# Patient Record
Sex: Male | Born: 1990 | Race: White | Hispanic: No | Marital: Married | Smoking: Former smoker
Health system: Southern US, Community
[De-identification: ages and names within clinical notes are randomized; demographics above are authoritative.]

## PROBLEM LIST (undated history)

## (undated) DIAGNOSIS — S82201B Unspecified fracture of shaft of right tibia, initial encounter for open fracture type I or II: Secondary | ICD-10-CM

## (undated) DIAGNOSIS — Z9289 Personal history of other medical treatment: Secondary | ICD-10-CM

## (undated) DIAGNOSIS — R569 Unspecified convulsions: Secondary | ICD-10-CM

## (undated) DIAGNOSIS — S7222XA Displaced subtrochanteric fracture of left femur, initial encounter for closed fracture: Secondary | ICD-10-CM

## (undated) DIAGNOSIS — E039 Hypothyroidism, unspecified: Secondary | ICD-10-CM

## (undated) DIAGNOSIS — S82202C Unspecified fracture of shaft of left tibia, initial encounter for open fracture type IIIA, IIIB, or IIIC: Secondary | ICD-10-CM

## (undated) HISTORY — DX: Hypothyroidism, unspecified: E03.9

---
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
## 2010-10-01 NOTE — Procedures (Signed)
PROCEDURE NUMBER:  [id number]   REFERRING PHYSICIAN:  Francoise Schaumann. Halm, DO, FAAP   CLINICAL HISTORY:  This is a routine EEG with photic stimulation and  hyperventilation.  The patient describes awake and drowsy period.  This  is 20 year old man with a single generalized seizure.  EEG is performed  for evaluation.   DESCRIPTION:  The dominant rhythm of this tracing is a low to moderate  amplitude alpha rhythm of 11 Hz which predominates posteriorly, appears  without abnormal asymmetry, and attenuates with eye opening and closing.  Low amplitude fast activity is seen frontally and centrally and appears  without abnormal asymmetry.  No focal slowing is noted and no  epileptiform discharges seen.  Drowsiness occurs naturally as evidenced  by fragmentation in the background and general attenuation of rhythms.  Stage II sleep is not achieved.  No abnormalities seen in drowsiness.  Photic stimulation produced symmetric driver responses best seen at 11  Hz.  Hyperventilation produced slowing and bill over the background  rhythms without appearance of epileptiform abnormalities.  Single-  channel devoted EKG revealed sinus rhythm throughout with a rate of  approximately 72 beats per minute.   CONCLUSIONS:  Normal study in the awake and drowsy states.      Michael L. Thad Ranger, M.D.  Electronically Signed     ZOX:WRUE  D:  07/01/2007 19:56:18  T:  07/03/2007 12:24:16  Job #:  [id number]

## 2012-06-06 ENCOUNTER — Encounter (HOSPITAL_COMMUNITY): Payer: Self-pay | Admitting: *Deleted

## 2012-06-06 ENCOUNTER — Emergency Department (HOSPITAL_COMMUNITY)
Admission: EM | Admit: 2012-06-06 | Discharge: 2012-06-06 | Disposition: A | Discharge status: Home / Self Care | Payer: Self-pay | Attending: Emergency Medicine | Admitting: Emergency Medicine

## 2012-06-06 ENCOUNTER — Other Ambulatory Visit (HOSPITAL_COMMUNITY): Payer: Self-pay

## 2012-06-06 ENCOUNTER — Emergency Department (HOSPITAL_COMMUNITY): Payer: Self-pay

## 2012-06-06 DIAGNOSIS — F17200 Nicotine dependence, unspecified, uncomplicated: Secondary | ICD-10-CM | POA: Insufficient documentation

## 2012-06-06 DIAGNOSIS — R55 Syncope and collapse: Secondary | ICD-10-CM | POA: Insufficient documentation

## 2012-06-06 DIAGNOSIS — G40909 Epilepsy, unspecified, not intractable, without status epilepticus: Secondary | ICD-10-CM | POA: Insufficient documentation

## 2012-06-06 HISTORY — DX: Unspecified convulsions: R56.9

## 2012-06-06 LAB — URINALYSIS, ROUTINE W REFLEX MICROSCOPIC
Bilirubin Urine: NEGATIVE
Glucose, UA: NEGATIVE mg/dL
Hgb urine dipstick: NEGATIVE
Ketones, ur: NEGATIVE mg/dL
Leukocytes, UA: NEGATIVE
Nitrite: NEGATIVE
Protein, ur: NEGATIVE mg/dL
Specific Gravity, Urine: 1.01 (ref 1.005–1.030)
Urobilinogen, UA: 0.2 mg/dL (ref 0.0–1.0)
pH: 5.5 (ref 5.0–8.0)

## 2012-06-06 LAB — BASIC METABOLIC PANEL
BUN: 11 mg/dL (ref 6–23)
CO2: 29 mEq/L (ref 19–32)
Calcium: 10 mg/dL (ref 8.4–10.5)
Chloride: 104 mEq/L (ref 96–112)
Creatinine, Ser: 0.95 mg/dL (ref 0.50–1.35)
GFR calc Af Amer: >90 mL/min (ref >90)
GFR calc non Af Amer: >90 mL/min (ref >90)
Glucose, Bld: 106 mg/dL — ABNORMAL HIGH (ref 70–99)
Potassium: 4 mEq/L (ref 3.5–5.1)
Sodium: 139 mEq/L (ref 135–145)

## 2012-06-06 LAB — CBC WITH DIFFERENTIAL/PLATELET
Basophils Absolute: 0 10*3/uL (ref 0.0–0.1)
Basophils Relative: 1 % (ref 0–1)
Eosinophils Absolute: 0.1 10*3/uL (ref 0.0–0.7)
Eosinophils Relative: 1 % (ref 0–5)
HCT: 40.5 % (ref 39.0–52.0)
Hemoglobin: 14.1 g/dL (ref 13.0–17.0)
Lymphocytes Relative: 27 % (ref 12–46)
Lymphs Abs: 1.2 10*3/uL (ref 0.7–4.0)
MCH: 30.1 pg (ref 26.0–34.0)
MCHC: 34.8 g/dL (ref 30.0–36.0)
MCV: 86.5 fL (ref 78.0–100.0)
Monocytes Absolute: 0.4 10*3/uL (ref 0.1–1.0)
Monocytes Relative: 9 % (ref 3–12)
Neutro Abs: 2.7 10*3/uL (ref 1.7–7.7)
Neutrophils Relative %: 62 % (ref 43–77)
Platelets: 166 10*3/uL (ref 150–400)
RBC: 4.68 MIL/uL (ref 4.22–5.81)
RDW: 12.5 % (ref 11.5–15.5)
WBC: 4.3 10*3/uL (ref 4.0–10.5)

## 2012-06-06 LAB — RAPID URINE DRUG SCREEN, HOSP PERFORMED
Amphetamines: NOT DETECTED
Barbiturates: NOT DETECTED
Benzodiazepines: NOT DETECTED
Cocaine: NOT DETECTED
Opiates: NOT DETECTED
Tetrahydrocannabinol: POSITIVE — AB

## 2012-06-06 LAB — D-DIMER, QUANTITATIVE (NOT AT ARMC): D-Dimer, Quant: <0.27 ug/mL-FEU (ref 0.00–0.48)

## 2012-06-06 LAB — TROPONIN I: Troponin I: <0.3 ng/mL (ref <0.30)

## 2012-06-06 NOTE — ED Provider Notes (Signed)
History     CSN: [id number]  Arrival date & time 06/06/12  1520   First MD Initiated Contact with Patient 06/06/12 1609      Chief Complaint  Patient presents with  . Near Syncope     HPI Pt was seen at 1635.   Per pt, c/o sudden onset and resolution of one brief episode of near syncope, followed by one brief episode of syncope early this morning approx 0200.  Pt states his symptoms occurred after he was "playing video games for a while."  States he initially felt "tired" and decided he needed to go to bed.  When arrived to the bedroom, states he "just fell down" on the bed and "heard my girlfriend talking to me" but states he "couldn't respond." Recalls all events of this episode.  Girlfriend states she helped him sit up on the floor and was talking to him when he "passed out again."  Pt states he does not remember the events of the second episode. Pt awoke on his own, A&O but has "felt really weak all day."  Pt's girlfriend denies seizure activity, no confusion/AMS, no incont of bowel/bladder, no apnea/pulselessness, no headache, no fevers, no CP/palpitations, no abd pain, no N/V/D, no focal motor weakness, no tingling/numbness in extremities, no slurred speech, no facial droop, no visual changes, no recent head injury.     Past Medical History  Diagnosis Date  . Seizures     x 1 at age 34    History reviewed. No pertinent past surgical history.   History  Substance Use Topics  . Smoking status: Current Every Day Smoker    Types: Cigarettes  . Smokeless tobacco: Not on file  . Alcohol Use: No      Review of Systems ROS: Statement: All systems negative except as marked or noted in the HPI; Constitutional: Negative for fever and chills. +generalized weakness/fatigue. ; ; Eyes: Negative for eye pain, redness and discharge. ; ; ENMT: Negative for ear pain, hoarseness, nasal congestion, sinus pressure and sore throat. ; ; Cardiovascular: Negative for chest pain, palpitations,  diaphoresis, dyspnea and peripheral edema. ; ; Respiratory: Negative for cough, wheezing and stridor. ; ; Gastrointestinal: Negative for nausea, vomiting, diarrhea, abdominal pain, blood in stool, hematemesis, jaundice and rectal bleeding. . ; ; Genitourinary: Negative for dysuria, flank pain and hematuria. ; ; Musculoskeletal: Negative for back pain and neck pain. Negative for swelling and trauma.; ; Skin: Negative for pruritus, rash, abrasions, blisters, bruising and skin lesion.; ; Neuro: +lightheadedness, near syncope, syncope. Negative for headache and neck stiffness. Negative for altered level of consciousness , altered mental status, extremity weakness, paresthesias, involuntary movement, seizure.       Allergies  Review of patient's allergies indicates no known allergies.  Home Medications  No current outpatient prescriptions on file.  BP 131/81  Pulse 77  Temp 97.5 F (36.4 C) (Oral)  Resp 20  Ht 5\' 5"  (1.651 m)  Wt 148 lb (67.132 kg)  BMI 24.63 kg/m2  SpO2 100%  Physical Exam 1640: Physical examination:  Nursing notes reviewed; Vital signs and O2 SAT reviewed;  Constitutional: Well developed, Well nourished, Well hydrated, In no acute distress; Head:  Normocephalic, atraumatic; Eyes: EOMI, PERRL, No scleral icterus; ENMT: Mouth and pharynx normal, Mucous membranes moist; Neck: Supple, Full range of motion, No lymphadenopathy; Cardiovascular: Regular rate and rhythm, No murmur, rub, or gallop; Respiratory: Breath sounds clear & equal bilaterally, No rales, rhonchi, wheezes.  Speaking full sentences with ease, Normal  respiratory effort/excursion; Chest: Nontender, Movement normal; Abdomen: Soft, Nontender, Nondistended, Normal bowel sounds; Genitourinary: No CVA tenderness; Extremities: Pulses normal, No tenderness, No edema, No calf edema or asymmetry.; Neuro: AA&Ox3, Major CN grossly intact.  Strength 5/5 equal bilat UE's and LE's.  DTR 2/4 equal bilat UE's and LE's.  No gross  sensory deficits. Speech clear.  No facial droop.  No nystagmus. Climbs on and off stretcher easily by himself. Gait steady.;;; Skin: Color normal, Warm, Dry.   ED Course  Procedures   MDM  MDM Reviewed: nursing note and vitals Interpretation: labs, ECG, x-ray and CT scan    Date: 06/06/2012  Rate: 69  Rhythm: normal sinus rhythm and sinus arrhythmia  QRS Axis: normal  Intervals: normal  ST/T Wave abnormalities: normal  Conduction Disutrbances:nonspecific intraventricular conduction delay  Narrative Interpretation:   Old EKG Reviewed: none available.  Results for orders placed during the hospital encounter of 06/06/12  BASIC METABOLIC PANEL      Component Value Range   Sodium 139  135 - 145 mEq/L   Potassium 4.0  3.5 - 5.1 mEq/L   Chloride 104  96 - 112 mEq/L   CO2 29  19 - 32 mEq/L   Glucose, Bld 106 (*) 70 - 99 mg/dL   BUN 11  6 - 23 mg/dL   Creatinine, Ser 1.47  0.50 - 1.35 mg/dL   Calcium 82.9  8.4 - 56.2 mg/dL   GFR calc non Af Amer >90  >90 mL/min   GFR calc Af Amer >90  >90 mL/min  CBC WITH DIFFERENTIAL      Component Value Range   WBC 4.3  4.0 - 10.5 K/uL   RBC 4.68  4.22 - 5.81 MIL/uL   Hemoglobin 14.1  13.0 - 17.0 g/dL   HCT 13.0  86.5 - 78.4 %   MCV 86.5  78.0 - 100.0 fL   MCH 30.1  26.0 - 34.0 pg   MCHC 34.8  30.0 - 36.0 g/dL   RDW 69.6  29.5 - 28.4 %   Platelets 166  150 - 400 K/uL   Neutrophils Relative 62  43 - 77 %   Neutro Abs 2.7  1.7 - 7.7 K/uL   Lymphocytes Relative 27  12 - 46 %   Lymphs Abs 1.2  0.7 - 4.0 K/uL   Monocytes Relative 9  3 - 12 %   Monocytes Absolute 0.4  0.1 - 1.0 K/uL   Eosinophils Relative 1  0 - 5 %   Eosinophils Absolute 0.1  0.0 - 0.7 K/uL   Basophils Relative 1  0 - 1 %   Basophils Absolute 0.0  0.0 - 0.1 K/uL  URINALYSIS, ROUTINE W REFLEX MICROSCOPIC      Component Value Range   Color, Urine YELLOW  YELLOW   APPearance CLEAR  CLEAR   Specific Gravity, Urine 1.010  1.005 - 1.030   pH 5.5  5.0 - 8.0   Glucose, UA  NEGATIVE  NEGATIVE mg/dL   Hgb urine dipstick NEGATIVE  NEGATIVE   Bilirubin Urine NEGATIVE  NEGATIVE   Ketones, ur NEGATIVE  NEGATIVE mg/dL   Protein, ur NEGATIVE  NEGATIVE mg/dL   Urobilinogen, UA 0.2  0.0 - 1.0 mg/dL   Nitrite NEGATIVE  NEGATIVE   Leukocytes, UA NEGATIVE  NEGATIVE  URINE RAPID DRUG SCREEN (HOSP PERFORMED)      Component Value Range   Opiates NONE DETECTED  NONE DETECTED   Cocaine NONE DETECTED  NONE DETECTED  Benzodiazepines NONE DETECTED  NONE DETECTED   Amphetamines NONE DETECTED  NONE DETECTED   Tetrahydrocannabinol POSITIVE (*) NONE DETECTED   Barbiturates NONE DETECTED  NONE DETECTED  TROPONIN I      Component Value Range   Troponin I <0.30  <0.30 ng/mL  D-DIMER, QUANTITATIVE      Component Value Range   D-Dimer, Quant <0.27  0.00 - 0.48 ug/mL-FEU   Dg Chest 2 View 06/06/2012  *RADIOLOGY REPORT*  Clinical Data: Chest pain and lightheaded.  CHEST - 2 VIEW  Comparison: None  Findings: The cardiac silhouette, mediastinal and hilar contours are normal.  The lungs are clear.  No pleural effusion.  The bony thorax is intact.  IMPRESSION: No acute cardiopulmonary findings.   Original Report Authenticated By: Rudie Meyer, M.D.    Ct Head Wo Contrast 06/06/2012  *RADIOLOGY REPORT*  Clinical Data: Near syncope  CT HEAD WITHOUT CONTRAST  Technique:  Contiguous axial images were obtained from the base of the skull through the vertex without contrast.  Comparison: MR brain 06/24/2007  Findings: Normal gray-white differentiation.  Normal ventricular size.  Negative for hemorrhage, hydrocephalus, mass effect, mass lesion, or evidence of acute cortically based infarction.  Soft tissues of the scalp and orbits are unremarkable.  The visualized paranasal sinuses, mastoid air cells, and middle ears are clear.  The skull is intact.  IMPRESSION: Normal head CT.   Original Report Authenticated By: Britta Mccreedy, M.D.     1910:  Not orthostatic.  No acute findings on workup today.  Initial episode appears near-syncope, followed by syncope.  Pt does have hx of seizure when he was 22 yrs old, but never followed up.  Events occurred approx 0200 this morning after "playing on playstation" for an extended period of time. Cautioned regarding playing video games for an extended period of time without a break. Verb understanding.  Dx and testing d/w pt and family.  Questions answered.  Verb understanding, agreeable to d/c home with outpt f/u with Neuro MD.             Laray Anger, DO 06/08/12 1353

## 2012-06-06 NOTE — ED Notes (Signed)
Pt left without signing out. Pt had spoken with EDP. No scripts for pt. Pt did not wait for discharge instructions.

## 2012-06-06 NOTE — ED Notes (Signed)
Girlfriend reports pt was coming to bed last night when pt had a near syncopal episode, falling on bed then rolling to floor.  Girlfriend states pt's eyes were open, skin was pale, and hands were clammy.  States pt would not respond for approx 30 sec. Episode happened twice last night.  Pt c/o being weak today.  Alert and oriented x 4 at this time.

## 2012-06-08 LAB — URINE CULTURE
Colony Count: NO GROWTH
Culture: NO GROWTH

## 2012-07-29 ENCOUNTER — Emergency Department (HOSPITAL_COMMUNITY)
Admission: EM | Admit: 2012-07-29 | Discharge: 2012-07-29 | Disposition: A | Discharge status: Home / Self Care | Payer: Self-pay | Attending: Emergency Medicine | Admitting: Emergency Medicine

## 2012-07-29 ENCOUNTER — Encounter (HOSPITAL_COMMUNITY): Payer: Self-pay | Admitting: *Deleted

## 2012-07-29 ENCOUNTER — Emergency Department (HOSPITAL_COMMUNITY): Payer: Self-pay

## 2012-07-29 DIAGNOSIS — R42 Dizziness and giddiness: Secondary | ICD-10-CM | POA: Insufficient documentation

## 2012-07-29 DIAGNOSIS — R197 Diarrhea, unspecified: Secondary | ICD-10-CM | POA: Insufficient documentation

## 2012-07-29 DIAGNOSIS — R5381 Other malaise: Secondary | ICD-10-CM | POA: Insufficient documentation

## 2012-07-29 DIAGNOSIS — E860 Dehydration: Secondary | ICD-10-CM | POA: Insufficient documentation

## 2012-07-29 DIAGNOSIS — Z8669 Personal history of other diseases of the nervous system and sense organs: Secondary | ICD-10-CM | POA: Insufficient documentation

## 2012-07-29 DIAGNOSIS — F17200 Nicotine dependence, unspecified, uncomplicated: Secondary | ICD-10-CM | POA: Insufficient documentation

## 2012-07-29 DIAGNOSIS — R112 Nausea with vomiting, unspecified: Secondary | ICD-10-CM | POA: Insufficient documentation

## 2012-07-29 DIAGNOSIS — R51 Headache: Secondary | ICD-10-CM | POA: Insufficient documentation

## 2012-07-29 DIAGNOSIS — R5383 Other fatigue: Secondary | ICD-10-CM | POA: Insufficient documentation

## 2012-07-29 LAB — CBC WITH DIFFERENTIAL/PLATELET
Basophils Absolute: 0 10*3/uL (ref 0.0–0.1)
Basophils Relative: 1 % (ref 0–1)
Eosinophils Absolute: 0 10*3/uL (ref 0.0–0.7)
Eosinophils Relative: 1 % (ref 0–5)
HCT: 45.8 % (ref 39.0–52.0)
Hemoglobin: 16 g/dL (ref 13.0–17.0)
Lymphocytes Relative: 28 % (ref 12–46)
Lymphs Abs: 1.4 10*3/uL (ref 0.7–4.0)
MCH: 29.8 pg (ref 26.0–34.0)
MCHC: 34.9 g/dL (ref 30.0–36.0)
MCV: 85.3 fL (ref 78.0–100.0)
Monocytes Absolute: 0.3 10*3/uL (ref 0.1–1.0)
Monocytes Relative: 7 % (ref 3–12)
Neutro Abs: 3.2 10*3/uL (ref 1.7–7.7)
Neutrophils Relative %: 64 % (ref 43–77)
Platelets: 186 10*3/uL (ref 150–400)
RBC: 5.37 MIL/uL (ref 4.22–5.81)
RDW: 12.8 % (ref 11.5–15.5)
WBC: 5 10*3/uL (ref 4.0–10.5)

## 2012-07-29 LAB — COMPREHENSIVE METABOLIC PANEL
ALT: 9 U/L (ref 0–53)
AST: 13 U/L (ref 0–37)
Albumin: 4.7 g/dL (ref 3.5–5.2)
Alkaline Phosphatase: 72 U/L (ref 39–117)
BUN: 10 mg/dL (ref 6–23)
CO2: 28 mEq/L (ref 19–32)
Calcium: 10.3 mg/dL (ref 8.4–10.5)
Chloride: 102 mEq/L (ref 96–112)
Creatinine, Ser: 1.03 mg/dL (ref 0.50–1.35)
GFR calc Af Amer: >90 mL/min (ref >90)
GFR calc non Af Amer: >90 mL/min (ref >90)
Glucose, Bld: 95 mg/dL (ref 70–99)
Potassium: 4.2 mEq/L (ref 3.5–5.1)
Sodium: 142 mEq/L (ref 135–145)
Total Bilirubin: 0.8 mg/dL (ref 0.3–1.2)
Total Protein: 7.8 g/dL (ref 6.0–8.3)

## 2012-07-29 LAB — OCCULT BLOOD, POC DEVICE: Fecal Occult Bld: NEGATIVE

## 2012-07-29 LAB — LIPASE, BLOOD: Lipase: 17 U/L (ref 11–59)

## 2012-07-29 MED ORDER — ONDANSETRON HCL 4 MG/2ML IJ SOLN
4.0000 mg | INTRAMUSCULAR | Status: DC | PRN
  Administered 2012-07-29: 4 mg via INTRAVENOUS
  Filled 2012-07-29: qty 2

## 2012-07-29 MED ORDER — ONDANSETRON HCL 4 MG PO TABS: 4.0000 mg | ORAL_TABLET | Freq: Three times a day (TID) | ORAL | Status: DC | PRN

## 2012-07-29 MED ORDER — SODIUM CHLORIDE 0.9 % IV BOLUS (SEPSIS)
1000.0000 mL | Freq: Once | INTRAVENOUS | Status: AC
  Administered 2012-07-29: 1000 mL via INTRAVENOUS

## 2012-07-29 MED ORDER — FAMOTIDINE IN NACL 20-0.9 MG/50ML-% IV SOLN
20.0000 mg | Freq: Once | INTRAVENOUS | Status: AC
  Administered 2012-07-29: 20 mg via INTRAVENOUS
  Filled 2012-07-29: qty 50

## 2012-07-29 MED ORDER — SODIUM CHLORIDE 0.9 % IV SOLN: INTRAVENOUS | Status: DC

## 2012-07-29 MED ORDER — SODIUM CHLORIDE 0.9 % IV BOLUS (SEPSIS): 1000.0000 mL | Freq: Once | INTRAVENOUS | Status: DC

## 2012-07-29 MED ORDER — ACETAMINOPHEN 325 MG PO TABS
ORAL_TABLET | ORAL | Status: AC
  Administered 2012-07-29: 325 mg
  Filled 2012-07-29: qty 2

## 2012-07-29 NOTE — ED Notes (Signed)
Po fluids provided and no complaints of nausea or vomiting.  Pt states that his nausea has passed, no further episodes of diarrhea.

## 2012-07-29 NOTE — ED Notes (Signed)
Pt states he is feeling more dizzy than before, MD made aware.  Denies n/v/d.

## 2012-07-29 NOTE — ED Notes (Signed)
Nausea, vomiting, diarrhea began suddenly aroung 8p.m. Sunday night.  Now is too weak to walk, SOB w/standing.  Unable to keep any food down.  Has been drinking a lot of Gatorade.

## 2012-07-29 NOTE — ED Provider Notes (Signed)
5:16 PM Recheck: Pt reports nausea is somewhat improved.  He states he is feeling better.  Pt is still somewhat lightheaded with standing.    Orthostatic VS are normal, will discharge.   Ward Givens, MD 07/29/12 [phone]

## 2012-07-29 NOTE — ED Provider Notes (Addendum)
History     CSN: [id number]  Arrival date & time 07/29/12  1310   First MD Initiated Contact with Patient 07/29/12 1333      Chief Complaint  Patient presents with  . Nausea  . Emesis  . Diarrhea  . Headache  . Weakness     HPI Pt was seen at 1340.  Per pt, c/o gradual onset and improvement of multiple intermittent episodes of N/V/D that began 4 days ago.   Describes the stools as "watery" but have "turned black" after he started taking Pepto Bismol.  Pt states he has not vomited today and the amount of stools/day is decreasing. Has been associated with feeling "lightheaded" esp when me moves from laying to standing.  Denies abd pain, no CP/SOB, no back pain, no fevers, no blood in stools, no black or blood in emesis.     Past Medical History  Diagnosis Date  . Seizures     x 1 at age 42    History reviewed. No pertinent past surgical history.    History  Substance Use Topics  . Smoking status: Current Every Day Smoker    Types: Cigarettes  . Smokeless tobacco: Not on file  . Alcohol Use: No      Review of Systems ROS: Statement: All systems negative except as marked or noted in the HPI; Constitutional: Negative for fever and chills. ; ; Eyes: Negative for eye pain, redness and discharge. ; ; ENMT: Negative for ear pain, hoarseness, nasal congestion, sinus pressure and sore throat. ; ; Cardiovascular: Negative for chest pain, palpitations, diaphoresis, dyspnea and peripheral edema. ; ; Respiratory: Negative for cough, wheezing and stridor. ; ; Gastrointestinal: +N/V/D. Negative for abdominal pain, blood in stool, hematemesis, jaundice and rectal bleeding. . ; ; Genitourinary: Negative for dysuria, flank pain and hematuria. ; ; Musculoskeletal: Negative for back pain and neck pain. Negative for swelling and trauma.; ; Skin: Negative for pruritus, rash, abrasions, blisters, bruising and skin lesion.; ; Neuro: +lightheadedness. Negative for headache and neck stiffness.  Negative for weakness, altered level of consciousness , altered mental status, extremity weakness, paresthesias, involuntary movement, seizure and syncope.       Allergies  Review of patient's allergies indicates no known allergies.  Home Medications   Current Outpatient Rx  Name  Route  Sig  Dispense  Refill  . bismuth subsalicylate (PEPTO BISMOL) 262 MG chewable tablet   Oral   Chew 262 mg by mouth daily as needed for indigestion.         . bismuth subsalicylate (PEPTO BISMOL) 262 MG/15ML suspension   Oral   Take 15 mLs by mouth every 6 (six) hours as needed for indigestion.         Marland Kitchen PRESCRIPTION MEDICATION   Oral   Take 1 tablet by mouth every 8 (eight) hours as needed (nausea).           BP 105/62  Pulse 61  Temp(Src) 97.7 F (36.5 C) (Oral)  Resp 18  Ht 5\' 4"  (1.626 m)  Wt 140 lb (63.504 kg)  BMI 24.02 kg/m2  SpO2 100%  Physical Exam 1345: Physical examination:  Nursing notes reviewed; Vital signs and O2 SAT reviewed;  Constitutional: Well developed, Well nourished, Well hydrated, In no acute distress; Head:  Normocephalic, atraumatic; Eyes: EOMI, PERRL, No scleral icterus; ENMT: Mouth and pharynx normal, Mucous membranes moist; Neck: Supple, Full range of motion, No lymphadenopathy; Cardiovascular: Regular rate and rhythm, No murmur, rub, or gallop; Respiratory: Breath  sounds clear & equal bilaterally, No rales, rhonchi, wheezes.  Speaking full sentences with ease, Normal respiratory effort/excursion; Chest: Nontender, Movement normal; Abdomen: Soft, Nontender, Nondistended, Normal bowel sounds. Rectal exam performed w/permission of pt and ED RN chaparone present.  Anal tone normal.  Non-tender, soft black stool in rectal vault, heme neg.  No fissures, no external hemorrhoids, no palp masses.; Genitourinary: No CVA tenderness; Extremities: Pulses normal, No tenderness, No edema, No calf edema or asymmetry.; Neuro: AA&Ox3, Major CN grossly intact.  Speech clear.  Climbs on and off stretcher easily by himself. Gait steady. No gross focal motor or sensory deficits in extremities.; Skin: Color normal, Warm, Dry.   ED Course  Procedures    MDM  MDM Reviewed: previous chart, nursing note and vitals Interpretation: labs and x-ray   Results for orders placed during the hospital encounter of 07/29/12  CBC WITH DIFFERENTIAL      Result Value Range   WBC 5.0  4.0 - 10.5 K/uL   RBC 5.37  4.22 - 5.81 MIL/uL   Hemoglobin 16.0  13.0 - 17.0 g/dL   HCT 16.1  09.6 - 04.5 %   MCV 85.3  78.0 - 100.0 fL   MCH 29.8  26.0 - 34.0 pg   MCHC 34.9  30.0 - 36.0 g/dL   RDW 40.9  81.1 - 91.4 %   Platelets 186  150 - 400 K/uL   Neutrophils Relative 64  43 - 77 %   Neutro Abs 3.2  1.7 - 7.7 K/uL   Lymphocytes Relative 28  12 - 46 %   Lymphs Abs 1.4  0.7 - 4.0 K/uL   Monocytes Relative 7  3 - 12 %   Monocytes Absolute 0.3  0.1 - 1.0 K/uL   Eosinophils Relative 1  0 - 5 %   Eosinophils Absolute 0.0  0.0 - 0.7 K/uL   Basophils Relative 1  0 - 1 %   Basophils Absolute 0.0  0.0 - 0.1 K/uL  COMPREHENSIVE METABOLIC PANEL      Result Value Range   Sodium 142  135 - 145 mEq/L   Potassium 4.2  3.5 - 5.1 mEq/L   Chloride 102  96 - 112 mEq/L   CO2 28  19 - 32 mEq/L   Glucose, Bld 95  70 - 99 mg/dL   BUN 10  6 - 23 mg/dL   Creatinine, Ser 7.82  0.50 - 1.35 mg/dL   Calcium 95.6  8.4 - 21.3 mg/dL   Total Protein 7.8  6.0 - 8.3 g/dL   Albumin 4.7  3.5 - 5.2 g/dL   AST 13  0 - 37 U/L   ALT 9  0 - 53 U/L   Alkaline Phosphatase 72  39 - 117 U/L   Total Bilirubin 0.8  0.3 - 1.2 mg/dL   GFR calc non Af Amer >90  >90 mL/min   GFR calc Af Amer >90  >90 mL/min  LIPASE, BLOOD      Result Value Range   Lipase 17  11 - 59 U/L  OCCULT BLOOD, POC DEVICE      Result Value Range   Fecal Occult Bld NEGATIVE  NEGATIVE   Dg Abd Acute W/chest 07/29/2012  *RADIOLOGY REPORT*  Clinical Data: Abdominal pain, nausea.  ACUTE ABDOMEN SERIES (ABDOMEN 2 VIEW & CHEST 1 VIEW)  Comparison:  06/06/2012  Findings: The bowel gas pattern is normal.  There is no evidence of free intraperitoneal air.  No suspicious radio-opaque calculi or  other significant radiographic abnormality is seen. Heart size and mediastinal contours are within normal limits.  Both lungs are clear.  IMPRESSION: No acute findings.   Original Report Authenticated By: Charlett Nose, M.D.       1600:   Workup without acute abnl.  Pt has tol PO well while in the ED without N/V.  No stooling while in the ED.  Abd remains benign. Pt is orthostatic on VS.  Will give another 1L NS IV and reassess.  Pt now endorses that he "took a friend's phenergan" for nausea yesterday.  Cautioned regarding taking other people's medications.  Verb understanding.  Sign out to Dr. Lynelle Doctor.         Laray Anger, DO 07/29/12 [phone]

## 2012-07-29 NOTE — ED Notes (Signed)
Patient states that he started having nausea, vomiting and diarrhea last Sunday.  States that today he started feeling very weak and dizzy with the N/V/D continuing.  States he has been taking Pepto Bismol with some improvement in the N/V/D, however, his symptoms continue.  Mucous membranes remain moist, pt states adequate urine output.  Pt appears in no acute distress at present, no active vomiting.

## 2012-07-30 ENCOUNTER — Emergency Department (HOSPITAL_COMMUNITY)
Admission: EM | Admit: 2012-07-30 | Discharge: 2012-07-30 | Disposition: A | Discharge status: Home / Self Care | Payer: Self-pay | Attending: Emergency Medicine | Admitting: Emergency Medicine

## 2012-07-30 ENCOUNTER — Encounter (HOSPITAL_COMMUNITY): Payer: Self-pay | Admitting: *Deleted

## 2012-07-30 DIAGNOSIS — R6883 Chills (without fever): Secondary | ICD-10-CM | POA: Insufficient documentation

## 2012-07-30 DIAGNOSIS — K529 Noninfective gastroenteritis and colitis, unspecified: Secondary | ICD-10-CM

## 2012-07-30 DIAGNOSIS — R112 Nausea with vomiting, unspecified: Secondary | ICD-10-CM | POA: Insufficient documentation

## 2012-07-30 DIAGNOSIS — B349 Viral infection, unspecified: Secondary | ICD-10-CM

## 2012-07-30 DIAGNOSIS — R42 Dizziness and giddiness: Secondary | ICD-10-CM

## 2012-07-30 DIAGNOSIS — R51 Headache: Secondary | ICD-10-CM | POA: Insufficient documentation

## 2012-07-30 DIAGNOSIS — R5383 Other fatigue: Secondary | ICD-10-CM | POA: Insufficient documentation

## 2012-07-30 DIAGNOSIS — R5381 Other malaise: Secondary | ICD-10-CM | POA: Insufficient documentation

## 2012-07-30 DIAGNOSIS — K5289 Other specified noninfective gastroenteritis and colitis: Secondary | ICD-10-CM | POA: Insufficient documentation

## 2012-07-30 DIAGNOSIS — B9789 Other viral agents as the cause of diseases classified elsewhere: Secondary | ICD-10-CM | POA: Insufficient documentation

## 2012-07-30 DIAGNOSIS — Z79899 Other long term (current) drug therapy: Secondary | ICD-10-CM | POA: Insufficient documentation

## 2012-07-30 DIAGNOSIS — R109 Unspecified abdominal pain: Secondary | ICD-10-CM | POA: Insufficient documentation

## 2012-07-30 DIAGNOSIS — R197 Diarrhea, unspecified: Secondary | ICD-10-CM | POA: Insufficient documentation

## 2012-07-30 DIAGNOSIS — Z8669 Personal history of other diseases of the nervous system and sense organs: Secondary | ICD-10-CM | POA: Insufficient documentation

## 2012-07-30 DIAGNOSIS — F17200 Nicotine dependence, unspecified, uncomplicated: Secondary | ICD-10-CM | POA: Insufficient documentation

## 2012-07-30 MED ORDER — LOPERAMIDE HCL 2 MG PO TABS: 2.0000 mg | ORAL_TABLET | Freq: Four times a day (QID) | ORAL | Status: DC | PRN

## 2012-07-30 MED ORDER — MECLIZINE HCL 25 MG PO TABS: 25.0000 mg | ORAL_TABLET | Freq: Four times a day (QID) | ORAL | Status: DC

## 2012-07-30 MED ORDER — PROMETHAZINE HCL 12.5 MG PO TABS: 12.5000 mg | ORAL_TABLET | Freq: Four times a day (QID) | ORAL | Status: DC | PRN

## 2012-07-30 NOTE — ED Notes (Signed)
Dr. Zackowski at bedside  

## 2012-07-30 NOTE — ED Provider Notes (Signed)
History     CSN: [id number]  Arrival date & time 07/30/12  1210   First MD Initiated Contact with Patient 07/30/12 1404      Chief Complaint  Patient presents with  . Dizziness  . Headache    (Consider location/radiation/quality/duration/timing/severity/associated sxs/prior treatment) Patient is a 22 y.o. male presenting with headaches. The history is provided by the patient.  Headache Associated symptoms: abdominal pain, diarrhea, dizziness, fatigue, nausea and vomiting   Associated symptoms: no back pain, no congestion and no fever    patient seen yesterday in the emergency department. The patient at the beginning of the week had the dizziness lightheadedness mild headache and fatigue. Starting 2 days ago he started with vomiting and diarrhea. Vomiting has now resolved diarrhea has persisted. The dizziness which is true room spinning consistent with vertigo has improved but has not resolved. Complains of headache today but his had a headache all week the headache is not severe.. no blood in the bowel movements no blood in the vomit. Chills but no fever.  Past Medical History  Diagnosis Date  . Seizures     x 1 at age 71    History reviewed. No pertinent past surgical history.  No family history on file.  History  Substance Use Topics  . Smoking status: Current Every Day Smoker    Types: Cigarettes  . Smokeless tobacco: Not on file  . Alcohol Use: No      Review of Systems  Constitutional: Positive for chills and fatigue. Negative for fever.  HENT: Negative for congestion.   Respiratory: Negative for shortness of breath.   Cardiovascular: Negative for chest pain.  Gastrointestinal: Positive for nausea, vomiting, abdominal pain and diarrhea. Negative for blood in stool and anal bleeding.  Musculoskeletal: Negative for back pain.  Neurological: Positive for dizziness, light-headedness and headaches.  Hematological: Does not bruise/bleed easily.   Psychiatric/Behavioral: Negative for confusion.    Allergies  Review of patient's allergies indicates no known allergies.  Home Medications   Current Outpatient Rx  Name  Route  Sig  Dispense  Refill  . bismuth subsalicylate (PEPTO BISMOL) 262 MG/15ML suspension   Oral   Take 15 mLs by mouth every 6 (six) hours as needed for indigestion.         Marland Kitchen loperamide (IMODIUM A-D) 2 MG tablet   Oral   Take 1 tablet (2 mg total) by mouth 4 (four) times daily as needed for diarrhea or loose stools.   30 tablet   0   . meclizine (ANTIVERT) 25 MG tablet   Oral   Take 1 tablet (25 mg total) by mouth 4 (four) times daily.   28 tablet   0   . ondansetron (ZOFRAN) 4 MG tablet   Oral   Take 1 tablet (4 mg total) by mouth every 8 (eight) hours as needed for nausea.   6 tablet   0   . promethazine (PHENERGAN) 12.5 MG tablet   Oral   Take 1 tablet (12.5 mg total) by mouth every 6 (six) hours as needed for nausea.   30 tablet   0     BP 114/73  Pulse 72  Temp(Src) 97.9 F (36.6 C) (Oral)  Resp 16  Ht 5\' 4"  (1.626 m)  Wt 140 lb (63.504 kg)  BMI 24.02 kg/m2  SpO2 100%  Physical Exam  Nursing note and vitals reviewed. Constitutional: He is oriented to person, place, and time. He appears well-developed and well-nourished.  HENT:  Head:  Normocephalic and atraumatic.  Mouth/Throat: Oropharynx is clear and moist.  Eyes: Conjunctivae and EOM are normal. Pupils are equal, round, and reactive to light.  Neck: Normal range of motion. Neck supple.  Cardiovascular: Normal rate and regular rhythm.   No murmur heard. Pulmonary/Chest: Effort normal and breath sounds normal.  Abdominal: Soft. Bowel sounds are normal. There is no tenderness.  Musculoskeletal: Normal range of motion. He exhibits no edema.  Neurological: He is alert and oriented to person, place, and time. No cranial nerve deficit. He exhibits normal muscle tone. Coordination normal.  Skin: Skin is warm. No rash noted.     ED Course  Procedures (including critical care time)  Labs Reviewed - No data to display Dg Abd Acute W/chest  07/29/2012  *RADIOLOGY REPORT*  Clinical Data: Abdominal pain, nausea.  ACUTE ABDOMEN SERIES (ABDOMEN 2 VIEW & CHEST 1 VIEW)  Comparison: 06/06/2012  Findings: The bowel gas pattern is normal.  There is no evidence of free intraperitoneal air.  No suspicious radio-opaque calculi or other significant radiographic abnormality is seen. Heart size and mediastinal contours are within normal limits.  Both lungs are clear.  IMPRESSION: No acute findings.   Original Report Authenticated By: Charlett Nose, M.D.      1. Viral syndrome   2. Gastroenteritis   3. Vertigo       MDM  Reviewed patient's lab work from yesterday. Patient's symptoms seem to be consistent with a viral type illness. Component of facet gastroenteritis other components is a vertigo-type dizziness. Patient's dizziness is improving. Has not had any further vomiting. Diarrhea has persisted. No blood in the diarrhea. No fever here no leukocytosis yesterday. Patient nontoxic no acute distress. Will treat symptomatically with Antivert for the dizziness which is a vertigo-type dizziness Phenergan for the nausea and vomiting and Imodium right ear for the diarrhea work note provided and he will return if worse or if not improved by Monday.        Shelda Jakes, MD 07/30/12 [phone]

## 2012-07-30 NOTE — ED Notes (Signed)
Patient lying in bed. No distress. Respirations even and unlabored.  

## 2012-07-30 NOTE — ED Notes (Signed)
Pt seen here yesterday. Dx with dehydration. Denies vomiting today. States diarrhea continues. Dizziness and migraine today.

## 2012-07-31 ENCOUNTER — Encounter (HOSPITAL_COMMUNITY): Payer: Self-pay

## 2012-07-31 ENCOUNTER — Emergency Department (HOSPITAL_COMMUNITY)
Admission: EM | Admit: 2012-07-31 | Discharge: 2012-07-31 | Disposition: A | Discharge status: Home / Self Care | Payer: Self-pay | Attending: Emergency Medicine | Admitting: Emergency Medicine

## 2012-07-31 ENCOUNTER — Emergency Department (HOSPITAL_COMMUNITY): Payer: Self-pay

## 2012-07-31 DIAGNOSIS — R209 Unspecified disturbances of skin sensation: Secondary | ICD-10-CM | POA: Insufficient documentation

## 2012-07-31 DIAGNOSIS — R197 Diarrhea, unspecified: Secondary | ICD-10-CM | POA: Insufficient documentation

## 2012-07-31 DIAGNOSIS — Z8669 Personal history of other diseases of the nervous system and sense organs: Secondary | ICD-10-CM | POA: Insufficient documentation

## 2012-07-31 DIAGNOSIS — F17200 Nicotine dependence, unspecified, uncomplicated: Secondary | ICD-10-CM | POA: Insufficient documentation

## 2012-07-31 DIAGNOSIS — R42 Dizziness and giddiness: Secondary | ICD-10-CM | POA: Insufficient documentation

## 2012-07-31 DIAGNOSIS — R1110 Vomiting, unspecified: Secondary | ICD-10-CM | POA: Insufficient documentation

## 2012-07-31 DIAGNOSIS — R064 Hyperventilation: Secondary | ICD-10-CM | POA: Insufficient documentation

## 2012-07-31 MED ORDER — HYDROXYZINE HCL 25 MG PO TABS: 25.0000 mg | ORAL_TABLET | Freq: Four times a day (QID) | ORAL | Status: DC
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
## 2012-07-31 NOTE — ED Notes (Signed)
Pt reports taking OTC goody powder prior to cp beginning.

## 2012-07-31 NOTE — ED Notes (Signed)
Pt complains of chest pain. Pt states he has trouble breathing only when standing. Has been at [facility] for the past two days and was given phenergan but no relief. Pt has vomited x1 this morning and diarrhea.

## 2013-10-03 ENCOUNTER — Other Ambulatory Visit: Payer: Self-pay | Admitting: Ophthalmology

## 2013-10-03 DIAGNOSIS — H5500 Unspecified nystagmus: Secondary | ICD-10-CM

## 2013-10-06 ENCOUNTER — Ambulatory Visit
Admission: RE | Admit: 2013-10-06 | Discharge: 2013-10-06 | Disposition: A | Discharge status: Home / Self Care | Payer: 59 | Source: Ambulatory Visit | Attending: Ophthalmology | Admitting: Ophthalmology

## 2013-10-06 DIAGNOSIS — H5500 Unspecified nystagmus: Secondary | ICD-10-CM

## 2013-10-06 MED ORDER — GADOBENATE DIMEGLUMINE 529 MG/ML IV SOLN
13.0000 mL | Freq: Once | INTRAVENOUS | Status: AC | PRN
  Administered 2013-10-06: 13 mL via INTRAVENOUS

## 2014-05-19 DIAGNOSIS — S069XAA Unspecified intracranial injury with loss of consciousness status unknown, initial encounter: Secondary | ICD-10-CM

## 2014-05-19 HISTORY — DX: Unspecified intracranial injury with loss of consciousness status unknown, initial encounter: S06.9XAA

## 2015-01-06 ENCOUNTER — Emergency Department (HOSPITAL_COMMUNITY): Payer: 59 | Admitting: Certified Registered Nurse Anesthetist

## 2015-01-06 ENCOUNTER — Encounter (HOSPITAL_COMMUNITY): Admission: EM | Disposition: A | Discharge status: Rehabilitation Facility | Payer: Self-pay | Source: Home / Self Care

## 2015-01-06 ENCOUNTER — Encounter (HOSPITAL_COMMUNITY): Payer: Self-pay | Admitting: Radiology

## 2015-01-06 ENCOUNTER — Inpatient Hospital Stay (HOSPITAL_COMMUNITY)
Admission: EM | Admit: 2015-01-06 | Discharge: 2015-02-13 | DRG: 003 | Disposition: A | Discharge status: Rehabilitation Facility | Payer: 59 | Attending: General Surgery | Admitting: General Surgery

## 2015-01-06 ENCOUNTER — Inpatient Hospital Stay (HOSPITAL_COMMUNITY): Payer: 59

## 2015-01-06 ENCOUNTER — Emergency Department (HOSPITAL_COMMUNITY): Payer: 59

## 2015-01-06 DIAGNOSIS — N508 Other specified disorders of male genital organs: Secondary | ICD-10-CM | POA: Diagnosis not present

## 2015-01-06 DIAGNOSIS — T791XXD Fat embolism (traumatic), subsequent encounter: Secondary | ICD-10-CM | POA: Diagnosis not present

## 2015-01-06 DIAGNOSIS — S32401A Unspecified fracture of right acetabulum, initial encounter for closed fracture: Secondary | ICD-10-CM | POA: Diagnosis present

## 2015-01-06 DIAGNOSIS — S81812A Laceration without foreign body, left lower leg, initial encounter: Secondary | ICD-10-CM | POA: Diagnosis present

## 2015-01-06 DIAGNOSIS — S82252C Displaced comminuted fracture of shaft of left tibia, initial encounter for open fracture type IIIA, IIIB, or IIIC: Secondary | ICD-10-CM | POA: Diagnosis not present

## 2015-01-06 DIAGNOSIS — J9690 Respiratory failure, unspecified, unspecified whether with hypoxia or hypercapnia: Secondary | ICD-10-CM

## 2015-01-06 DIAGNOSIS — Z419 Encounter for procedure for purposes other than remedying health state, unspecified: Secondary | ICD-10-CM

## 2015-01-06 DIAGNOSIS — T791XXA Fat embolism (traumatic), initial encounter: Secondary | ICD-10-CM

## 2015-01-06 DIAGNOSIS — R739 Hyperglycemia, unspecified: Secondary | ICD-10-CM | POA: Diagnosis present

## 2015-01-06 DIAGNOSIS — R4020 Unspecified coma: Secondary | ICD-10-CM | POA: Diagnosis not present

## 2015-01-06 DIAGNOSIS — S36113A Laceration of liver, unspecified degree, initial encounter: Secondary | ICD-10-CM | POA: Diagnosis present

## 2015-01-06 DIAGNOSIS — J930 Spontaneous tension pneumothorax: Secondary | ICD-10-CM | POA: Diagnosis present

## 2015-01-06 DIAGNOSIS — T791XXS Fat embolism (traumatic), sequela: Secondary | ICD-10-CM | POA: Diagnosis not present

## 2015-01-06 DIAGNOSIS — S2232XA Fracture of one rib, left side, initial encounter for closed fracture: Secondary | ICD-10-CM | POA: Diagnosis present

## 2015-01-06 DIAGNOSIS — S81011A Laceration without foreign body, right knee, initial encounter: Secondary | ICD-10-CM | POA: Diagnosis present

## 2015-01-06 DIAGNOSIS — J189 Pneumonia, unspecified organism: Secondary | ICD-10-CM | POA: Diagnosis not present

## 2015-01-06 DIAGNOSIS — S32119A Unspecified Zone I fracture of sacrum, initial encounter for closed fracture: Secondary | ICD-10-CM | POA: Diagnosis present

## 2015-01-06 DIAGNOSIS — G9349 Other encephalopathy: Secondary | ICD-10-CM | POA: Diagnosis present

## 2015-01-06 DIAGNOSIS — G936 Cerebral edema: Secondary | ICD-10-CM | POA: Diagnosis present

## 2015-01-06 DIAGNOSIS — R1314 Dysphagia, pharyngoesophageal phase: Secondary | ICD-10-CM | POA: Diagnosis not present

## 2015-01-06 DIAGNOSIS — D689 Coagulation defect, unspecified: Secondary | ICD-10-CM | POA: Diagnosis not present

## 2015-01-06 DIAGNOSIS — R259 Unspecified abnormal involuntary movements: Secondary | ICD-10-CM | POA: Diagnosis not present

## 2015-01-06 DIAGNOSIS — J9600 Acute respiratory failure, unspecified whether with hypoxia or hypercapnia: Secondary | ICD-10-CM | POA: Diagnosis present

## 2015-01-06 DIAGNOSIS — D62 Acute posthemorrhagic anemia: Secondary | ICD-10-CM | POA: Diagnosis not present

## 2015-01-06 DIAGNOSIS — Z9889 Other specified postprocedural states: Secondary | ICD-10-CM

## 2015-01-06 DIAGNOSIS — S82452C Displaced comminuted fracture of shaft of left fibula, initial encounter for open fracture type IIIA, IIIB, or IIIC: Secondary | ICD-10-CM | POA: Diagnosis present

## 2015-01-06 DIAGNOSIS — S32018A Other fracture of first lumbar vertebra, initial encounter for closed fracture: Secondary | ICD-10-CM | POA: Diagnosis present

## 2015-01-06 DIAGNOSIS — S32591A Other specified fracture of right pubis, initial encounter for closed fracture: Secondary | ICD-10-CM | POA: Diagnosis present

## 2015-01-06 DIAGNOSIS — R509 Fever, unspecified: Secondary | ICD-10-CM

## 2015-01-06 DIAGNOSIS — R1310 Dysphagia, unspecified: Secondary | ICD-10-CM | POA: Diagnosis not present

## 2015-01-06 DIAGNOSIS — S32019A Unspecified fracture of first lumbar vertebra, initial encounter for closed fracture: Secondary | ICD-10-CM | POA: Diagnosis present

## 2015-01-06 DIAGNOSIS — S7222XA Displaced subtrochanteric fracture of left femur, initial encounter for closed fracture: Secondary | ICD-10-CM

## 2015-01-06 DIAGNOSIS — G8250 Quadriplegia, unspecified: Secondary | ICD-10-CM | POA: Diagnosis not present

## 2015-01-06 DIAGNOSIS — S82251B Displaced comminuted fracture of shaft of right tibia, initial encounter for open fracture type I or II: Secondary | ICD-10-CM | POA: Diagnosis present

## 2015-01-06 DIAGNOSIS — S060X9A Concussion with loss of consciousness of unspecified duration, initial encounter: Secondary | ICD-10-CM | POA: Diagnosis present

## 2015-01-06 DIAGNOSIS — J9811 Atelectasis: Secondary | ICD-10-CM | POA: Diagnosis not present

## 2015-01-06 DIAGNOSIS — R7881 Bacteremia: Secondary | ICD-10-CM | POA: Diagnosis not present

## 2015-01-06 DIAGNOSIS — S82451B Displaced comminuted fracture of shaft of right fibula, initial encounter for open fracture type I or II: Secondary | ICD-10-CM | POA: Diagnosis present

## 2015-01-06 DIAGNOSIS — S3282XA Multiple fractures of pelvis without disruption of pelvic ring, initial encounter for closed fracture: Secondary | ICD-10-CM | POA: Diagnosis present

## 2015-01-06 DIAGNOSIS — J939 Pneumothorax, unspecified: Secondary | ICD-10-CM

## 2015-01-06 DIAGNOSIS — G259 Extrapyramidal and movement disorder, unspecified: Secondary | ICD-10-CM | POA: Diagnosis not present

## 2015-01-06 DIAGNOSIS — R569 Unspecified convulsions: Secondary | ICD-10-CM

## 2015-01-06 DIAGNOSIS — S40812A Abrasion of left upper arm, initial encounter: Secondary | ICD-10-CM | POA: Diagnosis present

## 2015-01-06 DIAGNOSIS — Z978 Presence of other specified devices: Secondary | ICD-10-CM

## 2015-01-06 DIAGNOSIS — S72352B Displaced comminuted fracture of shaft of left femur, initial encounter for open fracture type I or II: Secondary | ICD-10-CM | POA: Diagnosis present

## 2015-01-06 DIAGNOSIS — S069X4S Unspecified intracranial injury with loss of consciousness of 6 hours to 24 hours, sequela: Secondary | ICD-10-CM | POA: Diagnosis not present

## 2015-01-06 DIAGNOSIS — F1210 Cannabis abuse, uncomplicated: Secondary | ICD-10-CM | POA: Diagnosis present

## 2015-01-06 DIAGNOSIS — J40 Bronchitis, not specified as acute or chronic: Secondary | ICD-10-CM | POA: Diagnosis not present

## 2015-01-06 DIAGNOSIS — S32028A Other fracture of second lumbar vertebra, initial encounter for closed fracture: Secondary | ICD-10-CM | POA: Diagnosis present

## 2015-01-06 DIAGNOSIS — B957 Other staphylococcus as the cause of diseases classified elsewhere: Secondary | ICD-10-CM | POA: Diagnosis not present

## 2015-01-06 DIAGNOSIS — J90 Pleural effusion, not elsewhere classified: Secondary | ICD-10-CM | POA: Diagnosis not present

## 2015-01-06 DIAGNOSIS — T1490XA Injury, unspecified, initial encounter: Secondary | ICD-10-CM

## 2015-01-06 DIAGNOSIS — S32009A Unspecified fracture of unspecified lumbar vertebra, initial encounter for closed fracture: Secondary | ICD-10-CM | POA: Diagnosis present

## 2015-01-06 DIAGNOSIS — S27322A Contusion of lung, bilateral, initial encounter: Secondary | ICD-10-CM | POA: Diagnosis present

## 2015-01-06 DIAGNOSIS — S36898A Other injury of other intra-abdominal organs, initial encounter: Secondary | ICD-10-CM | POA: Diagnosis present

## 2015-01-06 DIAGNOSIS — Z01818 Encounter for other preprocedural examination: Secondary | ICD-10-CM

## 2015-01-06 DIAGNOSIS — S7222XS Displaced subtrochanteric fracture of left femur, sequela: Secondary | ICD-10-CM | POA: Diagnosis not present

## 2015-01-06 DIAGNOSIS — S72302A Unspecified fracture of shaft of left femur, initial encounter for closed fracture: Secondary | ICD-10-CM

## 2015-01-06 DIAGNOSIS — S3210XA Unspecified fracture of sacrum, initial encounter for closed fracture: Secondary | ICD-10-CM | POA: Diagnosis present

## 2015-01-06 DIAGNOSIS — S272XXA Traumatic hemopneumothorax, initial encounter: Secondary | ICD-10-CM | POA: Diagnosis present

## 2015-01-06 DIAGNOSIS — Y95 Nosocomial condition: Secondary | ICD-10-CM | POA: Diagnosis not present

## 2015-01-06 DIAGNOSIS — S3282XS Multiple fractures of pelvis without disruption of pelvic ring, sequela: Secondary | ICD-10-CM | POA: Diagnosis not present

## 2015-01-06 DIAGNOSIS — I959 Hypotension, unspecified: Secondary | ICD-10-CM | POA: Diagnosis present

## 2015-01-06 DIAGNOSIS — D696 Thrombocytopenia, unspecified: Secondary | ICD-10-CM | POA: Diagnosis not present

## 2015-01-06 DIAGNOSIS — K661 Hemoperitoneum: Secondary | ICD-10-CM | POA: Diagnosis present

## 2015-01-06 DIAGNOSIS — S82201B Unspecified fracture of shaft of right tibia, initial encounter for open fracture type I or II: Secondary | ICD-10-CM

## 2015-01-06 DIAGNOSIS — S32402A Unspecified fracture of left acetabulum, initial encounter for closed fracture: Secondary | ICD-10-CM | POA: Diagnosis present

## 2015-01-06 DIAGNOSIS — S3282XD Multiple fractures of pelvis without disruption of pelvic ring, subsequent encounter for fracture with routine healing: Secondary | ICD-10-CM | POA: Diagnosis not present

## 2015-01-06 DIAGNOSIS — M79605 Pain in left leg: Secondary | ICD-10-CM | POA: Diagnosis present

## 2015-01-06 DIAGNOSIS — S82202C Unspecified fracture of shaft of left tibia, initial encounter for open fracture type IIIA, IIIB, or IIIC: Secondary | ICD-10-CM

## 2015-01-06 DIAGNOSIS — E861 Hypovolemia: Secondary | ICD-10-CM | POA: Diagnosis not present

## 2015-01-06 DIAGNOSIS — S40811A Abrasion of right upper arm, initial encounter: Secondary | ICD-10-CM | POA: Diagnosis present

## 2015-01-06 DIAGNOSIS — S36899A Unspecified injury of other intra-abdominal organs, initial encounter: Secondary | ICD-10-CM | POA: Diagnosis present

## 2015-01-06 DIAGNOSIS — S299XXA Unspecified injury of thorax, initial encounter: Secondary | ICD-10-CM

## 2015-01-06 DIAGNOSIS — Z452 Encounter for adjustment and management of vascular access device: Secondary | ICD-10-CM

## 2015-01-06 DIAGNOSIS — Z87828 Personal history of other (healed) physical injury and trauma: Secondary | ICD-10-CM

## 2015-01-06 DIAGNOSIS — S060XAA Concussion with loss of consciousness status unknown, initial encounter: Secondary | ICD-10-CM | POA: Diagnosis present

## 2015-01-06 DIAGNOSIS — R58 Hemorrhage, not elsewhere classified: Secondary | ICD-10-CM

## 2015-01-06 DIAGNOSIS — S32810S Multiple fractures of pelvis with stable disruption of pelvic ring, sequela: Secondary | ICD-10-CM | POA: Diagnosis not present

## 2015-01-06 DIAGNOSIS — S270XXA Traumatic pneumothorax, initial encounter: Secondary | ICD-10-CM | POA: Diagnosis present

## 2015-01-06 DIAGNOSIS — S2241XA Multiple fractures of ribs, right side, initial encounter for closed fracture: Secondary | ICD-10-CM | POA: Diagnosis present

## 2015-01-06 DIAGNOSIS — Z418 Encounter for other procedures for purposes other than remedying health state: Secondary | ICD-10-CM

## 2015-01-06 DIAGNOSIS — S32810A Multiple fractures of pelvis with stable disruption of pelvic ring, initial encounter for closed fracture: Secondary | ICD-10-CM

## 2015-01-06 HISTORY — DX: Unspecified fracture of shaft of right tibia, initial encounter for open fracture type I or II: S82.201B

## 2015-01-06 HISTORY — DX: Displaced subtrochanteric fracture of left femur, initial encounter for closed fracture: S72.22XA

## 2015-01-06 HISTORY — DX: Unspecified fracture of shaft of left tibia, initial encounter for open fracture type IIIA, IIIB, or IIIC: S82.202C

## 2015-01-06 HISTORY — PX: FEMUR IM NAIL: SHX1597

## 2015-01-06 LAB — POCT I-STAT 3, ART BLOOD GAS (G3+)
Acid-base deficit: 10 mmol/L — ABNORMAL HIGH (ref 0.0–2.0)
Bicarbonate: 16.8 mEq/L — ABNORMAL LOW (ref 20.0–24.0)
O2 Saturation: 91 %
Patient temperature: 97.9
TCO2: 18 mmol/L (ref 0–100)
pCO2 arterial: 37.7 mmHg (ref 35.0–45.0)
pH, Arterial: 7.256 — ABNORMAL LOW (ref 7.350–7.450)
pO2, Arterial: 67 mmHg — ABNORMAL LOW (ref 80.0–100.0)

## 2015-01-06 LAB — POCT I-STAT 7, (LYTES, BLD GAS, ICA,H+H)
Acid-base deficit: 10 mmol/L — ABNORMAL HIGH (ref 0.0–2.0)
Acid-base deficit: 9 mmol/L — ABNORMAL HIGH (ref 0.0–2.0)
Acid-base deficit: 10 mmol/L — ABNORMAL HIGH (ref 0.0–2.0)
Acid-base deficit: 8 mmol/L — ABNORMAL HIGH (ref 0.0–2.0)
Bicarbonate: 18.5 mEq/L — ABNORMAL LOW (ref 20.0–24.0)
Bicarbonate: 18.7 mEq/L — ABNORMAL LOW (ref 20.0–24.0)
Bicarbonate: 18.1 mEq/L — ABNORMAL LOW (ref 20.0–24.0)
Bicarbonate: 18 mEq/L — ABNORMAL LOW (ref 20.0–24.0)
Calcium, Ion: 1.12 mmol/L (ref 1.12–1.23)
Calcium, Ion: 1.04 mmol/L — ABNORMAL LOW (ref 1.12–1.23)
Calcium, Ion: 1.05 mmol/L — ABNORMAL LOW (ref 1.12–1.23)
Calcium, Ion: 1.21 mmol/L (ref 1.12–1.23)
HCT: 20 % — ABNORMAL LOW (ref 39.0–52.0)
HCT: 22 % — ABNORMAL LOW (ref 39.0–52.0)
HCT: 27 % — ABNORMAL LOW (ref 39.0–52.0)
HCT: 25 % — ABNORMAL LOW (ref 39.0–52.0)
Hemoglobin: 6.8 g/dL — CL (ref 13.0–17.0)
Hemoglobin: 7.5 g/dL — ABNORMAL LOW (ref 13.0–17.0)
Hemoglobin: 9.2 g/dL — ABNORMAL LOW (ref 13.0–17.0)
Hemoglobin: 8.5 g/dL — ABNORMAL LOW (ref 13.0–17.0)
O2 Saturation: 100 %
O2 Saturation: 100 %
O2 Saturation: 100 %
O2 Saturation: 100 %
Patient temperature: 35.8
Patient temperature: 35.4
Patient temperature: 35.3
Patient temperature: 35.5
Potassium: 3.8 mmol/L (ref 3.5–5.1)
Potassium: 4.4 mmol/L (ref 3.5–5.1)
Potassium: 5.3 mmol/L — ABNORMAL HIGH (ref 3.5–5.1)
Potassium: 4.5 mmol/L (ref 3.5–5.1)
Sodium: 142 mmol/L (ref 135–145)
Sodium: 142 mmol/L (ref 135–145)
Sodium: 140 mmol/L (ref 135–145)
Sodium: 142 mmol/L (ref 135–145)
TCO2: 20 mmol/L (ref 0–100)
TCO2: 20 mmol/L (ref 0–100)
TCO2: 20 mmol/L (ref 0–100)
TCO2: 19 mmol/L (ref 0–100)
pCO2 arterial: 52.1 mmHg — ABNORMAL HIGH (ref 35.0–45.0)
pCO2 arterial: 47.9 mmHg — ABNORMAL HIGH (ref 35.0–45.0)
pCO2 arterial: 46.7 mmHg — ABNORMAL HIGH (ref 35.0–45.0)
pCO2 arterial: 36.9 mmHg (ref 35.0–45.0)
pH, Arterial: 7.15 — CL (ref 7.350–7.450)
pH, Arterial: 7.192 — CL (ref 7.350–7.450)
pH, Arterial: 7.188 — CL (ref 7.350–7.450)
pH, Arterial: 7.287 — ABNORMAL LOW (ref 7.350–7.450)
pO2, Arterial: 326 mmHg — ABNORMAL HIGH (ref 80.0–100.0)
pO2, Arterial: 346 mmHg — ABNORMAL HIGH (ref 80.0–100.0)
pO2, Arterial: 318 mmHg — ABNORMAL HIGH (ref 80.0–100.0)
pO2, Arterial: 299 mmHg — ABNORMAL HIGH (ref 80.0–100.0)

## 2015-01-06 LAB — ABO/RH: ABO/RH(D): A POS

## 2015-01-06 LAB — PREPARE RBC (CROSSMATCH)

## 2015-01-06 LAB — POCT I-STAT 4, (NA,K, GLUC, HGB,HCT)
Glucose, Bld: 130 mg/dL — ABNORMAL HIGH (ref 65–99)
HCT: 26 % — ABNORMAL LOW (ref 39.0–52.0)
Hemoglobin: 8.8 g/dL — ABNORMAL LOW (ref 13.0–17.0)
Potassium: 4.4 mmol/L (ref 3.5–5.1)
Sodium: 143 mmol/L (ref 135–145)

## 2015-01-06 LAB — CBC
HCT: 37.9 % — ABNORMAL LOW (ref 39.0–52.0)
HCT: 27 % — ABNORMAL LOW (ref 39.0–52.0)
HCT: 29.6 % — ABNORMAL LOW (ref 39.0–52.0)
HCT: 28.2 % — ABNORMAL LOW (ref 39.0–52.0)
Hemoglobin: 13.3 g/dL (ref 13.0–17.0)
Hemoglobin: 9.4 g/dL — ABNORMAL LOW (ref 13.0–17.0)
Hemoglobin: 10.6 g/dL — ABNORMAL LOW (ref 13.0–17.0)
Hemoglobin: 10.1 g/dL — ABNORMAL LOW (ref 13.0–17.0)
MCH: 31.1 pg (ref 26.0–34.0)
MCH: 30.9 pg (ref 26.0–34.0)
MCH: 30.7 pg (ref 26.0–34.0)
MCH: 30.3 pg (ref 26.0–34.0)
MCHC: 35.1 g/dL (ref 30.0–36.0)
MCHC: 34.8 g/dL (ref 30.0–36.0)
MCHC: 35.8 g/dL (ref 30.0–36.0)
MCHC: 35.8 g/dL (ref 30.0–36.0)
MCV: 88.8 fL (ref 78.0–100.0)
MCV: 88.8 fL (ref 78.0–100.0)
MCV: 85.8 fL (ref 78.0–100.0)
MCV: 84.7 fL (ref 78.0–100.0)
Platelets: 224 10*3/uL (ref 150–400)
Platelets: 87 10*3/uL — ABNORMAL LOW (ref 150–400)
Platelets: 112 10*3/uL — ABNORMAL LOW (ref 150–400)
Platelets: 105 10*3/uL — ABNORMAL LOW (ref 150–400)
RBC: 4.27 MIL/uL (ref 4.22–5.81)
RBC: 3.04 MIL/uL — ABNORMAL LOW (ref 4.22–5.81)
RBC: 3.45 MIL/uL — ABNORMAL LOW (ref 4.22–5.81)
RBC: 3.33 MIL/uL — ABNORMAL LOW (ref 4.22–5.81)
RDW: 13.2 % (ref 11.5–15.5)
RDW: 13.1 % (ref 11.5–15.5)
RDW: 13.1 % (ref 11.5–15.5)
RDW: 13.4 % (ref 11.5–15.5)
WBC: 15.8 10*3/uL — ABNORMAL HIGH (ref 4.0–10.5)
WBC: 15.4 10*3/uL — ABNORMAL HIGH (ref 4.0–10.5)
WBC: 8 10*3/uL (ref 4.0–10.5)
WBC: 9 10*3/uL (ref 4.0–10.5)

## 2015-01-06 LAB — COMPREHENSIVE METABOLIC PANEL
ALT: 326 U/L — ABNORMAL HIGH (ref 17–63)
AST: 389 U/L — ABNORMAL HIGH (ref 15–41)
Albumin: 3.4 g/dL — ABNORMAL LOW (ref 3.5–5.0)
Alkaline Phosphatase: 62 U/L (ref 38–126)
Anion gap: 9 (ref 5–15)
BUN: 14 mg/dL (ref 6–20)
CO2: 22 mmol/L (ref 22–32)
Calcium: 8.4 mg/dL — ABNORMAL LOW (ref 8.9–10.3)
Chloride: 108 mmol/L (ref 101–111)
Creatinine, Ser: 1.44 mg/dL — ABNORMAL HIGH (ref 0.61–1.24)
GFR calc Af Amer: >60 mL/min (ref >60)
GFR calc non Af Amer: >60 mL/min (ref >60)
Glucose, Bld: 233 mg/dL — ABNORMAL HIGH (ref 65–99)
Potassium: 2.9 mmol/L — ABNORMAL LOW (ref 3.5–5.1)
Sodium: 139 mmol/L (ref 135–145)
Total Bilirubin: 0.7 mg/dL (ref 0.3–1.2)
Total Protein: 5.8 g/dL — ABNORMAL LOW (ref 6.5–8.1)

## 2015-01-06 LAB — BASIC METABOLIC PANEL
Anion gap: 5 (ref 5–15)
BUN: 12 mg/dL (ref 6–20)
CO2: 19 mmol/L — ABNORMAL LOW (ref 22–32)
Calcium: 7.5 mg/dL — ABNORMAL LOW (ref 8.9–10.3)
Chloride: 116 mmol/L — ABNORMAL HIGH (ref 101–111)
Creatinine, Ser: 0.95 mg/dL (ref 0.61–1.24)
GFR calc Af Amer: >60 mL/min (ref >60)
GFR calc non Af Amer: >60 mL/min (ref >60)
Glucose, Bld: 125 mg/dL — ABNORMAL HIGH (ref 65–99)
Potassium: 4.3 mmol/L (ref 3.5–5.1)
Sodium: 140 mmol/L (ref 135–145)

## 2015-01-06 LAB — I-STAT CHEM 8, ED
BUN: 20 mg/dL (ref 6–20)
Calcium, Ion: 1.15 mmol/L (ref 1.12–1.23)
Chloride: 106 mmol/L (ref 101–111)
Creatinine, Ser: 1.3 mg/dL — ABNORMAL HIGH (ref 0.61–1.24)
Glucose, Bld: 225 mg/dL — ABNORMAL HIGH (ref 65–99)
HCT: 39 % (ref 39.0–52.0)
Hemoglobin: 13.3 g/dL (ref 13.0–17.0)
Potassium: 3 mmol/L — ABNORMAL LOW (ref 3.5–5.1)
Sodium: 143 mmol/L (ref 135–145)
TCO2: 20 mmol/L (ref 0–100)

## 2015-01-06 LAB — ETHANOL: Alcohol, Ethyl (B): <5 mg/dL (ref <5)

## 2015-01-06 LAB — APTT: aPTT: 38 seconds — ABNORMAL HIGH (ref 24–37)

## 2015-01-06 LAB — PROTIME-INR
INR: 1.4 (ref 0.00–1.49)
INR: 2.05 — ABNORMAL HIGH (ref 0.00–1.49)
Prothrombin Time: 17.2 seconds — ABNORMAL HIGH (ref 11.6–15.2)
Prothrombin Time: 22.9 seconds — ABNORMAL HIGH (ref 11.6–15.2)

## 2015-01-06 LAB — TRIGLYCERIDES: Triglycerides: 71 mg/dL (ref <150)

## 2015-01-06 LAB — LACTIC ACID, PLASMA: Lactic Acid, Venous: 3.4 mmol/L (ref 0.5–2.0)

## 2015-01-06 LAB — CDS SEROLOGY

## 2015-01-06 SURGERY — INSERTION, INTRAMEDULLARY ROD, FEMUR
Anesthesia: General | Laterality: Bilateral

## 2015-01-06 MED ORDER — MIDAZOLAM HCL 2 MG/2ML IJ SOLN
INTRAMUSCULAR | Status: AC
  Filled 2015-01-06: qty 4

## 2015-01-06 MED ORDER — EPHEDRINE SULFATE 50 MG/ML IJ SOLN
INTRAMUSCULAR | Status: AC
  Filled 2015-01-06: qty 1

## 2015-01-06 MED ORDER — LIDOCAINE HCL (CARDIAC) 20 MG/ML IV SOLN
INTRAVENOUS | Status: AC
  Filled 2015-01-06: qty 10

## 2015-01-06 MED ORDER — ANTISEPTIC ORAL RINSE SOLUTION (CORINZ): 7.0000 mL | Freq: Four times a day (QID) | OROMUCOSAL | Status: DC

## 2015-01-06 MED ORDER — ONDANSETRON HCL 4 MG/2ML IJ SOLN
INTRAMUSCULAR | Status: AC
  Filled 2015-01-06: qty 2

## 2015-01-06 MED ORDER — CHLORHEXIDINE GLUCONATE 0.12% ORAL RINSE (MEDLINE KIT)
15.0000 mL | Freq: Two times a day (BID) | OROMUCOSAL | Status: DC
  Administered 2015-01-06: 15 mL via OROMUCOSAL

## 2015-01-06 MED ORDER — TETANUS-DIPHTH-ACELL PERTUSSIS 5-2.5-18.5 LF-MCG/0.5 IM SUSP
0.5000 mL | Freq: Once | INTRAMUSCULAR | Status: AC
  Administered 2015-01-06: 0.5 mL via INTRAMUSCULAR
  Filled 2015-01-06: qty 0.5

## 2015-01-06 MED ORDER — CEFAZOLIN SODIUM-DEXTROSE 2-3 GM-% IV SOLR
2.0000 g | Freq: Once | INTRAVENOUS | Status: AC
  Administered 2015-01-06: 2 g via INTRAVENOUS
  Filled 2015-01-06: qty 50

## 2015-01-06 MED ORDER — MORPHINE SULFATE (PF) 2 MG/ML IV SOLN
INTRAVENOUS | Status: AC
  Filled 2015-01-06: qty 2

## 2015-01-06 MED ORDER — PANTOPRAZOLE SODIUM 40 MG IV SOLR: 40.0000 mg | Freq: Every day | INTRAVENOUS | Status: DC

## 2015-01-06 MED ORDER — FENTANYL CITRATE (PF) 100 MCG/2ML IJ SOLN
INTRAMUSCULAR | Status: DC | PRN
  Administered 2015-01-06: 150 ug via INTRAVENOUS

## 2015-01-06 MED ORDER — GENTAMICIN SULFATE 40 MG/ML IJ SOLN: 300.0000 mg | Freq: Once | INTRAVENOUS | Status: DC

## 2015-01-06 MED ORDER — PROPOFOL 10 MG/ML IV BOLUS
INTRAVENOUS | Status: DC | PRN
Start: 2015-01-06 — End: 2015-01-06
  Administered 2015-01-06: 150 mg via INTRAVENOUS

## 2015-01-06 MED ORDER — ALBUMIN HUMAN 5 % IV SOLN
INTRAVENOUS | Status: DC | PRN
  Administered 2015-01-06: 15:00:00 via INTRAVENOUS

## 2015-01-06 MED ORDER — CEFAZOLIN SODIUM-DEXTROSE 2-3 GM-% IV SOLR
2.0000 g | Freq: Three times a day (TID) | INTRAVENOUS | Status: DC
  Administered 2015-01-06 – 2015-01-11 (×13): 2 g via INTRAVENOUS
  Filled 2015-01-06 (×15): qty 50

## 2015-01-06 MED ORDER — PHENYLEPHRINE HCL 10 MG/ML IJ SOLN
10.0000 mg | INTRAVENOUS | Status: DC | PRN
  Administered 2015-01-06: 50 ug/min via INTRAVENOUS
  Administered 2015-01-06: 13:00:00 via INTRAVENOUS

## 2015-01-06 MED ORDER — SODIUM CHLORIDE 0.9 % IR SOLN
Status: DC | PRN
  Administered 2015-01-06: 12000 mL

## 2015-01-06 MED ORDER — ONDANSETRON HCL 4 MG/2ML IJ SOLN
4.0000 mg | Freq: Four times a day (QID) | INTRAMUSCULAR | Status: DC | PRN
  Administered 2015-01-19: 4 mg via INTRAVENOUS
  Filled 2015-01-06: qty 2

## 2015-01-06 MED ORDER — SUCCINYLCHOLINE CHLORIDE 20 MG/ML IJ SOLN
INTRAMUSCULAR | Status: DC | PRN
  Administered 2015-01-06: 180 mg via INTRAVENOUS

## 2015-01-06 MED ORDER — POLYETHYLENE GLYCOL 3350 17 G PO PACK
17.0000 g | PACK | Freq: Every day | ORAL | Status: DC
  Filled 2015-01-06 (×2): qty 1

## 2015-01-06 MED ORDER — DEXMEDETOMIDINE HCL IN NACL 200 MCG/50ML IV SOLN
INTRAVENOUS | Status: DC | PRN
  Administered 2015-01-06: 0.7 ug/kg/h via INTRAVENOUS

## 2015-01-06 MED ORDER — SODIUM CHLORIDE 0.9 % IV SOLN
INTRAVENOUS | Status: DC | PRN
Start: 2015-01-06 — End: 2015-01-06
  Administered 2015-01-06 (×4): via INTRAVENOUS

## 2015-01-06 MED ORDER — LACTATED RINGERS IV SOLN
INTRAVENOUS | Status: DC | PRN
  Administered 2015-01-06 (×2): via INTRAVENOUS

## 2015-01-06 MED ORDER — SODIUM CHLORIDE 0.9 % IV SOLN
INTRAVENOUS | Status: AC | PRN
  Administered 2015-01-06: 1000 mL via INTRAVENOUS

## 2015-01-06 MED ORDER — LIDOCAINE-EPINEPHRINE (PF) 2 %-1:200000 IJ SOLN
INTRAMUSCULAR | Status: AC
  Filled 2015-01-06: qty 20

## 2015-01-06 MED ORDER — CHLORHEXIDINE GLUCONATE 0.12% ORAL RINSE (MEDLINE KIT)
15.0000 mL | Freq: Two times a day (BID) | OROMUCOSAL | Status: DC
  Administered 2015-01-06 – 2015-02-13 (×72): 15 mL via OROMUCOSAL
  Filled 2015-01-06 (×3): qty 15

## 2015-01-06 MED ORDER — ROCURONIUM BROMIDE 50 MG/5ML IV SOLN
INTRAVENOUS | Status: AC
  Filled 2015-01-06: qty 1

## 2015-01-06 MED ORDER — ENOXAPARIN SODIUM 30 MG/0.3ML ~~LOC~~ SOLN
30.0000 mg | Freq: Two times a day (BID) | SUBCUTANEOUS | Status: DC
  Administered 2015-01-06 – 2015-01-07 (×3): 30 mg via SUBCUTANEOUS
  Filled 2015-01-06 (×4): qty 0.3

## 2015-01-06 MED ORDER — MORPHINE SULFATE 2 MG/ML IJ SOLN
INTRAMUSCULAR | Status: AC | PRN
  Administered 2015-01-06: 4 mg via INTRAVENOUS

## 2015-01-06 MED ORDER — PROPOFOL 1000 MG/100ML IV EMUL
5.0000 ug/kg/min | INTRAVENOUS | Status: DC
  Administered 2015-01-06: 20 ug/kg/min via INTRAVENOUS
  Filled 2015-01-06: qty 100

## 2015-01-06 MED ORDER — PANTOPRAZOLE SODIUM 40 MG IV SOLR
40.0000 mg | Freq: Every day | INTRAVENOUS | Status: DC
  Administered 2015-01-07 – 2015-01-26 (×20): 40 mg via INTRAVENOUS
  Filled 2015-01-06 (×25): qty 40

## 2015-01-06 MED ORDER — SODIUM CHLORIDE 0.9 % IV SOLN: 10.0000 mL/h | Freq: Once | INTRAVENOUS | Status: AC

## 2015-01-06 MED ORDER — SUCCINYLCHOLINE CHLORIDE 20 MG/ML IJ SOLN
INTRAMUSCULAR | Status: AC
  Filled 2015-01-06: qty 1

## 2015-01-06 MED ORDER — ARTIFICIAL TEARS OP OINT
TOPICAL_OINTMENT | OPHTHALMIC | Status: AC
  Filled 2015-01-06: qty 3.5

## 2015-01-06 MED ORDER — POTASSIUM CHLORIDE IN NACL 20-0.45 MEQ/L-% IV SOLN
INTRAVENOUS | Status: AC
  Administered 2015-01-08 (×2): via INTRAVENOUS
  Administered 2015-01-09: 100 mL via INTRAVENOUS
  Administered 2015-01-09: 04:00:00 via INTRAVENOUS
  Administered 2015-01-10: 100 mL/h via INTRAVENOUS
  Administered 2015-01-10: 50 mL/h via INTRAVENOUS
  Administered 2015-01-11 – 2015-01-15 (×5): via INTRAVENOUS
  Administered 2015-01-15: 1000 mL via INTRAVENOUS
  Administered 2015-01-16 – 2015-01-17 (×2): via INTRAVENOUS
  Filled 2015-01-06 (×23): qty 1000

## 2015-01-06 MED ORDER — PROPOFOL 10 MG/ML IV BOLUS
INTRAVENOUS | Status: AC
  Filled 2015-01-06: qty 20

## 2015-01-06 MED ORDER — SODIUM BICARBONATE 8.4 % IV SOLN
INTRAVENOUS | Status: DC | PRN
  Administered 2015-01-06: 50 meq via INTRAVENOUS

## 2015-01-06 MED ORDER — ONDANSETRON HCL 4 MG PO TABS: 4.0000 mg | ORAL_TABLET | Freq: Four times a day (QID) | ORAL | Status: DC | PRN

## 2015-01-06 MED ORDER — GENTAMICIN SULFATE 40 MG/ML IJ SOLN
500.0000 mg | INTRAMUSCULAR | Status: DC
  Filled 2015-01-06 (×2): qty 12.5

## 2015-01-06 MED ORDER — FENTANYL CITRATE (PF) 250 MCG/5ML IJ SOLN
INTRAMUSCULAR | Status: AC
  Filled 2015-01-06: qty 5

## 2015-01-06 MED ORDER — MIDAZOLAM HCL 5 MG/5ML IJ SOLN
INTRAMUSCULAR | Status: DC | PRN
  Administered 2015-01-06: 2 mg via INTRAVENOUS
  Administered 2015-01-06: 4 mg via INTRAVENOUS

## 2015-01-06 MED ORDER — LACTATED RINGERS IV SOLN
INTRAVENOUS | Status: DC | PRN
  Administered 2015-01-06: 11:00:00 via INTRAVENOUS

## 2015-01-06 MED ORDER — ONDANSETRON HCL 4 MG/2ML IJ SOLN
4.0000 mg | Freq: Four times a day (QID) | INTRAMUSCULAR | Status: DC | PRN
  Administered 2015-02-04: 4 mg via INTRAVENOUS
  Filled 2015-01-06 (×3): qty 2

## 2015-01-06 MED ORDER — DOCUSATE SODIUM 100 MG PO CAPS: 100.0000 mg | ORAL_CAPSULE | Freq: Two times a day (BID) | ORAL | Status: DC

## 2015-01-06 MED ORDER — ROCURONIUM BROMIDE 100 MG/10ML IV SOLN
INTRAVENOUS | Status: DC | PRN
  Administered 2015-01-06 (×3): 50 mg via INTRAVENOUS

## 2015-01-06 MED ORDER — DEXTROSE 5 % IV SOLN
5.0000 mg/kg | INTRAVENOUS | Status: AC
  Administered 2015-01-06: 360 mg via INTRAVENOUS
  Filled 2015-01-06: qty 9

## 2015-01-06 MED ORDER — POTASSIUM CHLORIDE IN NACL 20-0.9 MEQ/L-% IV SOLN
INTRAVENOUS | Status: DC
  Filled 2015-01-06 (×3): qty 1000

## 2015-01-06 MED ORDER — DEXMEDETOMIDINE HCL IN NACL 200 MCG/50ML IV SOLN
0.4000 ug/kg/h | INTRAVENOUS | Status: DC
  Administered 2015-01-06: 0.5 ug/kg/h via INTRAVENOUS
  Administered 2015-01-06: 0.7 ug/kg/h via INTRAVENOUS
  Administered 2015-01-07: 0.5 ug/kg/h via INTRAVENOUS
  Administered 2015-01-07: 0.7 ug/kg/h via INTRAVENOUS
  Administered 2015-01-07: 0.5 ug/kg/h via INTRAVENOUS
  Filled 2015-01-06 (×5): qty 50

## 2015-01-06 MED ORDER — IOHEXOL 300 MG/ML  SOLN
100.0000 mL | Freq: Once | INTRAMUSCULAR | Status: AC | PRN
  Administered 2015-01-06: 100 mL via INTRAVENOUS

## 2015-01-06 MED ORDER — CALCIUM CHLORIDE 10 % IV SOLN
INTRAVENOUS | Status: DC | PRN
  Administered 2015-01-06: 1 g via INTRAVENOUS

## 2015-01-06 MED ORDER — SODIUM CHLORIDE 0.9 % IV BOLUS (SEPSIS): 1000.0000 mL | Freq: Once | INTRAVENOUS | Status: AC

## 2015-01-06 MED ORDER — STERILE WATER FOR INJECTION IJ SOLN
INTRAMUSCULAR | Status: AC
  Filled 2015-01-06: qty 10

## 2015-01-06 MED ORDER — MORPHINE SULFATE (PF) 4 MG/ML IV SOLN: 4.0000 mg | Freq: Once | INTRAVENOUS | Status: AC

## 2015-01-06 MED ORDER — SODIUM CHLORIDE 0.9 % IV SOLN
25.0000 ug/h | INTRAVENOUS | Status: DC
  Administered 2015-01-06 (×2): 50 ug/h via INTRAVENOUS
  Administered 2015-01-07: 150 ug/h via INTRAVENOUS
  Administered 2015-01-08 (×2): 200 ug/h via INTRAVENOUS
  Administered 2015-01-09: 100 ug/h via INTRAVENOUS
  Administered 2015-01-10: 200 ug/h via INTRAVENOUS
  Administered 2015-01-10 – 2015-01-11 (×2): 100 ug/h via INTRAVENOUS
  Administered 2015-01-12 (×2): 200 ug/h via INTRAVENOUS
  Administered 2015-01-12 – 2015-01-15 (×9): 300 ug/h via INTRAVENOUS
  Administered 2015-01-15: 350 ug/h via INTRAVENOUS
  Administered 2015-01-15 – 2015-01-16 (×2): 400 ug/h via INTRAVENOUS
  Administered 2015-01-16 (×2): 350 ug/h via INTRAVENOUS
  Administered 2015-01-17 (×3): 400 ug/h via INTRAVENOUS
  Administered 2015-01-17 – 2015-01-18 (×2): 200 ug/h via INTRAVENOUS
  Administered 2015-01-18 (×2): 400 ug/h via INTRAVENOUS
  Administered 2015-01-19: 300 ug/h via INTRAVENOUS
  Administered 2015-01-20: 200 ug/h via INTRAVENOUS
  Administered 2015-01-20 – 2015-01-21 (×3): 250 ug/h via INTRAVENOUS
  Administered 2015-01-21 – 2015-01-22 (×3): 275 ug/h via INTRAVENOUS
  Administered 2015-01-23: 300 ug/h via INTRAVENOUS
  Administered 2015-01-23: 200 ug/h via INTRAVENOUS
  Administered 2015-01-23: 275 ug/h via INTRAVENOUS
  Administered 2015-01-24: 250 ug/h via INTRAVENOUS
  Administered 2015-01-24: 200 ug/h via INTRAVENOUS
  Administered 2015-01-25: 375 ug/h via INTRAVENOUS
  Administered 2015-01-25: 250 ug/h via INTRAVENOUS
  Administered 2015-01-25: 300 ug/h via INTRAVENOUS
  Administered 2015-01-26: 175 ug/h via INTRAVENOUS
  Administered 2015-01-26: 225 ug/h via INTRAVENOUS
  Administered 2015-01-27 – 2015-01-28 (×3): 200 ug/h via INTRAVENOUS
  Filled 2015-01-06 (×55): qty 50

## 2015-01-06 MED ORDER — CHLORHEXIDINE GLUCONATE 0.12 % MT SOLN
OROMUCOSAL | Status: AC
  Administered 2015-01-06: 15 mL
  Filled 2015-01-06: qty 15

## 2015-01-06 MED ORDER — PANTOPRAZOLE SODIUM 40 MG PO TBEC: 40.0000 mg | DELAYED_RELEASE_TABLET | Freq: Every day | ORAL | Status: DC

## 2015-01-06 MED ORDER — ANTISEPTIC ORAL RINSE SOLUTION (CORINZ)
7.0000 mL | Freq: Four times a day (QID) | OROMUCOSAL | Status: DC
  Administered 2015-01-07 – 2015-02-12 (×139): 7 mL via OROMUCOSAL

## 2015-01-06 MED ORDER — FENTANYL CITRATE (PF) 100 MCG/2ML IJ SOLN: 50.0000 ug | Freq: Once | INTRAMUSCULAR | Status: AC

## 2015-01-06 MED ORDER — FENTANYL BOLUS VIA INFUSION
25.0000 ug | INTRAVENOUS | Status: DC | PRN
  Administered 2015-01-16 – 2015-01-18 (×3): 25 ug via INTRAVENOUS
  Filled 2015-01-06: qty 25

## 2015-01-06 SURGICAL SUPPLY — 69 items
APL SKNCLS STERI-STRIP NONHPOA (GAUZE/BANDAGES/DRESSINGS)
BANDAGE ELASTIC 4 VELCRO ST LF (GAUZE/BANDAGES/DRESSINGS) ×1 IMPLANT
BANDAGE ELASTIC 6 VELCRO ST LF (GAUZE/BANDAGES/DRESSINGS) ×1 IMPLANT
BAR EXFX 300X11 NS LF (MISCELLANEOUS) ×2
BAR EXFX 350X11 NS LF (EXFIX) ×2
BAR EXFX 400X11 NS LF (EXFIX) ×4
BAR GLASS FIBER EXFX 11X300 (MISCELLANEOUS) ×2 IMPLANT
BAR GLASS FIBER EXFX 11X350 (EXFIX) ×2 IMPLANT
BAR GLASS FIBER EXFX 11X400 (EXFIX) ×4 IMPLANT
BENZOIN TINCTURE PRP APPL 2/3 (GAUZE/BANDAGES/DRESSINGS) ×1 IMPLANT
BNDG COHESIVE 4X5 TAN STRL (GAUZE/BANDAGES/DRESSINGS) ×2 IMPLANT
BNDG GAUZE ELAST 4 BULKY (GAUZE/BANDAGES/DRESSINGS) ×2 IMPLANT
BOOTCOVER CLEANROOM LRG (PROTECTIVE WEAR) ×4 IMPLANT
CLAMP BLUE BAR TO BAR (MISCELLANEOUS) ×3 IMPLANT
CLAMP BLUE BAR TO PIN (MISCELLANEOUS) ×6 IMPLANT
CLSR STERI-STRIP ANTIMIC 1/2X4 (GAUZE/BANDAGES/DRESSINGS) ×1 IMPLANT
COVER PERINEAL POST (MISCELLANEOUS) ×1 IMPLANT
COVER SURGICAL LIGHT HANDLE (MISCELLANEOUS) ×2 IMPLANT
DRAPE C-ARM 42X72 X-RAY (DRAPES) ×1 IMPLANT
DRAPE C-ARMOR (DRAPES) ×1 IMPLANT
DRAPE EXTREMITY BILATERAL (DRAPE) ×1 IMPLANT
DRAPE IMP U-DRAPE 54X76 (DRAPES) ×2 IMPLANT
DRAPE ORTHO SPLIT 77X108 STRL (DRAPES) ×8
DRAPE PROXIMA HALF (DRAPES) ×1 IMPLANT
DRAPE STERI IOBAN 125X83 (DRAPES) ×1 IMPLANT
DRAPE SURG ORHT 6 SPLT 77X108 (DRAPES) IMPLANT
DRAPE U-SHAPE 47X51 STRL (DRAPES) ×2 IMPLANT
DRSG MEPILEX BORDER 4X4 (GAUZE/BANDAGES/DRESSINGS) ×2 IMPLANT
DRSG MEPILEX BORDER 4X8 (GAUZE/BANDAGES/DRESSINGS) ×4 IMPLANT
DURAPREP 26ML APPLICATOR (WOUND CARE) ×1 IMPLANT
ELECT CAUTERY BLADE 6.4 (BLADE) ×1 IMPLANT
ELECT REM PT RETURN 9FT ADLT (ELECTROSURGICAL) ×2
ELECTRODE REM PT RTRN 9FT ADLT (ELECTROSURGICAL) ×1 IMPLANT
EVACUATOR 1/8 PVC DRAIN (DRAIN) IMPLANT
FACESHIELD WRAPAROUND (MASK) ×4 IMPLANT
FACESHIELD WRAPAROUND OR TEAM (MASK) ×2 IMPLANT
GAUZE XEROFORM 1X8 LF (GAUZE/BANDAGES/DRESSINGS) ×1 IMPLANT
GAUZE XEROFORM 5X9 LF (GAUZE/BANDAGES/DRESSINGS) ×2 IMPLANT
GLOVE BIOGEL PI ORTHO PRO SZ8 (GLOVE) ×2
GLOVE ORTHO TXT STRL SZ7.5 (GLOVE) ×4 IMPLANT
GLOVE PI ORTHO PRO STRL SZ8 (GLOVE) ×2 IMPLANT
GLOVE SURG ORTHO 8.0 STRL STRW (GLOVE) ×4 IMPLANT
GOWN STRL REUS W/ TWL XL LVL3 (GOWN DISPOSABLE) ×1 IMPLANT
GOWN STRL REUS W/TWL 2XL LVL3 (GOWN DISPOSABLE) IMPLANT
GOWN STRL REUS W/TWL XL LVL3 (GOWN DISPOSABLE) ×4
HALF PIN 5.0X160 (PIN) ×2 IMPLANT
KIT ROOM TURNOVER OR (KITS) ×2 IMPLANT
LINER BOOT UNIVERSAL DISP (MISCELLANEOUS) ×1 IMPLANT
MANIFOLD NEPTUNE II (INSTRUMENTS) ×2 IMPLANT
NS IRRIG 1000ML POUR BTL (IV SOLUTION) ×2 IMPLANT
PACK GENERAL/GYN (CUSTOM PROCEDURE TRAY) ×2 IMPLANT
PAD ARMBOARD 7.5X6 YLW CONV (MISCELLANEOUS) ×3 IMPLANT
PIN CLAMP 2BAR 75MM BLUE (PIN) ×3 IMPLANT
PIN HALF YELLOW 5X160X35 (PIN) ×4 IMPLANT
PIN TRANSFIXING 5.0 (PIN) ×3 IMPLANT
SET MONITOR QUICK PRESSURE (MISCELLANEOUS) ×1 IMPLANT
SPONGE GAUZE 4X4 12PLY STER LF (GAUZE/BANDAGES/DRESSINGS) ×2 IMPLANT
SPONGE LAP 18X18 X RAY DECT (DISPOSABLE) ×3 IMPLANT
STAPLER VISISTAT 35W (STAPLE) ×1 IMPLANT
STOCKINETTE IMPERVIOUS LG (DRAPES) ×2 IMPLANT
SUT ETHILON 3 0 PS 1 (SUTURE) ×4 IMPLANT
SUT VIC AB 0 CTB1 27 (SUTURE) ×1 IMPLANT
SUT VIC AB 2-0 FS1 27 (SUTURE) ×1 IMPLANT
SUT VIC AB 2-0 SH 27 (SUTURE)
SUT VIC AB 2-0 SH 27XBRD (SUTURE) IMPLANT
SUT VIC AB 3-0 SH 8-18 (SUTURE) ×2 IMPLANT
TOWEL OR 17X24 6PK STRL BLUE (TOWEL DISPOSABLE) ×2 IMPLANT
TOWEL OR 17X26 10 PK STRL BLUE (TOWEL DISPOSABLE) ×2 IMPLANT
WATER STERILE IRR 1000ML POUR (IV SOLUTION) ×1 IMPLANT
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
## 2015-01-06 NOTE — ED Notes (Addendum)
Pt returned from CT °

## 2015-01-06 NOTE — ED Notes (Addendum)
   1st unit of FFP started.  Blood bank called to have more sent down.  Secretary to run to blood bank.

## 2015-01-06 NOTE — ED Notes (Signed)
Patient transported to OR by this nurse and Dr. Magnus Ivan.  Patient on portable BP, pulse, and pulse oximetry monitoring.
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
## 2015-01-06 NOTE — ED Notes (Addendum)
  Xray finished with patient. Family brought to bedside

## 2015-01-06 NOTE — Transfer of Care (Signed)
Immediate Anesthesia Transfer of Care Note  Patient: Juan [name]  Procedure(s) Performed: Procedure(s): IRRIGATION AND DEBRIDEMENT BILATERAL LEGS WITH APPLICATION EXTERNAL FIXATOR RIGHT  TIBIA AND APPLICATION EXTERNAL FIXATORS TO LEFT FEMUR  AND LEFT TIBIA  (Bilateral)  Patient Location: ICU  Anesthesia Type:General  Level of Consciousness: sedated, unresponsive and Patient remains intubated per anesthesia plan  Airway & Oxygen Therapy: Patient remains intubated per anesthesia plan and Patient placed on Ventilator (see vital sign flow sheet for setting)  Post-op Assessment: Report given to RN and Post -op Vital signs reviewed and stable  Post vital signs: Reviewed and stable  Last Vitals:  Filed Vitals:   01/06/15 1505  BP:   Pulse: 97  Temp:   Resp: 24    Complications: No apparent anesthesia complications

## 2015-01-06 NOTE — ED Notes (Addendum)
X-ray at bedside

## 2015-01-06 NOTE — ED Notes (Signed)
Xray leaving bedside, finished scans.  OR called and they are ready for patient.  Family allowed to come to bedside and greet patient before he departs to OR.
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
## 2015-01-06 NOTE — ED Notes (Signed)
Dr. Magnus Ivan at bedside performing right chest tube.

## 2015-01-06 NOTE — Progress Notes (Signed)
Patient currently intubated, although is awake and interactive and follows commands.  I am reexamining him in order to make sure that he does not develop a compartment syndrome.  On examination he actively can dorsiflex and plantar flex both feet, specifically his great toe and all of the lesser toes do dorsiflex and plantar flex, and he reports that his sensation is intact and he denies significant pain with passive motion of the toes.  I unwrapped his garments, and reexamined his compartments clinically, and they appeared soft on both medial and lateral and anterior aspects of both legs, and did not appear any more firm than they did during the operating measurement of the compartments.  I did in fact bring in the compartment measurement kit, however given his capacity to interact with the exam, and to actively dorsiflex and plantar flex the feet on his own, and the clinical soft compartments on examination, I did not repeat compartment measurement testing. I was concerned about his high risk for compartment syndrome given his injuries as well as the fact that he remained intubated, but his mental function is currently adequate to support the absence of compartment syndrome and so we will not plan for any further surgical intervention tonight, I do not believe that he has developed compartment syndrome.  I have educated the nurses as well as the family to monitor, and they will keep [place] updated with his progress over the course of tonight.  Johnny Bridge, MD

## 2015-01-06 NOTE — ED Notes (Addendum)
--------------------------------------   Dr. Magnus Ivan notified of lack of pulses in bilateral lower extremities OR is ready for patient at this time and pt will be transported immediately after family has had time to speak with patient. --------------------------------------

## 2015-01-06 NOTE — ED Notes (Addendum)
  Calling lab about specimens, they are unprocessed at this time.

## 2015-01-06 NOTE — ED Notes (Signed)
Spoke with mother regarding patient status, patient gave permission to give information, states she is approx 5 hours away, patient aware

## 2015-01-06 NOTE — ED Notes (Signed)
Unable to obtain oral or axillary temp at this time 

## 2015-01-06 NOTE — ED Notes (Addendum)
Xray at bedside, phlebotomy drawing labs

## 2015-01-06 NOTE — Anesthesia Postprocedure Evaluation (Signed)
  Anesthesia Post-op Note  Patient: Juan [name]  Procedure(s) Performed: Procedure(s): IRRIGATION AND DEBRIDEMENT BILATERAL LEGS WITH APPLICATION EXTERNAL FIXATOR RIGHT  TIBIA AND APPLICATION EXTERNAL FIXATORS TO LEFT FEMUR  AND LEFT TIBIA  (Bilateral)  Patient Location: ICU  Anesthesia Type:General  Level of Consciousness: sedated, unresponsive and Patient remains intubated per anesthesia plan  Airway and Oxygen Therapy: Patient remains intubated per anesthesia plan  Post-op Pain: unable to evaluate, pt sedated  Post-op Assessment: Post-op Vital signs reviewed, Patient's Cardiovascular Status Stable and Respiratory Function Stable, on vent              Post-op Vital Signs: Reviewed and stable  Last Vitals:  Filed Vitals:   01/06/15 1505  BP:   Pulse: 97  Temp:   Resp: 24    Complications: No apparent anesthesia complications
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
## 2015-01-06 NOTE — Progress Notes (Signed)
Patient ID: Juan [name], male   DOB: May 24, 1990, 24 y.o.   MRN: [id number]  In ICU post up.  Hgb now 10.5 on up.   Abdomen seems ok on exam.  Will repeat in 4 hours.  If significantly down, will check a CT of the Abd/pelvis to evaluate the previously seen hemorrhage.
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
## 2015-01-06 NOTE — ED Notes (Addendum)
   2nd unit of blood started, [id number]

## 2015-01-06 NOTE — Anesthesia Preprocedure Evaluation (Addendum)
Anesthesia Evaluation  Patient identified by MRN, date of birth, ID band Patient awake    Reviewed: Allergy & Precautions, NPO status , Patient's Chart, lab work & pertinent test resultsPreop documentation limited or incomplete due to emergent nature of procedure.  Airway        Dental   Pulmonary neg pulmonary ROS,  breath sounds clear to auscultation        Cardiovascular negative cardio ROS  Rhythm:Regular Rate:Tachycardia     Neuro/Psych  Headaches, negative neurological ROS  negative psych ROS   GI/Hepatic negative GI ROS, Neg liver ROS,   Endo/Other  negative endocrine ROS  Renal/GU negative Renal ROS  negative genitourinary   Musculoskeletal negative musculoskeletal ROS (+)   Abdominal   Peds negative pediatric ROS (+)  Hematology negative hematology ROS (+)   Anesthesia Other Findings Chest Tube on Right  Reproductive/Obstetrics negative OB ROS                            Lab Results  Component Value Date   WBC PENDING 01/06/2015   HGB 13.3 01/06/2015   HCT 39.0 01/06/2015   MCV 88.8 01/06/2015   PLT 224 01/06/2015   Lab Results  Component Value Date   CREATININE 1.30* 01/06/2015   BUN 20 01/06/2015   NA 143 01/06/2015   K 3.0* 01/06/2015   CL 106 01/06/2015   Lab Results  Component Value Date   INR 1.40 01/06/2015    Anesthesia Physical Anesthesia Plan  ASA: I and emergent  Anesthesia Plan: General   Post-op Pain Management:    Induction: Intravenous, Rapid sequence and Cricoid pressure planned  Airway Management Planned: Oral ETT  Additional Equipment:   Intra-op Plan:   Post-operative Plan: Extubation in OR  Informed Consent: I have reviewed the patients History and Physical, chart, labs and discussed the procedure including the risks, benefits and alternatives for the proposed anesthesia with the patient or authorized representative who has indicated  his/her understanding and acceptance.   Dental advisory given, History available from chart only and Only emergency history available  Plan Discussed with: CRNA  Anesthesia Plan Comments:         Anesthesia Quick Evaluation

## 2015-01-06 NOTE — ED Notes (Signed)
   Patient transported to CT with this nurse and cluadia, NT
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
## 2015-01-07 ENCOUNTER — Inpatient Hospital Stay (HOSPITAL_COMMUNITY): Payer: 59

## 2015-01-07 ENCOUNTER — Encounter (HOSPITAL_COMMUNITY): Payer: Self-pay | Admitting: Orthopedic Surgery

## 2015-01-07 ENCOUNTER — Encounter (HOSPITAL_COMMUNITY): Payer: Self-pay | Admitting: Certified Registered"

## 2015-01-07 DIAGNOSIS — S2241XA Multiple fractures of ribs, right side, initial encounter for closed fracture: Secondary | ICD-10-CM | POA: Diagnosis present

## 2015-01-07 DIAGNOSIS — J9600 Acute respiratory failure, unspecified whether with hypoxia or hypercapnia: Secondary | ICD-10-CM | POA: Diagnosis not present

## 2015-01-07 DIAGNOSIS — S27322A Contusion of lung, bilateral, initial encounter: Secondary | ICD-10-CM | POA: Diagnosis present

## 2015-01-07 DIAGNOSIS — S32009A Unspecified fracture of unspecified lumbar vertebra, initial encounter for closed fracture: Secondary | ICD-10-CM | POA: Diagnosis present

## 2015-01-07 DIAGNOSIS — S060X9A Concussion with loss of consciousness of unspecified duration, initial encounter: Secondary | ICD-10-CM | POA: Diagnosis present

## 2015-01-07 DIAGNOSIS — S3282XA Multiple fractures of pelvis without disruption of pelvic ring, initial encounter for closed fracture: Secondary | ICD-10-CM | POA: Diagnosis present

## 2015-01-07 DIAGNOSIS — D62 Acute posthemorrhagic anemia: Secondary | ICD-10-CM | POA: Diagnosis not present

## 2015-01-07 DIAGNOSIS — R569 Unspecified convulsions: Secondary | ICD-10-CM

## 2015-01-07 DIAGNOSIS — S060XAA Concussion with loss of consciousness status unknown, initial encounter: Secondary | ICD-10-CM | POA: Diagnosis present

## 2015-01-07 DIAGNOSIS — K661 Hemoperitoneum: Secondary | ICD-10-CM | POA: Diagnosis present

## 2015-01-07 LAB — PREPARE RBC (CROSSMATCH)

## 2015-01-07 LAB — GLUCOSE, CAPILLARY: Glucose-Capillary: 121 mg/dL — ABNORMAL HIGH (ref 65–99)

## 2015-01-07 LAB — CBC
HCT: 24.1 % — ABNORMAL LOW (ref 39.0–52.0)
HCT: 22.1 % — ABNORMAL LOW (ref 39.0–52.0)
HCT: 20 % — ABNORMAL LOW (ref 39.0–52.0)
Hemoglobin: 8.8 g/dL — ABNORMAL LOW (ref 13.0–17.0)
Hemoglobin: 7.9 g/dL — ABNORMAL LOW (ref 13.0–17.0)
Hemoglobin: 7.1 g/dL — ABNORMAL LOW (ref 13.0–17.0)
MCH: 30.9 pg (ref 26.0–34.0)
MCH: 30.6 pg (ref 26.0–34.0)
MCH: 30.5 pg (ref 26.0–34.0)
MCHC: 36.5 g/dL — ABNORMAL HIGH (ref 30.0–36.0)
MCHC: 35.7 g/dL (ref 30.0–36.0)
MCHC: 35.5 g/dL (ref 30.0–36.0)
MCV: 84.6 fL (ref 78.0–100.0)
MCV: 85.7 fL (ref 78.0–100.0)
MCV: 85.8 fL (ref 78.0–100.0)
Platelets: 99 10*3/uL — ABNORMAL LOW (ref 150–400)
Platelets: 71 10*3/uL — ABNORMAL LOW (ref 150–400)
Platelets: 68 10*3/uL — ABNORMAL LOW (ref 150–400)
RBC: 2.85 MIL/uL — ABNORMAL LOW (ref 4.22–5.81)
RBC: 2.58 MIL/uL — ABNORMAL LOW (ref 4.22–5.81)
RBC: 2.33 MIL/uL — ABNORMAL LOW (ref 4.22–5.81)
RDW: 13.7 % (ref 11.5–15.5)
RDW: 13.8 % (ref 11.5–15.5)
RDW: 13.9 % (ref 11.5–15.5)
WBC: 8.6 10*3/uL (ref 4.0–10.5)
WBC: 7.1 10*3/uL (ref 4.0–10.5)
WBC: 7.1 10*3/uL (ref 4.0–10.5)

## 2015-01-07 LAB — DIC (DISSEMINATED INTRAVASCULAR COAGULATION) PANEL (NOT AT ARMC)
INR: 1.5 — ABNORMAL HIGH (ref 0.00–1.49)
Prothrombin Time: 18.2 seconds — ABNORMAL HIGH (ref 11.6–15.2)
aPTT: 40 seconds — ABNORMAL HIGH (ref 24–37)

## 2015-01-07 LAB — RAPID URINE DRUG SCREEN, HOSP PERFORMED
Amphetamines: NOT DETECTED
Barbiturates: NOT DETECTED
Benzodiazepines: POSITIVE — AB
Cocaine: NOT DETECTED
Opiates: NOT DETECTED
Tetrahydrocannabinol: POSITIVE — AB

## 2015-01-07 LAB — PREPARE FRESH FROZEN PLASMA

## 2015-01-07 LAB — PREPARE PLATELET PHERESIS: Unit division: 0

## 2015-01-07 LAB — BASIC METABOLIC PANEL
Anion gap: 5 (ref 5–15)
BUN: 13 mg/dL (ref 6–20)
CO2: 21 mmol/L — ABNORMAL LOW (ref 22–32)
Calcium: 7.3 mg/dL — ABNORMAL LOW (ref 8.9–10.3)
Chloride: 113 mmol/L — ABNORMAL HIGH (ref 101–111)
Creatinine, Ser: 0.97 mg/dL (ref 0.61–1.24)
GFR calc Af Amer: >60 mL/min (ref >60)
GFR calc non Af Amer: >60 mL/min (ref >60)
Glucose, Bld: 127 mg/dL — ABNORMAL HIGH (ref 65–99)
Potassium: 4.3 mmol/L (ref 3.5–5.1)
Sodium: 139 mmol/L (ref 135–145)

## 2015-01-07 LAB — POCT I-STAT 3, ART BLOOD GAS (G3+)
Acid-base deficit: 5 mmol/L — ABNORMAL HIGH (ref 0.0–2.0)
Bicarbonate: 20.2 mEq/L (ref 20.0–24.0)
O2 Saturation: 93 %
Patient temperature: 100.1
TCO2: 21 mmol/L (ref 0–100)
pCO2 arterial: 38.7 mmHg (ref 35.0–45.0)
pH, Arterial: 7.328 — ABNORMAL LOW (ref 7.350–7.450)
pO2, Arterial: 76 mmHg — ABNORMAL LOW (ref 80.0–100.0)

## 2015-01-07 LAB — PROTIME-INR
INR: 1.38 (ref 0.00–1.49)
Prothrombin Time: 17 seconds — ABNORMAL HIGH (ref 11.6–15.2)

## 2015-01-07 LAB — DIC (DISSEMINATED INTRAVASCULAR COAGULATION)PANEL
D-Dimer, Quant: 9.04 ug/mL-FEU — ABNORMAL HIGH (ref 0.00–0.48)
Fibrinogen: 380 mg/dL (ref 204–475)
Platelets: 67 10*3/uL — ABNORMAL LOW (ref 150–400)
Smear Review: NONE SEEN

## 2015-01-07 LAB — MAGNESIUM: Magnesium: 1.3 mg/dL — ABNORMAL LOW (ref 1.7–2.4)

## 2015-01-07 LAB — PHOSPHORUS: Phosphorus: 2.2 mg/dL — ABNORMAL LOW (ref 2.5–4.6)

## 2015-01-07 LAB — LACTIC ACID, PLASMA: Lactic Acid, Venous: 1.4 mmol/L (ref 0.5–2.0)

## 2015-01-07 LAB — MRSA PCR SCREENING: MRSA by PCR: NEGATIVE

## 2015-01-07 MED ORDER — LORAZEPAM 2 MG/ML IJ SOLN
INTRAMUSCULAR | Status: AC
  Filled 2015-01-07: qty 2

## 2015-01-07 MED ORDER — GENTAMICIN SULFATE 40 MG/ML IJ SOLN
500.0000 mg | INTRAVENOUS | Status: DC
  Administered 2015-01-07 – 2015-01-11 (×5): 500 mg via INTRAVENOUS
  Filled 2015-01-07 (×5): qty 12.5

## 2015-01-07 MED ORDER — PHENOBARBITAL SODIUM 130 MG/ML IJ SOLN
100.0000 mg | Freq: Once | INTRAMUSCULAR | Status: AC
  Administered 2015-01-07: 100 mg via INTRAVENOUS
  Filled 2015-01-07: qty 1

## 2015-01-07 MED ORDER — SODIUM CHLORIDE 0.9 % IV SOLN
500.0000 mg | Freq: Two times a day (BID) | INTRAVENOUS | Status: DC
  Administered 2015-01-07: 500 mg via INTRAVENOUS
  Filled 2015-01-07 (×2): qty 5

## 2015-01-07 MED ORDER — LORAZEPAM 2 MG/ML IJ SOLN
2.0000 mg | INTRAMUSCULAR | Status: DC | PRN
  Administered 2015-01-07 – 2015-01-31 (×23): 2 mg via INTRAVENOUS
  Filled 2015-01-07 (×26): qty 1

## 2015-01-07 MED ORDER — LORAZEPAM 2 MG/ML IJ SOLN
1.0000 mg | INTRAMUSCULAR | Status: DC | PRN
  Administered 2015-01-07: 1 mg via INTRAVENOUS
  Filled 2015-01-07 (×2): qty 1

## 2015-01-07 MED ORDER — SODIUM CHLORIDE 0.9 % IV SOLN
1000.0000 mg | Freq: Once | INTRAVENOUS | Status: AC
  Administered 2015-01-07: 1000 mg via INTRAVENOUS
  Filled 2015-01-07: qty 10

## 2015-01-07 MED ORDER — LORAZEPAM 2 MG/ML PO CONC: 4.0000 mg | Freq: Once | ORAL | Status: DC

## 2015-01-07 MED ORDER — MAGNESIUM SULFATE 2 GM/50ML IV SOLN
2.0000 g | Freq: Once | INTRAVENOUS | Status: AC
  Administered 2015-01-07: 2 g via INTRAVENOUS
  Filled 2015-01-07: qty 50

## 2015-01-07 MED ORDER — LORAZEPAM 2 MG/ML IJ SOLN
INTRAMUSCULAR | Status: AC
  Filled 2015-01-07: qty 1

## 2015-01-07 MED ORDER — SODIUM CHLORIDE 0.9 % IV SOLN
100.0000 mg | Freq: Two times a day (BID) | INTRAVENOUS | Status: DC
  Administered 2015-01-08 (×2): 100 mg via INTRAVENOUS
  Filled 2015-01-07 (×4): qty 10

## 2015-01-07 MED ORDER — LORAZEPAM 2 MG/ML IJ SOLN
4.0000 mg | Freq: Once | INTRAMUSCULAR | Status: AC
  Administered 2015-01-07: 4 mg via INTRAVENOUS

## 2015-01-07 MED ORDER — SODIUM CHLORIDE 0.9 % IV SOLN
1000.0000 mg | Freq: Two times a day (BID) | INTRAVENOUS | Status: DC
  Filled 2015-01-07: qty 10

## 2015-01-07 MED ORDER — SODIUM CHLORIDE 0.9 % IV BOLUS (SEPSIS)
1000.0000 mL | Freq: Once | INTRAVENOUS | Status: AC
  Administered 2015-01-07: 1000 mL via INTRAVENOUS

## 2015-01-07 MED ORDER — MIDAZOLAM HCL 2 MG/2ML IJ SOLN
2.0000 mg | INTRAMUSCULAR | Status: DC | PRN
  Administered 2015-01-07 – 2015-01-10 (×9): 2 mg via INTRAVENOUS
  Administered 2015-01-10 (×2): 4 mg via INTRAVENOUS
  Administered 2015-01-10 (×2): 2 mg via INTRAVENOUS
  Administered 2015-01-10: 4 mg via INTRAVENOUS
  Administered 2015-01-10: 2 mg via INTRAVENOUS
  Administered 2015-01-11 (×5): 4 mg via INTRAVENOUS
  Administered 2015-01-12: 2 mg via INTRAVENOUS
  Administered 2015-01-12 (×2): 4 mg via INTRAVENOUS
  Administered 2015-01-12: 2 mg via INTRAVENOUS
  Administered 2015-01-13: 4 mg via INTRAVENOUS
  Administered 2015-01-13 (×3): 2 mg via INTRAVENOUS
  Administered 2015-01-14: 4 mg via INTRAVENOUS
  Administered 2015-01-14: 2 mg via INTRAVENOUS
  Administered 2015-01-14: 4 mg via INTRAVENOUS
  Administered 2015-01-15: 2 mg via INTRAVENOUS
  Administered 2015-01-16: 4 mg via INTRAVENOUS
  Administered 2015-01-16 – 2015-01-18 (×2): 2 mg via INTRAVENOUS
  Administered 2015-01-18: 4 mg via INTRAVENOUS
  Administered 2015-01-19: 2 mg via INTRAVENOUS
  Administered 2015-01-19 (×2): 4 mg via INTRAVENOUS
  Administered 2015-01-19: 2 mg via INTRAVENOUS
  Administered 2015-01-19 (×3): 4 mg via INTRAVENOUS
  Administered 2015-01-19: 2 mg via INTRAVENOUS
  Administered 2015-01-19 (×2): 4 mg via INTRAVENOUS
  Administered 2015-01-20 (×6): 2 mg via INTRAVENOUS
  Administered 2015-01-21 (×4): 4 mg via INTRAVENOUS
  Administered 2015-01-21: 2 mg via INTRAVENOUS
  Administered 2015-01-22 (×4): 4 mg via INTRAVENOUS
  Administered 2015-01-22: 2 mg via INTRAVENOUS
  Administered 2015-01-22 – 2015-01-25 (×7): 4 mg via INTRAVENOUS
  Administered 2015-01-26 (×2): 2 mg via INTRAVENOUS
  Administered 2015-01-27 – 2015-01-28 (×3): 4 mg via INTRAVENOUS
  Administered 2015-01-29: 2 mg via INTRAVENOUS
  Administered 2015-01-30 – 2015-02-01 (×10): 4 mg via INTRAVENOUS
  Filled 2015-01-07: qty 4
  Filled 2015-01-07: qty 2
  Filled 2015-01-07: qty 4
  Filled 2015-01-07 (×4): qty 2
  Filled 2015-01-07 (×2): qty 4
  Filled 2015-01-07: qty 2
  Filled 2015-01-07 (×3): qty 4
  Filled 2015-01-07: qty 2
  Filled 2015-01-07: qty 4
  Filled 2015-01-07: qty 2
  Filled 2015-01-07: qty 4
  Filled 2015-01-07: qty 2
  Filled 2015-01-07 (×4): qty 4
  Filled 2015-01-07 (×2): qty 2
  Filled 2015-01-07 (×6): qty 4
  Filled 2015-01-07 (×2): qty 2
  Filled 2015-01-07: qty 4
  Filled 2015-01-07 (×2): qty 2
  Filled 2015-01-07: qty 4
  Filled 2015-01-07: qty 2
  Filled 2015-01-07 (×8): qty 4
  Filled 2015-01-07: qty 2
  Filled 2015-01-07 (×4): qty 4
  Filled 2015-01-07: qty 2
  Filled 2015-01-07 (×5): qty 4
  Filled 2015-01-07: qty 2
  Filled 2015-01-07: qty 4
  Filled 2015-01-07: qty 2
  Filled 2015-01-07: qty 4
  Filled 2015-01-07 (×2): qty 2
  Filled 2015-01-07: qty 4
  Filled 2015-01-07: qty 2
  Filled 2015-01-07: qty 4
  Filled 2015-01-07: qty 2
  Filled 2015-01-07 (×3): qty 4
  Filled 2015-01-07: qty 2
  Filled 2015-01-07: qty 4
  Filled 2015-01-07 (×4): qty 2
  Filled 2015-01-07: qty 4
  Filled 2015-01-07 (×5): qty 2
  Filled 2015-01-07: qty 4
  Filled 2015-01-07: qty 2
  Filled 2015-01-07 (×4): qty 4
  Filled 2015-01-07: qty 2
  Filled 2015-01-07 (×2): qty 4
  Filled 2015-01-07 (×3): qty 2

## 2015-01-07 MED ORDER — SODIUM CHLORIDE 0.9 % IV SOLN
1500.0000 mg | Freq: Two times a day (BID) | INTRAVENOUS | Status: DC
  Administered 2015-01-08 – 2015-01-21 (×27): 1500 mg via INTRAVENOUS
  Filled 2015-01-07 (×29): qty 15

## 2015-01-07 MED ORDER — MAGNESIUM SULFATE 2 GM/50ML IV SOLN
2.0000 g | Freq: Once | INTRAVENOUS | Status: DC
Start: 2015-01-07 — End: 2015-01-07

## 2015-01-07 MED ORDER — LORAZEPAM 2 MG/ML IJ SOLN
1.0000 mg | Freq: Once | INTRAMUSCULAR | Status: AC
  Administered 2015-01-07: 1 mg via INTRAVENOUS

## 2015-01-07 MED ORDER — SODIUM CHLORIDE 0.9 % IV SOLN
400.0000 mg | Freq: Once | INTRAVENOUS | Status: AC
  Administered 2015-01-07: 400 mg via INTRAVENOUS
  Filled 2015-01-07: qty 40

## 2015-01-07 NOTE — Progress Notes (Signed)
PT Cancellation Note  Patient Details Name: Juan [name] MRN: [id number] DOB: 1990/11/25   Cancelled Treatment:    Reason Eval/Treat Not Completed: Patient not medically ready. Patient remains on bedrest per orders and is on vent.  Will hold PT today. MD:  Please write orders for activity when appropriate for patient and PT will initiate evaluation at that time.   Vena Austria 01/07/2015, 8:07 AM Durenda Hurt. Renaldo Fiddler, [facility] Pager [phone]

## 2015-01-07 NOTE — Progress Notes (Signed)
Ativan given, seizure done

## 2015-01-07 NOTE — Progress Notes (Signed)
In to see patient at this time, patient noted to have a gaze towards the ceiling and shaking with no response to verbal command, patient is noted to be having a seizure with B/P 170-180's, SVT 150-160's and patient required 100% fiO2 for 5 min. 02 sats rebounded after the episode back on 50% fi02 to low 90's.  VS currently NSR 80, B/P 123/88, 02sats 94.  MD Andrey Campanile notified at this time. Will continue to monitor the patient closely. PA Leotis Shames has just arrived to bedside now.

## 2015-01-07 NOTE — Progress Notes (Signed)
Pt. Was transported to CT & back to 2SO7 without any complications.
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
## 2015-01-07 NOTE — Progress Notes (Signed)
Pt seizing

## 2015-01-07 NOTE — Progress Notes (Signed)
Utilization Review Completed.Juan [name] T8/21/2016  

## 2015-01-07 NOTE — Progress Notes (Signed)
Stat EEG completed, Dr Thad Ranger view while in progress.  Results pending.
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
## 2015-01-07 NOTE — Progress Notes (Signed)
Updates called to Dr. Leroy Kennedy and Dr. Andrey Campanile.

## 2015-01-07 NOTE — Plan of Care (Signed)
Problem: Phase I Progression Outcomes Goal: OOB as tolerated unless otherwise ordered Outcome: Not Progressing Pt is NWB on lower extremities, needs PT consult once more stable Goal: Voiding-avoid urinary catheter unless indicated Outcome: Not Met (add Reason) Foley placed for anticipated multiple surgical interventions and critical illness

## 2015-01-08 ENCOUNTER — Inpatient Hospital Stay (HOSPITAL_COMMUNITY): Payer: 59

## 2015-01-08 LAB — POCT I-STAT 3, ART BLOOD GAS (G3+)
Acid-base deficit: 4 mmol/L — ABNORMAL HIGH (ref 0.0–2.0)
Bicarbonate: 20.4 mEq/L (ref 20.0–24.0)
O2 Saturation: 96 %
Patient temperature: 99
TCO2: 21 mmol/L (ref 0–100)
pCO2 arterial: 34.6 mmHg — ABNORMAL LOW (ref 35.0–45.0)
pH, Arterial: 7.379 (ref 7.350–7.450)
pO2, Arterial: 83 mmHg (ref 80.0–100.0)

## 2015-01-08 LAB — CBC
HCT: 25 % — ABNORMAL LOW (ref 39.0–52.0)
HCT: 21.6 % — ABNORMAL LOW (ref 39.0–52.0)
Hemoglobin: 8.8 g/dL — ABNORMAL LOW (ref 13.0–17.0)
Hemoglobin: 7.6 g/dL — ABNORMAL LOW (ref 13.0–17.0)
MCH: 30.2 pg (ref 26.0–34.0)
MCH: 30.3 pg (ref 26.0–34.0)
MCHC: 35.2 g/dL (ref 30.0–36.0)
MCHC: 35.2 g/dL (ref 30.0–36.0)
MCV: 85.9 fL (ref 78.0–100.0)
MCV: 86.1 fL (ref 78.0–100.0)
Platelets: 61 10*3/uL — ABNORMAL LOW (ref 150–400)
Platelets: 63 10*3/uL — ABNORMAL LOW (ref 150–400)
RBC: 2.91 MIL/uL — ABNORMAL LOW (ref 4.22–5.81)
RBC: 2.51 MIL/uL — ABNORMAL LOW (ref 4.22–5.81)
RDW: 13.5 % (ref 11.5–15.5)
RDW: 13.9 % (ref 11.5–15.5)
WBC: 8 10*3/uL (ref 4.0–10.5)
WBC: 7 10*3/uL (ref 4.0–10.5)

## 2015-01-08 LAB — COMPREHENSIVE METABOLIC PANEL
ALT: 69 U/L — ABNORMAL HIGH (ref 17–63)
AST: 116 U/L — ABNORMAL HIGH (ref 15–41)
Albumin: 2.1 g/dL — ABNORMAL LOW (ref 3.5–5.0)
Alkaline Phosphatase: 40 U/L (ref 38–126)
Anion gap: 5 (ref 5–15)
BUN: 12 mg/dL (ref 6–20)
CO2: 22 mmol/L (ref 22–32)
Calcium: 7.5 mg/dL — ABNORMAL LOW (ref 8.9–10.3)
Chloride: 113 mmol/L — ABNORMAL HIGH (ref 101–111)
Creatinine, Ser: 1.02 mg/dL (ref 0.61–1.24)
GFR calc Af Amer: >60 mL/min (ref >60)
GFR calc non Af Amer: >60 mL/min (ref >60)
Glucose, Bld: 109 mg/dL — ABNORMAL HIGH (ref 65–99)
Potassium: 3.8 mmol/L (ref 3.5–5.1)
Sodium: 140 mmol/L (ref 135–145)
Total Bilirubin: 1.5 mg/dL — ABNORMAL HIGH (ref 0.3–1.2)
Total Protein: 4 g/dL — ABNORMAL LOW (ref 6.5–8.1)

## 2015-01-08 LAB — PROTIME-INR
INR: 1.34 (ref 0.00–1.49)
INR: 1.23 (ref 0.00–1.49)
Prothrombin Time: 16.7 seconds — ABNORMAL HIGH (ref 11.6–15.2)
Prothrombin Time: 15.7 seconds — ABNORMAL HIGH (ref 11.6–15.2)

## 2015-01-08 LAB — MAGNESIUM: Magnesium: 1.7 mg/dL (ref 1.7–2.4)

## 2015-01-08 LAB — PHOSPHORUS: Phosphorus: 1.3 mg/dL — ABNORMAL LOW (ref 2.5–4.6)

## 2015-01-08 LAB — LIPASE, BLOOD: Lipase: 11 U/L — ABNORMAL LOW (ref 22–51)

## 2015-01-08 LAB — PREPARE RBC (CROSSMATCH)

## 2015-01-08 LAB — BLOOD PRODUCT ORDER (VERBAL) VERIFICATION

## 2015-01-08 LAB — HIV ANTIBODY (ROUTINE TESTING W REFLEX): HIV Screen 4th Generation wRfx: NONREACTIVE

## 2015-01-08 LAB — LACTATE DEHYDROGENASE: LDH: 476 U/L — ABNORMAL HIGH (ref 98–192)

## 2015-01-08 LAB — GENTAMICIN LEVEL, RANDOM: Gentamicin Rm: 2.3 ug/mL

## 2015-01-08 MED ORDER — DEXTROSE-NACL 5-0.45 % IV SOLN: 100.0000 mL/h | INTRAVENOUS | Status: DC

## 2015-01-08 MED ORDER — SODIUM CHLORIDE 0.9 % IV SOLN
Freq: Once | INTRAVENOUS | Status: AC
  Administered 2015-01-10: 10:00:00 via INTRAVENOUS

## 2015-01-08 MED ORDER — CEFAZOLIN SODIUM-DEXTROSE 2-3 GM-% IV SOLR
2.0000 g | INTRAVENOUS | Status: AC
  Filled 2015-01-08: qty 50

## 2015-01-08 MED ORDER — POTASSIUM PHOSPHATES 15 MMOLE/5ML IV SOLN
40.0000 meq | Freq: Once | INTRAVENOUS | Status: AC
  Administered 2015-01-08: 40 meq via INTRAVENOUS
  Filled 2015-01-08: qty 9.09

## 2015-01-08 MED ORDER — SODIUM CHLORIDE 0.9 % IV SOLN
150.0000 mg | Freq: Two times a day (BID) | INTRAVENOUS | Status: DC
  Administered 2015-01-08 – 2015-01-15 (×14): 150 mg via INTRAVENOUS
  Filled 2015-01-08 (×32): qty 15

## 2015-01-08 MED ORDER — ACETAMINOPHEN 500 MG PO TABS
1000.0000 mg | ORAL_TABLET | ORAL | Status: AC
  Filled 2015-01-08: qty 2
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
## 2015-01-08 NOTE — Progress Notes (Signed)
Per Dr Dwain Sarna, not to give 2units PRBCs tonight, wait until they are sure he is going to surgery in the am and they can be given in the OR.

## 2015-01-08 NOTE — Progress Notes (Signed)
PT Cancellation Note  Patient Details Name: Juan [name] MRN: [id number] DOB: 1990-05-24   Cancelled Treatment:    Reason Eval/Treat Not Completed: Patient not medically ready. Pt with decreased responsiveness and to go for MRI. Will hold PT eval at this time and recheck tomorrow for medical readiness to participate.   Conni Slipper 01/08/2015, 10:03 AM   Conni Slipper, PT, DPT Acute Rehabilitation Services Pager: [phone]

## 2015-01-08 NOTE — Progress Notes (Signed)
I spoke with Dr. handy about this case. He recommends performing stabilization of his pelvis prior to long bone nailing. Dr. handy is planning on doing that tomorrow 8/23.  I will keep Juan [name] scheduled for Thursday 8/25 to perform IM nailing of bilateral tibia and possible recon nailing of the femur if it is not appropriate to do this during Dr. handy surgery.   [name] D

## 2015-01-08 NOTE — Progress Notes (Signed)
ANTIBIOTIC CONSULT NOTE - FOLLOW UP  Pharmacy Consult for Gentamicin Indication: Grade 3 Open Hip Fx  No Known Allergies  Patient Measurements: Height:  (167.6 cm) Weight: 160 lb (72.576 kg) IBW/kg (Calculated) : 63.8  Labs:  Recent Labs  01/06/15 0923  01/06/15 1550  01/07/15 0557 01/07/15 1400 01/07/15 2100  WBC  --   < > 8.0  < > 8.6 7.1 7.1  HGB 13.3  < > 10.6*  < > 8.8* 7.9* 7.1*  PLT  --   < > 112*  < > 99* 71* 67*  68*  CREATININE 1.30*  --  0.95  --  0.97  --   --   < > = values in this interval not displayed. Estimated Creatinine Clearance: 106.9 mL/min (by C-G formula based on Cr of 0.97).  Recent Labs  01/08/15 0132  GENTRANDOM 2.3    Microbiology: Recent Results (from the past 720 hour(s))  MRSA PCR Screening     Status: None   Collection Time: 01/07/15  8:58 PM  Result Value Ref Range Status   MRSA by PCR NEGATIVE NEGATIVE Final    Comment:        The GeneXpert MRSA Assay (FDA approved for NASAL specimens only), is one component of a comprehensive MRSA colonization surveillance program. It is not intended to diagnose MRSA infection nor to guide or monitor treatment for MRSA infections.    Assessment: 10.5 hour gentamicin random level is 2.3 which is within the acceptable range for continued dosing per the [place] nomogram. Scr has normalized.   Goal of Therapy:  Per [place] Nomogram  Plan:  -Continue gentamicin 500 mg IV q24h -F/U length of treatment  Abran Duke 01/08/2015,3:39 AM
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
## 2015-01-08 NOTE — Progress Notes (Signed)
Updates called to Dr. Lindie Spruce and Dr. Leroy Kennedy
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
## 2015-01-08 NOTE — Progress Notes (Signed)
EEG completed; results pending.    

## 2015-01-08 NOTE — Progress Notes (Signed)
OT Cancellation Note  Patient Details Name: Juan [name] MRN: [id number] DOB: [birth year]   Cancelled Treatment:    Reason Eval/Treat Not Completed: Patient not medically ready - Pt with decreased responsiveness and awaiting imaging.  Will reattempt as medically appropriate.   Angelene Giovanni [place], OTR/L [phone]  01/08/2015, 10:09 AM

## 2015-01-08 NOTE — Progress Notes (Signed)
Attempting to turn patient, sats dropped, HR and BP up, pain meds and sedation already on board

## 2015-01-08 NOTE — Procedures (Signed)
Central Venous Catheter Insertion Procedure Note Juan [name] [id number] 03/21/1991  Procedure: Insertion of Central Venous Catheter Indications: Drug and/or fluid administration  Procedure Details Consent: Risks of procedure as well as the alternatives and risks of each were explained to the (patient/caregiver).  Consent for procedure obtained. Time Out: Verified patient identification, verified procedure, site/side was marked, verified correct patient position, special equipment/implants available, medications/allergies/relevent history reviewed, required imaging and test results available.  Performed  Maximum sterile technique was used including antiseptics, cap, gloves, gown, hand hygiene, mask and sheet. Skin prep: Chlorhexidine; local anesthetic administered A antimicrobial bonded/coated triple lumen catheter was placed in the right subclavian vein using the Seldinger technique.  Evaluation Blood flow good Complications: No apparent complications Patient did tolerate procedure well. Chest X-ray ordered to verify placement.  CXR: pending.  Juan [name] 01/08/2015, 8:45 AM
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
## 2015-01-08 NOTE — Progress Notes (Signed)
Called and discussed case with Dr. Renaye Rakers and Dr. Carola Frost to assist in care.  Eulas Post, MD

## 2015-01-08 NOTE — Progress Notes (Addendum)
MRI was ordered stat at 0845 by MD [facility].  MD Amada Jupiter ordered an EEG to be done first. Nurse called MRI after EEG was completed at 0949 to verify a time the pt can be transported to MRI; at this time MRI told nurses they will send a transport at 1230. RT was made aware from her charge that another pt from [facility] needed to have an MRI first due to only one MRI vent; the RN was notified about situation. Nurse called MRI at 1549 to follow up on time; no answer from MRI. Nurse called back at 1551 and at this time MRI stated we will call you when we are ready.  Nurse called MRI at 1641 to follow up and MRI stated they will send a transport between 1730-1800. Nurse called MRI at 1813 to follow up and MRI stated they are sending a transport in 10 minutes. MD Amada Jupiter was made aware of delayed MRI and MD contacted MRI multiple times during shift. Route to MRI occured at 1845 with nurse, RT, and transport.

## 2015-01-08 NOTE — Progress Notes (Signed)
[facility] follow up     Follow up on cbc this pm     CBC    Component Value Date/Time   WBC 7.0 01/08/2015 1400   RBC 2.51* 01/08/2015 1400   HGB 7.6* 01/08/2015 1400   HCT 21.6* 01/08/2015 1400   PLT 63* 01/08/2015 1400   MCV 86.1 01/08/2015 1400   MCH 30.3 01/08/2015 1400   MCHC 35.2 01/08/2015 1400   RDW 13.9 01/08/2015 1400        Will give 2 units of PRBC's in preparation for OR tomorrow      Pt may need plts and FFP, that can be done in OR      Follow up on AM labs   Mearl Latin, PA-C [facility] [phone] (P) 01/08/2015 10:07 PM

## 2015-01-09 ENCOUNTER — Inpatient Hospital Stay (HOSPITAL_COMMUNITY): Payer: 59

## 2015-01-09 ENCOUNTER — Encounter (HOSPITAL_COMMUNITY): Payer: Self-pay | Admitting: Certified Registered Nurse Anesthetist

## 2015-01-09 ENCOUNTER — Ambulatory Visit (HOSPITAL_COMMUNITY): Payer: 59

## 2015-01-09 DIAGNOSIS — T791XXA Fat embolism (traumatic), initial encounter: Secondary | ICD-10-CM

## 2015-01-09 DIAGNOSIS — J9600 Acute respiratory failure, unspecified whether with hypoxia or hypercapnia: Secondary | ICD-10-CM

## 2015-01-09 LAB — BASIC METABOLIC PANEL
Anion gap: 5 (ref 5–15)
BUN: 9 mg/dL (ref 6–20)
CO2: 23 mmol/L (ref 22–32)
Calcium: 7.5 mg/dL — ABNORMAL LOW (ref 8.9–10.3)
Chloride: 108 mmol/L (ref 101–111)
Creatinine, Ser: 0.93 mg/dL (ref 0.61–1.24)
GFR calc Af Amer: >60 mL/min (ref >60)
GFR calc non Af Amer: >60 mL/min (ref >60)
Glucose, Bld: 109 mg/dL — ABNORMAL HIGH (ref 65–99)
Potassium: 3.6 mmol/L (ref 3.5–5.1)
Sodium: 136 mmol/L (ref 135–145)

## 2015-01-09 LAB — PREPARE FRESH FROZEN PLASMA
Unit division: 0
Unit division: 0

## 2015-01-09 LAB — CBC WITH DIFFERENTIAL/PLATELET
Basophils Absolute: 0 10*3/uL (ref 0.0–0.1)
Basophils Relative: 0 % (ref 0–1)
Eosinophils Absolute: 0.1 10*3/uL (ref 0.0–0.7)
Eosinophils Relative: 1 % (ref 0–5)
HCT: 19.7 % — ABNORMAL LOW (ref 39.0–52.0)
Hemoglobin: 7 g/dL — ABNORMAL LOW (ref 13.0–17.0)
Lymphocytes Relative: 11 % — ABNORMAL LOW (ref 12–46)
Lymphs Abs: 0.8 10*3/uL (ref 0.7–4.0)
MCH: 30.8 pg (ref 26.0–34.0)
MCHC: 35.5 g/dL (ref 30.0–36.0)
MCV: 86.8 fL (ref 78.0–100.0)
Monocytes Absolute: 0.6 10*3/uL (ref 0.1–1.0)
Monocytes Relative: 8 % (ref 3–12)
Neutro Abs: 5.8 10*3/uL (ref 1.7–7.7)
Neutrophils Relative %: 80 % — ABNORMAL HIGH (ref 43–77)
Platelets: 63 10*3/uL — ABNORMAL LOW (ref 150–400)
RBC: 2.27 MIL/uL — ABNORMAL LOW (ref 4.22–5.81)
RDW: 13.7 % (ref 11.5–15.5)
WBC: 7.3 10*3/uL (ref 4.0–10.5)

## 2015-01-09 LAB — BLOOD GAS, ARTERIAL
Acid-base deficit: 1.9 mmol/L (ref 0.0–2.0)
Bicarbonate: 21.9 mEq/L (ref 20.0–24.0)
Drawn by: 44138
FIO2: 1
MECHVT: 600 mL
O2 Saturation: 99.4 %
PEEP: 5 cmH2O
Patient temperature: 98.8
RATE: 20 resp/min
TCO2: 22.9 mmol/L (ref 0–100)
pCO2 arterial: 34.1 mmHg — ABNORMAL LOW (ref 35.0–45.0)
pH, Arterial: 7.423 (ref 7.350–7.450)
pO2, Arterial: 188 mmHg — ABNORMAL HIGH (ref 80.0–100.0)

## 2015-01-09 LAB — PROTIME-INR
INR: 1.25 (ref 0.00–1.49)
Prothrombin Time: 15.8 seconds — ABNORMAL HIGH (ref 11.6–15.2)

## 2015-01-09 LAB — TRIGLYCERIDES: Triglycerides: 137 mg/dL (ref <150)

## 2015-01-09 LAB — PREPARE RBC (CROSSMATCH)

## 2015-01-09 LAB — APTT: aPTT: 32 seconds (ref 24–37)

## 2015-01-09 LAB — LACTIC ACID, PLASMA: Lactic Acid, Venous: 1 mmol/L (ref 0.5–2.0)

## 2015-01-09 MED ORDER — SODIUM CHLORIDE 0.9 % IV SOLN
Freq: Once | INTRAVENOUS | Status: AC
  Administered 2015-01-09: 12:00:00 via INTRAVENOUS

## 2015-01-09 MED ORDER — ACETAMINOPHEN 325 MG PO TABS
650.0000 mg | ORAL_TABLET | ORAL | Status: DC | PRN
  Administered 2015-01-10 – 2015-01-31 (×4): 650 mg via NASOGASTRIC
  Filled 2015-01-09 (×6): qty 2

## 2015-01-09 MED ORDER — IOHEXOL 300 MG/ML  SOLN
100.0000 mL | Freq: Once | INTRAMUSCULAR | Status: AC | PRN
  Administered 2015-01-09: 100 mL via INTRAVENOUS
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
## 2015-01-09 NOTE — Progress Notes (Signed)
PT Cancellation Note  Patient Details Name: Juan [name] MRN: [id number] DOB: 1991/01/19   Cancelled Treatment:    Reason Eval/Treat Not Completed: Patient not medically ready; scheduled for OR today.  No where near ready for mobilizing.  Please reconsult PT when appropriate (including TBI team for coma stim when ready.)  Thanks   [facility] 01/09/2015, 8:58 AM  Sheran Lawless, PT [phone] 01/09/2015
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
## 2015-01-09 NOTE — Progress Notes (Signed)
Pt had another seizure at 0400.  IV Ativan administered, pt suctioned, 100% FiO2 given. Lasted approximately 2 minutes. ET suction yielded thick bloody secretions. After a few minutes of 100% FiO2 pt was able to be weaned back to 60%.

## 2015-01-09 NOTE — Progress Notes (Addendum)
Subjective: Patient continues to exhibit intermittent decorticate posturing concerning for seizures.   Exam: Filed Vitals:   01/09/15 0745  BP: 106/47  Pulse: 97  Temp:   Resp: 20   Gen: In bed, intubated Resp: ventilated Abd: soft, nt Skin: no petechial rash seen.   Neuro: MS: does not open eyes or follow commands RU:[id number] to threat bilaerally, perrl Motor: mild flexion bilaterally to noxious stimuli of the UE, LE externally fixated.  Sensory:as above.   Pertinent Labs: thrombocytopenia  Impression: 24 yo M with coma and seizures due to fat emboli syndrome. He has had significant injury to the brain on MRI, but I do think that there remains the possibility of good recovery though this is not definite. Especially considering his young age, I think that there could be some recovery over the long term.   I am not certain if his abnormal movements are seizure or posturing. Will get continuous EEG to capture event. Also will need repeat head imaging if exam changes, or will plan to repeat tomorrow.   Recommendations: 1) prolonged EEG 2) repeat head CT tomorrow or earlier if exam changes   Ritta Slot, MD [facility] [phone]  If 7pm- 7am, please page neurology on call as listed in AMION.
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
## 2015-01-09 NOTE — Progress Notes (Signed)
OT Cancellation Note  Patient Details Name: Juan [name] MRN: [id number] DOB: [birth year]   Cancelled Treatment:    Reason Eval/Treat Not Completed: Patient not medically ready. Pt intubated and sedated. Please reorder as appropriate. Signing off.  Evern Bio 01/09/2015, 9:49 AM  [phone]

## 2015-01-09 NOTE — Progress Notes (Signed)
Pt. Was transported to CT & back to 2S11 without any complications.

## 2015-01-09 NOTE — Progress Notes (Signed)
Surgery cancelled today for Dr. Carola Frost to work on his pelvis.  Awaiting trauma / neuro optimization.  Will continue to follow Carola Frost / Wandra Feinstein / Dion Saucier).   Eulas Post, MD

## 2015-01-09 NOTE — Progress Notes (Signed)
Patient ID: Juan [name], male   DOB: 12/27/1990, 24 y.o.   MRN: [id number]  CT abdomen actually looks better from hematoma around left colon and no contrast extravasation.  Will consult neurosurgery for opinion as to whether an ICP monitor is warranted.  I discussed situation with the family
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
## 2015-01-09 NOTE — Progress Notes (Signed)
  Echocardiogram 2D Echocardiogram has been performed.  Delcie Roch 01/09/2015, 3:55 PM

## 2015-01-10 ENCOUNTER — Inpatient Hospital Stay (HOSPITAL_COMMUNITY): Payer: 59

## 2015-01-10 LAB — CBC
HCT: 23.9 % — ABNORMAL LOW (ref 39.0–52.0)
Hemoglobin: 8.4 g/dL — ABNORMAL LOW (ref 13.0–17.0)
MCH: 30.8 pg (ref 26.0–34.0)
MCHC: 35.1 g/dL (ref 30.0–36.0)
MCV: 87.5 fL (ref 78.0–100.0)
Platelets: 84 10*3/uL — ABNORMAL LOW (ref 150–400)
RBC: 2.73 MIL/uL — ABNORMAL LOW (ref 4.22–5.81)
RDW: 13.5 % (ref 11.5–15.5)
WBC: 8.1 10*3/uL (ref 4.0–10.5)

## 2015-01-10 LAB — TYPE AND SCREEN
ABO/RH(D): A POS
Antibody Screen: NEGATIVE
Unit division: 0
Unit division: 0
Unit division: 0
Unit division: 0
Unit division: 0
Unit division: 0
Unit division: 0
Unit division: 0
Unit division: 0

## 2015-01-10 LAB — PREPARE FRESH FROZEN PLASMA
Unit division: 0
Unit division: 0

## 2015-01-10 LAB — BLOOD GAS, ARTERIAL
Acid-Base Excess: 2.1 mmol/L — ABNORMAL HIGH (ref 0.0–2.0)
Bicarbonate: 25.6 mEq/L — ABNORMAL HIGH (ref 20.0–24.0)
Drawn by: 270221
FIO2: 0.7
MECHVT: 540 mL
O2 Saturation: 98.9 %
PEEP: 8 cmH2O
Patient temperature: 101.8
RATE: 20 resp/min
TCO2: 26.7 mmol/L (ref 0–100)
pCO2 arterial: 39.5 mmHg (ref 35.0–45.0)
pH, Arterial: 7.436 (ref 7.350–7.450)
pO2, Arterial: 113 mmHg — ABNORMAL HIGH (ref 80.0–100.0)

## 2015-01-10 LAB — BASIC METABOLIC PANEL
Anion gap: 7 (ref 5–15)
BUN: 10 mg/dL (ref 6–20)
CO2: 24 mmol/L (ref 22–32)
Calcium: 7.7 mg/dL — ABNORMAL LOW (ref 8.9–10.3)
Chloride: 104 mmol/L (ref 101–111)
Creatinine, Ser: 0.84 mg/dL (ref 0.61–1.24)
GFR calc Af Amer: >60 mL/min (ref >60)
GFR calc non Af Amer: >60 mL/min (ref >60)
Glucose, Bld: 98 mg/dL (ref 65–99)
Potassium: 3.8 mmol/L (ref 3.5–5.1)
Sodium: 135 mmol/L (ref 135–145)

## 2015-01-10 LAB — CALCIUM, IONIZED: Calcium, Ionized, Serum: 4.8 mg/dL (ref 4.5–5.6)

## 2015-01-10 MED ORDER — FUROSEMIDE 10 MG/ML IJ SOLN
40.0000 mg | Freq: Two times a day (BID) | INTRAMUSCULAR | Status: AC
  Administered 2015-01-10 (×2): 40 mg via INTRAVENOUS
  Filled 2015-01-10 (×2): qty 4

## 2015-01-10 MED ORDER — PIVOT 1.5 CAL PO LIQD
1000.0000 mL | ORAL | Status: DC
  Administered 2015-01-10: 1000 mL
  Filled 2015-01-10 (×3): qty 1000
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
## 2015-01-10 NOTE — Progress Notes (Addendum)
Subjective: Continuous EEG did not record any seizures on my initial review of the button pushes, or elevated  Exam: Filed Vitals:   01/10/15 1000  BP: 130/68  Pulse: 114  Temp:   Resp: 22   Gen: In bed, intubated Resp: ventilated Abd: soft, nt Skin: no petechial rash seen.   Neuro: MS: does not open eyes or follow commands UE:AVWUJW to threat bilaerally, perrl Motor: mild flexion bilaterally to noxious stimuli of the UE, LE externally fixated.  Sensory:as above.   Pertinent Labs: thrombocytopenia  Impression: 24 yo M with coma and seizures due to fat emboli syndrome. He has had significant injury to the brain on MRI, but I do think that there remains the possibility of good recovery though this is not definite. Especially considering his young age, I think that there could be some recovery over the long term.   I am not certain if the movements seen earlier were posturing or seizure, but would continue AEDs at this time.   Recommendations: 1) repeat head CT today 2) continue vimpat and keppra.   Ritta Slot, MD [facility] [phone]  If 7pm- 7am, please page neurology on call as listed in AMION.
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
## 2015-01-10 NOTE — Progress Notes (Signed)
Pt transported to CT remained stable during transport. Pt began to desat down to 79% in CT, suctioned ET tube bloody secretions. Recruitment performed pt's oxygen saturation returned to 100%. Pt remained stable throughout the rest of CT & transport to unit.

## 2015-01-10 NOTE — Progress Notes (Signed)
ANTIBIOTIC CONSULT NOTE  Pharmacy Consult for Gentamicin Indication: grade 3 open hip fracture  No Known Allergies  Patient Measurements: Height:  (167.6 cm) Weight: 160 lb (72.576 kg) IBW/kg (Calculated) : 63.8  Vital Signs: Temp: 101.5 F (38.6 C) (08/24 1237) Temp Source: Axillary (08/24 1237) BP: 118/62 mmHg (08/24 1200) Pulse Rate: 113 (08/24 1200)  Labs:  Recent Labs  01/08/15 0430 01/08/15 1400 01/09/15 0455 01/10/15 0400  WBC 8.0 7.0 7.3 8.1  HGB 8.8* 7.6* 7.0* 8.4*  PLT 61* 63* 63* 84*  CREATININE 1.02  --  0.93 0.84   Estimated Creatinine Clearance: 123.4 mL/min (by C-G formula based on Cr of 0.84).  Medical History: Past Medical History  Diagnosis Date  . Open fracture of right tibia 01/06/2015  . Open fracture of shaft of left tibia, type III 01/06/2015  . Closed left subtrochanteric femur fracture 01/06/2015   Assessment:  24 yr old male continuing on gent extended-interval (pharmacy dosing) and Ancef for grade 3 open hip fracture. S/p I&D of both legs with application of external fixator right tibia and also to left femur and left tibia; also pelvic ring fx. Tmax/24h 101.8, WBC wnl, LA trend down. ORIF for 8/25. Scr 0.84 stable, Crcl>100    First dose was 5 mg/kg pre-op on 8/20, now dosing with 7 mg/kg = 500 mg.   10.5 hr gent level 8/22@0200 : 2.3 (within acceptable range per [facility]art[facility] nomogram)  Ancef 8/20>> Gent 8/20>>  No cultures   Goal of Therapy:  Gentamicin trough level <2 mcg/ml  Plan:  Ancef 2 gm IV q8hrs per MD Gentamicin 500 mg IV q24hrs (gent random was within range per hartford) Mon clinical progress, c/s, renal function, abx plan/LOT Natasha Bence level as indicated  Babs Bertin, PharmD Clinical Pharmacist Pager [phone] 01/10/2015 1:18 PM
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
## 2015-01-11 ENCOUNTER — Encounter (HOSPITAL_COMMUNITY): Admission: EM | Disposition: A | Discharge status: Rehabilitation Facility | Payer: Self-pay | Source: Home / Self Care

## 2015-01-11 ENCOUNTER — Inpatient Hospital Stay (HOSPITAL_COMMUNITY): Payer: 59

## 2015-01-11 LAB — CBC WITH DIFFERENTIAL/PLATELET
Basophils Absolute: 0 10*3/uL (ref 0.0–0.1)
Basophils Relative: 0 % (ref 0–1)
Eosinophils Absolute: 0.3 10*3/uL (ref 0.0–0.7)
Eosinophils Relative: 4 % (ref 0–5)
HCT: 24.7 % — ABNORMAL LOW (ref 39.0–52.0)
Hemoglobin: 8.6 g/dL — ABNORMAL LOW (ref 13.0–17.0)
Lymphocytes Relative: 14 % (ref 12–46)
Lymphs Abs: 1.2 10*3/uL (ref 0.7–4.0)
MCH: 30.7 pg (ref 26.0–34.0)
MCHC: 34.8 g/dL (ref 30.0–36.0)
MCV: 88.2 fL (ref 78.0–100.0)
Monocytes Absolute: 1.1 10*3/uL — ABNORMAL HIGH (ref 0.1–1.0)
Monocytes Relative: 13 % — ABNORMAL HIGH (ref 3–12)
Neutro Abs: 5.4 10*3/uL (ref 1.7–7.7)
Neutrophils Relative %: 68 % (ref 43–77)
Platelets: 119 10*3/uL — ABNORMAL LOW (ref 150–400)
RBC: 2.8 MIL/uL — ABNORMAL LOW (ref 4.22–5.81)
RDW: 13.2 % (ref 11.5–15.5)
WBC: 8 10*3/uL (ref 4.0–10.5)

## 2015-01-11 LAB — GLUCOSE, CAPILLARY
Glucose-Capillary: 102 mg/dL — ABNORMAL HIGH (ref 65–99)
Glucose-Capillary: 102 mg/dL — ABNORMAL HIGH (ref 65–99)
Glucose-Capillary: 113 mg/dL — ABNORMAL HIGH (ref 65–99)
Glucose-Capillary: 117 mg/dL — ABNORMAL HIGH (ref 65–99)
Glucose-Capillary: 122 mg/dL — ABNORMAL HIGH (ref 65–99)

## 2015-01-11 LAB — BASIC METABOLIC PANEL
Anion gap: 8 (ref 5–15)
BUN: 14 mg/dL (ref 6–20)
CO2: 31 mmol/L (ref 22–32)
Calcium: 8 mg/dL — ABNORMAL LOW (ref 8.9–10.3)
Chloride: 99 mmol/L — ABNORMAL LOW (ref 101–111)
Creatinine, Ser: 0.78 mg/dL (ref 0.61–1.24)
GFR calc Af Amer: >60 mL/min (ref >60)
GFR calc non Af Amer: >60 mL/min (ref >60)
Glucose, Bld: 101 mg/dL — ABNORMAL HIGH (ref 65–99)
Potassium: 3.3 mmol/L — ABNORMAL LOW (ref 3.5–5.1)
Sodium: 138 mmol/L (ref 135–145)

## 2015-01-11 SURGERY — OPEN REDUCTION INTERNAL FIXATION (ORIF) PELVIC FRACTURE
Anesthesia: General | Laterality: Right

## 2015-01-11 MED ORDER — IPRATROPIUM-ALBUTEROL 0.5-2.5 (3) MG/3ML IN SOLN
3.0000 mL | Freq: Four times a day (QID) | RESPIRATORY_TRACT | Status: DC
  Administered 2015-01-11 – 2015-02-01 (×85): 3 mL via RESPIRATORY_TRACT
  Filled 2015-01-11 (×86): qty 3

## 2015-01-11 MED ORDER — PIVOT 1.5 CAL PO LIQD
1000.0000 mL | ORAL | Status: DC
  Filled 2015-01-11: qty 1000

## 2015-01-11 MED ORDER — PIVOT 1.5 CAL PO LIQD
1000.0000 mL | ORAL | Status: DC
  Administered 2015-01-13: 1000 mL
  Filled 2015-01-11 (×5): qty 1000

## 2015-01-11 MED ORDER — PIVOT 1.5 CAL PO LIQD
1000.0000 mL | ORAL | Status: DC
  Filled 2015-01-11 (×2): qty 1000

## 2015-01-11 MED ORDER — POTASSIUM CHLORIDE 20 MEQ/15ML (10%) PO SOLN
40.0000 meq | Freq: Two times a day (BID) | ORAL | Status: AC
  Administered 2015-01-11 (×2): 40 meq via ORAL
  Filled 2015-01-11 (×3): qty 30

## 2015-01-11 MED ORDER — PIPERACILLIN-TAZOBACTAM 3.375 G IVPB
3.3750 g | Freq: Three times a day (TID) | INTRAVENOUS | Status: DC
  Administered 2015-01-11 – 2015-01-14 (×9): 3.375 g via INTRAVENOUS
  Filled 2015-01-11 (×13): qty 50

## 2015-01-11 NOTE — Clinical Documentation Improvement (Signed)
Trauma  Diagnosis clarification required: Notes document "left subtrochanteric CLOSED fracture" and "left subtrochanteric OPEN fracture".  Please exercise your independent, professional judgment when responding. A specific answer is not anticipated or expected.   Thank You, Beverley Fiedler [employer] [facility] [place]

## 2015-01-11 NOTE — Progress Notes (Signed)
Advanced OG tube 10cm per MD order. Patient tolerated well. Will continue to monitor  Domenica Fail, RN
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
## 2015-01-11 NOTE — Progress Notes (Signed)
Patient ID: Juan [name], male   DOB: 19-Aug-1990, 24 y.o.   MRN: [id number] Some further bloody secretions after BD treatments - from pulmonary contusions. Clear nasal drainage earlier likely sinus fluid. Has stopped for now.  Violeta Gelinas, MD, MPH, FACS Trauma: [phone] General Surgery: [phone]  01/11/2015 3:30 PM
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
## 2015-01-12 ENCOUNTER — Inpatient Hospital Stay (HOSPITAL_COMMUNITY): Payer: 59

## 2015-01-12 DIAGNOSIS — J9600 Acute respiratory failure, unspecified whether with hypoxia or hypercapnia: Secondary | ICD-10-CM

## 2015-01-12 LAB — GLUCOSE, CAPILLARY
Glucose-Capillary: 118 mg/dL — ABNORMAL HIGH (ref 65–99)
Glucose-Capillary: 133 mg/dL — ABNORMAL HIGH (ref 65–99)
Glucose-Capillary: 106 mg/dL — ABNORMAL HIGH (ref 65–99)
Glucose-Capillary: 105 mg/dL — ABNORMAL HIGH (ref 65–99)
Glucose-Capillary: 116 mg/dL — ABNORMAL HIGH (ref 65–99)

## 2015-01-12 LAB — BASIC METABOLIC PANEL
Anion gap: 4 — ABNORMAL LOW (ref 5–15)
BUN: 18 mg/dL (ref 6–20)
CO2: 30 mmol/L (ref 22–32)
Calcium: 8 mg/dL — ABNORMAL LOW (ref 8.9–10.3)
Chloride: 101 mmol/L (ref 101–111)
Creatinine, Ser: 0.8 mg/dL (ref 0.61–1.24)
GFR calc Af Amer: >60 mL/min (ref >60)
GFR calc non Af Amer: >60 mL/min (ref >60)
Glucose, Bld: 118 mg/dL — ABNORMAL HIGH (ref 65–99)
Potassium: 4.2 mmol/L (ref 3.5–5.1)
Sodium: 135 mmol/L (ref 135–145)

## 2015-01-12 LAB — TRIGLYCERIDES: Triglycerides: 105 mg/dL (ref <150)

## 2015-01-12 LAB — BLOOD GAS, ARTERIAL
Acid-Base Excess: 4.3 mmol/L — ABNORMAL HIGH (ref 0.0–2.0)
Bicarbonate: 28.3 mEq/L — ABNORMAL HIGH (ref 20.0–24.0)
Drawn by: 252031
FIO2: 0.4
MECHVT: 540 mL
O2 Saturation: 91.9 %
PEEP: 8 cmH2O
Patient temperature: 99.5
RATE: 20 resp/min
TCO2: 29.6 mmol/L (ref 0–100)
pCO2 arterial: 43.1 mmHg (ref 35.0–45.0)
pH, Arterial: 7.435 (ref 7.350–7.450)
pO2, Arterial: 65.2 mmHg — ABNORMAL LOW (ref 80.0–100.0)

## 2015-01-12 LAB — CBC
HCT: 24.2 % — ABNORMAL LOW (ref 39.0–52.0)
Hemoglobin: 8.1 g/dL — ABNORMAL LOW (ref 13.0–17.0)
MCH: 30.3 pg (ref 26.0–34.0)
MCHC: 33.5 g/dL (ref 30.0–36.0)
MCV: 90.6 fL (ref 78.0–100.0)
Platelets: 168 10*3/uL (ref 150–400)
RBC: 2.67 MIL/uL — ABNORMAL LOW (ref 4.22–5.81)
RDW: 13.9 % (ref 11.5–15.5)
WBC: 7.8 10*3/uL (ref 4.0–10.5)

## 2015-01-12 MED ORDER — FUROSEMIDE 10 MG/ML IJ SOLN
40.0000 mg | Freq: Once | INTRAMUSCULAR | Status: AC
  Administered 2015-01-12: 40 mg via INTRAVENOUS
  Filled 2015-01-12: qty 4

## 2015-01-12 MED ORDER — DEXMEDETOMIDINE HCL IN NACL 200 MCG/50ML IV SOLN
0.4000 ug/kg/h | INTRAVENOUS | Status: AC
  Administered 2015-01-12: 0.4 ug/kg/h via INTRAVENOUS
  Administered 2015-01-12 – 2015-01-13 (×8): 0.7 ug/kg/h via INTRAVENOUS
  Filled 2015-01-12 (×9): qty 50

## 2015-01-12 NOTE — Procedures (Signed)
Successful IVC FILTER insertion No comp Stable Full report in PACS
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
## 2015-01-12 NOTE — Progress Notes (Signed)
Follow up - Trauma and Critical Care  Patient Details:    Juan [name] is an 24 y.o. male.  Lines/tubes : Airway (Active)  Secured at (cm) 23 cm 01/12/2015  3:44 AM  Measured From Lips 01/12/2015  4:00 AM  Secured Location Left 01/12/2015  3:44 AM  Secured By [employer] 01/12/2015  3:44 AM  Tube Holder Repositioned Yes 01/12/2015  3:44 AM  Cuff Pressure (cm H2O) 26 cm H2O 01/12/2015  3:44 AM  Site Condition Dry 01/12/2015  3:44 AM     CVC Triple Lumen 01/08/15 Right Subclavian (Active)  Indication for Insertion or Continuance of Line Vasoactive infusions;Prolonged intravenous therapies 01/12/2015  4:00 AM  Site Assessment Clean;Dry;Intact 01/12/2015  4:00 AM  Proximal Lumen Status Infusing 01/12/2015  4:00 AM  Medial Infusing 01/12/2015  4:00 AM  Distal Lumen Status Flushed;Saline locked 01/12/2015  4:00 AM  Dressing Type Transparent;Occlusive 01/12/2015  4:00 AM  Dressing Status Clean;Dry;Intact;Antimicrobial disc in place 01/12/2015  4:00 AM  Line Care Connections checked and tightened 01/12/2015  4:00 AM  Dressing Change Due 01/15/15 01/11/2015  8:00 AM     Chest Tube 1 Right Pleural (Active)  Suction -20 cm H2O 01/12/2015  4:00 AM  Chest Tube Air Leak None 01/12/2015  4:00 AM  Patency Intervention Tip/tilt 01/11/2015  8:00 AM  Drainage Description Serosanguineous 01/12/2015  4:00 AM  Dressing Status Clean;Dry;Intact 01/12/2015  4:00 AM  Dressing Intervention Dressing reinforced 01/06/2015  9:14 AM  Site Assessment Clean;Dry;Intact 01/12/2015  4:00 AM  Surrounding Skin Unable to view 01/12/2015  4:00 AM  Output (mL) 50 mL 01/11/2015  8:00 PM     NG/OG Tube Orogastric (Active)  Placement Verification Auscultation;Xray 01/12/2015  4:00 AM  Site Assessment Clean;Dry;Intact 01/12/2015  4:00 AM  Status Infusing tube feed 01/12/2015  4:00 AM  Drainage Appearance Bile 01/11/2015  8:00 AM  Gastric Residual 20 mL 01/11/2015 11:00 PM  Intake (mL) 30 mL 01/11/2015  4:00 PM  Output (mL) 50 mL  01/10/2015  6:00 AM     Urethral Catheter A. Collins RN Straight-tip;Latex 14 Fr. (Active)  Indication for Insertion or Continuance of Catheter Aggressive IV diuresis;Peri-operative use for selective surgical procedure 01/12/2015  4:00 AM  Site Assessment Clean;Intact 01/12/2015  4:00 AM  Catheter Maintenance Bag below level of bladder;Catheter secured;Insertion date on drainage bag;No dependent loops;Drainage bag/tubing not touching floor;Seal intact 01/12/2015  4:00 AM  Collection Container Standard drainage bag 01/12/2015  4:00 AM  Securement Method Leg strap 01/12/2015  4:00 AM  Urinary Catheter Interventions Unclamped 01/12/2015  4:00 AM  Output (mL) 50 mL 01/12/2015  6:00 AM    Microbiology/Sepsis markers: Results for orders placed or performed during the hospital encounter of 01/06/15  MRSA PCR Screening     Status: None   Collection Time: 01/07/15  8:58 PM  Result Value Ref Range Status   MRSA by PCR NEGATIVE NEGATIVE Final    Comment:        The GeneXpert MRSA Assay (FDA approved for NASAL specimens only), is one component of a comprehensive MRSA colonization surveillance program. It is not intended to diagnose MRSA infection nor to guide or monitor treatment for MRSA infections.   Culture, respiratory (NON-Expectorated)     Status: None (Preliminary result)   Collection Time: 01/10/15  9:43 AM  Result Value Ref Range Status   Specimen Description TRACHEAL ASPIRATE  Final   Special Requests Normal  Final   Gram Stain   Final    RARE WBC  PRESENT,BOTH PMN AND MONONUCLEAR NO SQUAMOUS EPITHELIAL CELLS SEEN NO ORGANISMS SEEN Performed at [facility]    Culture NO GROWTH Performed at [facility]   Final   Report Status PENDING  Incomplete    Anti-infectives:  Anti-infectives    Start     Dose/Rate Route Frequency Ordered Stop   01/11/15 0915  piperacillin-tazobactam (ZOSYN) IVPB 3.375 g     3.375 g 12.5 mL/hr over 240 Minutes Intravenous 3 times per day  01/11/15 0845     01/09/15 1500  ceFAZolin (ANCEF) IVPB 2 g/50 mL premix     2 g 100 mL/hr over 30 Minutes Intravenous To [facility] 01/08/15 1208 01/10/15 1500   01/07/15 1400  gentamicin (GARAMYCIN) 500 mg in dextrose 5 % 100 mL IVPB  Status:  Discontinued     500 mg 112.5 mL/hr over 60 Minutes Intravenous Every 24 hours 01/07/15 1230 01/11/15 1555   01/07/15 0800  gentamicin (GARAMYCIN) 500 mg in dextrose 5 % 100 mL IVPB  Status:  Discontinued     500 mg 112.5 mL/hr over 60 Minutes Intravenous Every 24 hours 01/06/15 1956 01/07/15 1230   01/06/15 2000  ceFAZolin (ANCEF) IVPB 2 g/50 mL premix  Status:  Discontinued     2 g 100 mL/hr over 30 Minutes Intravenous Every 8 hours 01/06/15 1836 01/11/15 0848   01/06/15 1015  gentamicin (GARAMYCIN) 360 mg in dextrose 5 % 100 mL IVPB     5 mg/kg  72.6 kg 109 mL/hr over 60 Minutes Intravenous To Surgery 01/06/15 1011 01/06/15 1044   01/06/15 1000  gentamicin (GARAMYCIN) 300 mg in dextrose 5 % 50 mL IVPB  Status:  Discontinued     300 mg 115 mL/hr over 30 Minutes Intravenous  Once 01/06/15 0945 01/06/15 1011   01/06/15 0830  ceFAZolin (ANCEF) IVPB 2 g/50 mL premix     2 g 100 mL/hr over 30 Minutes Intravenous  Once 01/06/15 1610 01/06/15 1000      Best Practice/Protocols:  VTE Prophylaxis: Mechanical GI Prophylaxis: Proton Pump Inhibitor Continous Sedation  Consults: Treatment Team:  Teryl Lucy, MD Sheral Apley, MD Kym Groom, MD Myrene Galas, MD    Events:  Subjective:    Overnight Issues: Patient seems to be stabilizing.  No acute distress.  Will open eyes to verbal stimulus  Objective:  Vital signs for last 24 hours: Temp:  [99.3 F (37.4 C)-101.1 F (38.4 C)] 99.8 F (37.7 C) (08/26 0344) Pulse Rate:  [86-124] 102 (08/26 0600) Resp:  [19-34] 20 (08/26 0600) BP: (109-139)/(52-76) 118/61 mmHg (08/26 0600) SpO2:  [97 %-100 %] 100 % (08/26 0600) FiO2 (%):  [40 %-60 %] 40 % (08/26 0600) Weight:   [89 kg (196 lb 3.4 oz)] 89 kg (196 lb 3.4 oz) (08/26 0500)  Hemodynamic parameters for last 24 hours:    Intake/Output from previous day: 08/25 0701 - 08/26 0700 In: 3289.2 [I.V.:1646.7; NG/GT:1120; IV Piggyback:522.5] Out: 2955 [Urine:2835; Chest Tube:120]  Intake/Output this shift:    Vent settings for last 24 hours: Vent Mode:  [-] PRVC FiO2 (%):  [40 %-60 %] 40 % Set Rate:  [20 bmp] 20 bmp Vt Set:  [540 mL] 540 mL PEEP:  [8 cmH20] 8 cmH20 Plateau Pressure:  [21 cmH20-26 cmH20] 21 cmH20  Physical Exam:  General: no respiratory distress Neuro: nonfocal exam and RASS -1 Resp: clear to auscultation bilaterally CVS: regular rate and rhythm, S1, S2 normal, no murmur, click, rub or gallop and mildly tachycardic GI:  soft, nontender, BS WNL, no r/g and tolerating tube feedings. Extremities: edema 3+ and palpable pulses in the feet.  Results for orders placed or performed during the hospital encounter of 01/06/15 (from the past 24 hour(s))  Glucose, capillary     Status: Abnormal   Collection Time: 01/11/15  8:06 AM  Result Value Ref Range   Glucose-Capillary 102 (H) 65 - 99 mg/dL   Comment 1 Capillary Specimen    Comment 2 Notify RN   Glucose, capillary     Status: Abnormal   Collection Time: 01/11/15 11:39 AM  Result Value Ref Range   Glucose-Capillary 102 (H) 65 - 99 mg/dL   Comment 1 Capillary Specimen    Comment 2 Notify RN    Comment 3 Document in Chart   Glucose, capillary     Status: Abnormal   Collection Time: 01/11/15  4:26 PM  Result Value Ref Range   Glucose-Capillary 117 (H) 65 - 99 mg/dL   Comment 1 Capillary Specimen    Comment 2 Notify RN   Glucose, capillary     Status: Abnormal   Collection Time: 01/11/15  7:12 PM  Result Value Ref Range   Glucose-Capillary 122 (H) 65 - 99 mg/dL   Comment 1 Capillary Specimen    Comment 2 Notify RN    Comment 3 Document in Chart   Glucose, capillary     Status: Abnormal   Collection Time: 01/11/15 11:41 PM  Result  Value Ref Range   Glucose-Capillary 113 (H) 65 - 99 mg/dL   Comment 1 Capillary Specimen    Comment 2 Notify RN    Comment 3 Document in Chart   Blood gas, arterial     Status: Abnormal   Collection Time: 01/12/15  3:33 AM  Result Value Ref Range   FIO2 0.40    Delivery systems VENTILATOR    Mode PRESSURE REGULATED VOLUME CONTROL    VT 540 mL   LHR 20 resp/min   Peep/cpap 8.0 cm H20   pH, Arterial 7.435 7.350 - 7.450   pCO2 arterial 43.1 35.0 - 45.0 mmHg   pO2, Arterial 65.2 (L) 80.0 - 100.0 mmHg   Bicarbonate 28.3 (H) 20.0 - 24.0 mEq/L   TCO2 29.6 0 - 100 mmol/L   Acid-Base Excess 4.3 (H) 0.0 - 2.0 mmol/L   O2 Saturation 91.9 %   Patient temperature 99.5    Collection site RIGHT RADIAL    Drawn by [id number]    Sample type ARTERIAL DRAW    Allens test (pass/fail) PASS PASS  Glucose, capillary     Status: Abnormal   Collection Time: 01/12/15  3:35 AM  Result Value Ref Range   Glucose-Capillary 118 (H) 65 - 99 mg/dL   Comment 1 Capillary Specimen    Comment 2 Notify RN    Comment 3 Document in Chart   CBC     Status: Abnormal   Collection Time: 01/12/15  5:08 AM  Result Value Ref Range   WBC 7.8 4.0 - 10.5 K/uL   RBC 2.67 (L) 4.22 - 5.81 MIL/uL   Hemoglobin 8.1 (L) 13.0 - 17.0 g/dL   HCT 04.5 (L) 40.9 - 81.1 %   MCV 90.6 78.0 - 100.0 fL   MCH 30.3 26.0 - 34.0 pg   MCHC 33.5 30.0 - 36.0 g/dL   RDW 91.4 78.2 - 95.6 %   Platelets 168 150 - 400 K/uL  Basic metabolic panel     Status: Abnormal   Collection Time: 01/12/15  5:08 AM  Result Value Ref Range   Sodium 135 135 - 145 mmol/L   Potassium 4.2 3.5 - 5.1 mmol/L   Chloride 101 101 - 111 mmol/L   CO2 30 22 - 32 mmol/L   Glucose, Bld 118 (H) 65 - 99 mg/dL   BUN 18 6 - 20 mg/dL   Creatinine, Ser 1.61 0.61 - 1.24 mg/dL   Calcium 8.0 (L) 8.9 - 10.3 mg/dL   GFR calc non Af Amer >60 >60 mL/min   GFR calc Af Amer >60 >60 mL/min   Anion gap 4 (L) 5 - 15     Assessment/Plan:   NEURO  Altered Mental Status:  sedation    Plan: CPM with fentanyl only.  PULM  Atelectasis/collapse (focal) Pleural Effusion (etiolgy unknown)   Plan: Will diurese a bit again.  Needs IVC filter  CARDIO  Sinus Tachycardia   Plan: No specific treatment  RENAL  Urine output anf renal function are good.   Plan: CPM  GI  Pericolonic hematoma and bleeding.  Stable.  Tolerating tube feedings at goal.   Plan: CPM  ID  Being treated empirically for possible PNA, but cultures so far are negative.  If final cultures commeback negative, will stop antibiotics.   Plan: Continue empiric antibiotics  HEME  Anemia acute blood loss anemia and anemia of critical illness)   Plan: No blood for today.  ENDO No specific conditions   Plan: CPM  Global Issues  Patient has no PE/VTE prophylaxis.  Will not be able to get external fixation devices off until next week.  Will get removable IVF filter placed.  Diurese again with Lasix 40mg  x 1.  Decrease IVF somewhat.  Precedex for sedation.    LOS: 6 days   Additional comments:I reviewed the patient's new clinical lab test results. cbc/bmet and I reviewed the patients new imaging test results. cxr  Critical Care Total Time*: 30 Minutes  Emersyn Wyss 01/12/2015  *Care during the described time interval was provided by me and/or other providers on the critical care team.  I have reviewed this patient's available data, including medical history, events of note, physical examination and test results as part of my evaluation.

## 2015-01-12 NOTE — Sedation Documentation (Signed)
Pt. Currently on Precedex and fentanyl gtt from unit.
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
## 2015-01-13 ENCOUNTER — Inpatient Hospital Stay (HOSPITAL_COMMUNITY): Payer: 59

## 2015-01-13 DIAGNOSIS — J9600 Acute respiratory failure, unspecified whether with hypoxia or hypercapnia: Secondary | ICD-10-CM

## 2015-01-13 LAB — BASIC METABOLIC PANEL
Anion gap: 8 (ref 5–15)
BUN: 24 mg/dL — ABNORMAL HIGH (ref 6–20)
CO2: 28 mmol/L (ref 22–32)
Calcium: 7.9 mg/dL — ABNORMAL LOW (ref 8.9–10.3)
Chloride: 97 mmol/L — ABNORMAL LOW (ref 101–111)
Creatinine, Ser: 0.85 mg/dL (ref 0.61–1.24)
GFR calc Af Amer: >60 mL/min (ref >60)
GFR calc non Af Amer: >60 mL/min (ref >60)
Glucose, Bld: 135 mg/dL — ABNORMAL HIGH (ref 65–99)
Potassium: 4.3 mmol/L (ref 3.5–5.1)
Sodium: 133 mmol/L — ABNORMAL LOW (ref 135–145)

## 2015-01-13 LAB — TYPE AND SCREEN
ABO/RH(D): A POS
Antibody Screen: NEGATIVE
Unit division: 0
Unit division: 0

## 2015-01-13 LAB — CBC WITH DIFFERENTIAL/PLATELET
Basophils Absolute: 0 10*3/uL (ref 0.0–0.1)
Basophils Relative: 0 % (ref 0–1)
Eosinophils Absolute: 0.2 10*3/uL (ref 0.0–0.7)
Eosinophils Relative: 3 % (ref 0–5)
HCT: 22.5 % — ABNORMAL LOW (ref 39.0–52.0)
Hemoglobin: 7.7 g/dL — ABNORMAL LOW (ref 13.0–17.0)
Lymphocytes Relative: 13 % (ref 12–46)
Lymphs Abs: 1 10*3/uL (ref 0.7–4.0)
MCH: 31 pg (ref 26.0–34.0)
MCHC: 34.2 g/dL (ref 30.0–36.0)
MCV: 90.7 fL (ref 78.0–100.0)
Monocytes Absolute: 0.8 10*3/uL (ref 0.1–1.0)
Monocytes Relative: 11 % (ref 3–12)
Neutro Abs: 5.8 10*3/uL (ref 1.7–7.7)
Neutrophils Relative %: 73 % (ref 43–77)
Platelets: 218 10*3/uL (ref 150–400)
RBC: 2.48 MIL/uL — ABNORMAL LOW (ref 4.22–5.81)
RDW: 13.7 % (ref 11.5–15.5)
WBC: 7.9 10*3/uL (ref 4.0–10.5)

## 2015-01-13 LAB — GLUCOSE, CAPILLARY
Glucose-Capillary: 107 mg/dL — ABNORMAL HIGH (ref 65–99)
Glucose-Capillary: 108 mg/dL — ABNORMAL HIGH (ref 65–99)
Glucose-Capillary: 108 mg/dL — ABNORMAL HIGH (ref 65–99)
Glucose-Capillary: 110 mg/dL — ABNORMAL HIGH (ref 65–99)
Glucose-Capillary: 127 mg/dL — ABNORMAL HIGH (ref 65–99)
Glucose-Capillary: 140 mg/dL — ABNORMAL HIGH (ref 65–99)

## 2015-01-13 LAB — CULTURE, RESPIRATORY W GRAM STAIN: Culture: NO GROWTH

## 2015-01-13 LAB — CULTURE, RESPIRATORY: Special Requests: NORMAL

## 2015-01-13 MED ORDER — DEXMEDETOMIDINE HCL IN NACL 200 MCG/50ML IV SOLN
0.4000 ug/kg/h | INTRAVENOUS | Status: AC
  Administered 2015-01-13 – 2015-01-14 (×7): 0.7 ug/kg/h via INTRAVENOUS
  Filled 2015-01-13: qty 100
  Filled 2015-01-13 (×2): qty 50
  Filled 2015-01-13: qty 100
  Filled 2015-01-13: qty 50

## 2015-01-13 NOTE — Plan of Care (Signed)
Problem: Phase I Progression Outcomes Goal: Voiding-avoid urinary catheter unless indicated Outcome: Not Met (add Reason) Remains with foley.
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
## 2015-01-13 NOTE — Progress Notes (Signed)
Changed pt back to full vent support d/t pt became very agitated post advancing OET to 27 at lip (per MD order).  RN giving sedation, changed pt back to full vent support. RN aware.

## 2015-01-14 ENCOUNTER — Inpatient Hospital Stay (HOSPITAL_COMMUNITY): Payer: 59

## 2015-01-14 LAB — CBC
HCT: 23.1 % — ABNORMAL LOW (ref 39.0–52.0)
Hemoglobin: 7.7 g/dL — ABNORMAL LOW (ref 13.0–17.0)
MCH: 30.9 pg (ref 26.0–34.0)
MCHC: 33.3 g/dL (ref 30.0–36.0)
MCV: 92.8 fL (ref 78.0–100.0)
Platelets: 276 10*3/uL (ref 150–400)
RBC: 2.49 MIL/uL — ABNORMAL LOW (ref 4.22–5.81)
RDW: 13.8 % (ref 11.5–15.5)
WBC: 9.4 10*3/uL (ref 4.0–10.5)

## 2015-01-14 LAB — BLOOD GAS, ARTERIAL
Acid-Base Excess: 2.7 mmol/L — ABNORMAL HIGH (ref 0.0–2.0)
Bicarbonate: 26.5 mEq/L — ABNORMAL HIGH (ref 20.0–24.0)
Drawn by: 252031
FIO2: 0.4
MECHVT: 540 mL
O2 Saturation: 96.1 %
PEEP: 8 cmH2O
Patient temperature: 99.2
RATE: 20 resp/min
TCO2: 27.7 mmol/L (ref 0–100)
pCO2 arterial: 39.4 mmHg (ref 35.0–45.0)
pH, Arterial: 7.444 (ref 7.350–7.450)
pO2, Arterial: 83.4 mmHg (ref 80.0–100.0)

## 2015-01-14 LAB — BASIC METABOLIC PANEL
Anion gap: 7 (ref 5–15)
BUN: 23 mg/dL — ABNORMAL HIGH (ref 6–20)
CO2: 27 mmol/L (ref 22–32)
Calcium: 8 mg/dL — ABNORMAL LOW (ref 8.9–10.3)
Chloride: 101 mmol/L (ref 101–111)
Creatinine, Ser: 0.76 mg/dL (ref 0.61–1.24)
GFR calc Af Amer: >60 mL/min (ref >60)
GFR calc non Af Amer: >60 mL/min (ref >60)
Glucose, Bld: 129 mg/dL — ABNORMAL HIGH (ref 65–99)
Potassium: 4.4 mmol/L (ref 3.5–5.1)
Sodium: 135 mmol/L (ref 135–145)

## 2015-01-14 LAB — GLUCOSE, CAPILLARY
Glucose-Capillary: 112 mg/dL — ABNORMAL HIGH (ref 65–99)
Glucose-Capillary: 113 mg/dL — ABNORMAL HIGH (ref 65–99)
Glucose-Capillary: 121 mg/dL — ABNORMAL HIGH (ref 65–99)
Glucose-Capillary: 133 mg/dL — ABNORMAL HIGH (ref 65–99)
Glucose-Capillary: 139 mg/dL — ABNORMAL HIGH (ref 65–99)
Glucose-Capillary: 90 mg/dL (ref 65–99)

## 2015-01-14 LAB — PHOSPHORUS: Phosphorus: 3.5 mg/dL (ref 2.5–4.6)

## 2015-01-14 LAB — MAGNESIUM: Magnesium: 2.1 mg/dL (ref 1.7–2.4)

## 2015-01-14 LAB — PREPARE RBC (CROSSMATCH)

## 2015-01-14 MED ORDER — DEXMEDETOMIDINE HCL IN NACL 200 MCG/50ML IV SOLN
0.4000 ug/kg/h | INTRAVENOUS | Status: AC
  Administered 2015-01-14 – 2015-01-15 (×8): 0.7 ug/kg/h via INTRAVENOUS
  Filled 2015-01-14 (×5): qty 50
  Filled 2015-01-14: qty 100

## 2015-01-14 MED ORDER — SODIUM CHLORIDE 0.9 % IV SOLN
Freq: Once | INTRAVENOUS | Status: AC
  Administered 2015-01-14: 12:00:00 via INTRAVENOUS

## 2015-01-14 MED ORDER — PIVOT 1.5 CAL PO LIQD
1000.0000 mL | ORAL | Status: DC
  Administered 2015-01-16 – 2015-01-21 (×7): 1000 mL
  Filled 2015-01-14 (×9): qty 1000
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
## 2015-01-14 NOTE — Plan of Care (Signed)
Problem: Phase I Progression Outcomes Goal: Other Phase I Outcomes/Goals Outcome: Not Met (add Reason) Patient weaning on vent but not able to extubate at this time.
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
## 2015-01-14 NOTE — Progress Notes (Signed)
RN called d/t pt having seizure,& RN admin sedation.  Pt placed back on full vent support.

## 2015-01-15 ENCOUNTER — Encounter (HOSPITAL_COMMUNITY): Admission: EM | Disposition: A | Discharge status: Rehabilitation Facility | Payer: Self-pay | Source: Home / Self Care

## 2015-01-15 ENCOUNTER — Inpatient Hospital Stay (HOSPITAL_COMMUNITY): Payer: 59

## 2015-01-15 ENCOUNTER — Encounter (HOSPITAL_COMMUNITY): Payer: Self-pay | Admitting: Certified Registered"

## 2015-01-15 ENCOUNTER — Inpatient Hospital Stay (HOSPITAL_COMMUNITY): Payer: 59 | Admitting: Anesthesiology

## 2015-01-15 HISTORY — PX: FEMUR IM NAIL: SHX1597

## 2015-01-15 HISTORY — PX: ORIF PELVIC FRACTURE: SHX2128

## 2015-01-15 HISTORY — PX: I & D EXTREMITY: SHX5045

## 2015-01-15 HISTORY — PX: TIBIA IM NAIL INSERTION: SHX2516

## 2015-01-15 LAB — POCT I-STAT EG7
Acid-Base Excess: 1 mmol/L (ref 0.0–2.0)
Bicarbonate: 26.9 mEq/L — ABNORMAL HIGH (ref 20.0–24.0)
Calcium, Ion: 1.18 mmol/L (ref 1.12–1.23)
HCT: 23 % — ABNORMAL LOW (ref 39.0–52.0)
Hemoglobin: 7.8 g/dL — ABNORMAL LOW (ref 13.0–17.0)
O2 Saturation: 81 %
Patient temperature: 35.7
Potassium: 4.2 mmol/L (ref 3.5–5.1)
Sodium: 134 mmol/L — ABNORMAL LOW (ref 135–145)
TCO2: 28 mmol/L (ref 0–100)
pCO2, Ven: 44.2 mmHg — ABNORMAL LOW (ref 45.0–50.0)
pH, Ven: 7.387 — ABNORMAL HIGH (ref 7.250–7.300)
pO2, Ven: 43 mmHg (ref 30.0–45.0)

## 2015-01-15 LAB — POCT I-STAT 4, (NA,K, GLUC, HGB,HCT)
Glucose, Bld: 114 mg/dL — ABNORMAL HIGH (ref 65–99)
Glucose, Bld: 117 mg/dL — ABNORMAL HIGH (ref 65–99)
HCT: 24 % — ABNORMAL LOW (ref 39.0–52.0)
HCT: 31 % — ABNORMAL LOW (ref 39.0–52.0)
Hemoglobin: 8.2 g/dL — ABNORMAL LOW (ref 13.0–17.0)
Hemoglobin: 10.5 g/dL — ABNORMAL LOW (ref 13.0–17.0)
Potassium: 4.7 mmol/L (ref 3.5–5.1)
Potassium: 4.1 mmol/L (ref 3.5–5.1)
Sodium: 135 mmol/L (ref 135–145)
Sodium: 134 mmol/L — ABNORMAL LOW (ref 135–145)

## 2015-01-15 LAB — GLUCOSE, CAPILLARY
Glucose-Capillary: 100 mg/dL — ABNORMAL HIGH (ref 65–99)
Glucose-Capillary: 102 mg/dL — ABNORMAL HIGH (ref 65–99)
Glucose-Capillary: 104 mg/dL — ABNORMAL HIGH (ref 65–99)
Glucose-Capillary: 106 mg/dL — ABNORMAL HIGH (ref 65–99)
Glucose-Capillary: 99 mg/dL (ref 65–99)

## 2015-01-15 LAB — CBC
HCT: 23.9 % — ABNORMAL LOW (ref 39.0–52.0)
Hemoglobin: 7.9 g/dL — ABNORMAL LOW (ref 13.0–17.0)
MCH: 30.5 pg (ref 26.0–34.0)
MCHC: 33.1 g/dL (ref 30.0–36.0)
MCV: 92.3 fL (ref 78.0–100.0)
Platelets: 401 10*3/uL — ABNORMAL HIGH (ref 150–400)
RBC: 2.59 MIL/uL — ABNORMAL LOW (ref 4.22–5.81)
RDW: 14 % (ref 11.5–15.5)
WBC: 13.2 10*3/uL — ABNORMAL HIGH (ref 4.0–10.5)

## 2015-01-15 LAB — BASIC METABOLIC PANEL
Anion gap: 6 (ref 5–15)
BUN: 20 mg/dL (ref 6–20)
CO2: 26 mmol/L (ref 22–32)
Calcium: 8.4 mg/dL — ABNORMAL LOW (ref 8.9–10.3)
Chloride: 104 mmol/L (ref 101–111)
Creatinine, Ser: 0.74 mg/dL (ref 0.61–1.24)
GFR calc Af Amer: >60 mL/min (ref >60)
GFR calc non Af Amer: >60 mL/min (ref >60)
Glucose, Bld: 114 mg/dL — ABNORMAL HIGH (ref 65–99)
Potassium: 4.8 mmol/L (ref 3.5–5.1)
Sodium: 136 mmol/L (ref 135–145)

## 2015-01-15 LAB — PREPARE RBC (CROSSMATCH)

## 2015-01-15 SURGERY — OPEN REDUCTION INTERNAL FIXATION (ORIF) PELVIC FRACTURE
Anesthesia: General | Site: Leg Upper | Laterality: Left

## 2015-01-15 SURGERY — IRRIGATION AND DEBRIDEMENT EXTREMITY
Anesthesia: General | Laterality: Left

## 2015-01-15 MED ORDER — CEFAZOLIN SODIUM-DEXTROSE 2-3 GM-% IV SOLR
2.0000 g | Freq: Three times a day (TID) | INTRAVENOUS | Status: AC
Start: 2015-01-16 — End: 2015-01-17
  Administered 2015-01-16 – 2015-01-17 (×5): 2 g via INTRAVENOUS
  Filled 2015-01-15 (×5): qty 50

## 2015-01-15 MED ORDER — FENTANYL CITRATE (PF) 250 MCG/5ML IJ SOLN
INTRAMUSCULAR | Status: AC
  Filled 2015-01-15: qty 5

## 2015-01-15 MED ORDER — MIDAZOLAM HCL 5 MG/5ML IJ SOLN
INTRAMUSCULAR | Status: DC | PRN
  Administered 2015-01-15: 4 mg via INTRAVENOUS

## 2015-01-15 MED ORDER — MIDAZOLAM HCL 2 MG/2ML IJ SOLN
INTRAMUSCULAR | Status: AC
  Filled 2015-01-15: qty 4

## 2015-01-15 MED ORDER — HYDROMORPHONE HCL 1 MG/ML IJ SOLN
INTRAMUSCULAR | Status: DC | PRN
  Administered 2015-01-15: 1 mg via INTRAVENOUS
  Administered 2015-01-15: 2 mg via INTRAVENOUS

## 2015-01-15 MED ORDER — SODIUM CHLORIDE 0.9 % IR SOLN
Status: DC | PRN
  Administered 2015-01-15 (×3): 3000 mL

## 2015-01-15 MED ORDER — PROPOFOL 10 MG/ML IV BOLUS
INTRAVENOUS | Status: AC
  Filled 2015-01-15: qty 20

## 2015-01-15 MED ORDER — SODIUM CHLORIDE 0.9 % IV SOLN
Freq: Once | INTRAVENOUS | Status: AC
  Administered 2015-01-15: 09:00:00 via INTRAVENOUS

## 2015-01-15 MED ORDER — HYDROMORPHONE HCL 1 MG/ML IJ SOLN
INTRAMUSCULAR | Status: AC
  Filled 2015-01-15: qty 1

## 2015-01-15 MED ORDER — 0.9 % SODIUM CHLORIDE (POUR BTL) OPTIME
TOPICAL | Status: DC | PRN
  Administered 2015-01-15 (×2): 1000 mL

## 2015-01-15 MED ORDER — DEXMEDETOMIDINE HCL IN NACL 200 MCG/50ML IV SOLN
0.4000 ug/kg/h | INTRAVENOUS | Status: DC
  Administered 2015-01-16: 0.7 ug/kg/h via INTRAVENOUS
  Filled 2015-01-15 (×2): qty 50

## 2015-01-15 MED ORDER — VECURONIUM BROMIDE 10 MG IV SOLR
INTRAVENOUS | Status: DC | PRN
  Administered 2015-01-15: 2 mg via INTRAVENOUS
  Administered 2015-01-15: 4 mg via INTRAVENOUS
  Administered 2015-01-15: 2 mg via INTRAVENOUS
  Administered 2015-01-15: 3 mg via INTRAVENOUS
  Administered 2015-01-15: 4 mg via INTRAVENOUS
  Administered 2015-01-15 (×4): 3 mg via INTRAVENOUS

## 2015-01-15 MED ORDER — PROPOFOL 10 MG/ML IV BOLUS
INTRAVENOUS | Status: DC | PRN
  Administered 2015-01-15: 50 mg via INTRAVENOUS

## 2015-01-15 MED ORDER — ROCURONIUM BROMIDE 50 MG/5ML IV SOLN
INTRAVENOUS | Status: AC
  Filled 2015-01-15: qty 1

## 2015-01-15 MED ORDER — ROCURONIUM BROMIDE 100 MG/10ML IV SOLN
INTRAVENOUS | Status: DC | PRN
  Administered 2015-01-15: 30 mg via INTRAVENOUS
  Administered 2015-01-15: 20 mg via INTRAVENOUS

## 2015-01-15 MED ORDER — SODIUM CHLORIDE 0.9 % IV SOLN
Freq: Once | INTRAVENOUS | Status: AC
  Administered 2015-01-15: 18:00:00 via INTRAVENOUS

## 2015-01-15 MED ORDER — CEFAZOLIN SODIUM-DEXTROSE 2-3 GM-% IV SOLR
INTRAVENOUS | Status: AC
  Filled 2015-01-15: qty 50

## 2015-01-15 MED ORDER — SODIUM CHLORIDE 0.9 % IV SOLN
200.0000 mg | INTRAVENOUS | Status: AC
  Administered 2015-01-15: 200 mg via INTRAVENOUS
  Filled 2015-01-15: qty 20

## 2015-01-15 MED ORDER — LACTATED RINGERS IV SOLN
INTRAVENOUS | Status: DC | PRN
  Administered 2015-01-15 (×3): via INTRAVENOUS

## 2015-01-15 MED ORDER — FENTANYL CITRATE (PF) 100 MCG/2ML IJ SOLN
INTRAMUSCULAR | Status: DC | PRN
  Administered 2015-01-15: 100 ug via INTRAVENOUS
  Administered 2015-01-15: 50 ug via INTRAVENOUS
  Administered 2015-01-15: 100 ug via INTRAVENOUS
  Administered 2015-01-15: 150 ug via INTRAVENOUS
  Administered 2015-01-15: 50 ug via INTRAVENOUS
  Administered 2015-01-15: 250 ug via INTRAVENOUS
  Administered 2015-01-15: 150 ug via INTRAVENOUS
  Administered 2015-01-15: 100 ug via INTRAVENOUS
  Administered 2015-01-15: 150 ug via INTRAVENOUS
  Administered 2015-01-15 (×2): 250 ug via INTRAVENOUS
  Administered 2015-01-15: 50 ug via INTRAVENOUS
  Administered 2015-01-15: 250 ug via INTRAVENOUS
  Administered 2015-01-15: 100 ug via INTRAVENOUS
  Administered 2015-01-15 (×2): 250 ug via INTRAVENOUS
  Administered 2015-01-15 (×2): 100 ug via INTRAVENOUS
  Administered 2015-01-15: 50 ug via INTRAVENOUS
  Administered 2015-01-15: 250 ug via INTRAVENOUS
  Administered 2015-01-15: 150 ug via INTRAVENOUS
  Administered 2015-01-15: 100 ug via INTRAVENOUS

## 2015-01-15 MED ORDER — CEFAZOLIN SODIUM-DEXTROSE 2-3 GM-% IV SOLR
INTRAVENOUS | Status: DC | PRN
  Administered 2015-01-15 (×3): 2 g via INTRAVENOUS

## 2015-01-15 SURGICAL SUPPLY — 131 items
APPLIER CLIP 11 MED OPEN (CLIP) ×4
APR CLP MED 11 20 MLT OPN (CLIP) ×3
BANDAGE ELASTIC 4 VELCRO ST LF (GAUZE/BANDAGES/DRESSINGS) ×4 IMPLANT
BANDAGE ELASTIC 6 VELCRO ST LF (GAUZE/BANDAGES/DRESSINGS) ×4 IMPLANT
BIT DRILL 4.9 CANNULATED (BIT) ×3
BIT DRILL AO GAMMA 4.2X180 (BIT) ×1 IMPLANT
BIT DRILL AO GAMMA 4.2X340 (BIT) ×1 IMPLANT
BIT DRILL AO MATTA 2.5MX230M (BIT) IMPLANT
BIT DRILL AO STRL 3.5X130MM (BIT) IMPLANT
BIT DRILL CANN QC 4.9 LRG (BIT) IMPLANT
BLADE SURG 10 STRL SS (BLADE) ×9 IMPLANT
BLADE SURG ROTATE 9660 (MISCELLANEOUS) ×2 IMPLANT
BNDG COHESIVE 4X5 TAN STRL (GAUZE/BANDAGES/DRESSINGS) ×4 IMPLANT
BNDG GAUZE ELAST 4 BULKY (GAUZE/BANDAGES/DRESSINGS) ×5 IMPLANT
BRUSH SCRUB DISP (MISCELLANEOUS) ×8 IMPLANT
CLIP APPLIE 11 MED OPEN (CLIP) IMPLANT
COVER MAYO STAND STRL (DRAPES) ×1 IMPLANT
COVER SURGICAL LIGHT HANDLE (MISCELLANEOUS) ×8 IMPLANT
DRAIN CHANNEL 15F RND FF W/TCR (WOUND CARE) ×4 IMPLANT
DRAPE BILATERAL SPLIT (DRAPES) ×2 IMPLANT
DRAPE C-ARM 42X72 X-RAY (DRAPES) ×4 IMPLANT
DRAPE C-ARMOR (DRAPES) ×5 IMPLANT
DRAPE INCISE IOBAN 66X45 STRL (DRAPES) ×7 IMPLANT
DRAPE ORTHO SPLIT 77X108 STRL (DRAPES) ×8
DRAPE PROXIMA HALF (DRAPES) ×1 IMPLANT
DRAPE SURG ORHT 6 SPLT 77X108 (DRAPES) IMPLANT
DRAPE U-SHAPE 47X51 STRL (DRAPES) ×8 IMPLANT
DRILL AO STERILE 3.5X130MM (BIT) ×4
DRILL BIT AO MATTA 2.5MX230M (BIT) ×4
DRILL BIT ASNIS (BIT) ×1 IMPLANT
DRILL BIT CANNULATED 4.9 (BIT) ×4
DRSG ADAPTIC 3X8 NADH LF (GAUZE/BANDAGES/DRESSINGS) ×4 IMPLANT
DRSG MEPILEX BORDER 4X4 (GAUZE/BANDAGES/DRESSINGS) ×2 IMPLANT
DRSG MEPILEX BORDER 4X8 (GAUZE/BANDAGES/DRESSINGS) ×3 IMPLANT
DRSG MEPITEL 4X7.2 (GAUZE/BANDAGES/DRESSINGS) ×1 IMPLANT
DRSG PAD ABDOMINAL 8X10 ST (GAUZE/BANDAGES/DRESSINGS) ×7 IMPLANT
DRSG VAC ATS MED SENSATRAC (GAUZE/BANDAGES/DRESSINGS) ×1 IMPLANT
ELECT REM PT RETURN 9FT ADLT (ELECTROSURGICAL) ×4
ELECTRODE REM PT RTRN 9FT ADLT (ELECTROSURGICAL) ×3 IMPLANT
EVACUATOR 1/8 PVC DRAIN (DRAIN) IMPLANT
EVACUATOR SILICONE 100CC (DRAIN) ×4 IMPLANT
FACESHIELD STD STERILE (MASK) ×1 IMPLANT
GAUZE SPONGE 4X4 12PLY STRL (GAUZE/BANDAGES/DRESSINGS) ×9 IMPLANT
GAUZE XEROFORM 1X8 LF (GAUZE/BANDAGES/DRESSINGS) ×4 IMPLANT
GLOVE BIO SURGEON STRL SZ 6.5 (GLOVE) ×2 IMPLANT
GLOVE BIO SURGEON STRL SZ7.5 (GLOVE) ×7 IMPLANT
GLOVE BIO SURGEON STRL SZ8 (GLOVE) ×8 IMPLANT
GLOVE BIO SURGEON STRL SZ8.5 (GLOVE) ×4 IMPLANT
GLOVE BIOGEL PI IND STRL 6.5 (GLOVE) IMPLANT
GLOVE BIOGEL PI IND STRL 7.5 (GLOVE) ×3 IMPLANT
GLOVE BIOGEL PI IND STRL 8 (GLOVE) ×3 IMPLANT
GLOVE BIOGEL PI INDICATOR 6.5 (GLOVE) ×3
GLOVE BIOGEL PI INDICATOR 7.5 (GLOVE) ×1
GLOVE BIOGEL PI INDICATOR 8 (GLOVE) ×2
GOWN STRL REUS W/ TWL LRG LVL3 (GOWN DISPOSABLE) ×6 IMPLANT
GOWN STRL REUS W/ TWL XL LVL3 (GOWN DISPOSABLE) ×3 IMPLANT
GOWN STRL REUS W/TWL LRG LVL3 (GOWN DISPOSABLE) ×8
GOWN STRL REUS W/TWL XL LVL3 (GOWN DISPOSABLE) ×4
GUIDEROD T2 3X1000 (ROD) ×1 IMPLANT
GUIDEWIRE GAMMA (WIRE) ×1 IMPLANT
GUIDEWIRE GAMMA SM TIP 3X800MM (WIRE) ×1 IMPLANT
GUIDEWIRE THRD ASNIS 3.2X300 (WIRE) ×2 IMPLANT
KIT BASIN OR (CUSTOM PROCEDURE TRAY) ×4 IMPLANT
KIT INFUSE LRG II (Orthopedic Implant) ×1 IMPLANT
KIT ROOM TURNOVER OR (KITS) ×4 IMPLANT
MANIFOLD NEPTUNE II (INSTRUMENTS) ×4 IMPLANT
NAIL FEMORAL A/R 10X360 HIP (Nail) ×1 IMPLANT
NAIL TIBIAL STANDARD (Nail) ×4 IMPLANT
NAIL TIBIAL STD (Nail) IMPLANT
NAIL TIBIAL STD 8X300MM (Nail) ×1 IMPLANT
NS IRRIG 1000ML POUR BTL (IV SOLUTION) ×8 IMPLANT
PACK TOTAL JOINT (CUSTOM PROCEDURE TRAY) ×4 IMPLANT
PACK UNIVERSAL I (CUSTOM PROCEDURE TRAY) ×4 IMPLANT
PAD ARMBOARD 7.5X6 YLW CONV (MISCELLANEOUS) ×8 IMPLANT
PAD NEG PRESSURE SENSATRAC (MISCELLANEOUS) ×1 IMPLANT
PLATE SYMPHYSIS 92.5M 6H (Plate) ×1 IMPLANT
PLATE TUBUAL 1/3 6H (Plate) ×1 IMPLANT
REAMER SHAFT BIXCUT (INSTRUMENTS) ×1 IMPLANT
SCREW ASNIS 6.5X110MM (Screw) ×1 IMPLANT
SCREW ASNIS 6.5X115MM (Screw) ×1 IMPLANT
SCREW CANN ASNIS 6.5X110 STRL (Screw) IMPLANT
SCREW CANNULATED 8.0X145MM (Screw) ×1 IMPLANT
SCREW CANNULATED 8.0X170MM (Screw) ×1 IMPLANT
SCREW CORT 10X3.5XST NS LF (Screw) IMPLANT
SCREW CORT 48X3.5XST NS LF (Screw) IMPLANT
SCREW CORTEX ST MATTA 3.5X22MM (Screw) ×1 IMPLANT
SCREW CORTEX ST MATTA 3.5X24 (Screw) ×1 IMPLANT
SCREW CORTEX ST MATTA 3.5X60MM (Screw) ×1 IMPLANT
SCREW CORTEX ST MATTA 3.5X70MM (Screw) ×1 IMPLANT
SCREW CORTICAL 3.5 (Screw) ×20 IMPLANT
SCREW CORTICAL 3.5X42MM (Screw) ×1 IMPLANT
SCREW CORTICAL 3.5X48MM (Screw) ×4 IMPLANT
SCREW GAMMA (Screw) ×1 IMPLANT
SCREW LOCK T2 FT 5MMX85MM (Screw) ×1 IMPLANT
SCREW LOCKING FULL THREAD 4X30 (Screw) ×2 IMPLANT
SCREW LOCKING FULL THREAD 4X35 (Screw) ×1 IMPLANT
SCREW LOCKING T2 F/T  5MMX40MM (Screw) ×1 IMPLANT
SCREW LOCKING T2 F/T  5MMX50MM (Screw) ×1 IMPLANT
SCREW LOCKING T2 F/T  5MMX55MM (Screw) ×1 IMPLANT
SCREW LOCKING T2 F/T  5MMX65MM (Screw) ×2 IMPLANT
SCREW LOCKING T2 F/T  5X37.5MM (Screw) ×2 IMPLANT
SCREW LOCKING T2 F/T  5X42.5MM (Screw) ×1 IMPLANT
SCREW LOCKING T2 F/T 5MMX40MM (Screw) IMPLANT
SCREW LOCKING T2 F/T 5MMX50MM (Screw) IMPLANT
SCREW LOCKING T2 F/T 5MMX55MM (Screw) IMPLANT
SCREW LOCKING T2 F/T 5MMX65MM (Screw) IMPLANT
SCREW LOCKING T2 F/T 5X37.5MM (Screw) IMPLANT
SCREW LOCKING T2 F/T 5X42.5MM (Screw) IMPLANT
SPONGE GAUZE 4X4 12PLY STER LF (GAUZE/BANDAGES/DRESSINGS) ×2 IMPLANT
SPONGE LAP 18X18 X RAY DECT (DISPOSABLE) ×9 IMPLANT
STAPLER VISISTAT 35W (STAPLE) ×5 IMPLANT
SUCTION FRAZIER TIP 10 FR DISP (SUCTIONS) ×4 IMPLANT
SUT ETHILON 2 0 FS 18 (SUTURE) ×1 IMPLANT
SUT ETHILON 2 0 PSLX (SUTURE) ×6 IMPLANT
SUT ETHILON 3 0 PS 1 (SUTURE) ×5 IMPLANT
SUT ETHILON O TP 1 (SUTURE) ×1 IMPLANT
SUT PDS AB 0 CT 36 (SUTURE) ×1 IMPLANT
SUT PDS AB 2-0 CT1 27 (SUTURE) ×2 IMPLANT
SUT PROLENE 3 0 PS 2 (SUTURE) IMPLANT
SUT VIC AB 0 CT1 27 (SUTURE) ×12
SUT VIC AB 0 CT1 27XBRD ANBCTR (SUTURE) ×12 IMPLANT
SUT VIC AB 1 CT1 18XCR BRD 8 (SUTURE) ×6 IMPLANT
SUT VIC AB 1 CT1 8-18 (SUTURE) ×4
SUT VIC AB 1 CTX 18 (SUTURE) ×1 IMPLANT
SUT VIC AB 2-0 CT1 27 (SUTURE) ×16
SUT VIC AB 2-0 CT1 TAPERPNT 27 (SUTURE) ×12 IMPLANT
SUT VIC AB 2-0 CT3 27 (SUTURE) ×1 IMPLANT
TOWEL OR 17X24 6PK STRL BLUE (TOWEL DISPOSABLE) ×5 IMPLANT
TOWEL OR 17X26 10 PK STRL BLUE (TOWEL DISPOSABLE) ×8 IMPLANT
WATER STERILE IRR 1000ML POUR (IV SOLUTION) ×12 IMPLANT
YANKAUER SUCT BULB TIP NO VENT (SUCTIONS) ×5 IMPLANT
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
## 2015-01-15 NOTE — Progress Notes (Signed)
Pt taken to OR at 1245. Fentanyl drip taken down and discarded; 100 ml wasted. Witnessed by Armida Sans, RN

## 2015-01-15 NOTE — Progress Notes (Signed)
Orthopedic Tech Progress Note Patient Details:  Juan [name] 15-Jan-1991 [id number]  Ortho Devices Type of Ortho Device: Postop shoe/boot Ortho Device/Splint Location: (B) LE Ortho Device/Splint Interventions: Ordered, Application   [name] 01/15/2015, 9:50 PM
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
## 2015-01-15 NOTE — Progress Notes (Signed)
At 0904, I was called into the room to find patient's HR 144, diaphoretic, eyes rolled upward and having BLE tremors/shivers.  Ativan given, MD paged. RT also at the bedside to witness. 15 minutes later pt's condition hadn't changed; another  Ativan given. A few minutes later, MD at the bedside but pt's HR had decreased to lower 100 and he wasn't shivering anymore. In total this occurrence lasted approximately 15-19 minutes.

## 2015-01-15 NOTE — Progress Notes (Signed)
Fentanyl drip returned to [place] in Pharmacy /received by mistake pt was in OR at time of delivery

## 2015-01-15 NOTE — Progress Notes (Signed)
Patient in OR at the time to complete last round vent checks.  Will check when patient returns from OR.

## 2015-01-15 NOTE — Progress Notes (Signed)
Upon return from OR pt began having seizure like activity  Involving RLE and Bilateral  UE  With decorticate posturing.  Given Ativan 2 mg IV nad Versed 2 mg IV with effect.  At 2300 Vital signs: 212/168  St 150 RR 34.  Fentanyl and Precedex gtt restarted  per order  and were effective  In normalizing VS.  126/56   126  25  100% sats on  Vent 40%FIO2.  Updated family in waiting room.

## 2015-01-15 NOTE — Anesthesia Postprocedure Evaluation (Signed)
  Anesthesia Post-op Note  Patient: Juan [name]  Procedure(s) Performed: Procedure(s): orif pelvis bilateral iliac screws percantaneous fixation left tavern, im nail bilateral tibia, retrograde im nail left femur, removal of external fixation  (Bilateral) INTRAMEDULLARY (IM) NAIL TIBIAL (Bilateral) INTRAMEDULLARY (IM) RETROGRADE FEMORAL NAILING (Left) IRRIGATION AND DEBRIDEMENT BILATERAL EXTREMITY (Bilateral)  Patient Location: PACU and SICU  Anesthesia Type:General  Level of Consciousness: sedated  Airway and Oxygen Therapy: Patient remains intubated per anesthesia plan  Post-op Pain: none  Post-op Assessment: Post-op Vital signs reviewed LLE Motor Response: No movement to painful stimulus   RLE Motor Response: No movement to painful stimulus        Post-op Vital Signs: Reviewed  Last Vitals:  Filed Vitals:   01/15/15 1200  BP: 118/58  Pulse: 91  Temp:   Resp: 20    Complications: No apparent anesthesia complications

## 2015-01-15 NOTE — Progress Notes (Signed)
Patient is still in OR was unable to do 8:00 vent check. RT will do vent check upon arrival of the patient to the unit.

## 2015-01-15 NOTE — Progress Notes (Signed)
EEG completed; results pending.  Juan [name], R.EEGT.  

## 2015-01-15 NOTE — Anesthesia Preprocedure Evaluation (Signed)
Anesthesia Evaluation  Patient identified by MRN, date of birth, ID band Patient unresponsive    Reviewed: Allergy & Precautions, NPO status , Patient's Chart, lab work & pertinent test resultsPreop documentation limited or incomplete due to emergent nature of procedure.  Airway Mallampati: Intubated       Dental   Pulmonary  Bilateral pulmonary contusions with multiple rib fxs right side and respiratory failure requiring intubation and mechanical ventilatory support. breath sounds clear to auscultation        Cardiovascular negative cardio ROS  Rhythm:Regular Rate:Tachycardia     Neuro/Psych  Headaches, Seizures -,  Encephalopathy 2/2 fat emboli syndrome with seizures. negative psych ROS   GI/Hepatic negative GI ROS, Elevated LFT's    Endo/Other  negative endocrine ROS  Renal/GU negative Renal ROS  negative genitourinary   Musculoskeletal negative musculoskeletal ROS (+)   Abdominal   Peds negative pediatric ROS (+)  Hematology  (+) anemia , DVT s/p IVC filter.   Anesthesia Other Findings Chest Tube on Right  Reproductive/Obstetrics negative OB ROS                             Lab Results  Component Value Date   WBC 13.2* 01/15/2015   HGB 7.9* 01/15/2015   HCT 23.9* 01/15/2015   MCV 92.3 01/15/2015   PLT 401* 01/15/2015   Lab Results  Component Value Date   CREATININE 0.74 01/15/2015   BUN 20 01/15/2015   NA 136 01/15/2015   K 4.8 01/15/2015   CL 104 01/15/2015   CO2 26 01/15/2015   Lab Results  Component Value Date   INR 1.25 01/09/2015   INR 1.23 01/08/2015   INR 1.34 01/08/2015    Anesthesia Physical  Anesthesia Plan  ASA: IV  Anesthesia Plan: General   Post-op Pain Management:    Induction: Intravenous  Airway Management Planned: Oral ETT  Additional Equipment:   Intra-op Plan:   Post-operative Plan: Post-operative intubation/ventilation  Informed  Consent: I have reviewed the patients History and Physical, chart, labs and discussed the procedure including the risks, benefits and alternatives for the proposed anesthesia with the patient or authorized representative who has indicated his/her understanding and acceptance.     Plan Discussed with: CRNA  Anesthesia Plan Comments:         Anesthesia Quick Evaluation

## 2015-01-15 NOTE — Progress Notes (Signed)
Patient ID: Juan [name], male   DOB: Oct 19, 1990, 24 y.o.   MRN: [id number] Notified by RN of episode of twitching and elevated HR. He received Ativan and on my arrival this has resolved with no abnormal movement and HR 103. I D/W Dr. Leroy Kennedy and he will see him and check his Vimpat and Keppra dosages and he should still be OK to go to the OR today. Violeta Gelinas, MD, MPH, FACS Trauma: [phone] General Surgery: [phone]

## 2015-01-15 NOTE — Progress Notes (Signed)
Patient had a typical event during prolonged EEG this morning and there was not concomitant EEG changes. Continue current AED. From a neuro standpoint ok to proceed with surgery.  Wyatt Portela, MD [facility]

## 2015-01-15 NOTE — Transfer of Care (Signed)
Immediate Anesthesia Transfer of Care Note  Patient: Juan [name]  Procedure(s) Performed: Procedure(s): orif pelvis bilateral iliac screws percantaneous fixation left tavern, im nail bilateral tibia, retrograde im nail left femur, removal of external fixation  (Bilateral) INTRAMEDULLARY (IM) NAIL TIBIAL (Bilateral) INTRAMEDULLARY (IM) RETROGRADE FEMORAL NAILING (Left) IRRIGATION AND DEBRIDEMENT BILATERAL EXTREMITY (Bilateral)  Patient Location: SICU  Anesthesia Type:General  Level of Consciousness: Patient remains intubated per anesthesia plan  Airway & Oxygen Therapy: Patient remains intubated per anesthesia plan and Patient placed on Ventilator (see vital sign flow sheet for setting)  Post-op Assessment: Report given to RN and Post -op Vital signs reviewed and stable  Post vital signs: Reviewed and stable  Last Vitals:  Filed Vitals:   01/15/15 1200  BP: 118/58  Pulse: 91  Temp:   Resp: 20    Complications: No apparent anesthesia complications
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
## 2015-01-16 ENCOUNTER — Encounter (HOSPITAL_COMMUNITY): Payer: Self-pay | Admitting: Orthopedic Surgery

## 2015-01-16 ENCOUNTER — Inpatient Hospital Stay (HOSPITAL_COMMUNITY): Payer: 59

## 2015-01-16 DIAGNOSIS — T791XXA Fat embolism (traumatic), initial encounter: Secondary | ICD-10-CM | POA: Diagnosis not present

## 2015-01-16 DIAGNOSIS — S36899A Unspecified injury of other intra-abdominal organs, initial encounter: Secondary | ICD-10-CM | POA: Diagnosis present

## 2015-01-16 LAB — TYPE AND SCREEN
ABO/RH(D): A POS
Antibody Screen: NEGATIVE
Unit division: 0
Unit division: 0
Unit division: 0
Unit division: 0

## 2015-01-16 LAB — CBC
HCT: 29.1 % — ABNORMAL LOW (ref 39.0–52.0)
Hemoglobin: 9.8 g/dL — ABNORMAL LOW (ref 13.0–17.0)
MCH: 29.5 pg (ref 26.0–34.0)
MCHC: 33.7 g/dL (ref 30.0–36.0)
MCV: 87.7 fL (ref 78.0–100.0)
Platelets: 303 10*3/uL (ref 150–400)
RBC: 3.32 MIL/uL — ABNORMAL LOW (ref 4.22–5.81)
RDW: 15.1 % (ref 11.5–15.5)
WBC: 13.1 10*3/uL — ABNORMAL HIGH (ref 4.0–10.5)

## 2015-01-16 LAB — GLUCOSE, CAPILLARY
Glucose-Capillary: 112 mg/dL — ABNORMAL HIGH (ref 65–99)
Glucose-Capillary: 115 mg/dL — ABNORMAL HIGH (ref 65–99)
Glucose-Capillary: 120 mg/dL — ABNORMAL HIGH (ref 65–99)
Glucose-Capillary: 137 mg/dL — ABNORMAL HIGH (ref 65–99)
Glucose-Capillary: 142 mg/dL — ABNORMAL HIGH (ref 65–99)
Glucose-Capillary: 146 mg/dL — ABNORMAL HIGH (ref 65–99)
Glucose-Capillary: 148 mg/dL — ABNORMAL HIGH (ref 65–99)

## 2015-01-16 LAB — BASIC METABOLIC PANEL
Anion gap: 3 — ABNORMAL LOW (ref 5–15)
BUN: 19 mg/dL (ref 6–20)
CO2: 28 mmol/L (ref 22–32)
Calcium: 7.8 mg/dL — ABNORMAL LOW (ref 8.9–10.3)
Chloride: 104 mmol/L (ref 101–111)
Creatinine, Ser: 0.67 mg/dL (ref 0.61–1.24)
GFR calc Af Amer: >60 mL/min (ref >60)
GFR calc non Af Amer: >60 mL/min (ref >60)
Glucose, Bld: 149 mg/dL — ABNORMAL HIGH (ref 65–99)
Potassium: 4.4 mmol/L (ref 3.5–5.1)
Sodium: 135 mmol/L (ref 135–145)

## 2015-01-16 MED ORDER — SODIUM CHLORIDE 0.9 % IV SOLN
200.0000 mg | Freq: Two times a day (BID) | INTRAVENOUS | Status: DC
  Administered 2015-01-16 – 2015-02-12 (×55): 200 mg via INTRAVENOUS
  Filled 2015-01-16 (×105): qty 20

## 2015-01-16 MED ORDER — DEXMEDETOMIDINE HCL IN NACL 400 MCG/100ML IV SOLN
0.4000 ug/kg/h | INTRAVENOUS | Status: DC
  Administered 2015-01-16 – 2015-01-17 (×7): 0.7 ug/kg/h via INTRAVENOUS
  Administered 2015-01-18: 1.2 ug/kg/h via INTRAVENOUS
  Administered 2015-01-18: 1 ug/kg/h via INTRAVENOUS
  Administered 2015-01-18: 1.1 ug/kg/h via INTRAVENOUS
  Administered 2015-01-18 (×2): 0.7 ug/kg/h via INTRAVENOUS
  Administered 2015-01-19 (×3): 1 ug/kg/h via INTRAVENOUS
  Administered 2015-01-19: 1.2 ug/kg/h via INTRAVENOUS
  Administered 2015-01-19 – 2015-01-20 (×2): 1 ug/kg/h via INTRAVENOUS
  Administered 2015-01-20 (×2): 1.2 ug/kg/h via INTRAVENOUS
  Administered 2015-01-20: 1 ug/kg/h via INTRAVENOUS
  Administered 2015-01-20: 1.2 ug/kg/h via INTRAVENOUS
  Administered 2015-01-20: 1 ug/kg/h via INTRAVENOUS
  Administered 2015-01-20 – 2015-01-21 (×8): 1.2 ug/kg/h via INTRAVENOUS
  Administered 2015-01-22: 1.1 ug/kg/h via INTRAVENOUS
  Administered 2015-01-22 (×2): 0.8 ug/kg/h via INTRAVENOUS
  Administered 2015-01-22 – 2015-01-23 (×7): 1.2 ug/kg/h via INTRAVENOUS
  Administered 2015-01-24: 0.8 ug/kg/h via INTRAVENOUS
  Administered 2015-01-24 (×4): 1.2 ug/kg/h via INTRAVENOUS
  Administered 2015-01-25 (×3): 1 ug/kg/h via INTRAVENOUS
  Administered 2015-01-25: 0.4 ug/kg/h via INTRAVENOUS
  Administered 2015-01-26: 0.8 ug/kg/h via INTRAVENOUS
  Administered 2015-01-26: 0.7 ug/kg/h via INTRAVENOUS
  Administered 2015-01-26: 1 ug/kg/h via INTRAVENOUS
  Administered 2015-01-26: 1.2 ug/kg/h via INTRAVENOUS
  Administered 2015-01-27 (×2): 0.4 ug/kg/h via INTRAVENOUS
  Administered 2015-01-27: 0.3 ug/kg/h via INTRAVENOUS
  Filled 2015-01-16 (×63): qty 100

## 2015-01-16 MED ORDER — ACETAMINOPHEN 160 MG/5ML PO SOLN
650.0000 mg | Freq: Four times a day (QID) | ORAL | Status: DC | PRN
  Administered 2015-01-17 – 2015-02-08 (×11): 650 mg
  Filled 2015-01-16 (×11): qty 20.3

## 2015-01-16 MED ORDER — INFLUENZA VAC SPLIT QUAD 0.5 ML IM SUSY
0.5000 mL | PREFILLED_SYRINGE | INTRAMUSCULAR | Status: AC
  Administered 2015-01-19: 0.5 mL via INTRAMUSCULAR
  Filled 2015-01-16: qty 0.5
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
## 2015-01-16 NOTE — Progress Notes (Signed)
Patient ID: Juan [name], male   DOB: 09/19/1990, 23 y.o.   MRN: [id number] I met with his parents, step dad and girlfriend. I went over his current clinical situation and our plan of care. I also discussed our plan for trach/PEG later this week and I discussed the procedures, risks, and benefits. I answered their questions.  , MD, MPH, FACS Trauma: [phone] General Surgery: [phone]  
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
## 2015-01-16 NOTE — Progress Notes (Signed)
NEURO HOSPITALIST PROGRESS NOTE   SUBJECTIVE:                                                                                                                        Neurologically unchanged. On precedex and fentanyl. Family at the bedside. Prolonged EEG yesterday showed diffuse slowing but no evidence of electrographic seizures. He had a typical event captured on EEG and there was not concomitant EEG changes. On iv keppra 1.5 gr BID and Vimpat 150 mg BID. Serologies significant for wbc >13,000  OBJECTIVE:                                                                                                                           Vital signs in last 24 hours: Temp:  [97.8 F (36.6 C)-100.4 F (38 C)] 100.4 F (38 C) (08/30 0700) Pulse Rate:  [84-145] 102 (08/30 0700) Resp:  [14-30] 20 (08/30 0700) BP: (107-212)/(52-158) 107/53 mmHg (08/30 0700) SpO2:  [98 %-100 %] 98 % (08/30 0700) FiO2 (%):  [40 %] 40 % (08/30 0600) Weight:  [86.3 kg (190 lb 4.1 oz)] 86.3 kg (190 lb 4.1 oz) (08/30 0530)  Intake/Output from previous day: 08/29 0701 - 08/30 0700 In: 6958 [I.V.:4840; Blood:1308; NG/GT:580; IV Piggyback:230] Out: 4470 [Urine:3600; Emesis/NG output:100; Drains:50; Blood:600; Chest Tube:120] Intake/Output this shift: Total I/O In: -  Out: 300 [Urine:300] Nutritional status: Diet NPO time specified  Past Medical History  Diagnosis Date  . Open fracture of right tibia 01/06/2015  . Open fracture of shaft of left tibia, type III 01/06/2015  . Closed left subtrochanteric femur fracture 01/06/2015   Physical exam:  Constitutional: well developed, sedated on the vent, no apparent distress. Eyes: no jaundice or exophthalmos.  Head: normocephalic. Neck: supple, no bruits, no JVD. Cardiac: no murmurs. Lungs: clear. Abdomen: soft, no tender, no mass. Extremities: no edema, clubbing, or cyanosis.  Skin: no rash  Neurologic Exam:  Patient is heavily  sedated with precedex and Fentanyl. Mental status: open eyes spontaneously but does not follow commands. CN 2-12: pupils 2 mm bilaterally, reactive. No gaze preference. No nystagmus. Motor: no spontaneous or pain induced movements but heavily sedated. Sensory: sedated. DTR's: unable to elicit. Coordination and gait: unable to test.  Lab Results: No results found for: CHOL Lipid Panel No results for input(s): CHOL, TRIG, HDL, CHOLHDL, VLDL, LDLCALC in the last 72 hours.  Studies/Results: Dg Tibia/fibula Left  01/15/2015   CLINICAL DATA:  Internal fixation of left tibial fracture. Initial encounter.  EXAM: LEFT TIBIA AND FIBULA - 2 VIEW  COMPARISON:  Left tibia/fibula radiographs performed 01/06/2015  FINDINGS: Nine fluoroscopic C-arm images are provided from the OR. These demonstrate placement of an intramedullary rod and screws, as well as a plate, across the patient's comminuted tibial fracture, transfixing it in grossly anatomic alignment. There is approximately 1 shaft width persistent medial and posterior displacement of the patient's mid fibular fracture.  An intramedullary rod is also noted within the left femur. The ankle mortise is grossly unremarkable in appearance.  IMPRESSION: Interval internal fixation of comminuted tibial fracture in grossly anatomic alignment. Approximately 1 shaft width persistent medial and posterior displacement of the mid fibular fracture.   Electronically Signed   By: Roanna Raider M.D.   On: 01/15/2015 22:31   Dg Tibia/fibula Right  01/15/2015   CLINICAL DATA:  Internal fixation of right tibial fracture. Initial encounter.  EXAM: RIGHT TIBIA AND FIBULA - 2 VIEW  COMPARISON:  Right tibia/fibula radiographs performed 01/06/2015  FINDINGS: Six fluoroscopic C-arm images are provided from the OR, demonstrating interval internal fixation of the patient's comminuted tibial fracture in grossly anatomic alignment. An associated butterfly fragment is seen. There is  approximately 2/3 shaft width lateral and posterior displacement of the mid fibular fracture.  IMPRESSION: Interval internal fixation of comminuted tibial fracture in grossly anatomic alignment. Associated butterfly fragment noted. Approximately 2/3 shaft width lateral and posterior displacement of the mid fibular fracture.   Electronically Signed   By: Roanna Raider M.D.   On: 01/15/2015 22:34   Dg Pelvis Comp Min 3v  01/16/2015   CLINICAL DATA:  Postoperative.  Multiple fractures.  EXAM: JUDET PELVIS - 3+ VIEW  COMPARISON:  01/08/2015  FINDINGS: There is a single fixation screw across the sacroiliac joints. There is a plate screw pubic symphysis fixation. There are 2 left acetabular fixation screws. Comminuted inferior right pubic ramus fracture again noted with mild displacement.  IMPRESSION: ORIF of the SI joint, left acetabulum and pubic symphysis.   Electronically Signed   By: Ellery Plunk M.D.   On: 01/16/2015 01:32   Dg Pelvis Comp Min 3v  01/15/2015   CLINICAL DATA:  Intraoperative fixation, pelvic fracture fixation  EXAM: JUDET PELVIS - 3+ VIEW  COMPARISON:  CT 01/09/2015  FINDINGS: Ten intraprocedural fluoroscopic images are provided, demonstrating hardware fixation at the symphysis pubis as well as hardware traversing the bony pelvis. Fracture fragments are in near anatomic alignment.  IMPRESSION: Intra procedural imaging as above.   Electronically Signed   By: Christiana Pellant M.D.   On: 01/15/2015 22:26   Dg Chest Port 1 View  01/15/2015   CLINICAL DATA:  Respiratory failure  EXAM: PORTABLE CHEST - 1 VIEW  COMPARISON:  Same date, 6:45 a.m.  FINDINGS: Endotracheal and nasogastric tubes are appropriately positioned. Right subclavian approach central line tip terminates over the cavoatrial junction. Right-sided chest tube in place. Hazy opacity over the left lung likely indicates posteriorly tracking pleural effusion. No new focal pulmonary opacity. No pneumothorax. No acute osseous finding.   IMPRESSION: No significant interval change in presumed trace posteriorly tracking left pleural effusion. Support apparatus as above.   Electronically Signed   By: Christiana Pellant M.D.   On: 01/15/2015 23:40  Dg Chest Port 1 View  01/15/2015   CLINICAL DATA:  Acute respiratory failure. On ventilator. Traumatic pneumothorax and lower extremity fractures.  EXAM: PORTABLE CHEST - 1 VIEW  COMPARISON:  01/14/2015  FINDINGS: Support lines and tubes in appropriate position. No pneumothorax visualized. Bilateral pulmonary airspace disease shows slight improvement in both lung bases.  IMPRESSION: Bilateral airspace disease with mild improvement in both lung bases. No pneumothorax visualized.   Electronically Signed   By: Myles Rosenthal M.D.   On: 01/15/2015 07:47   Dg Tibia/fibula Left Port  01/16/2015   CLINICAL DATA:  Postoperative radiograph, status post internal fixation of left tibial fracture. Initial encounter.  EXAM: PORTABLE LEFT TIBIA AND FIBULA - 2 VIEW  COMPARISON:  Left tibia/fibula radiographs performed earlier today at 7:07 p.m.  FINDINGS: The patient is status post internal fixation of the tibial fracture in grossly anatomic alignment, with associated drain. There is nearly 1 shaft width medial and anterior displacement of the mid fibular fracture. Surrounding soft tissue swelling is again noted. No new fractures are seen.  IMPRESSION: Status post internal fixation of tibial fracture in grossly anatomic alignment. Nearly 1 shaft width medial and anterior displacement of the mid fibular fracture.   Electronically Signed   By: Roanna Raider M.D.   On: 01/16/2015 01:34   Dg Tibia/fibula Right Port  01/16/2015   CLINICAL DATA:  Postoperative radiograph, status post internal fixation of tibial fracture. Initial encounter.  EXAM: PORTABLE RIGHT TIBIA AND FIBULA - 2 VIEW  COMPARISON:  Right tibia/fibula radiographs performed earlier today at 8:35 p.m.  FINDINGS: The patient is status post internal fixation  of the tibial fracture in grossly anatomic alignment, with a minimally displaced butterfly fragment. There is approximately 2/3 shaft width residual posterior displacement of the distal fibula at the level of the mid fibular fracture. Mild surrounding soft tissue swelling is noted. The knee joint is grossly unremarkable in appearance. No knee joint effusion is characterized.  IMPRESSION: Status post internal fixation of tibial fracture in grossly anatomic alignment, with a minimally displaced butterfly fragment seen. 2/3 shaft width residual posterior displacement of the distal fibula at the level of the mid fibular fracture.   Electronically Signed   By: Roanna Raider M.D.   On: 01/16/2015 01:32   Dg C-arm Gt 120 Min  01/15/2015   CLINICAL DATA:  Intraoperative imaging, left femur fixation  EXAM: DG C-ARM GT 120 MIN; LEFT FEMUR 2 VIEWS  COMPARISON:  Left lower extremity imaging same date.  FINDINGS: 2 intra procedural fluoroscopic images demonstrate partial visualization of intra medullary rod fixation of comminuted mid left femoral diaphyseal fractures. Fracture fragments are in near anatomic alignment. Surgical staples are noted.  IMPRESSION: Intraoperative imaging as above.   Electronically Signed   By: Christiana Pellant M.D.   On: 01/15/2015 22:28   Dg Femur Min 2 Views Left  01/15/2015   CLINICAL DATA:  Intraoperative imaging, left femur fixation  EXAM: DG C-ARM GT 120 MIN; LEFT FEMUR 2 VIEWS  COMPARISON:  Left lower extremity imaging same date.  FINDINGS: 2 intra procedural fluoroscopic images demonstrate partial visualization of intra medullary rod fixation of comminuted mid left femoral diaphyseal fractures. Fracture fragments are in near anatomic alignment. Surgical staples are noted.  IMPRESSION: Intraoperative imaging as above.   Electronically Signed   By: Christiana Pellant M.D.   On: 01/15/2015 22:28   Dg Femur Port Min 2 Views Left  01/16/2015   CLINICAL DATA:  Postoperative.  Multiple  fractures.  EXAM: LEFT FEMUR PORTABLE 2 VIEWS  COMPARISON:  01/06/2015  FINDINGS: Three portable views demonstrate an intramedullary left femoral nail with proximal and distal interlocking screws. Comminuted fracture fragments are present in the proximal diaphysis. Anatomic alignment of the major fracture fragments.  IMPRESSION: Left femoral ORIF with intramedullary nail.   Electronically Signed   By: Ellery Plunk M.D.   On: 01/16/2015 01:35    MEDICATIONS                                                                                                                        Scheduled: . antiseptic oral rinse  7 mL Mouth Rinse QID  .  ceFAZolin (ANCEF) IV  2 g Intravenous 3 times per day  . chlorhexidine gluconate  15 mL Mouth Rinse BID  . feeding supplement (PIVOT 1.5 CAL)  1,000 mL Per Tube Q24H  . ipratropium-albuterol  3 mL Nebulization Q6H  . lacosamide (VIMPAT) IV  200 mg Intravenous Q12H  . levETIRAcetam  1,500 mg Intravenous Q12H  . pantoprazole  40 mg Oral Daily   Or  . pantoprazole (PROTONIX) IV  40 mg Intravenous Daily    ASSESSMENT/PLAN:                                                                                                           24 yo M with coma and seizures due to fat emboli syndrome. He has had significant injury to the brain on MRI. Event captured on prolonged EEG yesterday did not show concomitant EEG changes. Recommend: 1) Increase daily dose vimpat to 200 mg BID starting tomorrow. 2) Prolonged EEG. 3) Continue keppra 1.5 gr BID  4) CT brain Will follow up.  Juan Portela, MD [facility] [phone]  01/16/2015, 8:53 AM

## 2015-01-17 ENCOUNTER — Inpatient Hospital Stay (HOSPITAL_COMMUNITY): Payer: 59

## 2015-01-17 LAB — GLUCOSE, CAPILLARY
Glucose-Capillary: 113 mg/dL — ABNORMAL HIGH (ref 65–99)
Glucose-Capillary: 134 mg/dL — ABNORMAL HIGH (ref 65–99)
Glucose-Capillary: 107 mg/dL — ABNORMAL HIGH (ref 65–99)
Glucose-Capillary: 103 mg/dL — ABNORMAL HIGH (ref 65–99)

## 2015-01-17 LAB — CBC
HCT: 26.9 % — ABNORMAL LOW (ref 39.0–52.0)
Hemoglobin: 8.9 g/dL — ABNORMAL LOW (ref 13.0–17.0)
MCH: 29.8 pg (ref 26.0–34.0)
MCHC: 33.1 g/dL (ref 30.0–36.0)
MCV: 90 fL (ref 78.0–100.0)
Platelets: 349 10*3/uL (ref 150–400)
RBC: 2.99 MIL/uL — ABNORMAL LOW (ref 4.22–5.81)
RDW: 15.2 % (ref 11.5–15.5)
WBC: 11.6 10*3/uL — ABNORMAL HIGH (ref 4.0–10.5)

## 2015-01-17 LAB — BASIC METABOLIC PANEL
Anion gap: 3 — ABNORMAL LOW (ref 5–15)
BUN: 18 mg/dL (ref 6–20)
CO2: 29 mmol/L (ref 22–32)
Calcium: 7.7 mg/dL — ABNORMAL LOW (ref 8.9–10.3)
Chloride: 102 mmol/L (ref 101–111)
Creatinine, Ser: 0.64 mg/dL (ref 0.61–1.24)
GFR calc Af Amer: >60 mL/min (ref >60)
GFR calc non Af Amer: >60 mL/min (ref >60)
Glucose, Bld: 125 mg/dL — ABNORMAL HIGH (ref 65–99)
Potassium: 4.1 mmol/L (ref 3.5–5.1)
Sodium: 134 mmol/L — ABNORMAL LOW (ref 135–145)

## 2015-01-17 LAB — URINE CULTURE: Culture: NO GROWTH

## 2015-01-17 MED ORDER — KCL IN DEXTROSE-NACL 20-5-0.9 MEQ/L-%-% IV SOLN
INTRAVENOUS | Status: DC
  Administered 2015-01-17 – 2015-01-27 (×10): via INTRAVENOUS
  Filled 2015-01-17 (×14): qty 1000

## 2015-01-17 MED ORDER — POLYETHYLENE GLYCOL 3350 17 G PO PACK
17.0000 g | PACK | Freq: Every day | ORAL | Status: DC
  Administered 2015-01-17 – 2015-02-13 (×19): 17 g
  Filled 2015-01-17 (×31): qty 1

## 2015-01-17 MED ORDER — ENOXAPARIN SODIUM 30 MG/0.3ML ~~LOC~~ SOLN
30.0000 mg | Freq: Two times a day (BID) | SUBCUTANEOUS | Status: DC
Start: 2015-01-17 — End: 2015-02-13
  Administered 2015-01-17 – 2015-02-13 (×53): 30 mg via SUBCUTANEOUS
  Filled 2015-01-17 (×69): qty 0.3
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
## 2015-01-17 NOTE — Progress Notes (Signed)
Chest tube to water seal per order.
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
## 2015-01-17 NOTE — Progress Notes (Signed)
CRITICAL VALUE ALERT  Critical value received:  Gram + cocci in clusters blood culture( aerobic bottle only)  Date of notification:  8/31  Time of notification:  1930  Critical value read back:Yes.    Nurse who received alert:  Chrissie Noa Mychal Decarlo via Deberah Pelton  Time of first page:  2200  Responding MD:  Dr Corliss Skains

## 2015-01-17 NOTE — Progress Notes (Signed)
Consent obtained from patient's mother. Consent signed and placed in chart.

## 2015-01-17 NOTE — Progress Notes (Signed)
Orthopedic Tech Progress Note Patient Details:  Juan [name] 19-Oct-1990 [id number] Delivered 3 rolls of 6" elastic bandage to pt.'s nurse. Patient ID: Juan [name], male   DOB: 22-Jun-1990, 24 y.o.   MRN: [id number]   Juan [name] 01/17/2015, 2:16 PM

## 2015-01-18 ENCOUNTER — Encounter (HOSPITAL_COMMUNITY): Admission: EM | Disposition: A | Discharge status: Rehabilitation Facility | Payer: Self-pay | Source: Home / Self Care

## 2015-01-18 ENCOUNTER — Inpatient Hospital Stay (HOSPITAL_COMMUNITY): Payer: 59

## 2015-01-18 ENCOUNTER — Encounter (HOSPITAL_COMMUNITY): Payer: Self-pay | Admitting: Orthopedic Surgery

## 2015-01-18 HISTORY — PX: PERCUTANEOUS TRACHEOSTOMY: SHX5288

## 2015-01-18 HISTORY — PX: ESOPHAGOGASTRODUODENOSCOPY (EGD) WITH PROPOFOL: SHX5813

## 2015-01-18 HISTORY — PX: PEG PLACEMENT: SHX5437

## 2015-01-18 LAB — CBC
HCT: 25.5 % — ABNORMAL LOW (ref 39.0–52.0)
Hemoglobin: 8.3 g/dL — ABNORMAL LOW (ref 13.0–17.0)
MCH: 29.4 pg (ref 26.0–34.0)
MCHC: 32.5 g/dL (ref 30.0–36.0)
MCV: 90.4 fL (ref 78.0–100.0)
Platelets: 360 10*3/uL (ref 150–400)
RBC: 2.82 MIL/uL — ABNORMAL LOW (ref 4.22–5.81)
RDW: 14.4 % (ref 11.5–15.5)
WBC: 7.7 10*3/uL (ref 4.0–10.5)

## 2015-01-18 LAB — GLUCOSE, CAPILLARY
Glucose-Capillary: 103 mg/dL — ABNORMAL HIGH (ref 65–99)
Glucose-Capillary: 104 mg/dL — ABNORMAL HIGH (ref 65–99)
Glucose-Capillary: 105 mg/dL — ABNORMAL HIGH (ref 65–99)
Glucose-Capillary: 109 mg/dL — ABNORMAL HIGH (ref 65–99)
Glucose-Capillary: 111 mg/dL — ABNORMAL HIGH (ref 65–99)
Glucose-Capillary: 112 mg/dL — ABNORMAL HIGH (ref 65–99)
Glucose-Capillary: 97 mg/dL (ref 65–99)

## 2015-01-18 SURGERY — INSERTION, PEG TUBE

## 2015-01-18 SURGERY — CREATION, TRACHEOSTOMY, PERCUTANEOUS
Anesthesia: Moderate Sedation | Site: Neck

## 2015-01-18 MED ORDER — VECURONIUM BROMIDE 10 MG IV SOLR
INTRAVENOUS | Status: AC
  Filled 2015-01-18: qty 10

## 2015-01-18 MED ORDER — VANCOMYCIN HCL IN DEXTROSE 1-5 GM/200ML-% IV SOLN
1000.0000 mg | Freq: Three times a day (TID) | INTRAVENOUS | Status: DC
  Administered 2015-01-18 – 2015-01-19 (×4): 1000 mg via INTRAVENOUS
  Filled 2015-01-18 (×7): qty 200

## 2015-01-18 MED ORDER — FENTANYL BOLUS VIA INFUSION: 100.0000 ug | Freq: Once | INTRAVENOUS | Status: DC

## 2015-01-18 MED ORDER — MIDAZOLAM HCL 2 MG/2ML IJ SOLN
INTRAMUSCULAR | Status: AC
  Administered 2015-01-18: 2 mg
  Filled 2015-01-18: qty 6

## 2015-01-18 MED ORDER — MIDAZOLAM HCL 2 MG/2ML IJ SOLN
2.0000 mg | Freq: Once | INTRAMUSCULAR | Status: AC
  Administered 2015-01-18: 2 mg via INTRAVENOUS

## 2015-01-18 MED ORDER — LIDOCAINE-EPINEPHRINE 1.5 %-1:200,000 OPTIME - NO CHARGE
INTRAMUSCULAR | Status: DC | PRN
  Administered 2015-01-18: 4 mL via SUBCUTANEOUS

## 2015-01-18 MED ORDER — VECURONIUM BROMIDE 10 MG IV SOLR
10.0000 mg | Freq: Once | INTRAVENOUS | Status: AC
Start: 2015-01-18 — End: 2015-01-18
  Administered 2015-01-18: 10 mg via INTRAVENOUS
  Filled 2015-01-18: qty 10

## 2015-01-18 MED ORDER — VECURONIUM BROMIDE 10 MG IV SOLR
10.0000 mg | Freq: Once | INTRAVENOUS | Status: AC
  Administered 2015-01-18: 10 mg via INTRAVENOUS
  Filled 2015-01-18: qty 10

## 2015-01-18 MED ORDER — SODIUM CHLORIDE 0.9 % IJ SOLN
INTRAMUSCULAR | Status: DC | PRN
  Administered 2015-01-18 (×2): 10 mL via INTRAVENOUS

## 2015-01-18 MED ORDER — FENTANYL BOLUS VIA INFUSION: 50.0000 ug | Freq: Once | INTRAVENOUS | Status: DC

## 2015-01-18 SURGICAL SUPPLY — 27 items
DRAPE PROXIMA HALF (DRAPES) ×4 IMPLANT
DRAPE UTILITY XL STRL (DRAPES) ×3 IMPLANT
ELECT CAUTERY BLADE 6.4 (BLADE) ×2 IMPLANT
ELECT REM PT RETURN 9FT ADLT (ELECTROSURGICAL) ×2
ELECTRODE REM PT RTRN 9FT ADLT (ELECTROSURGICAL) ×1 IMPLANT
GAUZE SPONGE 4X4 16PLY XRAY LF (GAUZE/BANDAGES/DRESSINGS) ×2 IMPLANT
GLOVE BIO SURGEON STRL SZ 6.5 (GLOVE) ×1 IMPLANT
GLOVE BIO SURGEON STRL SZ7.5 (GLOVE) ×2 IMPLANT
GLOVE BIO SURGEON STRL SZ8 (GLOVE) ×4 IMPLANT
GLOVE BIOGEL PI IND STRL 7.5 (GLOVE) ×1 IMPLANT
GLOVE BIOGEL PI IND STRL 8 (GLOVE) ×2 IMPLANT
GLOVE BIOGEL PI INDICATOR 7.5 (GLOVE) ×1
GLOVE BIOGEL PI INDICATOR 8 (GLOVE) ×2
GOWN STRL REUS W/ TWL LRG LVL3 (GOWN DISPOSABLE) ×1 IMPLANT
GOWN STRL REUS W/ TWL XL LVL3 (GOWN DISPOSABLE) ×2 IMPLANT
GOWN STRL REUS W/TWL LRG LVL3 (GOWN DISPOSABLE) ×2
GOWN STRL REUS W/TWL XL LVL3 (GOWN DISPOSABLE) ×4
INTRODUCER TRACH BLUE RHINO 6F (TUBING) ×1 IMPLANT
INTRODUCER TRACH BLUE RHINO 8F (TUBING) IMPLANT
PENCIL BUTTON HOLSTER BLD 10FT (ELECTRODE) ×2 IMPLANT
SPONGE DRAIN TRACH 4X4 STRL 2S (GAUZE/BANDAGES/DRESSINGS) ×1 IMPLANT
SPONGE INTESTINAL PEANUT (DISPOSABLE) ×2 IMPLANT
SUT SILK 3 0 SH CR/8 (SUTURE) ×1 IMPLANT
SUT VICRYL AB 3 0 TIES (SUTURE) ×1 IMPLANT
TOWEL OR 17X24 6PK STRL BLUE (TOWEL DISPOSABLE) ×2 IMPLANT
TUBE CONNECTING 12X1/4 (SUCTIONS) ×2 IMPLANT
YANKAUER SUCT BULB TIP NO VENT (SUCTIONS) ×2 IMPLANT

## 2015-01-18 NOTE — Progress Notes (Signed)
Patient ID: Juan [name], male   DOB: Nov 19, 1990, 24 y.o.   MRN: [id number] I spoke with his mother at the bedside. Violeta Gelinas, MD, MPH, FACS Trauma: [phone] General Surgery: [phone]

## 2015-01-18 NOTE — Progress Notes (Signed)
PEG tube and trach placed at bedside per Dr. Janee Morn and PA Stoney Bang, paralytics and pain medications given per orders, vss stable through procedure. ENDO team assisted with PEG placement and OR and RT assisted with trach. Will continue to monitor.
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
## 2015-01-18 NOTE — Progress Notes (Signed)
Orthopedic Tech Progress Note Patient Details:  Juan [name] [birth year] [id number]  Ortho Devices Type of Ortho Device: Abdominal binder Ortho Device/Splint Location: (B) LE Ortho Device/Splint Interventions: Ordered, Application   Cammer, Mickie Bail 01/18/2015, 1:31 PM
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
## 2015-01-18 NOTE — Progress Notes (Signed)
Called Dr. Janee Morn, per [facility] RN request, to verify that PICC line should be placed so soon after + blood culture results.  He stated that the results were preliminary and final results would dictate ID consult.  I informed Dr. Janee Morn that I had received report from 2S RN, Deanna Artis, at 1215 but still had not received the patient. Dr. Janee Morn said that it was ok to wait until tomorrow to place PICC line. Erla Bacchi C 01/18/2015 2:56 PM
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
## 2015-01-18 NOTE — Progress Notes (Signed)
CSW escorted pt's mother to 3W to shower.  Emotional support provided along the way.
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
## 2015-01-18 NOTE — Progress Notes (Signed)
Report given to [facility], pt transferred with RT at bedside, vss, pt with no signs of distress noted.   Juan [name] 4:17 PM

## 2015-01-19 ENCOUNTER — Inpatient Hospital Stay (HOSPITAL_COMMUNITY): Payer: 59

## 2015-01-19 ENCOUNTER — Encounter (HOSPITAL_COMMUNITY): Payer: Self-pay | Admitting: General Surgery

## 2015-01-19 LAB — BASIC METABOLIC PANEL
Anion gap: 6 (ref 5–15)
BUN: 12 mg/dL (ref 6–20)
CO2: 24 mmol/L (ref 22–32)
Calcium: 7.7 mg/dL — ABNORMAL LOW (ref 8.9–10.3)
Chloride: 103 mmol/L (ref 101–111)
Creatinine, Ser: 0.61 mg/dL (ref 0.61–1.24)
GFR calc Af Amer: >60 mL/min (ref >60)
GFR calc non Af Amer: >60 mL/min (ref >60)
Glucose, Bld: 115 mg/dL — ABNORMAL HIGH (ref 65–99)
Potassium: 3.8 mmol/L (ref 3.5–5.1)
Sodium: 133 mmol/L — ABNORMAL LOW (ref 135–145)

## 2015-01-19 LAB — CULTURE, BLOOD (ROUTINE X 2)

## 2015-01-19 LAB — GLUCOSE, CAPILLARY
Glucose-Capillary: 109 mg/dL — ABNORMAL HIGH (ref 65–99)
Glucose-Capillary: 111 mg/dL — ABNORMAL HIGH (ref 65–99)
Glucose-Capillary: 104 mg/dL — ABNORMAL HIGH (ref 65–99)
Glucose-Capillary: 101 mg/dL — ABNORMAL HIGH (ref 65–99)
Glucose-Capillary: 103 mg/dL — ABNORMAL HIGH (ref 65–99)

## 2015-01-19 LAB — CBC
HCT: 23 % — ABNORMAL LOW (ref 39.0–52.0)
Hemoglobin: 7.5 g/dL — ABNORMAL LOW (ref 13.0–17.0)
MCH: 29.4 pg (ref 26.0–34.0)
MCHC: 32.6 g/dL (ref 30.0–36.0)
MCV: 90.2 fL (ref 78.0–100.0)
Platelets: 397 10*3/uL (ref 150–400)
RBC: 2.55 MIL/uL — ABNORMAL LOW (ref 4.22–5.81)
RDW: 13.9 % (ref 11.5–15.5)
WBC: 6.3 10*3/uL (ref 4.0–10.5)

## 2015-01-19 LAB — VANCOMYCIN, TROUGH: Vancomycin Tr: 11 ug/mL (ref 10.0–20.0)

## 2015-01-19 MED ORDER — CLONAZEPAM 0.1 MG/ML ORAL SUSPENSION: 0.5000 mg | Freq: Two times a day (BID) | ORAL | Status: DC

## 2015-01-19 MED ORDER — VANCOMYCIN HCL 10 G IV SOLR
1250.0000 mg | Freq: Three times a day (TID) | INTRAVENOUS | Status: DC
  Administered 2015-01-19 – 2015-01-20 (×5): 1250 mg via INTRAVENOUS
  Filled 2015-01-19 (×5): qty 1250

## 2015-01-19 MED ORDER — CLONAZEPAM 0.5 MG PO TABS
0.5000 mg | ORAL_TABLET | Freq: Two times a day (BID) | ORAL | Status: DC
  Administered 2015-01-19 – 2015-01-21 (×6): 0.5 mg
  Filled 2015-01-19 (×6): qty 1

## 2015-01-19 MED ORDER — FUROSEMIDE 10 MG/ML IJ SOLN
20.0000 mg | Freq: Two times a day (BID) | INTRAMUSCULAR | Status: AC
  Administered 2015-01-19 (×2): 20 mg via INTRAVENOUS
  Filled 2015-01-19 (×2): qty 2

## 2015-01-19 MED ORDER — PROPRANOLOL HCL 20 MG/5ML PO SOLN
20.0000 mg | Freq: Three times a day (TID) | ORAL | Status: DC
  Administered 2015-01-19 – 2015-01-26 (×24): 20 mg
  Filled 2015-01-19 (×27): qty 5

## 2015-01-19 NOTE — Progress Notes (Signed)
Changed pt's circuit on ventilator due to it being soiled. Also changed filter, aerogen set up, ballard and HME. Pt has pink tinged frothy/thin secretions due to new trach 01/18/15. RT will continue to monitor.
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
## 2015-01-19 NOTE — Progress Notes (Signed)
Came to assess patient, noted patient vomiting creamy yellow secretions orally. Patient was suctioned and RN made aware of my findings. Pt is stable at this time, O2 saturations are acceptable at this time.
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
## 2015-01-19 NOTE — Procedures (Signed)
Central Venous Catheter Insertion Procedure Note Juan [name] [id number] 09-30-1990  Procedure: Insertion of Central Venous Catheter Indications: Drug and/or fluid administration  Procedure Details Consent: Risks of procedure as well as the alternatives and risks of each were explained to the (patient/caregiver).  Consent for procedure obtained. Time Out: Verified patient identification, verified procedure, site/side was marked, verified correct patient position, special equipment/implants available, medications/allergies/relevent history reviewed, required imaging and test results available.  Performed  Maximum sterile technique was used including antiseptics, cap, gloves, gown, hand hygiene, mask and sheet. Skin prep: Chlorhexidine; local anesthetic administered A antimicrobial bonded/coated triple lumen catheter was placed in the L SCV.  Evaluation Blood flow good Complications: No apparent complications Patient did tolerate procedure well. Chest X-ray ordered to verify placement.  CXR: pending.  Juan [name] E 01/19/2015, 10:27 AM
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
## 2015-01-19 NOTE — Progress Notes (Addendum)
Patient ID: Juan [name], male   DOB: [birth year], 24 y.o.   MRN: [id number] 2/2 GPC in blood. Will place antibiotic impregnated TLC and remove old one. Violeta Gelinas, MD, MPH, FACS Trauma: [phone] General Surgery: [phone]

## 2015-01-20 ENCOUNTER — Inpatient Hospital Stay (HOSPITAL_COMMUNITY): Payer: 59

## 2015-01-20 LAB — CBC
HCT: 24.1 % — ABNORMAL LOW (ref 39.0–52.0)
Hemoglobin: 8.1 g/dL — ABNORMAL LOW (ref 13.0–17.0)
MCH: 30.7 pg (ref 26.0–34.0)
MCHC: 33.6 g/dL (ref 30.0–36.0)
MCV: 91.3 fL (ref 78.0–100.0)
Platelets: 396 10*3/uL (ref 150–400)
RBC: 2.64 MIL/uL — ABNORMAL LOW (ref 4.22–5.81)
RDW: 13.9 % (ref 11.5–15.5)
WBC: 6.8 10*3/uL (ref 4.0–10.5)

## 2015-01-20 LAB — BASIC METABOLIC PANEL
Anion gap: 7 (ref 5–15)
BUN: 17 mg/dL (ref 6–20)
CO2: 25 mmol/L (ref 22–32)
Calcium: 7.9 mg/dL — ABNORMAL LOW (ref 8.9–10.3)
Chloride: 104 mmol/L (ref 101–111)
Creatinine, Ser: 0.63 mg/dL (ref 0.61–1.24)
GFR calc Af Amer: >60 mL/min (ref >60)
GFR calc non Af Amer: >60 mL/min (ref >60)
Glucose, Bld: 137 mg/dL — ABNORMAL HIGH (ref 65–99)
Potassium: 3.4 mmol/L — ABNORMAL LOW (ref 3.5–5.1)
Sodium: 136 mmol/L (ref 135–145)

## 2015-01-20 LAB — GLUCOSE, CAPILLARY
Glucose-Capillary: 112 mg/dL — ABNORMAL HIGH (ref 65–99)
Glucose-Capillary: 118 mg/dL — ABNORMAL HIGH (ref 65–99)
Glucose-Capillary: 124 mg/dL — ABNORMAL HIGH (ref 65–99)
Glucose-Capillary: 126 mg/dL — ABNORMAL HIGH (ref 65–99)
Glucose-Capillary: 137 mg/dL — ABNORMAL HIGH (ref 65–99)
Glucose-Capillary: 140 mg/dL — ABNORMAL HIGH (ref 65–99)
Glucose-Capillary: 97 mg/dL (ref 65–99)

## 2015-01-20 LAB — CULTURE, BLOOD (ROUTINE X 2)

## 2015-01-20 LAB — VANCOMYCIN, TROUGH: Vancomycin Tr: 12 ug/mL (ref 10.0–20.0)

## 2015-01-20 MED ORDER — VANCOMYCIN HCL 10 G IV SOLR
1750.0000 mg | Freq: Once | INTRAVENOUS | Status: AC
  Administered 2015-01-21: 1750 mg via INTRAVENOUS
  Filled 2015-01-20: qty 1750

## 2015-01-20 MED ORDER — VANCOMYCIN HCL 10 G IV SOLR
1250.0000 mg | Freq: Three times a day (TID) | INTRAVENOUS | Status: DC
  Administered 2015-01-21 – 2015-01-23 (×5): 1250 mg via INTRAVENOUS
  Filled 2015-01-20 (×7): qty 1250
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
## 2015-01-21 LAB — GLUCOSE, CAPILLARY
Glucose-Capillary: 106 mg/dL — ABNORMAL HIGH (ref 65–99)
Glucose-Capillary: 106 mg/dL — ABNORMAL HIGH (ref 65–99)
Glucose-Capillary: 112 mg/dL — ABNORMAL HIGH (ref 65–99)
Glucose-Capillary: 115 mg/dL — ABNORMAL HIGH (ref 65–99)
Glucose-Capillary: 116 mg/dL — ABNORMAL HIGH (ref 65–99)
Glucose-Capillary: 123 mg/dL — ABNORMAL HIGH (ref 65–99)

## 2015-01-21 MED ORDER — BACITRACIN-NEOMYCIN-POLYMYXIN OINTMENT TUBE
TOPICAL_OINTMENT | Freq: Two times a day (BID) | CUTANEOUS | Status: DC
  Administered 2015-01-21: 22:00:00 via TOPICAL
  Administered 2015-01-21: 1 via TOPICAL
  Administered 2015-01-22 – 2015-01-24 (×6): via TOPICAL
  Administered 2015-01-25: 1 via TOPICAL
  Administered 2015-01-25: 22:00:00 via TOPICAL
  Administered 2015-01-26: 1 via TOPICAL
  Administered 2015-01-26 – 2015-01-27 (×3): via TOPICAL
  Administered 2015-01-28 (×2): 1 via TOPICAL
  Administered 2015-01-29: 22:00:00 via TOPICAL
  Administered 2015-01-29: 1 via TOPICAL
  Administered 2015-01-30 (×2): via TOPICAL
  Administered 2015-01-31: 1 via TOPICAL
  Administered 2015-01-31 – 2015-02-03 (×6): via TOPICAL
  Administered 2015-02-03: 1 via TOPICAL
  Administered 2015-02-04: 22:00:00 via TOPICAL
  Administered 2015-02-04: 1 via TOPICAL
  Administered 2015-02-05 – 2015-02-12 (×14): via TOPICAL
  Filled 2015-01-21 (×3): qty 15

## 2015-01-21 MED ORDER — LEVETIRACETAM 100 MG/ML PO SOLN
1500.0000 mg | Freq: Two times a day (BID) | ORAL | Status: DC
  Administered 2015-01-21 – 2015-02-13 (×47): 1500 mg
  Filled 2015-01-21 (×53): qty 15

## 2015-01-21 NOTE — Progress Notes (Signed)
Placed pt on 8L/35% TC. Rn aware. RT will continue to monitor.

## 2015-01-21 NOTE — Progress Notes (Signed)
Pt weaned on Vent  15/5 from 10:45 to 15:10. Pt placed on TC at 8L/35% at 15:10. Pt placed back on Vent on F/S at 1654 due to decreased spo2 and pt getting tired. Will wean again 9/5 if pt tolerates. RT will continue to monitor.
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
## 2015-01-21 NOTE — Progress Notes (Signed)
Placed pt on 15/5 for a weaning trial. Rt will closely monitor pt's HR, RR, and agitation. RN aware.

## 2015-01-22 ENCOUNTER — Inpatient Hospital Stay (HOSPITAL_COMMUNITY): Payer: 59

## 2015-01-22 LAB — BASIC METABOLIC PANEL WITH GFR
Anion gap: 6 (ref 5–15)
BUN: 17 mg/dL (ref 6–20)
CO2: 23 mmol/L (ref 22–32)
Calcium: 7.7 mg/dL — ABNORMAL LOW (ref 8.9–10.3)
Chloride: 104 mmol/L (ref 101–111)
Creatinine, Ser: 0.51 mg/dL — ABNORMAL LOW (ref 0.61–1.24)
GFR calc Af Amer: >60 mL/min
GFR calc non Af Amer: >60 mL/min
Glucose, Bld: 133 mg/dL — ABNORMAL HIGH (ref 65–99)
Potassium: 3.8 mmol/L (ref 3.5–5.1)
Sodium: 133 mmol/L — ABNORMAL LOW (ref 135–145)

## 2015-01-22 LAB — CBC
HCT: 24.5 % — ABNORMAL LOW (ref 39.0–52.0)
Hemoglobin: 7.8 g/dL — ABNORMAL LOW (ref 13.0–17.0)
MCH: 29.1 pg (ref 26.0–34.0)
MCHC: 31.8 g/dL (ref 30.0–36.0)
MCV: 91.4 fL (ref 78.0–100.0)
Platelets: 376 10*3/uL (ref 150–400)
RBC: 2.68 MIL/uL — ABNORMAL LOW (ref 4.22–5.81)
RDW: 13.8 % (ref 11.5–15.5)
WBC: 7.3 10*3/uL (ref 4.0–10.5)

## 2015-01-22 LAB — GLUCOSE, CAPILLARY
Glucose-Capillary: 102 mg/dL — ABNORMAL HIGH (ref 65–99)
Glucose-Capillary: 108 mg/dL — ABNORMAL HIGH (ref 65–99)
Glucose-Capillary: 111 mg/dL — ABNORMAL HIGH (ref 65–99)
Glucose-Capillary: 115 mg/dL — ABNORMAL HIGH (ref 65–99)
Glucose-Capillary: 121 mg/dL — ABNORMAL HIGH (ref 65–99)
Glucose-Capillary: 132 mg/dL — ABNORMAL HIGH (ref 65–99)

## 2015-01-22 MED ORDER — PIVOT 1.5 CAL PO LIQD
1000.0000 mL | ORAL | Status: DC
  Administered 2015-01-22 – 2015-02-12 (×19): 1000 mL
  Filled 2015-01-22 (×40): qty 1000

## 2015-01-22 MED ORDER — CLONAZEPAM 1 MG PO TABS
1.0000 mg | ORAL_TABLET | Freq: Two times a day (BID) | ORAL | Status: DC
  Administered 2015-01-22 (×2): 1 mg
  Filled 2015-01-22 (×2): qty 1
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
## 2015-01-23 ENCOUNTER — Encounter (HOSPITAL_COMMUNITY): Payer: Self-pay | Admitting: Orthopedic Surgery

## 2015-01-23 LAB — GLUCOSE, CAPILLARY
Glucose-Capillary: 107 mg/dL — ABNORMAL HIGH (ref 65–99)
Glucose-Capillary: 114 mg/dL — ABNORMAL HIGH (ref 65–99)
Glucose-Capillary: 125 mg/dL — ABNORMAL HIGH (ref 65–99)
Glucose-Capillary: 107 mg/dL — ABNORMAL HIGH (ref 65–99)
Glucose-Capillary: 121 mg/dL — ABNORMAL HIGH (ref 65–99)

## 2015-01-23 LAB — VANCOMYCIN, TROUGH: Vancomycin Tr: 13 ug/mL (ref 10.0–20.0)

## 2015-01-23 MED ORDER — FENTANYL 25 MCG/HR TD PT72
100.0000 ug | MEDICATED_PATCH | TRANSDERMAL | Status: DC
  Administered 2015-01-23 – 2015-02-07 (×6): 100 ug via TRANSDERMAL
  Filled 2015-01-23: qty 1
  Filled 2015-01-23: qty 2
  Filled 2015-01-23 (×2): qty 1
  Filled 2015-01-23 (×2): qty 2
  Filled 2015-01-23 (×2): qty 1

## 2015-01-23 MED ORDER — VANCOMYCIN HCL 10 G IV SOLR
1500.0000 mg | Freq: Three times a day (TID) | INTRAVENOUS | Status: DC
  Administered 2015-01-23 – 2015-01-24 (×5): 1500 mg via INTRAVENOUS
  Filled 2015-01-23 (×6): qty 1500

## 2015-01-23 MED ORDER — CLONAZEPAM 1 MG PO TABS
2.0000 mg | ORAL_TABLET | Freq: Two times a day (BID) | ORAL | Status: DC
  Administered 2015-01-23 – 2015-01-26 (×8): 2 mg
  Filled 2015-01-23 (×9): qty 2

## 2015-01-23 MED ORDER — QUETIAPINE FUMARATE 50 MG PO TABS
50.0000 mg | ORAL_TABLET | Freq: Every day | ORAL | Status: DC
  Administered 2015-01-23: 50 mg
  Filled 2015-01-23 (×2): qty 1

## 2015-01-23 NOTE — Progress Notes (Signed)
Pt is resting comfortable at this time no neuro storming noted, O2 saturations presents at 100% fio2 30%.
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
## 2015-01-23 NOTE — Progress Notes (Signed)
Pt placed on PSV trial at this time.  Increased PS to 12 due to decreased VT's, pt tolerating well.  RT will monitor.
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
## 2015-01-24 ENCOUNTER — Inpatient Hospital Stay (HOSPITAL_COMMUNITY): Payer: 59

## 2015-01-24 DIAGNOSIS — S7222XS Displaced subtrochanteric fracture of left femur, sequela: Secondary | ICD-10-CM

## 2015-01-24 DIAGNOSIS — G8250 Quadriplegia, unspecified: Secondary | ICD-10-CM

## 2015-01-24 LAB — BASIC METABOLIC PANEL
Anion gap: 5 (ref 5–15)
BUN: 15 mg/dL (ref 6–20)
CO2: 25 mmol/L (ref 22–32)
Calcium: 8.2 mg/dL — ABNORMAL LOW (ref 8.9–10.3)
Chloride: 109 mmol/L (ref 101–111)
Creatinine, Ser: 0.52 mg/dL — ABNORMAL LOW (ref 0.61–1.24)
GFR calc Af Amer: >60 mL/min (ref >60)
GFR calc non Af Amer: >60 mL/min (ref >60)
Glucose, Bld: 121 mg/dL — ABNORMAL HIGH (ref 65–99)
Potassium: 3.8 mmol/L (ref 3.5–5.1)
Sodium: 139 mmol/L (ref 135–145)

## 2015-01-24 LAB — POCT I-STAT 3, ART BLOOD GAS (G3+)
Acid-Base Excess: 2 mmol/L (ref 0.0–2.0)
Bicarbonate: 24.8 mEq/L — ABNORMAL HIGH (ref 20.0–24.0)
Bicarbonate: 24.8 mEq/L — ABNORMAL HIGH (ref 20.0–24.0)
O2 Saturation: 97 %
O2 Saturation: 98 %
Patient temperature: 97.7
Patient temperature: 98.8
TCO2: 26 mmol/L (ref 0–100)
TCO2: 26 mmol/L (ref 0–100)
pCO2 arterial: 30.7 mmHg — ABNORMAL LOW (ref 35.0–45.0)
pCO2 arterial: 40.3 mmHg (ref 35.0–45.0)
pH, Arterial: 7.512 — ABNORMAL HIGH (ref 7.350–7.450)
pH, Arterial: 7.397 (ref 7.350–7.450)
pO2, Arterial: 81 mmHg (ref 80.0–100.0)
pO2, Arterial: 111 mmHg — ABNORMAL HIGH (ref 80.0–100.0)

## 2015-01-24 LAB — CBC WITH DIFFERENTIAL/PLATELET
Basophils Absolute: 0 10*3/uL (ref 0.0–0.1)
Basophils Relative: 0 % (ref 0–1)
Eosinophils Absolute: 0.2 10*3/uL (ref 0.0–0.7)
Eosinophils Relative: 4 % (ref 0–5)
HCT: 25.7 % — ABNORMAL LOW (ref 39.0–52.0)
Hemoglobin: 8.2 g/dL — ABNORMAL LOW (ref 13.0–17.0)
Lymphocytes Relative: 34 % (ref 12–46)
Lymphs Abs: 1.6 10*3/uL (ref 0.7–4.0)
MCH: 29.2 pg (ref 26.0–34.0)
MCHC: 31.9 g/dL (ref 30.0–36.0)
MCV: 91.5 fL (ref 78.0–100.0)
Monocytes Absolute: 0.6 10*3/uL (ref 0.1–1.0)
Monocytes Relative: 13 % — ABNORMAL HIGH (ref 3–12)
Neutro Abs: 2.4 10*3/uL (ref 1.7–7.7)
Neutrophils Relative %: 49 % (ref 43–77)
Platelets: 375 10*3/uL (ref 150–400)
RBC: 2.81 MIL/uL — ABNORMAL LOW (ref 4.22–5.81)
RDW: 14 % (ref 11.5–15.5)
WBC: 4.9 10*3/uL (ref 4.0–10.5)

## 2015-01-24 LAB — GLUCOSE, CAPILLARY
Glucose-Capillary: 115 mg/dL — ABNORMAL HIGH (ref 65–99)
Glucose-Capillary: 106 mg/dL — ABNORMAL HIGH (ref 65–99)
Glucose-Capillary: 123 mg/dL — ABNORMAL HIGH (ref 65–99)
Glucose-Capillary: 106 mg/dL — ABNORMAL HIGH (ref 65–99)
Glucose-Capillary: 102 mg/dL — ABNORMAL HIGH (ref 65–99)
Glucose-Capillary: 101 mg/dL — ABNORMAL HIGH (ref 65–99)

## 2015-01-24 LAB — VANCOMYCIN, TROUGH: Vancomycin Tr: 23 ug/mL — ABNORMAL HIGH (ref 10.0–20.0)

## 2015-01-24 MED ORDER — QUETIAPINE FUMARATE 100 MG PO TABS
100.0000 mg | ORAL_TABLET | Freq: Every day | ORAL | Status: DC
  Administered 2015-01-24 – 2015-01-26 (×3): 100 mg
  Filled 2015-01-24 (×5): qty 1

## 2015-01-24 MED ORDER — SODIUM CHLORIDE 0.9 % IV SOLN
1250.0000 mg | Freq: Three times a day (TID) | INTRAVENOUS | Status: DC
  Administered 2015-01-25 – 2015-01-27 (×7): 1250 mg via INTRAVENOUS
  Filled 2015-01-24 (×9): qty 1250

## 2015-01-24 NOTE — Progress Notes (Signed)
SLP Cancellation Note  Patient Details Name: Vartan Kerins MRN: [id number] DOB: [birth year]   Cancelled treatment:       Reason Eval/Treat Not Completed: Medical issues which prohibited therapy Patient heavily sedated per RN. PT/OT going to see later this am when sedation can be lifted following RN morning assessment.   Ferdinand Lango MA, CCC-SLP [phone]   Alexanderia Gorby Meryl 01/24/2015, 10:14 AM
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
## 2015-01-24 NOTE — Progress Notes (Signed)
Pt is breathing around his trach, pt may possible need a extra large trach. Pt is stable at this time. RN aware
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
## 2015-01-24 NOTE — Progress Notes (Signed)
[facility] [place] Levels I-VI of Cognitive Functioning-Daily Tracking Sheet         Date of Onset _________  Level of function Behavioral Characteristics Initial Eval.  Date:  01/24/15  Date and initial when observed   Level I No response Total Assistance Complete absence of observable change in behavior when presented visual, auditory, tactile, proprioceptive, vestibular or painful stimuli.             Level II Generalized response  Total Assistance Demonstrates generalized reflex response to painful stimuli 9/7 HW      Responds to repeated auditory stimuli with increased or decreased activity       Responds to external stimuli with physiological changes generalized, gross body movement and / or not purposeful vocalization  9/7 HW             Responses noted above may be same regardless of type and location of stimuli  9/7 HW      Responses may be significantly delayed 9/7 HW     Level III Localized response  Total Assistance Demonstrates withdrawal or vocalization to painful stimuli 9/12 hw      Turns toward or away from auditory stimuli  9/12 hw      Blinks when strong light crosses visual field        Follows moving object passed within visual field   9/17 DW     Responds to discomfort by pulling tubes or restraints       Responds inconsistently to simple commands 9/16 HW      Responses directly related to type of stimulus  9/17     May respond to some persons (especially friends and family) but not to others 9/12 hw      Level IV Confused/Agitated  Maximal Assistance       Alert and in heightened state of anxiety   9/21 DW     Purposeful attempts to remove restraints or tubes or crawl out of bed        May perform motor activities such as sitting, reaching and walking without any apparent purpose or upon another's request        Brief and usually non purposeful moments of focused and sustained attention  9/20 DW     Post  traumatic amnesia state        Absent goal directed, problem solving, self monitoring behavior       May cry or scream out of proportion to stimulus even after it's removal       May exhibit aggressive or flight behavior        Mood swing from euphoric to hostile with no apparent relationship to environmental events        Verbalizations are frequently incoherent and/or inappropriate to activity or environment             Level V   Confused/InappropriateNon-Agitated  Maximal Assistance Alert, not agitated but may wander randomly or with vague intention of going home        May become agitated in response to external stimulation and/or lack of environmental structure.       Not orientated to person, place and time.       Frequent brief periods, non-purposeful sustained attention.       Severely impaired recent memory, with confusion of past and present in reaction to ongoing activity.        Absent goal directed, problem solving behavior.        Often  demonstrates inappropriate use of objects without external direction.       May be able to perform previously learned tasks when structure and cues provided.       Unable to learn new information       Able to respond appropriately to simple commands fairly consistently with external structure and cues.       Responses to simple commands without external structure are random and non-purposeful in relation to the command       Able to converse on a social, automatic level for brief periods of time when provided external structure and cues.        Verbalizations about present events become inappropriate and confabulatory when external structure and cues are not provided.                Level of function Level VI Confused/Appropriate  Maximal Assistance Behavioral Characteristics Initial Eval.  Date: _____ Date and initial when observed    Able to attend to highly familiar tasks in non-distracting environment  for 30 minutes with moderate redirection.       Remote memory has more depth and detail than recent memory        Vague recognition of some staff.       Able to use assistive memory aide with maximal assist.       Emerging awareness of appropriate response to self, family and basic needs.        Emerging goal directed behavior.        Moderate assist to problem solve barriers to task completion.        Supervised for old learning (e.g. self care).       Shows carry over for relearned familiar tasks (e.g. self care).       Maximal assistance for new learning with little or no carry over.       Unaware of impairments, disabilities and safety risks.       Consistently follows simple directions       Verbal expressions are appropriate in highly familiar and structured situations.       DO  NOT SIGN NOTE. ALWAYS SHARE UNLESS PATIENT HAS PROGRESSED BEYOND A RANCHO LEVEL VI. THIS WAY, ALL TEAM MEMBERS HAVE ACCESS TO DOCUMENT ON TRACKING SHEET. THANK YOU.
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
## 2015-01-24 NOTE — Progress Notes (Signed)
Orthopedic Tech Progress Note Patient Details:  Juan [name] [birth year] [id number]  Patient ID: Juan [name], male   DOB: 04/12/1991, 24 y.o.   MRN: [id number] Called in bio-tech brace order; spoke with Lenore Manner, Marilea Gwynne 01/24/2015, 12:32 PM

## 2015-01-24 NOTE — Progress Notes (Signed)
Rehab Admissions Coordinator Note:  Patient was screened by Clois Dupes for appropriateness for an Inpatient Acute Rehab Consult per TBI team assessment. At this time, we are recommending Inpatient Rehab consult once pt has progressed further medically and functionally. I will follow.  Clois Dupes 01/24/2015, 12:48 PM  I can be reached at [phone].

## 2015-01-25 LAB — GLUCOSE, CAPILLARY
Glucose-Capillary: 100 mg/dL — ABNORMAL HIGH (ref 65–99)
Glucose-Capillary: 106 mg/dL — ABNORMAL HIGH (ref 65–99)
Glucose-Capillary: 106 mg/dL — ABNORMAL HIGH (ref 65–99)
Glucose-Capillary: 118 mg/dL — ABNORMAL HIGH (ref 65–99)
Glucose-Capillary: 119 mg/dL — ABNORMAL HIGH (ref 65–99)
Glucose-Capillary: 124 mg/dL — ABNORMAL HIGH (ref 65–99)

## 2015-01-25 MED ORDER — BROMOCRIPTINE MESYLATE 2.5 MG PO TABS
2.5000 mg | ORAL_TABLET | Freq: Two times a day (BID) | ORAL | Status: DC
  Administered 2015-01-25 – 2015-01-26 (×4): 2.5 mg
  Filled 2015-01-25 (×8): qty 1
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
## 2015-01-25 NOTE — Progress Notes (Signed)
I met with pt's Mom and his Maternal Grandmother in waiting room to begin discussions of eventual rehab venues. Pt has [employer] and Mom is aware that other outside inpt rehab venues may also be a possibility. I will follow his progress to assist with planning rehab venue options. [phone]

## 2015-01-26 LAB — BASIC METABOLIC PANEL
Anion gap: 6 (ref 5–15)
BUN: 18 mg/dL (ref 6–20)
CO2: 28 mmol/L (ref 22–32)
Calcium: 8.7 mg/dL — ABNORMAL LOW (ref 8.9–10.3)
Chloride: 102 mmol/L (ref 101–111)
Creatinine, Ser: 0.59 mg/dL — ABNORMAL LOW (ref 0.61–1.24)
GFR calc Af Amer: >60 mL/min (ref >60)
GFR calc non Af Amer: >60 mL/min (ref >60)
Glucose, Bld: 99 mg/dL (ref 65–99)
Potassium: 5.1 mmol/L (ref 3.5–5.1)
Sodium: 136 mmol/L (ref 135–145)

## 2015-01-26 LAB — GLUCOSE, CAPILLARY
Glucose-Capillary: 103 mg/dL — ABNORMAL HIGH (ref 65–99)
Glucose-Capillary: 106 mg/dL — ABNORMAL HIGH (ref 65–99)
Glucose-Capillary: 114 mg/dL — ABNORMAL HIGH (ref 65–99)
Glucose-Capillary: 121 mg/dL — ABNORMAL HIGH (ref 65–99)
Glucose-Capillary: 99 mg/dL (ref 65–99)

## 2015-01-26 LAB — VANCOMYCIN, TROUGH: Vancomycin Tr: 17 ug/mL (ref 10.0–20.0)
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
## 2015-01-26 NOTE — Progress Notes (Signed)
PT Cancellation Note  Patient Details Name: Juan [name] MRN: [id number] DOB: 1990/09/07   Cancelled Treatment:    Reason Eval/Treat Not Completed: Other (comment)Patient resumed on sedation for rest per chart, will allow patient to rest, will continue to follow acutely for TBI team treatments.   [name] 01/26/2015, 5:18 PM Charlotte Crumb, PT DPT  [phone]

## 2015-01-26 NOTE — Progress Notes (Signed)
Pt tolerating trach collar and tube feedings well.  Pt with continued neuro storming; diaphoretic and tachycardic at times.  Pt continues on IV Vancomycin for pneumonia.  Family supportive and at bedside daily.    Will continue to follow progress.    Quintella Baton, RN, BSN  Trauma/Neuro ICU Case Manager [phone]

## 2015-01-26 NOTE — Progress Notes (Signed)
Pt has not rested at all since 0700. Severe sweating, tremors, constant coughing. I feel that his storming is compromising his rest and his continued trach collar weaning. Sedative and fentanyl resumed to higher doses to ensure patient has a period of rest. As of 15:30, pt is resting, not coughing, tolerating TC well, VSS.

## 2015-01-26 NOTE — Progress Notes (Signed)
Trauma Service Note  Subjective: Patient still on trach collar.  Storming currently.  Objective: Vital signs in last 24 hours: Temp:  [97.5 F (36.4 C)-98.8 F (37.1 C)] 97.5 F (36.4 C) (09/09 0734) Pulse Rate:  [70-117] 117 (09/09 0933) Resp:  [11-31] 25 (09/09 0800) BP: (91-136)/(46-88) 107/59 mmHg (09/09 0933) SpO2:  [94 %-100 %] 98 % (09/09 0800) FiO2 (%):  [28 %] 28 % (09/09 0739) Weight:  [83.2 kg (183 lb 6.8 oz)] 83.2 kg (183 lb 6.8 oz) (09/09 0356) Last BM Date: 01/22/15  Intake/Output from previous day: 09/08 0701 - 09/09 0700 In: 2813.6 [I.V.:1738.6; NG/GT:780; IV Piggyback:295] Out: 2725 [Urine:1350; Drains:1375] Intake/Output this shift: Total I/O In: 115 [I.V.:50; NG/GT:65] Out: -   General: Storming.  Drenched in sweat!  Lungs: Clear, but secretions are more today, patient coughing a lot.  Stayed off the ventilator all day yesterday.  Abd: Tolerating tube feedings well.   No vomiting  Extremities: No changes  Neuro: Storming.  Rancho II.  Diaphoretic.  Will not follow commands.  Lab Results: CBC   Recent Labs  01/24/15 0502  WBC 4.9  HGB 8.2*  HCT 25.7*  PLT 375   BMET  Recent Labs  01/24/15 0502  NA 139  K 3.8  CL 109  CO2 25  GLUCOSE 121*  BUN 15  CREATININE 0.52*  CALCIUM 8.2*   PT/INR No results for input(s): LABPROT, INR in the last 72 hours. ABG  Recent Labs  01/24/15 0358 01/24/15 0946  PHART 7.512* 7.397  HCO3 24.8* 24.8*    Studies/Results: No results found.  Anti-infectives: Anti-infectives    Start     Dose/Rate Route Frequency Ordered Stop   01/25/15 0200  vancomycin (VANCOCIN) 1,250 mg in sodium chloride 0.9 % 250 mL IVPB     1,250 mg 166.7 mL/hr over 90 Minutes Intravenous Every 8 hours 01/24/15 2154     01/23/15 1000  vancomycin (VANCOCIN) 1,500 mg in sodium chloride 0.9 % 500 mL IVPB  Status:  Discontinued     1,500 mg 250 mL/hr over 120 Minutes Intravenous Every 8 hours 01/23/15 0922 01/24/15 2154   01/21/15 1700  vancomycin (VANCOCIN) 1,250 mg in sodium chloride 0.9 % 250 mL IVPB  Status:  Discontinued     1,250 mg 166.7 mL/hr over 90 Minutes Intravenous Every 8 hours 01/20/15 2350 01/23/15 0922   01/21/15 0900  vancomycin (VANCOCIN) 1,750 mg in sodium chloride 0.9 % 500 mL IVPB     1,750 mg 250 mL/hr over 120 Minutes Intravenous  Once 01/20/15 2350 01/21/15 1120   01/19/15 1700  vancomycin (VANCOCIN) 1,250 mg in sodium chloride 0.9 % 250 mL IVPB  Status:  Discontinued     1,250 mg 166.7 mL/hr over 90 Minutes Intravenous Every 8 hours 01/19/15 1602 01/20/15 2350   01/18/15 0800  vancomycin (VANCOCIN) IVPB 1000 mg/200 mL premix  Status:  Discontinued     1,000 mg 200 mL/hr over 60 Minutes Intravenous Every 8 hours 01/18/15 0727 01/19/15 1602   01/16/15 0500  ceFAZolin (ANCEF) IVPB 2 g/50 mL premix     2 g 100 mL/hr over 30 Minutes Intravenous 3 times per day 01/15/15 2231 01/17/15 1530   01/11/15 0915  piperacillin-tazobactam (ZOSYN) IVPB 3.375 g  Status:  Discontinued     3.375 g 12.5 mL/hr over 240 Minutes Intravenous 3 times per day 01/11/15 0845 01/14/15 1029   01/09/15 1500  ceFAZolin (ANCEF) IVPB 2 g/50 mL premix     2 g 100  mL/hr over 30 Minutes Intravenous To ShortStay Surgical 01/08/15 1208 01/10/15 1500   01/07/15 1400  gentamicin (GARAMYCIN) 500 mg in dextrose 5 % 100 mL IVPB  Status:  Discontinued     500 mg 112.5 mL/hr over 60 Minutes Intravenous Every 24 hours 01/07/15 1230 01/11/15 1555   01/07/15 0800  gentamicin (GARAMYCIN) 500 mg in dextrose 5 % 100 mL IVPB  Status:  Discontinued     500 mg 112.5 mL/hr over 60 Minutes Intravenous Every 24 hours 01/06/15 1956 01/07/15 1230   01/06/15 2000  ceFAZolin (ANCEF) IVPB 2 g/50 mL premix  Status:  Discontinued     2 g 100 mL/hr over 30 Minutes Intravenous Every 8 hours 01/06/15 1836 01/11/15 0848   01/06/15 1015  gentamicin (GARAMYCIN) 360 mg in dextrose 5 % 100 mL IVPB     5 mg/kg  72.6 kg 109 mL/hr over 60 Minutes  Intravenous To Surgery 01/06/15 1011 01/06/15 1044   01/06/15 1000  gentamicin (GARAMYCIN) 300 mg in dextrose 5 % 50 mL IVPB  Status:  Discontinued     300 mg 115 mL/hr over 30 Minutes Intravenous  Once 01/06/15 0945 01/06/15 1011   01/06/15 0830  ceFAZolin (ANCEF) IVPB 2 g/50 mL premix     2 g 100 mL/hr over 30 Minutes Intravenous  Once 01/06/15 0819 01/06/15 1000      Assessment/Plan: s/p Procedure(s): PERCUTANEOUS TRACHEOSTOMY (BEDSIDE) CPM.  Will consider transfer over the weekend to SDU if he remains off the ventilator.  LOS: 20 days   Marta Lamas. Gae Bon, MD, FACS [phone] Trauma Surgeon 01/26/2015

## 2015-01-27 ENCOUNTER — Inpatient Hospital Stay (HOSPITAL_COMMUNITY): Payer: 59

## 2015-01-27 LAB — GLUCOSE, CAPILLARY
Glucose-Capillary: 101 mg/dL — ABNORMAL HIGH (ref 65–99)
Glucose-Capillary: 103 mg/dL — ABNORMAL HIGH (ref 65–99)
Glucose-Capillary: 104 mg/dL — ABNORMAL HIGH (ref 65–99)
Glucose-Capillary: 108 mg/dL — ABNORMAL HIGH (ref 65–99)
Glucose-Capillary: 108 mg/dL — ABNORMAL HIGH (ref 65–99)
Glucose-Capillary: 89 mg/dL (ref 65–99)
Glucose-Capillary: 97 mg/dL (ref 65–99)

## 2015-01-27 LAB — BASIC METABOLIC PANEL
Anion gap: 7 (ref 5–15)
BUN: 19 mg/dL (ref 6–20)
CO2: 29 mmol/L (ref 22–32)
Calcium: 9 mg/dL (ref 8.9–10.3)
Chloride: 102 mmol/L (ref 101–111)
Creatinine, Ser: 0.54 mg/dL — ABNORMAL LOW (ref 0.61–1.24)
GFR calc Af Amer: >60 mL/min (ref >60)
GFR calc non Af Amer: >60 mL/min (ref >60)
Glucose, Bld: 115 mg/dL — ABNORMAL HIGH (ref 65–99)
Potassium: 4 mmol/L (ref 3.5–5.1)
Sodium: 138 mmol/L (ref 135–145)

## 2015-01-27 LAB — CBC WITH DIFFERENTIAL/PLATELET
Basophils Absolute: 0 10*3/uL (ref 0.0–0.1)
Basophils Relative: 1 % (ref 0–1)
Eosinophils Absolute: 0.4 10*3/uL (ref 0.0–0.7)
Eosinophils Relative: 6 % — ABNORMAL HIGH (ref 0–5)
HCT: 29.9 % — ABNORMAL LOW (ref 39.0–52.0)
Hemoglobin: 9.2 g/dL — ABNORMAL LOW (ref 13.0–17.0)
Lymphocytes Relative: 32 % (ref 12–46)
Lymphs Abs: 2.1 10*3/uL (ref 0.7–4.0)
MCH: 28.6 pg (ref 26.0–34.0)
MCHC: 30.8 g/dL (ref 30.0–36.0)
MCV: 92.9 fL (ref 78.0–100.0)
Monocytes Absolute: 0.8 10*3/uL (ref 0.1–1.0)
Monocytes Relative: 12 % (ref 3–12)
Neutro Abs: 3.3 10*3/uL (ref 1.7–7.7)
Neutrophils Relative %: 49 % (ref 43–77)
Platelets: 376 10*3/uL (ref 150–400)
RBC: 3.22 MIL/uL — ABNORMAL LOW (ref 4.22–5.81)
RDW: 14 % (ref 11.5–15.5)
WBC: 6.6 10*3/uL (ref 4.0–10.5)

## 2015-01-27 MED ORDER — PROPRANOLOL HCL 20 MG/5ML PO SOLN
40.0000 mg | Freq: Three times a day (TID) | ORAL | Status: DC
  Administered 2015-01-27 – 2015-01-28 (×2): 40 mg
  Filled 2015-01-27 (×6): qty 10

## 2015-01-27 MED ORDER — FUROSEMIDE 10 MG/ML PO SOLN
40.0000 mg | Freq: Two times a day (BID) | ORAL | Status: DC
  Administered 2015-01-27 – 2015-01-28 (×2): 40 mg
  Filled 2015-01-27 (×5): qty 4

## 2015-01-27 MED ORDER — CLONAZEPAM 1 MG PO TABS
2.0000 mg | ORAL_TABLET | Freq: Three times a day (TID) | ORAL | Status: DC
  Administered 2015-01-27 – 2015-02-03 (×24): 2 mg
  Filled 2015-01-27 (×15): qty 2
  Filled 2015-01-27: qty 4
  Filled 2015-01-27 (×8): qty 2

## 2015-01-27 MED ORDER — FUROSEMIDE 8 MG/ML PO SOLN
40.0000 mg | Freq: Two times a day (BID) | ORAL | Status: DC
  Filled 2015-01-27 (×3): qty 5

## 2015-01-27 MED ORDER — BROMOCRIPTINE MESYLATE 2.5 MG PO TABS
2.5000 mg | ORAL_TABLET | Freq: Three times a day (TID) | ORAL | Status: DC
  Administered 2015-01-27 – 2015-02-13 (×52): 2.5 mg
  Filled 2015-01-27 (×60): qty 1

## 2015-01-27 MED ORDER — QUETIAPINE FUMARATE 100 MG PO TABS
100.0000 mg | ORAL_TABLET | Freq: Three times a day (TID) | ORAL | Status: DC
  Administered 2015-01-27 (×3): 100 mg
  Filled 2015-01-27 (×5): qty 1

## 2015-01-27 MED ORDER — PANTOPRAZOLE SODIUM 40 MG PO PACK
40.0000 mg | PACK | Freq: Every day | ORAL | Status: DC
  Administered 2015-01-27 – 2015-02-13 (×18): 40 mg
  Filled 2015-01-27 (×21): qty 20
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
## 2015-01-27 NOTE — Progress Notes (Signed)
Noted the start of breakdown under the Trach Flanges to RN on Juan [name]. Both Right and Left Sides have started of breakdown with red irritated spots and some scabs formed possibly from sutures. RN aware and is addressing it with cleaning and covering until MD can inspect it tomorrow.

## 2015-01-28 LAB — GLUCOSE, CAPILLARY
Glucose-Capillary: 111 mg/dL — ABNORMAL HIGH (ref 65–99)
Glucose-Capillary: 130 mg/dL — ABNORMAL HIGH (ref 65–99)
Glucose-Capillary: 140 mg/dL — ABNORMAL HIGH (ref 65–99)
Glucose-Capillary: 119 mg/dL — ABNORMAL HIGH (ref 65–99)

## 2015-01-28 MED ORDER — FREE WATER
200.0000 mL | Freq: Three times a day (TID) | Status: DC
  Administered 2015-01-28 – 2015-02-13 (×46): 200 mL

## 2015-01-28 MED ORDER — QUETIAPINE FUMARATE 200 MG PO TABS
200.0000 mg | ORAL_TABLET | Freq: Two times a day (BID) | ORAL | Status: DC
  Administered 2015-01-28 – 2015-01-29 (×3): 200 mg
  Filled 2015-01-28 (×5): qty 1

## 2015-01-28 MED ORDER — PROPRANOLOL HCL 20 MG/5ML PO SOLN
40.0000 mg | Freq: Four times a day (QID) | ORAL | Status: DC
  Administered 2015-01-28 – 2015-01-29 (×4): 40 mg
  Filled 2015-01-28 (×8): qty 10

## 2015-01-28 MED ORDER — FUROSEMIDE 10 MG/ML PO SOLN
80.0000 mg | Freq: Two times a day (BID) | ORAL | Status: AC
  Administered 2015-01-28 – 2015-01-29 (×3): 80 mg
  Filled 2015-01-28 (×4): qty 8
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
## 2015-01-29 LAB — CULTURE, BLOOD (ROUTINE X 2)
Culture: NO GROWTH
Culture: NO GROWTH

## 2015-01-29 LAB — GLUCOSE, CAPILLARY
Glucose-Capillary: 110 mg/dL — ABNORMAL HIGH (ref 65–99)
Glucose-Capillary: 129 mg/dL — ABNORMAL HIGH (ref 65–99)
Glucose-Capillary: 136 mg/dL — ABNORMAL HIGH (ref 65–99)
Glucose-Capillary: 126 mg/dL — ABNORMAL HIGH (ref 65–99)
Glucose-Capillary: 125 mg/dL — ABNORMAL HIGH (ref 65–99)
Glucose-Capillary: 127 mg/dL — ABNORMAL HIGH (ref 65–99)

## 2015-01-29 LAB — CULTURE, RESPIRATORY

## 2015-01-29 LAB — CULTURE, RESPIRATORY W GRAM STAIN: Special Requests: NORMAL

## 2015-01-29 MED ORDER — PROPRANOLOL HCL 20 MG/5ML PO SOLN
60.0000 mg | Freq: Three times a day (TID) | ORAL | Status: DC
  Administered 2015-01-29 (×2): 60 mg
  Filled 2015-01-29 (×5): qty 15

## 2015-01-29 MED ORDER — POTASSIUM CHLORIDE 20 MEQ/15ML (10%) PO SOLN
40.0000 meq | Freq: Two times a day (BID) | ORAL | Status: AC
  Administered 2015-01-29 (×2): 40 meq via ORAL
  Filled 2015-01-29 (×3): qty 30

## 2015-01-29 MED ORDER — SODIUM CHLORIDE 0.9 % IV SOLN
3.0000 g | Freq: Four times a day (QID) | INTRAVENOUS | Status: AC
  Administered 2015-01-29 – 2015-02-05 (×28): 3 g via INTRAVENOUS
  Filled 2015-01-29 (×34): qty 3

## 2015-01-29 MED ORDER — METOPROLOL TARTRATE 1 MG/ML IV SOLN
5.0000 mg | INTRAVENOUS | Status: DC | PRN
  Administered 2015-01-29 – 2015-01-31 (×4): 5 mg via INTRAVENOUS
  Filled 2015-01-29 (×4): qty 5

## 2015-01-29 MED ORDER — PROPRANOLOL HCL 20 MG/5ML PO SOLN
40.0000 mg | Freq: Four times a day (QID) | ORAL | Status: DC
  Filled 2015-01-29 (×4): qty 10

## 2015-01-29 MED ORDER — QUETIAPINE FUMARATE 50 MG PO TABS
100.0000 mg | ORAL_TABLET | Freq: Three times a day (TID) | ORAL | Status: DC
  Administered 2015-01-29 – 2015-02-04 (×18): 100 mg
  Filled 2015-01-29 (×2): qty 1
  Filled 2015-01-29 (×5): qty 2
  Filled 2015-01-29: qty 1
  Filled 2015-01-29: qty 2
  Filled 2015-01-29 (×2): qty 1
  Filled 2015-01-29: qty 2
  Filled 2015-01-29 (×5): qty 1
  Filled 2015-01-29 (×2): qty 2
  Filled 2015-01-29: qty 1

## 2015-01-29 MED ORDER — HYDROCODONE-ACETAMINOPHEN 7.5-325 MG/15ML PO SOLN
15.0000 mL | ORAL | Status: DC | PRN
  Administered 2015-01-29 – 2015-02-13 (×18): 15 mL
  Filled 2015-01-29 (×18): qty 15
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
## 2015-01-29 NOTE — Progress Notes (Signed)
Patient ID: Juan [name], male   DOB: 10/31/1990, 24 y.o.   MRN: [id number] I met with his mother at the bedside and updated her on his progress. Georganna Skeans, MD, MPH, FACS Trauma: [phone] General Surgery: [phone]
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
## 2015-01-29 NOTE — Progress Notes (Signed)
Patient ID: Juan [name], male   DOB: 09-19-1990, 24 y.o.   MRN: [id number] Noted Acinetobacter on resp CX. Unasyn ordered by CCM - appreciated. Violeta Gelinas, MD, MPH, FACS Trauma: [phone] General Surgery: [phone]
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
## 2015-01-29 NOTE — Clinical Social Work Note (Signed)
Clinical Social Worker continuing to follow patient and family for support and discharge planning needs.  Patient has transitioned to 28% trach collar and is working with therapies.  CSW remains available for support and to assist with discharge planning once appropriate disposition in place.  Macario Golds, [place] [phone]
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
## 2015-01-30 LAB — CBC
HCT: 34.6 % — ABNORMAL LOW (ref 39.0–52.0)
Hemoglobin: 11 g/dL — ABNORMAL LOW (ref 13.0–17.0)
MCH: 29.6 pg (ref 26.0–34.0)
MCHC: 31.8 g/dL (ref 30.0–36.0)
MCV: 93.3 fL (ref 78.0–100.0)
Platelets: 445 10*3/uL — ABNORMAL HIGH (ref 150–400)
RBC: 3.71 MIL/uL — ABNORMAL LOW (ref 4.22–5.81)
RDW: 14.5 % (ref 11.5–15.5)
WBC: 10.1 10*3/uL (ref 4.0–10.5)

## 2015-01-30 LAB — GLUCOSE, CAPILLARY
Glucose-Capillary: 118 mg/dL — ABNORMAL HIGH (ref 65–99)
Glucose-Capillary: 123 mg/dL — ABNORMAL HIGH (ref 65–99)
Glucose-Capillary: 127 mg/dL — ABNORMAL HIGH (ref 65–99)
Glucose-Capillary: 127 mg/dL — ABNORMAL HIGH (ref 65–99)
Glucose-Capillary: 129 mg/dL — ABNORMAL HIGH (ref 65–99)
Glucose-Capillary: 138 mg/dL — ABNORMAL HIGH (ref 65–99)

## 2015-01-30 MED ORDER — PROPRANOLOL HCL 20 MG/5ML PO SOLN
80.0000 mg | Freq: Three times a day (TID) | ORAL | Status: DC
  Administered 2015-01-30 (×3): 80 mg
  Filled 2015-01-30 (×6): qty 20

## 2015-01-30 MED ORDER — FENTANYL CITRATE (PF) 100 MCG/2ML IJ SOLN
25.0000 ug | INTRAMUSCULAR | Status: DC | PRN
  Administered 2015-01-30 – 2015-02-01 (×8): 50 ug via INTRAVENOUS
  Administered 2015-02-03 – 2015-02-06 (×2): 25 ug via INTRAVENOUS
  Filled 2015-01-30 (×12): qty 2

## 2015-01-30 NOTE — Procedures (Signed)
Pt transported by RT, RN, and NT to 3S08 without complications.

## 2015-01-30 NOTE — Progress Notes (Signed)
Pt weaned to trach collar at 28%.  Increased neuro storming last evening, per nursing report; MD increased Inderal and Bromocriptine today.  TBI team continues to work with pt.  Continue IV Unasyn for pneumonia.  Supportive family and girlfriend at bedside daily.    Will follow progress.  Quintella Baton, RN, BSN  Trauma/Neuro ICU Case Manager [phone]
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
## 2015-01-31 ENCOUNTER — Inpatient Hospital Stay (HOSPITAL_COMMUNITY): Payer: 59

## 2015-01-31 LAB — URINE MICROSCOPIC-ADD ON

## 2015-01-31 LAB — CBC WITH DIFFERENTIAL/PLATELET
Basophils Absolute: 0.1 10*3/uL (ref 0.0–0.1)
Basophils Relative: 1 %
Eosinophils Absolute: 0.2 10*3/uL (ref 0.0–0.7)
Eosinophils Relative: 1 %
HCT: 37.6 % — ABNORMAL LOW (ref 39.0–52.0)
Hemoglobin: 12.1 g/dL — ABNORMAL LOW (ref 13.0–17.0)
Lymphocytes Relative: 25 %
Lymphs Abs: 2.6 10*3/uL (ref 0.7–4.0)
MCH: 30.1 pg (ref 26.0–34.0)
MCHC: 32.2 g/dL (ref 30.0–36.0)
MCV: 93.5 fL (ref 78.0–100.0)
Monocytes Absolute: 0.6 10*3/uL (ref 0.1–1.0)
Monocytes Relative: 5 %
Neutro Abs: 7.1 10*3/uL (ref 1.7–7.7)
Neutrophils Relative %: 68 %
Platelets: 423 10*3/uL — ABNORMAL HIGH (ref 150–400)
RBC: 4.02 MIL/uL — ABNORMAL LOW (ref 4.22–5.81)
RDW: 14.3 % (ref 11.5–15.5)
WBC: 10.5 10*3/uL (ref 4.0–10.5)

## 2015-01-31 LAB — URINALYSIS, ROUTINE W REFLEX MICROSCOPIC
Bilirubin Urine: NEGATIVE
Glucose, UA: NEGATIVE mg/dL
Hgb urine dipstick: NEGATIVE
Ketones, ur: NEGATIVE mg/dL
Leukocytes, UA: NEGATIVE
Nitrite: NEGATIVE
Protein, ur: NEGATIVE mg/dL
Specific Gravity, Urine: 1.025 (ref 1.005–1.030)
Urobilinogen, UA: 1 mg/dL (ref 0.0–1.0)
pH: 8.5 — ABNORMAL HIGH (ref 5.0–8.0)

## 2015-01-31 LAB — GLUCOSE, CAPILLARY
Glucose-Capillary: 105 mg/dL — ABNORMAL HIGH (ref 65–99)
Glucose-Capillary: 134 mg/dL — ABNORMAL HIGH (ref 65–99)
Glucose-Capillary: 123 mg/dL — ABNORMAL HIGH (ref 65–99)
Glucose-Capillary: 137 mg/dL — ABNORMAL HIGH (ref 65–99)
Glucose-Capillary: 119 mg/dL — ABNORMAL HIGH (ref 65–99)
Glucose-Capillary: 92 mg/dL (ref 65–99)

## 2015-01-31 LAB — BASIC METABOLIC PANEL
Anion gap: 9 (ref 5–15)
BUN: 33 mg/dL — ABNORMAL HIGH (ref 6–20)
CO2: 30 mmol/L (ref 22–32)
Calcium: 9.8 mg/dL (ref 8.9–10.3)
Chloride: 104 mmol/L (ref 101–111)
Creatinine, Ser: 0.78 mg/dL (ref 0.61–1.24)
GFR calc Af Amer: >60 mL/min (ref >60)
GFR calc non Af Amer: >60 mL/min (ref >60)
Glucose, Bld: 130 mg/dL — ABNORMAL HIGH (ref 65–99)
Potassium: 4.1 mmol/L (ref 3.5–5.1)
Sodium: 143 mmol/L (ref 135–145)

## 2015-01-31 MED ORDER — PROPRANOLOL HCL 20 MG/5ML PO SOLN
100.0000 mg | Freq: Three times a day (TID) | ORAL | Status: DC
  Administered 2015-01-31 – 2015-02-13 (×36): 100 mg
  Filled 2015-01-31 (×46): qty 25
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
## 2015-01-31 NOTE — Progress Notes (Signed)
Pt's mother interested in possible [facility] Re[facility] referral in [place], if [facility] IP rehab not a possibility.   [facility] may be able to accept a more lower level pt than our rehab.  Dr. Lindie Spruce agrees with referral to [facility].   Left message for admissions office at [facility] [phone]).    Will fax clinical information/referral upon hearing back from admissions office.  Quintella Baton, RN, BSN  Trauma/Neuro ICU Case Manager [phone]

## 2015-01-31 NOTE — Progress Notes (Signed)
I met with pt's Mom at bedside. She is overwhelmed and tired. Encouraged her to rest as much as possible. She would like information concerning possibility of rehab at [facility] if pt unable to reach a level to be appropriate to admit here at [place]. I will discuss with RN [phone]
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
## 2015-01-31 NOTE — Progress Notes (Signed)
RT placed PT on new 28% ATC- uneventful. 
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
## 2015-02-01 LAB — GLUCOSE, CAPILLARY
Glucose-Capillary: 115 mg/dL — ABNORMAL HIGH (ref 65–99)
Glucose-Capillary: 118 mg/dL — ABNORMAL HIGH (ref 65–99)
Glucose-Capillary: 120 mg/dL — ABNORMAL HIGH (ref 65–99)
Glucose-Capillary: 120 mg/dL — ABNORMAL HIGH (ref 65–99)
Glucose-Capillary: 120 mg/dL — ABNORMAL HIGH (ref 65–99)
Glucose-Capillary: 130 mg/dL — ABNORMAL HIGH (ref 65–99)

## 2015-02-01 MED ORDER — IPRATROPIUM-ALBUTEROL 0.5-2.5 (3) MG/3ML IN SOLN
3.0000 mL | Freq: Four times a day (QID) | RESPIRATORY_TRACT | Status: DC
  Administered 2015-02-02 – 2015-02-10 (×33): 3 mL via RESPIRATORY_TRACT
  Filled 2015-02-01 (×33): qty 3
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
## 2015-02-01 NOTE — Progress Notes (Signed)
PAtient transferred from 3S to 5C-10. Safety precautions and orders reviewed with family. Patient appears in no distress at this time. TELE applied and confirmed. Will continue to monitor.   Sim Boast, RN

## 2015-02-01 NOTE — Consult Note (Signed)
ANTIBIOTIC CONSULT NOTE   Pharmacy Consult for unasyn Indication: pneumonia  No Known Allergies  Labs:  Recent Labs  01/30/15 0515 01/31/15 0349 01/31/15 1027  WBC 10.1  --  10.5  HGB 11.0*  --  12.1*  PLT 445*  --  423*  CREATININE  --  0.78  --    Estimated Creatinine Clearance: 129.6 mL/min (by C-G formula based on Cr of 0.78). No results for input(s): VANCOTROUGH, VANCOPEAK, VANCORANDOM, GENTTROUGH, GENTPEAK, GENTRANDOM, TOBRATROUGH, TOBRAPEAK, TOBRARND, AMIKACINPEAK, AMIKACINTROU, AMIKACIN in the last 72 hours.   Microbiology: Recent Results (from the past 720 hour(s))  MRSA PCR Screening     Status: None   Collection Time: 01/07/15  8:58 PM  Result Value Ref Range Status   MRSA by PCR NEGATIVE NEGATIVE Final    Comment:        The GeneXpert MRSA Assay (FDA approved for NASAL specimens only), is one component of a comprehensive MRSA colonization surveillance program. It is not intended to diagnose MRSA infection nor to guide or monitor treatment for MRSA infections.   Culture, respiratory (NON-Expectorated)     Status: None   Collection Time: 01/10/15  9:43 AM  Result Value Ref Range Status   Specimen Description TRACHEAL ASPIRATE  Final   Special Requests Normal  Final   Gram Stain   Final    RARE WBC PRESENT,BOTH PMN AND MONONUCLEAR NO SQUAMOUS EPITHELIAL CELLS SEEN NO ORGANISMS SEEN Performed at [facility]    Culture   Final    NO GROWTH 2 DAYS Performed at [facility]    Report Status 01/13/2015 FINAL  Final  Urine culture     Status: None   Collection Time: 01/16/15  9:22 PM  Result Value Ref Range Status   Specimen Description URINE, CATHETERIZED  Final   Special Requests NONE  Final   Culture NO GROWTH 1 DAY  Final   Report Status 01/17/2015 FINAL  Final  Culture, blood (routine x 2)     Status: None   Collection Time: 01/16/15  9:50 PM  Result Value Ref Range Status   Specimen Description BLOOD LEFT ARM  Final   Special  Requests IN PEDIATRIC BOTTLE 4CC  Final   Culture  Setup Time   Final    GRAM POSITIVE COCCI IN CLUSTERS AEROBIC BOTTLE ONLY CRITICAL RESULT CALLED TO, READ BACK BY AND VERIFIED WITH: Pilar Plate RN 1610 01/17/15 A BROWNING    Culture STAPHYLOCOCCUS SPECIES (COAGULASE NEGATIVE)  Final   Report Status 01/19/2015 FINAL  Final   Organism ID, Bacteria STAPHYLOCOCCUS SPECIES (COAGULASE NEGATIVE)  Final      Susceptibility   Staphylococcus species (coagulase negative) - MIC*    CIPROFLOXACIN >=8 RESISTANT Resistant     ERYTHROMYCIN >=8 RESISTANT Resistant     GENTAMICIN 8 INTERMEDIATE Intermediate     OXACILLIN >=4 RESISTANT Resistant     TETRACYCLINE 2 SENSITIVE Sensitive     VANCOMYCIN 1 SENSITIVE Sensitive     TRIMETH/SULFA 160 RESISTANT Resistant     CLINDAMYCIN >=8 RESISTANT Resistant     RIFAMPIN <=0.5 SENSITIVE Sensitive     Inducible Clindamycin NEGATIVE Sensitive     * STAPHYLOCOCCUS SPECIES (COAGULASE NEGATIVE)  Culture, blood (routine x 2)     Status: None   Collection Time: 01/16/15 10:00 PM  Result Value Ref Range Status   Specimen Description BLOOD RIGHT HAND  Final   Special Requests BOTTLES DRAWN AEROBIC AND ANAEROBIC 10CC  Final   Culture  Setup  Time   Final    GRAM POSITIVE COCCI IN CLUSTERS IN BOTH AEROBIC AND ANAEROBIC BOTTLES CRITICAL RESULT CALLED TO, READ BACK BY AND VERIFIED WITH: C DUPONT,RN AT 1606 01/18/15 BY L BENFIELD    Culture   Final    STAPHYLOCOCCUS SPECIES (COAGULASE NEGATIVE) SUSCEPTIBILITIES PERFORMED ON PREVIOUS CULTURE WITHIN THE LAST 5 DAYS.    Report Status 01/20/2015 FINAL  Final  Culture, blood (routine x 2)     Status: None   Collection Time: 01/24/15 10:15 AM  Result Value Ref Range Status   Specimen Description BLOOD RIGHT ANTECUBITAL  Final   Special Requests   Final    BOTTLES DRAWN AEROBIC AND ANAEROBIC BLUE 10CC RED 8CC   Culture NO GROWTH 5 DAYS  Final   Report Status 01/29/2015 FINAL  Final  Culture, blood (routine x 2)     Status:  None   Collection Time: 01/24/15 10:25 AM  Result Value Ref Range Status   Specimen Description BLOOD LEFT ANTECUBITAL  Final   Special Requests BOTTLES DRAWN AEROBIC AND ANAEROBIC 5CC 5CC  Final   Culture NO GROWTH 5 DAYS  Final   Report Status 01/29/2015 FINAL  Final  Culture, respiratory (NON-Expectorated)     Status: None   Collection Time: 01/26/15 12:03 PM  Result Value Ref Range Status   Specimen Description TRACHEAL ASPIRATE  Final   Special Requests Normal  Final   Gram Stain   Final    RARE WBC PRESENT, PREDOMINANTLY MONONUCLEAR RARE SQUAMOUS EPITHELIAL CELLS PRESENT FEW GRAM NEGATIVE RODS RARE GRAM POSITIVE RODS Performed at [facility]    Culture   Final    FEW ACINETOBACTER CALCOACETICUS/BAUMANNII COMPLEX Performed at [facility]    Report Status 01/29/2015 FINAL  Final   Organism ID, Bacteria ACINETOBACTER CALCOACETICUS/BAUMANNII COMPLEX  Final      Susceptibility   Acinetobacter calcoaceticus/baumannii complex - MIC*    AMPICILLIN >=32 RESISTANT Resistant     AMPICILLIN/SULBACTAM <=2 SENSITIVE Sensitive     CEFAZOLIN >=64 RESISTANT Resistant     CEFTAZIDIME 4 SENSITIVE Sensitive     CEFTRIAXONE 16 INTERMEDIATE Intermediate     CIPROFLOXACIN <=0.25 SENSITIVE Sensitive     GENTAMICIN <=1 SENSITIVE Sensitive     IMIPENEM <=0.25 SENSITIVE Sensitive     PIP/TAZO <=4 SENSITIVE Sensitive     TOBRAMYCIN <=1 SENSITIVE Sensitive     TRIMETH/SULFA <=20 SENSITIVE Sensitive     * FEW ACINETOBACTER CALCOACETICUS/BAUMANNII COMPLEX    Medical History: Past Medical History  Diagnosis Date  . Open fracture of right tibia 01/06/2015  . Open fracture of shaft of left tibia, type III 01/06/2015  . Closed left subtrochanteric femur fracture 01/06/2015   Assessment: 24 year old male continues on Unasyn for acinetobacter pneumonia Scr stable Day 3 of 7  Plan:  Continue Unasyn 3 gm IV q6h F/U LOT  Thank you Okey Regal, PharmD [phone] 02/01/2015 10:49  AM

## 2015-02-01 NOTE — Progress Notes (Signed)
I was called to pt's room by pt's grandmother. I met with pt's Mom and Dad concerning questions they had after Mom spoke with Legrand Como PA and Dr. Grandville Silos today. She feels she has been presented with very different goals and expectations for moving forward from the PA and MD today. She perceives that Legrand Como has told her that she needs to be prepared to take pt home next week, for [facility] only offers family education at this level of rehab recovery, not rehab for the pt himself. I clarified for her that [facility] rehab does offer lower level rehab recovery which included family education and caregiver support but that any rehab facility will have set limits on inpt admission length of stays. Pt not at a level for inpt rehab at [facility] yet, therefore I can not offer pt a bed at this time. She then prefers inpt rehab at [facility] if accepted and if SNF rehab is sought, she prefers that SNF to be closer [place]. I contacted RN CM to discuss and did discuss by phone with SW. I will continue to follow. [phone]

## 2015-02-01 NOTE — Progress Notes (Signed)
Report called to [facility], RN on [facility]. Patient to be transferred to 5C10 on tele by Jennette Kettle, RN, Westly Pam, RN and [place], [place]. Patient's mother at bedside, belongings sent with patient.

## 2015-02-01 NOTE — Clinical Social Work Note (Signed)
Clinical Social Worker continuing to follow patient and family for support and discharge planning needs.  Patient continues to progress slowly.  CM made referral to [facility] rehab, however family is not prepared to bring patient home quickly.  CSW to further explore the option of SNF placement with patient family.  CSW remains available for support and to facilitate patient discharge needs once medically stable.  Macario Golds, [place] [phone]

## 2015-02-01 NOTE — Progress Notes (Signed)
Pt came from 3S. Rt attached pt to wall o2 at 28% 5L. Pt sat 100%. Tolerated move well.

## 2015-02-01 NOTE — Progress Notes (Signed)
ATC setup changed 

## 2015-02-01 NOTE — Progress Notes (Signed)
Faxed clinical information to [facility] rehab admissions at [phone].  Will await callback from admissions liaison regarding possibility of admission.    Quintella Baton, RN, BSN  Trauma/Neuro ICU Case Manager [phone]

## 2015-02-02 LAB — GLUCOSE, CAPILLARY
Glucose-Capillary: 113 mg/dL — ABNORMAL HIGH (ref 65–99)
Glucose-Capillary: 116 mg/dL — ABNORMAL HIGH (ref 65–99)
Glucose-Capillary: 118 mg/dL — ABNORMAL HIGH (ref 65–99)
Glucose-Capillary: 118 mg/dL — ABNORMAL HIGH (ref 65–99)
Glucose-Capillary: 119 mg/dL — ABNORMAL HIGH (ref 65–99)
Glucose-Capillary: 134 mg/dL — ABNORMAL HIGH (ref 65–99)
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
## 2015-02-02 NOTE — Progress Notes (Signed)
Met with pt's mother and father to discuss dc plans.  Spoke with [facility] rehab today, and they are interested in pt; want to know who will be caring for pt upon discharge home from rehab.  Pt's mother states she will do whatever is necessary to take care of her son at dc.  Uncertain if pt will dc to his home he shares with girlfriend, or to mother's home at this time.  She states pt's father and grandmother will also be available to assist at dc.    Mom would like for referral to also be made to [facility] in [place], as she has heard good things about this facility.  They are not interested in the [facility] in [place], due to it being too far away from family.  Left message for [facility] regarding pt referral.  Will fax clinical information/make referral, per family request.    Mom and Dad confirm that they pt will have 24h care at dc provided by family members.    Reinaldo Raddle, RN, BSN  Trauma/Neuro ICU Case Manager [phone]
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
## 2015-02-02 NOTE — Clinical Social Work Note (Signed)
Clinical Social Worker continuing to follow patient and family for support and discharge planning needs.  CM met with patient mother and father at bedside to further discuss patient discharge options specific to inpatient rehab.  Patient family requesting referrals initiated to [facility] and [facility] to initiate.  Patient family committed to providing for patient at discharge as needed.  CSW remains available for support to patient family as needed while exploring inpatient rehab options.  Juan [name], [place]
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
## 2015-02-03 LAB — GLUCOSE, CAPILLARY
Glucose-Capillary: 109 mg/dL — ABNORMAL HIGH (ref 65–99)
Glucose-Capillary: 109 mg/dL — ABNORMAL HIGH (ref 65–99)
Glucose-Capillary: 113 mg/dL — ABNORMAL HIGH (ref 65–99)
Glucose-Capillary: 118 mg/dL — ABNORMAL HIGH (ref 65–99)
Glucose-Capillary: 87 mg/dL (ref 65–99)
Glucose-Capillary: 97 mg/dL (ref 65–99)
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
## 2015-02-04 LAB — GLUCOSE, CAPILLARY
Glucose-Capillary: 104 mg/dL — ABNORMAL HIGH (ref 65–99)
Glucose-Capillary: 105 mg/dL — ABNORMAL HIGH (ref 65–99)
Glucose-Capillary: 108 mg/dL — ABNORMAL HIGH (ref 65–99)
Glucose-Capillary: 123 mg/dL — ABNORMAL HIGH (ref 65–99)
Glucose-Capillary: 129 mg/dL — ABNORMAL HIGH (ref 65–99)
Glucose-Capillary: 90 mg/dL (ref 65–99)

## 2015-02-04 MED ORDER — CLONAZEPAM 1 MG PO TABS
2.0000 mg | ORAL_TABLET | Freq: Three times a day (TID) | ORAL | Status: DC
  Administered 2015-02-04 – 2015-02-07 (×10): 2 mg
  Filled 2015-02-04 (×10): qty 2

## 2015-02-04 MED ORDER — CLONAZEPAM 1 MG PO TABS: 2.0000 mg | ORAL_TABLET | Freq: Three times a day (TID) | ORAL | Status: DC

## 2015-02-04 MED ORDER — SODIUM CHLORIDE 0.9 % IV BOLUS (SEPSIS)
500.0000 mL | Freq: Once | INTRAVENOUS | Status: AC
  Administered 2015-02-04: 500 mL via INTRAVENOUS

## 2015-02-04 MED ORDER — QUETIAPINE FUMARATE 50 MG PO TABS
200.0000 mg | ORAL_TABLET | Freq: Two times a day (BID) | ORAL | Status: DC
  Administered 2015-02-04 – 2015-02-05 (×3): 200 mg
  Filled 2015-02-04 (×3): qty 4
  Filled 2015-02-04: qty 8

## 2015-02-04 MED ORDER — CLONAZEPAM 1 MG PO TABS: 3.0000 mg | ORAL_TABLET | Freq: Three times a day (TID) | ORAL | Status: DC

## 2015-02-04 NOTE — Consult Note (Signed)
ANTIBIOTIC CONSULT NOTE   Pharmacy Consult for unasyn Indication: pneumonia  No Known Allergies  Labs: No results for input(s): WBC, HGB, PLT, LABCREA, CREATININE in the last 72 hours. Estimated Creatinine Clearance: 129.6 mL/min (by C-G formula based on Cr of 0.78). No results for input(s): VANCOTROUGH, VANCOPEAK, VANCORANDOM, GENTTROUGH, GENTPEAK, GENTRANDOM, TOBRATROUGH, TOBRAPEAK, TOBRARND, AMIKACINPEAK, AMIKACINTROU, AMIKACIN in the last 72 hours.   Microbiology: Recent Results (from the past 720 hour(s))  MRSA PCR Screening     Status: None   Collection Time: 01/07/15  8:58 PM  Result Value Ref Range Status   MRSA by PCR NEGATIVE NEGATIVE Final    Comment:        The GeneXpert MRSA Assay (FDA approved for NASAL specimens only), is one component of a comprehensive MRSA colonization surveillance program. It is not intended to diagnose MRSA infection nor to guide or monitor treatment for MRSA infections.   Culture, respiratory (NON-Expectorated)     Status: None   Collection Time: 01/10/15  9:43 AM  Result Value Ref Range Status   Specimen Description TRACHEAL ASPIRATE  Final   Special Requests Normal  Final   Gram Stain   Final    RARE WBC PRESENT,BOTH PMN AND MONONUCLEAR NO SQUAMOUS EPITHELIAL CELLS SEEN NO ORGANISMS SEEN Performed at [facility]    Culture   Final    NO GROWTH 2 DAYS Performed at [facility]    Report Status 01/13/2015 FINAL  Final  Urine culture     Status: None   Collection Time: 01/16/15  9:22 PM  Result Value Ref Range Status   Specimen Description URINE, CATHETERIZED  Final   Special Requests NONE  Final   Culture NO GROWTH 1 DAY  Final   Report Status 01/17/2015 FINAL  Final  Culture, blood (routine x 2)     Status: None   Collection Time: 01/16/15  9:50 PM  Result Value Ref Range Status   Specimen Description BLOOD LEFT ARM  Final   Special Requests IN PEDIATRIC BOTTLE 4CC  Final   Culture  Setup Time   Final   GRAM POSITIVE COCCI IN CLUSTERS AEROBIC BOTTLE ONLY CRITICAL RESULT CALLED TO, READ BACK BY AND VERIFIED WITH: Pilar Plate RN 1610 01/17/15 A BROWNING    Culture STAPHYLOCOCCUS SPECIES (COAGULASE NEGATIVE)  Final   Report Status 01/19/2015 FINAL  Final   Organism ID, Bacteria STAPHYLOCOCCUS SPECIES (COAGULASE NEGATIVE)  Final      Susceptibility   Staphylococcus species (coagulase negative) - MIC*    CIPROFLOXACIN >=8 RESISTANT Resistant     ERYTHROMYCIN >=8 RESISTANT Resistant     GENTAMICIN 8 INTERMEDIATE Intermediate     OXACILLIN >=4 RESISTANT Resistant     TETRACYCLINE 2 SENSITIVE Sensitive     VANCOMYCIN 1 SENSITIVE Sensitive     TRIMETH/SULFA 160 RESISTANT Resistant     CLINDAMYCIN >=8 RESISTANT Resistant     RIFAMPIN <=0.5 SENSITIVE Sensitive     Inducible Clindamycin NEGATIVE Sensitive     * STAPHYLOCOCCUS SPECIES (COAGULASE NEGATIVE)  Culture, blood (routine x 2)     Status: None   Collection Time: 01/16/15 10:00 PM  Result Value Ref Range Status   Specimen Description BLOOD RIGHT HAND  Final   Special Requests BOTTLES DRAWN AEROBIC AND ANAEROBIC 10CC  Final   Culture  Setup Time   Final    GRAM POSITIVE COCCI IN CLUSTERS IN BOTH AEROBIC AND ANAEROBIC BOTTLES CRITICAL RESULT CALLED TO, READ BACK BY AND VERIFIED WITH: C DUPONT,RN  AT 1606 01/18/15 BY L BENFIELD    Culture   Final    STAPHYLOCOCCUS SPECIES (COAGULASE NEGATIVE) SUSCEPTIBILITIES PERFORMED ON PREVIOUS CULTURE WITHIN THE LAST 5 DAYS.    Report Status 01/20/2015 FINAL  Final  Culture, blood (routine x 2)     Status: None   Collection Time: 01/24/15 10:15 AM  Result Value Ref Range Status   Specimen Description BLOOD RIGHT ANTECUBITAL  Final   Special Requests   Final    BOTTLES DRAWN AEROBIC AND ANAEROBIC BLUE 10CC RED 8CC   Culture NO GROWTH 5 DAYS  Final   Report Status 01/29/2015 FINAL  Final  Culture, blood (routine x 2)     Status: None   Collection Time: 01/24/15 10:25 AM  Result Value Ref Range Status    Specimen Description BLOOD LEFT ANTECUBITAL  Final   Special Requests BOTTLES DRAWN AEROBIC AND ANAEROBIC 5CC 5CC  Final   Culture NO GROWTH 5 DAYS  Final   Report Status 01/29/2015 FINAL  Final  Culture, respiratory (NON-Expectorated)     Status: None   Collection Time: 01/26/15 12:03 PM  Result Value Ref Range Status   Specimen Description TRACHEAL ASPIRATE  Final   Special Requests Normal  Final   Gram Stain   Final    RARE WBC PRESENT, PREDOMINANTLY MONONUCLEAR RARE SQUAMOUS EPITHELIAL CELLS PRESENT FEW GRAM NEGATIVE RODS RARE GRAM POSITIVE RODS Performed at [facility]    Culture   Final    FEW ACINETOBACTER CALCOACETICUS/BAUMANNII COMPLEX Performed at [facility]    Report Status 01/29/2015 FINAL  Final   Organism ID, Bacteria ACINETOBACTER CALCOACETICUS/BAUMANNII COMPLEX  Final      Susceptibility   Acinetobacter calcoaceticus/baumannii complex - MIC*    AMPICILLIN >=32 RESISTANT Resistant     AMPICILLIN/SULBACTAM <=2 SENSITIVE Sensitive     CEFAZOLIN >=64 RESISTANT Resistant     CEFTAZIDIME 4 SENSITIVE Sensitive     CEFTRIAXONE 16 INTERMEDIATE Intermediate     CIPROFLOXACIN <=0.25 SENSITIVE Sensitive     GENTAMICIN <=1 SENSITIVE Sensitive     IMIPENEM <=0.25 SENSITIVE Sensitive     PIP/TAZO <=4 SENSITIVE Sensitive     TOBRAMYCIN <=1 SENSITIVE Sensitive     TRIMETH/SULFA <=20 SENSITIVE Sensitive     * FEW ACINETOBACTER CALCOACETICUS/BAUMANNII COMPLEX  Culture, blood (routine x 2)     Status: None (Preliminary result)   Collection Time: 01/31/15 10:30 AM  Result Value Ref Range Status   Specimen Description BLOOD RIGHT ANTECUBITAL  Final   Special Requests BOTTLES DRAWN AEROBIC ONLY 10CC  Final   Culture NO GROWTH 3 DAYS  Final   Report Status PENDING  Incomplete  Culture, blood (routine x 2)     Status: None (Preliminary result)   Collection Time: 01/31/15 10:30 AM  Result Value Ref Range Status   Specimen Description BLOOD LEFT ANTECUBITAL   Final   Special Requests BOTTLES DRAWN AEROBIC ONLY 5CC  Final   Culture NO GROWTH 3 DAYS  Final   Report Status PENDING  Incomplete    Medical History: Past Medical History  Diagnosis Date  . Open fracture of right tibia 01/06/2015  . Open fracture of shaft of left tibia, type III 01/06/2015  . Closed left subtrochanteric femur fracture 01/06/2015   Assessment: 24 year old male continues on Unasyn for acinetobacter pneumonia Scr stable Day 6 of 7  Plan:  Continue Unasyn 3 gm IV q6h F/U LOT  Celedonio Miyamoto, PharmD, BCPS-AQ ID Clinical Pharmacist Pager [phone]  02/04/2015 1:03 PM
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
## 2015-02-04 NOTE — Progress Notes (Signed)
RT called to room after pt started vomiting. Pt was on 28% ATC Sp02 87-88%, increased 02 to to 35%. RN suctioning yellow sputum from trach.Tube feeds were held at this time. MD was notified. . Pt is stable at this time. RN at bedside.

## 2015-02-05 LAB — CULTURE, BLOOD (ROUTINE X 2)
Culture: NO GROWTH
Culture: NO GROWTH

## 2015-02-05 LAB — GLUCOSE, CAPILLARY
Glucose-Capillary: 100 mg/dL — ABNORMAL HIGH (ref 65–99)
Glucose-Capillary: 101 mg/dL — ABNORMAL HIGH (ref 65–99)
Glucose-Capillary: 102 mg/dL — ABNORMAL HIGH (ref 65–99)
Glucose-Capillary: 117 mg/dL — ABNORMAL HIGH (ref 65–99)
Glucose-Capillary: 91 mg/dL (ref 65–99)
Glucose-Capillary: 93 mg/dL (ref 65–99)

## 2015-02-05 NOTE — Clinical Social Work Note (Signed)
Clinical Social Worker continuing to follow patient and family for support and discharge planning needs. CM working with patient family regarding inpatient rehab placement. Patient family requesting referrals initiated to [facility] and [facility] - CM to initiate. Patient family committed to providing for patient at discharge as needed. CSW remains available for support to patient family as needed while exploring inpatient rehab options.  Macario Golds, [place] [phone]

## 2015-02-05 NOTE — Progress Notes (Signed)
Faxed clinical information to [facility] [place] [facility].  Phone:  [phone], Fax: [phone].  Quintella Baton, RN, BSN  Trauma/Neuro ICU Case Manager [phone]
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
## 2015-02-06 LAB — GLUCOSE, CAPILLARY
Glucose-Capillary: 104 mg/dL — ABNORMAL HIGH (ref 65–99)
Glucose-Capillary: 105 mg/dL — ABNORMAL HIGH (ref 65–99)
Glucose-Capillary: 107 mg/dL — ABNORMAL HIGH (ref 65–99)
Glucose-Capillary: 108 mg/dL — ABNORMAL HIGH (ref 65–99)
Glucose-Capillary: 110 mg/dL — ABNORMAL HIGH (ref 65–99)
Glucose-Capillary: 110 mg/dL — ABNORMAL HIGH (ref 65–99)

## 2015-02-06 LAB — MRSA PCR SCREENING: MRSA by PCR: NEGATIVE

## 2015-02-06 MED ORDER — QUETIAPINE FUMARATE 50 MG PO TABS
100.0000 mg | ORAL_TABLET | Freq: Two times a day (BID) | ORAL | Status: DC
  Administered 2015-02-06 – 2015-02-07 (×4): 100 mg
  Filled 2015-02-06 (×4): qty 2

## 2015-02-06 NOTE — Progress Notes (Addendum)
Received call from [facility]; accepting MD at facility does not want to accept pt until he is off NWB restrictions, which is about another 4 weeks.  They recommend pt go to SNF until then, and they would consider admission at that time.   I spoke with family earlier this afternoon, and they are interested in looking into rehab at [facility] (50 minutes from mom's home).  Attempted to speak with someone this afternoon to refer pt, but had to leave voice mail.  Will follow up first thing in am.    Will follow up with MD/family.  Quintella Baton, RN, BSN  Trauma/Neuro ICU Case Manager [phone]

## 2015-02-06 NOTE — Progress Notes (Signed)
Orthopedic Tech Progress Note Patient Details:  Juan [name] 11-06-1990 [id number] Delivered abdominal binder to pt.'s nurse. Patient ID: [name], male   DOB: June 19, 1990, 24 y.o.   MRN: [id number]   [name] 02/06/2015, 6:03 PM
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
## 2015-02-06 NOTE — Progress Notes (Addendum)
Updated clinical information sent to [facility] in [place], per facility's request.  Pt ready for dc possibly tomorrow, per MD.    Quintella Baton, RN, BSN  Trauma/Neuro ICU Case Manager [phone]
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
## 2015-02-07 ENCOUNTER — Inpatient Hospital Stay (HOSPITAL_COMMUNITY): Payer: 59

## 2015-02-07 LAB — GLUCOSE, CAPILLARY
Glucose-Capillary: 109 mg/dL — ABNORMAL HIGH (ref 65–99)
Glucose-Capillary: 112 mg/dL — ABNORMAL HIGH (ref 65–99)
Glucose-Capillary: 116 mg/dL — ABNORMAL HIGH (ref 65–99)
Glucose-Capillary: 119 mg/dL — ABNORMAL HIGH (ref 65–99)
Glucose-Capillary: 127 mg/dL — ABNORMAL HIGH (ref 65–99)
Glucose-Capillary: 133 mg/dL — ABNORMAL HIGH (ref 65–99)

## 2015-02-07 MED ORDER — BACLOFEN 1 MG/ML ORAL SUSPENSION: 5.0000 mg | Freq: Three times a day (TID) | ORAL | Status: DC

## 2015-02-07 MED ORDER — CLONAZEPAM 1 MG PO TABS
1.0000 mg | ORAL_TABLET | Freq: Three times a day (TID) | ORAL | Status: DC
Start: 2015-02-07 — End: 2015-02-08
  Administered 2015-02-07 – 2015-02-08 (×3): 1 mg
  Filled 2015-02-07 (×3): qty 1

## 2015-02-07 MED ORDER — BACLOFEN 10 MG PO TABS
10.0000 mg | ORAL_TABLET | Freq: Three times a day (TID) | ORAL | Status: DC
  Administered 2015-02-07 – 2015-02-13 (×19): 10 mg via ORAL
  Filled 2015-02-07 (×19): qty 1
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
## 2015-02-07 NOTE — Progress Notes (Signed)
Met with pt's mother and stepfather to discuss dc planning.  They are aware of [facility] denial of admission.  I spoke with [facility], per their request, and they will not be able to admit pt, as they go by the same guidelines as [facility].  Pt must be able to tolerate 3-4 hours of therapy a day, and frankly, he is unable to do that.  They did state that if his brain function improves, and he becomes able to tolerate more therapy, they would definitely be interested.    Mom and stepfather disappointed, but understanding of decision.  They feel that they need some time to get their home ready for pt, as it will need some remodeling in order to make it handicapped accessible.  They would be agreeable to SNF in the [place] area, as mom states it is the largest city nearest to them, and she has heard "good things" about medical facilities in this area.  They are interested in [facility], and perhaps with progress, pt may be able to transition from SNF to Rehab in [place].    Will consult CSW to discuss with parents; facilitate dc to SNF when medically stable.  Will follow progress.    Reinaldo Raddle, RN, BSN  Trauma/Neuro ICU Case Manager [phone]
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
## 2015-02-07 NOTE — Progress Notes (Signed)
Patient's manual BP 108/40, patient diaphoretic, now sleeping. MD notified. No new orders. Will continue to monitor.
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
## 2015-02-07 NOTE — Clinical Social Work Note (Signed)
CSW received consult for patient needing SNF placement.  Patient's family would like CSW to look for placement in the [place], [place] area do to close proximity of patient's family member.  Patient's family was given list of SNFs in [place] area and patient was faxed out to SNFs.  Formal assessment to follow and FL2 to be completed for SNF placement options.  CSW to continue to follow patient's progress towards discharge planning.  Ervin Knack. Anterhaus, MSW, [employer] [phone]
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
## 2015-02-08 LAB — GLUCOSE, CAPILLARY
Glucose-Capillary: 104 mg/dL — ABNORMAL HIGH (ref 65–99)
Glucose-Capillary: 113 mg/dL — ABNORMAL HIGH (ref 65–99)
Glucose-Capillary: 120 mg/dL — ABNORMAL HIGH (ref 65–99)
Glucose-Capillary: 126 mg/dL — ABNORMAL HIGH (ref 65–99)
Glucose-Capillary: 132 mg/dL — ABNORMAL HIGH (ref 65–99)
Glucose-Capillary: 98 mg/dL (ref 65–99)

## 2015-02-08 MED ORDER — QUETIAPINE FUMARATE 50 MG PO TABS
50.0000 mg | ORAL_TABLET | Freq: Three times a day (TID) | ORAL | Status: DC
  Administered 2015-02-08 – 2015-02-13 (×16): 50 mg
  Filled 2015-02-08 (×16): qty 1

## 2015-02-08 MED ORDER — CLONAZEPAM 1 MG PO TABS
1.0000 mg | ORAL_TABLET | Freq: Two times a day (BID) | ORAL | Status: DC
Start: 2015-02-08 — End: 2015-02-09
  Administered 2015-02-08 – 2015-02-09 (×2): 1 mg
  Filled 2015-02-08 (×2): qty 1

## 2015-02-08 MED ORDER — FENTANYL 75 MCG/HR TD PT72
75.0000 ug | MEDICATED_PATCH | TRANSDERMAL | Status: DC
  Administered 2015-02-10 – 2015-02-13 (×2): 75 ug via TRANSDERMAL
  Filled 2015-02-08 (×2): qty 1
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
## 2015-02-08 NOTE — Clinical Social Work Note (Signed)
Clinical Social Worker continuing to follow patient and family for support and to assist with discharge planning needs.  Patient has made steady progress with therapies and is now potentially eligible for [facility] inpatient rehab.  CSW spoke with inpatient rehab admissions coordinator who states that she will pursue insurance authorization and has communicated extensively with patient family regarding inpatient rehab admission.  Potential bed availability for next week.  CSW remains available for support as needed.  Macario Golds, [place] [phone]
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
## 2015-02-08 NOTE — Care Management Note (Signed)
Case Management Note  Patient Details  Name: Juan [name] MRN: [id number] Date of Birth: 1990-11-21  Subjective/Objective:    Pt more alert and more able to participate in therapies.                 Action/Plan: CIR liaison working with rehab MD and insurance company for possible approval to [facility] next week.  Mother and stepdad agreeable to this plan.  Will continue to follow progress.    Expected Discharge Date:                  Expected Discharge Plan:  IP Rehab Facility  In-House Referral:  Clinical Social Work  Discharge planning Services  CM Consult  Post Acute Care Choice:    Choice offered to:     DME Arranged:    DME Agency:     HH Arranged:    HH Agency:     Status of Service:  In process, will continue to follow  Medicare Important Message Given:    Date Medicare IM Given:    Medicare IM give by:    Date Additional Medicare IM Given:    Additional Medicare Important Message give by:     If discussed at Long Length of Stay Meetings, dates discussed:    Additional Comments:  Quintella Baton, RN, BSN  Trauma/Neuro ICU Case Manager [phone]

## 2015-02-08 NOTE — Progress Notes (Signed)
I met with therapy team today at 1 pm to observe pt progress and ability to participate in therapies now that pt is less sedated for sessions. I have discussed case with Dr. Naaman Plummer and will now pursue a possible inpt rehab admission her at [facility] with his [facility]. Pt's Mom and Step Dad are aware and in agreement to plan. I have notified RN CM and SW. I await insurance approval and bed availability hopefully by early next week. Please call me with any questions. [phone]

## 2015-02-09 DIAGNOSIS — T791XXS Fat embolism (traumatic), sequela: Secondary | ICD-10-CM

## 2015-02-09 LAB — GLUCOSE, CAPILLARY
Glucose-Capillary: 107 mg/dL — ABNORMAL HIGH (ref 65–99)
Glucose-Capillary: 110 mg/dL — ABNORMAL HIGH (ref 65–99)
Glucose-Capillary: 118 mg/dL — ABNORMAL HIGH (ref 65–99)
Glucose-Capillary: 119 mg/dL — ABNORMAL HIGH (ref 65–99)
Glucose-Capillary: 121 mg/dL — ABNORMAL HIGH (ref 65–99)
Glucose-Capillary: 126 mg/dL — ABNORMAL HIGH (ref 65–99)
Glucose-Capillary: 99 mg/dL (ref 65–99)

## 2015-02-09 MED ORDER — BENZTROPINE MESYLATE 0.5 MG PO TABS
1.0000 mg | ORAL_TABLET | Freq: Two times a day (BID) | ORAL | Status: DC
  Administered 2015-02-09 – 2015-02-12 (×7): 1 mg via ORAL
  Filled 2015-02-09 (×8): qty 2

## 2015-02-09 MED ORDER — BENZTROPINE MESYLATE 1 MG/ML IJ SOLN
0.5000 mg | Freq: Two times a day (BID) | INTRAMUSCULAR | Status: DC
  Administered 2015-02-12 – 2015-02-13 (×2): 0.5 mg via INTRAMUSCULAR
  Filled 2015-02-09 (×10): qty 0.5

## 2015-02-09 MED ORDER — CLONAZEPAM 1 MG PO TABS
1.5000 mg | ORAL_TABLET | Freq: Two times a day (BID) | ORAL | Status: DC
  Administered 2015-02-09 – 2015-02-13 (×8): 1.5 mg
  Filled 2015-02-09 (×16): qty 1
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
## 2015-02-09 NOTE — Progress Notes (Signed)
I am working on Biochemist, clinical for a possible inpt rehab admission early next week. I met with pt's Mom and Step Dad at bedside this morning. Reassurance given as pertaining to pt's restlessness and active movement now that his sedation has been decreased. [phone]

## 2015-02-10 LAB — GLUCOSE, CAPILLARY
Glucose-Capillary: 100 mg/dL — ABNORMAL HIGH (ref 65–99)
Glucose-Capillary: 115 mg/dL — ABNORMAL HIGH (ref 65–99)
Glucose-Capillary: 103 mg/dL — ABNORMAL HIGH (ref 65–99)

## 2015-02-10 MED ORDER — IPRATROPIUM-ALBUTEROL 0.5-2.5 (3) MG/3ML IN SOLN: 3.0000 mL | RESPIRATORY_TRACT | Status: DC | PRN
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
## 2015-02-10 NOTE — Progress Notes (Signed)
RN changed trach dressing, patient began vomiting. Trach suctioned, oral suctioning performed. Cannula changed, gauze changed, skin under trach cleaned, dried, protective foam dressing placed. Gastric residuals for patient have been between 20-30 this shift. MD notified. Per MD, hold feeding for 6 hours, then restart at rate of 65 mL/hr. Will continue to monitor patient.

## 2015-02-11 DIAGNOSIS — R259 Unspecified abnormal involuntary movements: Secondary | ICD-10-CM

## 2015-02-11 LAB — GLUCOSE, CAPILLARY
Glucose-Capillary: 102 mg/dL — ABNORMAL HIGH (ref 65–99)
Glucose-Capillary: 113 mg/dL — ABNORMAL HIGH (ref 65–99)
Glucose-Capillary: 123 mg/dL — ABNORMAL HIGH (ref 65–99)

## 2015-02-11 NOTE — Progress Notes (Signed)
Patient with urinary output of 300cc over eight hours. Urine amber, clear, no odor. Slight bladder distention noted on visual exam. Bladder scan showed . MD notified. Per MD, insert foley for acute retention. Foley inserted, patient tolerated well. 400cc urine drained. Will continue to monitor closely.

## 2015-02-11 NOTE — Progress Notes (Signed)
Subjective: Per report of family and nursing patient has been resting better and not as much movement during the day.  Currently resting.    Objective: Current vital signs: BP 112/66 mmHg  Pulse 88  Temp(Src) 96.8 F (36 C) (Axillary)  Resp 20  Ht  (1.676 m)  Wt 71.668 kg (158 lb)  BMI 25.51 kg/m2  SpO2 97% Vital signs in last 24 hours: Temp:  [96.8 F (36 C)-99.6 F (37.6 C)] 96.8 F (36 C) (09/25 1007) Pulse Rate:  [77-101] 88 (09/25 1157) Resp:  [16-20] 20 (09/25 1157) BP: (105-140)/(60-87) 112/66 mmHg (09/25 1157) SpO2:  [97 %-100 %] 97 % (09/25 1157) FiO2 (%):  [28 %] 28 % (09/25 1157) Weight:  [71.668 kg (158 lb)] 71.668 kg (158 lb) (09/25 4098)  Intake/Output from previous day: 09/24 0701 - 09/25 0700 In: 275 [NG/GT:200] Out: 1000 [Urine:1000] Intake/Output this shift:   Nutritional status:    Neurologic Exam: No involuntary movement while resting.  Patient not awakened at family request.    Lab Results: Basic Metabolic Panel: No results for input(s): NA, K, CL, CO2, GLUCOSE, BUN, CREATININE, CALCIUM, MG, PHOS in the last 168 hours.  Liver Function Tests: No results for input(s): AST, ALT, ALKPHOS, BILITOT, PROT, ALBUMIN in the last 168 hours. No results for input(s): LIPASE, AMYLASE in the last 168 hours. No results for input(s): AMMONIA in the last 168 hours.  CBC: No results for input(s): WBC, NEUTROABS, HGB, HCT, MCV, PLT in the last 168 hours.  Cardiac Enzymes: No results for input(s): CKTOTAL, CKMB, CKMBINDEX, TROPONINI in the last 168 hours.  Lipid Panel: No results for input(s): CHOL, TRIG, HDL, CHOLHDL, VLDL, LDLCALC in the last 168 hours.  CBG:  Recent Labs Lab 02/10/15 0402 02/10/15 0746 02/10/15 1638 02/11/15 0024 02/11/15 0603  GLUCAP 100* 115* 103* 102* 113*    Microbiology: Results for orders placed or performed during the hospital encounter of 01/06/15  MRSA PCR Screening     Status: None   Collection Time: 01/07/15   8:58 PM  Result Value Ref Range Status   MRSA by PCR NEGATIVE NEGATIVE Final    Comment:        The GeneXpert MRSA Assay (FDA approved for NASAL specimens only), is one component of a comprehensive MRSA colonization surveillance program. It is not intended to diagnose MRSA infection nor to guide or monitor treatment for MRSA infections.   Culture, respiratory (NON-Expectorated)     Status: None   Collection Time: 01/10/15  9:43 AM  Result Value Ref Range Status   Specimen Description TRACHEAL ASPIRATE  Final   Special Requests Normal  Final   Gram Stain   Final    RARE WBC PRESENT,BOTH PMN AND MONONUCLEAR NO SQUAMOUS EPITHELIAL CELLS SEEN NO ORGANISMS SEEN Performed at [facility]    Culture   Final    NO GROWTH 2 DAYS Performed at [facility]    Report Status 01/13/2015 FINAL  Final  Urine culture     Status: None   Collection Time: 01/16/15  9:22 PM  Result Value Ref Range Status   Specimen Description URINE, CATHETERIZED  Final   Special Requests NONE  Final   Culture NO GROWTH 1 DAY  Final   Report Status 01/17/2015 FINAL  Final  Culture, blood (routine x 2)     Status: None   Collection Time: 01/16/15  9:50 PM  Result Value Ref Range Status   Specimen Description BLOOD LEFT ARM  Final  Special Requests IN PEDIATRIC BOTTLE 4CC  Final   Culture  Setup Time   Final    GRAM POSITIVE COCCI IN CLUSTERS AEROBIC BOTTLE ONLY CRITICAL RESULT CALLED TO, READ BACK BY AND VERIFIED WITH: Pilar Plate RN 1610 01/17/15 A BROWNING    Culture STAPHYLOCOCCUS SPECIES (COAGULASE NEGATIVE)  Final   Report Status 01/19/2015 FINAL  Final   Organism ID, Bacteria STAPHYLOCOCCUS SPECIES (COAGULASE NEGATIVE)  Final      Susceptibility   Staphylococcus species (coagulase negative) - MIC*    CIPROFLOXACIN >=8 RESISTANT Resistant     ERYTHROMYCIN >=8 RESISTANT Resistant     GENTAMICIN 8 INTERMEDIATE Intermediate     OXACILLIN >=4 RESISTANT Resistant     TETRACYCLINE 2  SENSITIVE Sensitive     VANCOMYCIN 1 SENSITIVE Sensitive     TRIMETH/SULFA 160 RESISTANT Resistant     CLINDAMYCIN >=8 RESISTANT Resistant     RIFAMPIN <=0.5 SENSITIVE Sensitive     Inducible Clindamycin NEGATIVE Sensitive     * STAPHYLOCOCCUS SPECIES (COAGULASE NEGATIVE)  Culture, blood (routine x 2)     Status: None   Collection Time: 01/16/15 10:00 PM  Result Value Ref Range Status   Specimen Description BLOOD RIGHT HAND  Final   Special Requests BOTTLES DRAWN AEROBIC AND ANAEROBIC 10CC  Final   Culture  Setup Time   Final    GRAM POSITIVE COCCI IN CLUSTERS IN BOTH AEROBIC AND ANAEROBIC BOTTLES CRITICAL RESULT CALLED TO, READ BACK BY AND VERIFIED WITH: C DUPONT,RN AT 1606 01/18/15 BY L BENFIELD    Culture   Final    STAPHYLOCOCCUS SPECIES (COAGULASE NEGATIVE) SUSCEPTIBILITIES PERFORMED ON PREVIOUS CULTURE WITHIN THE LAST 5 DAYS.    Report Status 01/20/2015 FINAL  Final  Culture, blood (routine x 2)     Status: None   Collection Time: 01/24/15 10:15 AM  Result Value Ref Range Status   Specimen Description BLOOD RIGHT ANTECUBITAL  Final   Special Requests   Final    BOTTLES DRAWN AEROBIC AND ANAEROBIC BLUE 10CC RED 8CC   Culture NO GROWTH 5 DAYS  Final   Report Status 01/29/2015 FINAL  Final  Culture, blood (routine x 2)     Status: None   Collection Time: 01/24/15 10:25 AM  Result Value Ref Range Status   Specimen Description BLOOD LEFT ANTECUBITAL  Final   Special Requests BOTTLES DRAWN AEROBIC AND ANAEROBIC 5CC 5CC  Final   Culture NO GROWTH 5 DAYS  Final   Report Status 01/29/2015 FINAL  Final  Culture, respiratory (NON-Expectorated)     Status: None   Collection Time: 01/26/15 12:03 PM  Result Value Ref Range Status   Specimen Description TRACHEAL ASPIRATE  Final   Special Requests Normal  Final   Gram Stain   Final    RARE WBC PRESENT, PREDOMINANTLY MONONUCLEAR RARE SQUAMOUS EPITHELIAL CELLS PRESENT FEW GRAM NEGATIVE RODS RARE GRAM POSITIVE RODS Performed at  [facility]    Culture   Final    FEW ACINETOBACTER CALCOACETICUS/BAUMANNII COMPLEX Performed at [facility]    Report Status 01/29/2015 FINAL  Final   Organism ID, Bacteria ACINETOBACTER CALCOACETICUS/BAUMANNII COMPLEX  Final      Susceptibility   Acinetobacter calcoaceticus/baumannii complex - MIC*    AMPICILLIN >=32 RESISTANT Resistant     AMPICILLIN/SULBACTAM <=2 SENSITIVE Sensitive     CEFAZOLIN >=64 RESISTANT Resistant     CEFTAZIDIME 4 SENSITIVE Sensitive     CEFTRIAXONE 16 INTERMEDIATE Intermediate     CIPROFLOXACIN <=0.25 SENSITIVE  Sensitive     GENTAMICIN <=1 SENSITIVE Sensitive     IMIPENEM <=0.25 SENSITIVE Sensitive     PIP/TAZO <=4 SENSITIVE Sensitive     TOBRAMYCIN <=1 SENSITIVE Sensitive     TRIMETH/SULFA <=20 SENSITIVE Sensitive     * FEW ACINETOBACTER CALCOACETICUS/BAUMANNII COMPLEX  Culture, blood (routine x 2)     Status: None   Collection Time: 01/31/15 10:30 AM  Result Value Ref Range Status   Specimen Description BLOOD RIGHT ANTECUBITAL  Final   Special Requests BOTTLES DRAWN AEROBIC ONLY 10CC  Final   Culture NO GROWTH 5 DAYS  Final   Report Status 02/05/2015 FINAL  Final  Culture, blood (routine x 2)     Status: None   Collection Time: 01/31/15 10:30 AM  Result Value Ref Range Status   Specimen Description BLOOD LEFT ANTECUBITAL  Final   Special Requests BOTTLES DRAWN AEROBIC ONLY 5CC  Final   Culture NO GROWTH 5 DAYS  Final   Report Status 02/05/2015 FINAL  Final  MRSA PCR Screening     Status: None   Collection Time: 02/06/15 12:54 PM  Result Value Ref Range Status   MRSA by PCR NEGATIVE NEGATIVE Final    Comment:        The GeneXpert MRSA Assay (FDA approved for NASAL specimens only), is one component of a comprehensive MRSA colonization surveillance program. It is not intended to diagnose MRSA infection nor to guide or monitor treatment for MRSA infections.     Coagulation Studies: No results for input(s): LABPROT,  INR in the last 72 hours.  Imaging: No results found.  Medications:  I have reviewed the patient's current medications. Scheduled: . antiseptic oral rinse  7 mL Mouth Rinse QID  . baclofen  10 mg Oral TID  . benztropine  1 mg Oral BID   Or  . benztropine mesylate  0.5 mg Intramuscular BID  . bromocriptine  2.5 mg Per Tube TID  . chlorhexidine gluconate  15 mL Mouth Rinse BID  . clonazePAM  1.5 mg Per Tube BID  . enoxaparin (LOVENOX) injection  30 mg Subcutaneous Q12H  . fentaNYL  75 mcg Transdermal Q72H  . free water  200 mL Per Tube 3 times per day  . lacosamide (VIMPAT) IV  200 mg Intravenous Q12H  . levETIRAcetam  1,500 mg Per Tube BID  . neomycin-bacitracin-polymyxin   Topical BID  . pantoprazole sodium  40 mg Per Tube Daily  . polyethylene glycol  17 g Per Tube Daily  . propranolol  100 mg Per Tube TID  . QUEtiapine  50 mg Per Tube TID    Assessment/Plan: Continue current regimen.  Will continue to check in to follow response to medications.     LOS: 36 days   Thana Farr, MD [facility] [phone] 02/11/2015  1:16 PM
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
## 2015-02-12 LAB — GLUCOSE, CAPILLARY
Glucose-Capillary: 105 mg/dL — ABNORMAL HIGH (ref 65–99)
Glucose-Capillary: 110 mg/dL — ABNORMAL HIGH (ref 65–99)
Glucose-Capillary: 112 mg/dL — ABNORMAL HIGH (ref 65–99)
Glucose-Capillary: 118 mg/dL — ABNORMAL HIGH (ref 65–99)

## 2015-02-12 NOTE — Progress Notes (Signed)
Subjective: Family reports some improvement in movements following benztropine addition.   Exam: Filed Vitals:   02/12/15 0856  BP:   Pulse: 84  Temp:   Resp: 18   Gen: In bed, tracheostomy.  Resp: non-labored breathing, no acute distress Abd: soft, nt  Neuro: MS: eyes open, does not follow commands(thoguh does sometimes per family).  ZO:XWRUEAV and tracks, seems to have a right preference.  Motor: increased tone in the left arm but does move all extremities spontaneously.  Sensory:responds to nox stim x 4.    Impression: 24 yo M with frequent restless movements in the setting of anti-psychotic use. 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 Could consider replacing keppra with gabapentin as this can be useful for restless legs to see if it provides some benefit.   Recommendations: 1) Continue current regimen for now.  2) will continue to follow.   Ritta Slot, MD [facility] [phone]  If 7pm- 7am, please page neurology on call as listed in [facility].
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
## 2015-02-12 NOTE — Progress Notes (Signed)
Pt has been approved by his insurance to admit to inpt rehab. I will make arrangements to admit tomorrow. RNCM and SW are aware. I met with pt's Mom and girlfriend to go over details and to make the arrangements. [phone]
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
## 2015-02-12 NOTE — Progress Notes (Signed)
Anticipating dc to [facility] tomorrow 9/27, pending insurance authorization.    Quintella Baton, RN, BSN  Trauma/Neuro ICU Case Manager [phone]

## 2015-02-12 NOTE — Progress Notes (Signed)
[facility]  CONSULT SERVICE NOTE  Pt with improved participation in therapies associated with increased arousal and attention. He exhibits, however, substantial deficits in awareness, attention, processing. I believe he has progressed enough to participate in our inpatient program, perhaps initially on a 2+ hour per day basis, ramping up to 3-4 hours per day as his tolerance improves. Rehab Admissions Coordinator to follow up.  Thanks,  Ranelle Oyster, MD, [place]
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
## 2015-02-12 NOTE — Clinical Social Work Note (Signed)
Clinical Social Worker continuing to follow patient and family for support and to assist with discharge planning needs. Patient has made steady progress with therapies and is now potentially eligible for [facility] inpatient rehab. CSW spoke with inpatient rehab admissions coordinator who states that she will pursue insurance authorization and has communicated extensively with patient family regarding inpatient rehab admission. Potential bed availability for this week. CSW remains available for support as needed.  Macario Golds, [place] [phone]

## 2015-02-12 NOTE — Progress Notes (Signed)
I am pursuing insurance approval to admit pt to inpt rehab tomorrow. [phone]

## 2015-02-13 ENCOUNTER — Encounter (HOSPITAL_COMMUNITY): Payer: Self-pay

## 2015-02-13 ENCOUNTER — Inpatient Hospital Stay (HOSPITAL_COMMUNITY)
Admission: RE | Admit: 2015-02-13 | Discharge: 2015-03-20 | DRG: 559 | Disposition: A | Discharge status: Home Health | Payer: 59 | Source: Intra-hospital | Attending: Physical Medicine & Rehabilitation | Admitting: Physical Medicine & Rehabilitation

## 2015-02-13 DIAGNOSIS — Z930 Tracheostomy status: Secondary | ICD-10-CM

## 2015-02-13 DIAGNOSIS — Z4659 Encounter for fitting and adjustment of other gastrointestinal appliance and device: Secondary | ICD-10-CM

## 2015-02-13 DIAGNOSIS — J930 Spontaneous tension pneumothorax: Secondary | ICD-10-CM

## 2015-02-13 DIAGNOSIS — S82401E Unspecified fracture of shaft of right fibula, subsequent encounter for open fracture type I or II with routine healing: Secondary | ICD-10-CM | POA: Diagnosis not present

## 2015-02-13 DIAGNOSIS — G40909 Epilepsy, unspecified, not intractable, without status epilepticus: Secondary | ICD-10-CM

## 2015-02-13 DIAGNOSIS — Z09 Encounter for follow-up examination after completed treatment for conditions other than malignant neoplasm: Secondary | ICD-10-CM

## 2015-02-13 DIAGNOSIS — T791XXD Fat embolism (traumatic), subsequent encounter: Secondary | ICD-10-CM

## 2015-02-13 DIAGNOSIS — T148XXA Other injury of unspecified body region, initial encounter: Secondary | ICD-10-CM

## 2015-02-13 DIAGNOSIS — R252 Cramp and spasm: Secondary | ICD-10-CM

## 2015-02-13 DIAGNOSIS — G259 Extrapyramidal and movement disorder, unspecified: Secondary | ICD-10-CM | POA: Diagnosis not present

## 2015-02-13 DIAGNOSIS — S72002D Fracture of unspecified part of neck of left femur, subsequent encounter for closed fracture with routine healing: Secondary | ICD-10-CM | POA: Diagnosis not present

## 2015-02-13 DIAGNOSIS — R1314 Dysphagia, pharyngoesophageal phase: Secondary | ICD-10-CM | POA: Diagnosis not present

## 2015-02-13 DIAGNOSIS — S2241XD Multiple fractures of ribs, right side, subsequent encounter for fracture with routine healing: Secondary | ICD-10-CM | POA: Diagnosis not present

## 2015-02-13 DIAGNOSIS — S3282XS Multiple fractures of pelvis without disruption of pelvic ring, sequela: Secondary | ICD-10-CM | POA: Diagnosis not present

## 2015-02-13 DIAGNOSIS — J189 Pneumonia, unspecified organism: Secondary | ICD-10-CM | POA: Diagnosis not present

## 2015-02-13 DIAGNOSIS — S32810S Multiple fractures of pelvis with stable disruption of pelvic ring, sequela: Secondary | ICD-10-CM

## 2015-02-13 DIAGNOSIS — N319 Neuromuscular dysfunction of bladder, unspecified: Secondary | ICD-10-CM

## 2015-02-13 DIAGNOSIS — S32019D Unspecified fracture of first lumbar vertebra, subsequent encounter for fracture with routine healing: Secondary | ICD-10-CM | POA: Diagnosis not present

## 2015-02-13 DIAGNOSIS — T791XXS Fat embolism (traumatic), sequela: Secondary | ICD-10-CM | POA: Diagnosis not present

## 2015-02-13 DIAGNOSIS — R339 Retention of urine, unspecified: Secondary | ICD-10-CM | POA: Diagnosis not present

## 2015-02-13 DIAGNOSIS — S3282XA Multiple fractures of pelvis without disruption of pelvic ring, initial encounter for closed fracture: Secondary | ICD-10-CM | POA: Diagnosis present

## 2015-02-13 DIAGNOSIS — R569 Unspecified convulsions: Secondary | ICD-10-CM

## 2015-02-13 DIAGNOSIS — S32402D Unspecified fracture of left acetabulum, subsequent encounter for fracture with routine healing: Secondary | ICD-10-CM

## 2015-02-13 DIAGNOSIS — S82202E Unspecified fracture of shaft of left tibia, subsequent encounter for open fracture type I or II with routine healing: Secondary | ICD-10-CM | POA: Diagnosis present

## 2015-02-13 DIAGNOSIS — Z931 Gastrostomy status: Secondary | ICD-10-CM | POA: Diagnosis not present

## 2015-02-13 DIAGNOSIS — S82402E Unspecified fracture of shaft of left fibula, subsequent encounter for open fracture type I or II with routine healing: Secondary | ICD-10-CM

## 2015-02-13 DIAGNOSIS — S3282XD Multiple fractures of pelvis without disruption of pelvic ring, subsequent encounter for fracture with routine healing: Secondary | ICD-10-CM

## 2015-02-13 DIAGNOSIS — S069X4A Unspecified intracranial injury with loss of consciousness of 6 hours to 24 hours, initial encounter: Secondary | ICD-10-CM

## 2015-02-13 DIAGNOSIS — R509 Fever, unspecified: Secondary | ICD-10-CM

## 2015-02-13 DIAGNOSIS — S069X4S Unspecified intracranial injury with loss of consciousness of 6 hours to 24 hours, sequela: Secondary | ICD-10-CM | POA: Diagnosis not present

## 2015-02-13 DIAGNOSIS — T791XXA Fat embolism (traumatic), initial encounter: Secondary | ICD-10-CM | POA: Diagnosis present

## 2015-02-13 DIAGNOSIS — S82201E Unspecified fracture of shaft of right tibia, subsequent encounter for open fracture type I or II with routine healing: Secondary | ICD-10-CM | POA: Diagnosis not present

## 2015-02-13 LAB — BASIC METABOLIC PANEL
Anion gap: 9 (ref 5–15)
BUN: 19 mg/dL (ref 6–20)
CO2: 27 mmol/L (ref 22–32)
Calcium: 9.9 mg/dL (ref 8.9–10.3)
Chloride: 104 mmol/L (ref 101–111)
Creatinine, Ser: 0.52 mg/dL — ABNORMAL LOW (ref 0.61–1.24)
GFR calc Af Amer: >60 mL/min (ref >60)
GFR calc non Af Amer: >60 mL/min (ref >60)
Glucose, Bld: 118 mg/dL — ABNORMAL HIGH (ref 65–99)
Potassium: 3.7 mmol/L (ref 3.5–5.1)
Sodium: 140 mmol/L (ref 135–145)

## 2015-02-13 LAB — CREATININE, SERUM
Creatinine, Ser: 0.66 mg/dL (ref 0.61–1.24)
GFR calc Af Amer: >60 mL/min (ref >60)
GFR calc non Af Amer: >60 mL/min (ref >60)

## 2015-02-13 LAB — CBC
HCT: 31.2 % — ABNORMAL LOW (ref 39.0–52.0)
Hemoglobin: 10.6 g/dL — ABNORMAL LOW (ref 13.0–17.0)
MCH: 30.3 pg (ref 26.0–34.0)
MCHC: 34 g/dL (ref 30.0–36.0)
MCV: 89.1 fL (ref 78.0–100.0)
Platelets: 255 10*3/uL (ref 150–400)
RBC: 3.5 MIL/uL — ABNORMAL LOW (ref 4.22–5.81)
RDW: 13.8 % (ref 11.5–15.5)
WBC: 7.4 10*3/uL (ref 4.0–10.5)

## 2015-02-13 LAB — GLUCOSE, CAPILLARY: Glucose-Capillary: 110 mg/dL — ABNORMAL HIGH (ref 65–99)

## 2015-02-13 MED ORDER — PROPRANOLOL HCL 20 MG/5ML PO SOLN
100.0000 mg | Freq: Three times a day (TID) | ORAL | Status: DC
  Administered 2015-02-13 – 2015-02-28 (×36): 100 mg
  Filled 2015-02-13 (×50): qty 25

## 2015-02-13 MED ORDER — BACLOFEN 10 MG PO TABS
10.0000 mg | ORAL_TABLET | Freq: Three times a day (TID) | ORAL | Status: DC
  Administered 2015-02-13 – 2015-02-15 (×8): 10 mg via ORAL
  Filled 2015-02-13 (×9): qty 1

## 2015-02-13 MED ORDER — FENTANYL 25 MCG/HR TD PT72
50.0000 ug | MEDICATED_PATCH | TRANSDERMAL | Status: DC
  Administered 2015-02-16: 50 ug via TRANSDERMAL
  Filled 2015-02-13 (×2): qty 2

## 2015-02-13 MED ORDER — LEVETIRACETAM 100 MG/ML PO SOLN
1500.0000 mg | Freq: Two times a day (BID) | ORAL | Status: DC
  Administered 2015-02-13 – 2015-02-28 (×30): 1500 mg
  Filled 2015-02-13 (×27): qty 15

## 2015-02-13 MED ORDER — BROMOCRIPTINE MESYLATE 2.5 MG PO TABS
2.5000 mg | ORAL_TABLET | Freq: Three times a day (TID) | ORAL | Status: DC
  Administered 2015-02-13 (×2): 2.5 mg
  Filled 2015-02-13 (×6): qty 1

## 2015-02-13 MED ORDER — BENZTROPINE MESYLATE 1 MG/ML IJ SOLN
0.5000 mg | Freq: Two times a day (BID) | INTRAMUSCULAR | Status: DC
  Filled 2015-02-13 (×4): qty 0.5

## 2015-02-13 MED ORDER — ENOXAPARIN SODIUM 30 MG/0.3ML ~~LOC~~ SOLN
30.0000 mg | Freq: Two times a day (BID) | SUBCUTANEOUS | Status: DC
  Administered 2015-02-13 – 2015-03-19 (×68): 30 mg via SUBCUTANEOUS
  Filled 2015-02-13 (×71): qty 0.3

## 2015-02-13 MED ORDER — CHLORHEXIDINE GLUCONATE 0.12% ORAL RINSE (MEDLINE KIT)
15.0000 mL | Freq: Two times a day (BID) | OROMUCOSAL | Status: DC
  Administered 2015-02-14 – 2015-02-27 (×24): 15 mL via OROMUCOSAL
  Filled 2015-02-13: qty 15

## 2015-02-13 MED ORDER — POLYETHYLENE GLYCOL 3350 17 G PO PACK
17.0000 g | PACK | Freq: Every day | ORAL | Status: DC
  Administered 2015-02-14 – 2015-02-21 (×4): 17 g
  Filled 2015-02-13 (×7): qty 1

## 2015-02-13 MED ORDER — HYDROCODONE-ACETAMINOPHEN 7.5-325 MG/15ML PO SOLN
15.0000 mL | ORAL | Status: DC | PRN
  Administered 2015-02-13 – 2015-02-18 (×5): 15 mL
  Filled 2015-02-13 (×5): qty 15

## 2015-02-13 MED ORDER — PIVOT 1.5 CAL PO LIQD
1000.0000 mL | ORAL | Status: DC
  Administered 2015-02-13: 1000 mL
  Filled 2015-02-13 (×4): qty 1000

## 2015-02-13 MED ORDER — FREE WATER
200.0000 mL | Freq: Three times a day (TID) | Status: DC
  Administered 2015-02-13 – 2015-02-20 (×19): 200 mL

## 2015-02-13 MED ORDER — IPRATROPIUM-ALBUTEROL 0.5-2.5 (3) MG/3ML IN SOLN: 3.0000 mL | RESPIRATORY_TRACT | Status: DC | PRN

## 2015-02-13 MED ORDER — ENOXAPARIN SODIUM 30 MG/0.3ML ~~LOC~~ SOLN: 30.0000 mg | Freq: Two times a day (BID) | SUBCUTANEOUS | Status: DC

## 2015-02-13 MED ORDER — ACETAMINOPHEN 160 MG/5ML PO SOLN
650.0000 mg | Freq: Four times a day (QID) | ORAL | Status: DC | PRN
Start: 2015-02-13 — End: 2015-02-14

## 2015-02-13 MED ORDER — BACITRACIN-NEOMYCIN-POLYMYXIN OINTMENT TUBE
TOPICAL_OINTMENT | Freq: Two times a day (BID) | CUTANEOUS | Status: DC
  Administered 2015-02-13 – 2015-02-14 (×2): via TOPICAL
  Administered 2015-02-15: 1 via TOPICAL
  Administered 2015-02-16 – 2015-02-18 (×2): via TOPICAL
  Administered 2015-02-19: 1 via TOPICAL
  Filled 2015-02-13: qty 15

## 2015-02-13 MED ORDER — QUETIAPINE FUMARATE 50 MG PO TABS
50.0000 mg | ORAL_TABLET | Freq: Three times a day (TID) | ORAL | Status: DC
  Administered 2015-02-13 (×2): 50 mg
  Filled 2015-02-13 (×2): qty 1

## 2015-02-13 MED ORDER — CLONAZEPAM 0.5 MG PO TABS
1.5000 mg | ORAL_TABLET | Freq: Two times a day (BID) | ORAL | Status: DC
  Administered 2015-02-13 – 2015-02-19 (×13): 1.5 mg
  Filled 2015-02-13 (×14): qty 3

## 2015-02-13 MED ORDER — SODIUM CHLORIDE 0.9 % IV SOLN
200.0000 mg | Freq: Two times a day (BID) | INTRAVENOUS | Status: DC
  Administered 2015-02-13 – 2015-02-19 (×13): 200 mg via INTRAVENOUS
  Filled 2015-02-13 (×28): qty 20

## 2015-02-13 MED ORDER — PANTOPRAZOLE SODIUM 40 MG PO PACK
40.0000 mg | PACK | Freq: Every day | ORAL | Status: DC
  Administered 2015-02-14 – 2015-02-28 (×15): 40 mg
  Filled 2015-02-13 (×16): qty 20

## 2015-02-13 MED ORDER — SORBITOL 70 % SOLN: 30.0000 mL | Freq: Every day | Status: DC | PRN

## 2015-02-13 MED ORDER — BENZTROPINE MESYLATE 1 MG PO TABS
1.0000 mg | ORAL_TABLET | Freq: Two times a day (BID) | ORAL | Status: DC
  Administered 2015-02-13: 1 mg via ORAL
  Filled 2015-02-13 (×4): qty 1

## 2015-02-13 MED ORDER — ANTISEPTIC ORAL RINSE SOLUTION (CORINZ)
7.0000 mL | Freq: Four times a day (QID) | OROMUCOSAL | Status: DC
  Administered 2015-02-13 – 2015-03-19 (×49): 7 mL via OROMUCOSAL

## 2015-02-13 NOTE — Interval H&P Note (Signed)
Juan [name] was admitted today to Inpatient Rehabilitation with the diagnosis of fat emboli syndrome.  The patient's history has been reviewed, patient examined, and there is no change in status.  Patient continues to be appropriate for intensive inpatient rehabilitation.  I have reviewed the patient's chart and labs.  Questions were answered to the patient's satisfaction. The PAPE has been reviewed and assessment remains appropriate.  SWARTZ,ZACHARY T 02/13/2015, 1:38 PM
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
## 2015-02-13 NOTE — Clinical Social Work Note (Signed)
Clinical Social Worker continuing to follow patient and family for support and to assist with discharge planning needs. Patient has made steady progress with therapies and now has a bed available at [facility] inpatient rehab. CSW spoke with inpatient rehab admissions coordinator who states that she has received insurance authorization and plans to admit patient today.  MD/PA aware.  Patient family agreeable with current plan for today.  Clinical Social Worker will sign off for now as social work intervention is no longer needed. Please consult [place] again if new need arises.  Macario Golds, [place] [phone]
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
## 2015-02-13 NOTE — Progress Notes (Signed)
  Pt with improved participation in therapies associated with increased arousal and attention. He exhibits, however, substantial deficits in awareness, attention, processing. I believe he has progressed enough to participate in our inpatient program, perhaps initially on a 2+ hour per day basis, ramping up to 3-4 hours per day as his tolerance improves. Rehab Admissions Coordinator to follow up.  Thanks,  Ranelle Oyster, MD, [place]

## 2015-02-13 NOTE — Progress Notes (Signed)
Report was given to [facility] RN

## 2015-02-13 NOTE — Progress Notes (Signed)
Subjective: Family continues to report benefit from Benztropine.   Exam: Filed Vitals:   02/13/15 0900  BP: 131/84  Pulse:   Temp:   Resp:     HEENT-  Normocephalic, no lesions, without obvious abnormality.  Normal external eye and conjunctiva.  Normal TM's bilaterally.  Normal auditory canals and external ears. Normal external nose, mucus membranes and septum.  Normal pharynx. Cardiovascular- S1, S2 normal, pulses palpable throughout   Lungs- chest clear, no wheezing, rales, normal symmetric air entry Abdomen- normal findings: bowel sounds normal     Neuro: MS: eyes open, does not follow commands(thoguh does sometimes per family).  ZO:[id number] and tracks, seems to have a right preference.  Motor: increased tone in the left arm but does move all extremities spontaneously.  Sensory:responds to nox stim x 4.     Felicie Morn PA-C [facility] [phone]  Impression: 24 yo M with frequent restless movements in the setting of anti-psychotic use.  Continues to show benefit from benztropine. Plan is to go to CIR today. Could consider replacing keppra with gabapentin as this can be useful for restless legs to see if it provides some benefit.    Recommendations: 1) No change recommended.  May consider getting off antipsychotics if not needed.   Neurology S/O  02/13/2015, 10:14 AM
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
## 2015-02-14 ENCOUNTER — Inpatient Hospital Stay (HOSPITAL_COMMUNITY): Payer: 59

## 2015-02-14 ENCOUNTER — Inpatient Hospital Stay (HOSPITAL_COMMUNITY): Payer: Self-pay | Admitting: Occupational Therapy

## 2015-02-14 ENCOUNTER — Inpatient Hospital Stay (HOSPITAL_COMMUNITY): Payer: PRIVATE HEALTH INSURANCE | Admitting: Occupational Therapy

## 2015-02-14 ENCOUNTER — Inpatient Hospital Stay (HOSPITAL_COMMUNITY): Payer: 59 | Admitting: Occupational Therapy

## 2015-02-14 LAB — COMPREHENSIVE METABOLIC PANEL
ALT: 30 U/L (ref 17–63)
AST: 30 U/L (ref 15–41)
Albumin: 3.5 g/dL (ref 3.5–5.0)
Alkaline Phosphatase: 208 U/L — ABNORMAL HIGH (ref 38–126)
Anion gap: 7 (ref 5–15)
BUN: 17 mg/dL (ref 6–20)
CO2: 29 mmol/L (ref 22–32)
Calcium: 9.9 mg/dL (ref 8.9–10.3)
Chloride: 101 mmol/L (ref 101–111)
Creatinine, Ser: 0.7 mg/dL (ref 0.61–1.24)
GFR calc Af Amer: >60 mL/min (ref >60)
GFR calc non Af Amer: >60 mL/min (ref >60)
Glucose, Bld: 121 mg/dL — ABNORMAL HIGH (ref 65–99)
Potassium: 4 mmol/L (ref 3.5–5.1)
Sodium: 137 mmol/L (ref 135–145)
Total Bilirubin: 0.7 mg/dL (ref 0.3–1.2)
Total Protein: 6.8 g/dL (ref 6.5–8.1)

## 2015-02-14 LAB — CBC WITH DIFFERENTIAL/PLATELET
Basophils Absolute: 0 10*3/uL (ref 0.0–0.1)
Basophils Relative: 1 %
Eosinophils Absolute: 0.2 10*3/uL (ref 0.0–0.7)
Eosinophils Relative: 2 %
HCT: 32.7 % — ABNORMAL LOW (ref 39.0–52.0)
Hemoglobin: 10.9 g/dL — ABNORMAL LOW (ref 13.0–17.0)
Lymphocytes Relative: 32 %
Lymphs Abs: 2.5 10*3/uL (ref 0.7–4.0)
MCH: 29.8 pg (ref 26.0–34.0)
MCHC: 33.3 g/dL (ref 30.0–36.0)
MCV: 89.3 fL (ref 78.0–100.0)
Monocytes Absolute: 0.9 10*3/uL (ref 0.1–1.0)
Monocytes Relative: 11 %
Neutro Abs: 4.2 10*3/uL (ref 1.7–7.7)
Neutrophils Relative %: 54 %
Platelets: 272 10*3/uL (ref 150–400)
RBC: 3.66 MIL/uL — ABNORMAL LOW (ref 4.22–5.81)
RDW: 13.6 % (ref 11.5–15.5)
WBC: 7.8 10*3/uL (ref 4.0–10.5)

## 2015-02-14 LAB — URINE MICROSCOPIC-ADD ON

## 2015-02-14 LAB — URINALYSIS, ROUTINE W REFLEX MICROSCOPIC
Bilirubin Urine: NEGATIVE
Glucose, UA: NEGATIVE mg/dL
Ketones, ur: NEGATIVE mg/dL
Leukocytes, UA: NEGATIVE
Nitrite: NEGATIVE
Protein, ur: NEGATIVE mg/dL
Specific Gravity, Urine: 1.021 (ref 1.005–1.030)
Urobilinogen, UA: 1 mg/dL (ref 0.0–1.0)
pH: 7 (ref 5.0–8.0)

## 2015-02-14 MED ORDER — LEVOFLOXACIN 25 MG/ML PO SOLN: 750.0000 mg | Freq: Every day | ORAL | Status: DC

## 2015-02-14 MED ORDER — QUETIAPINE FUMARATE 100 MG PO TABS: 100.0000 mg | ORAL_TABLET | Freq: Every day | ORAL | Status: DC

## 2015-02-14 MED ORDER — PIVOT 1.5 CAL PO LIQD
1000.0000 mL | ORAL | Status: DC
  Administered 2015-02-14 – 2015-02-20 (×6): 1000 mL
  Filled 2015-02-14 (×15): qty 1000

## 2015-02-14 MED ORDER — METHYLPHENIDATE HCL 5 MG PO TABS
5.0000 mg | ORAL_TABLET | Freq: Two times a day (BID) | ORAL | Status: DC
  Administered 2015-02-14 – 2015-02-16 (×4): 5 mg
  Filled 2015-02-14 (×4): qty 1

## 2015-02-14 MED ORDER — METHYLPHENIDATE HCL 5 MG PO TABS: 5.0000 mg | ORAL_TABLET | Freq: Two times a day (BID) | ORAL | Status: DC

## 2015-02-14 MED ORDER — LEVOFLOXACIN 500 MG PO TABS
750.0000 mg | ORAL_TABLET | Freq: Every day | ORAL | Status: DC
  Administered 2015-02-14 – 2015-02-21 (×8): 750 mg
  Filled 2015-02-14 (×8): qty 2

## 2015-02-14 MED ORDER — QUETIAPINE FUMARATE 100 MG PO TABS
100.0000 mg | ORAL_TABLET | Freq: Every day | ORAL | Status: DC
  Administered 2015-02-14 – 2015-02-22 (×9): 100 mg
  Filled 2015-02-14 (×9): qty 1

## 2015-02-14 MED ORDER — ACETAMINOPHEN 160 MG/5ML PO SOLN
650.0000 mg | ORAL | Status: DC | PRN
  Administered 2015-02-14: 650 mg
  Filled 2015-02-14: qty 20.3
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
## 2015-02-14 NOTE — Progress Notes (Signed)
Rt changed Trach from #6 cuffed to #4 cuffless. Pt has equal BS, vitals stable, ezcap good color change. Pt had adverse reaction to therapy.

## 2015-02-14 NOTE — Progress Notes (Signed)
Juan Thomas, NP ordered to give seroquel tonight since pt did not receive it this morning and Dr. Riley Kill note said to change to every night. Rudie MeyerJacalyn Lefevre, RN

## 2015-02-14 NOTE — Progress Notes (Signed)
Patient with fever T-101.5. No diaphoresis, chills or distress reported. Did have a foley yesterday at admission and strong concentrated urine reported. He is incontinent of bladder--likely overflowing as was cath for 600 cc in order to obtain urine specimen.  Will scan qid for now and cath for volumes > 500 cc. UA negative for WBC/leucocytes or nitrates. CXR with recurrent Left retrocardiac opacity. Will have radiology compare to prior films (not available due to chart merge) and start on patient on Levaquin due to concerns of aspiration PNA.
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
## 2015-02-14 NOTE — Progress Notes (Signed)
RT changed out humidifier bottle and tubing.
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
## 2015-02-14 NOTE — Progress Notes (Signed)
RN cleaned pts peri area tonight. Asked family if they wanted staff to remove pts shirt and clean underarms. Family refused and said that she had already cleaned him earlier today. Pts family prefers to not use the resting hand splints because they are messing up his IV access. They have placed wash cloths in pts hand, and performing ROM on hands routinely. Pt fell asleep around 2300 tonight after daily medications. Bladder scan volume of >556 and I&O cath for 700 ml. Patient remained asleep during the cath. Family refusing pain meds at this time. Will continue to monitor. Rudie Meyer, RN

## 2015-02-14 NOTE — Progress Notes (Signed)
Patient information reviewed and entered into eRehab system by [name] Noel, RN, CRRN, PPS Coordinator.  Information including medical coding and functional independence measure will be reviewed and updated through discharge.    
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
## 2015-02-15 ENCOUNTER — Inpatient Hospital Stay (HOSPITAL_COMMUNITY): Payer: 59 | Admitting: Physical Therapy

## 2015-02-15 ENCOUNTER — Inpatient Hospital Stay (HOSPITAL_COMMUNITY): Payer: 59 | Admitting: Speech Pathology

## 2015-02-15 ENCOUNTER — Inpatient Hospital Stay (HOSPITAL_COMMUNITY): Payer: Self-pay

## 2015-02-15 LAB — CBC WITH DIFFERENTIAL/PLATELET
Basophils Absolute: 0.1 10*3/uL (ref 0.0–0.1)
Basophils Relative: 1 %
Eosinophils Absolute: 0.1 10*3/uL (ref 0.0–0.7)
Eosinophils Relative: 2 %
HCT: 34.5 % — ABNORMAL LOW (ref 39.0–52.0)
Hemoglobin: 11.1 g/dL — ABNORMAL LOW (ref 13.0–17.0)
Lymphocytes Relative: 36 %
Lymphs Abs: 2.3 10*3/uL (ref 0.7–4.0)
MCH: 28.9 pg (ref 26.0–34.0)
MCHC: 32.2 g/dL (ref 30.0–36.0)
MCV: 89.8 fL (ref 78.0–100.0)
Monocytes Absolute: 0.8 10*3/uL (ref 0.1–1.0)
Monocytes Relative: 13 %
Neutro Abs: 3.1 10*3/uL (ref 1.7–7.7)
Neutrophils Relative %: 48 %
Platelets: 264 10*3/uL (ref 150–400)
RBC: 3.84 MIL/uL — ABNORMAL LOW (ref 4.22–5.81)
RDW: 13.9 % (ref 11.5–15.5)
WBC: 6.4 10*3/uL (ref 4.0–10.5)

## 2015-02-15 LAB — URINE CULTURE: Culture: NO GROWTH

## 2015-02-15 NOTE — Progress Notes (Signed)
Occupational Therapy Session Note  Patient Details  Name: Juan [name] MRN: [id number] Date of Birth: 1990-09-26  Today's Date: 02/15/2015 OT Individual Time: 0900-1000 OT Individual Time Calculation (min): 60 min    Short Term Goals: Week 1:  OT Short Term Goal 1 (Week 1): Pt will tolerate sitting in upright postion in chair for 1.5 with appropriate sitting system OT Short Term Goal 2 (Week 1): Pt will visually track to stimulus 75% of time with extra time.  OT Short Term Goal 3 (Week 1): Pt will demonstrate focused attention 5 seconds with max A with extra time in prep for functional task  Skilled Therapeutic Interventions/Progress Updates:    Pt seated in w/c with mother and grandmother present.  Attempted to engage patient in purposeful tasks throughout session.  Pt presented with wash cloth X 5 with request to wash face.  Pt grasped wash cloth with R hand but required physical assist to release.  Wash cloth placed on patient's head and over eyes in attempt to facilitate patient to remove wash cloth.  Pt did not make any attempt to remove it.  Pt's BUE in constant athetoid like movements with periods of rest.  Increase tone noted in pt's LUE but therapist was able to perform PROM with soft tissue mobilizations.  Medium size ball presented to patient with instructions to place RUE on ball. Question pt initiated task but inhibited by increased tone with task initiation.  Pt inconsistently made eye contact during session with upward beating nystagmus noted.  Focus on task initiation, BUE use for functional tasks, LUE PROM, activity tolerance, continued family education.  Therapy Documentation Precautions:  Precautions Precautions: Fall, Other (comment) Precaution Comments: BLE NWB; trach, peg Required Braces or Orthoses: Other Brace/Splint Other Brace/Splint: B resting hand splints; B PRAFO Restrictions Weight Bearing Restrictions: Yes RLE Weight Bearing: Non weight bearing LLE Weight  Bearing: Non weight bearing   Pain:  No s/s of pain throughout session ADL: ADL ADL Comments: see FIM  See Function Navigator for Current Functional Status.   Therapy/Group: Individual Therapy  [name] 02/15/2015, 11:00 AM

## 2015-02-15 NOTE — Progress Notes (Signed)
Plugged trach pt remained stable and doing well during and after .

## 2015-02-15 NOTE — Progress Notes (Signed)
Speech Language Pathology Daily Session Note  Patient Details  Name: Juan [name] MRN: [id number] Date of Birth: 05/03/1991  Today's Date: 02/15/2015 SLP Individual Time: 1430-1530 SLP Individual Time Calculation (min): 60 min  Short Term Goals: Week 1: SLP Short Term Goal 1 (Week 1): Patient will tolerate PMSV for ~30 minutes without s/s of distress and all vitals remaining WFL. SLP Short Term Goal 2 (Week 1): Patient will respond to yes/no questions while utilizing gestures with Max A multimodal cues in 20% of opportunities. SLP Short Term Goal 3 (Week 1): Patient will follow basic 1 step directions in 25% of opportunities with Max A multimodal cues.  SLP Short Term Goal 4 (Week 1): Patient will focus attention to a task for 30 seconds with Max A multimodal cues.   Skilled Therapeutic Interventions: Skilled treatment session focused on cognitive-linguistic goals. Upon arrival, patient was awake while supine in bed and appeared brighter with less movement today. Patient's trach was also plugged with all vitals remaining WFL. SLP facilitated session by providing oral care with suction toothbrush. Patient followed commands during oral care in 50% of opportunities with Max A multimodal cues with no bite reflex noted. Patient unable to perform verbal, automatic tasks despite Max A multimodal cues and appeared to utilize head nods X 2 in response to questions, however, difficult to determine if "true" head nods. Patient mouthed "yes" X 1 but no other verbal expression was observed although patient did appear to be groping for words? Patient noted to laugh appropriately X 4 at humor. Trials were not attempted today per PA request due to possible aspiration PNA. Patient left supine in bed with family present. Continue with current plan of care.    Function:  Eating Eating Eating activity did not occur: Safety/medical concerns   Eating Assist Level: Helper performs IV, parenteral or tube feed            Cognition Comprehension Comprehension assist level: Understands basic less than 25% of the time/ requires cueing >75% of the time  Expression Expression assistive device: Talk trach valve Expression assist level: Expresses basis less than 25% of the time/requires cueing >75% of the time.  Social Interaction Social Interaction assist level: Interacts appropriately less than 25% of the time. May be withdrawn or combative.  Problem Solving Problem solving assist level: Solves basic less than 25% of the time - needs direction nearly all the time or does not effectively solve problems and may need a restraint for safety  Memory Memory assist level: Recognizes or recalls less than 25% of the time/requires cueing greater than 75% of the time    Pain Pain Assessment Pain Assessment: Faces Pain Type: Surgical pain;Acute pain Pain Location: Generalized Pain Descriptors / Indicators: Restless Pain Onset: With Activity Pain Intervention(s): Repositioned  Therapy/Group: Individual Therapy  PAYNE, COURTNEY 02/15/2015, 4:14 PM

## 2015-02-15 NOTE — Progress Notes (Signed)
[facility]     PROGRESS NOTE    Subjective/Complaints: Had a much better night---slept 6 hours. Had fever yesterday. Still with low grade temp today ROS --cannot perform due to cognitive status  Objective: Vital Signs: Blood pressure 117/71, pulse 81, temperature 99.5 F (37.5 C), temperature source Axillary, resp. rate 18, height  (1.676 m), weight 66.679 kg (147 lb), SpO2 100 %. Dg Chest Port 1 View  02/14/2015   ADDENDUM REPORT: 02/14/2015 17:32 ADDENDUM: Comparison is made to the 01/31/2015 chest radiograph. The mild patchy left retrocardiac lung opacity appears new from 01/31/2015. Differential remains left lower lobe atelectasis or pneumonia. Electronically Signed   By: Delbert Phenix M.D.   On: 02/14/2015 17:32  02/14/2015   CLINICAL DATA:  Fever for 1 day.  EXAM: PORTABLE CHEST 1 VIEW  COMPARISON:  08/03/2012 chest radiograph  FINDINGS: Slightly left rotated chest radiograph. Tracheostomy tube tip overlies the tracheal air column at the level of the thoracic inlet. Top-normal heart size and normal mediastinal contour accounting for portable rotated technique. No pneumothorax. No pleural effusion. There is patchy left retrocardiac opacity. No pulmonary edema.  IMPRESSION: Patchy left retrocardiac opacity, cannot exclude a left lower lobe pneumonia.  Electronically Signed: By: Delbert Phenix M.D. On: 02/14/2015 15:39    Recent Labs  02/14/15 0624 02/15/15 0526  WBC 7.8 6.4  HGB 10.9* 11.1*  HCT 32.7* 34.5*  PLT 272 264    Recent Labs  02/13/15 0529 02/13/15 1244 02/14/15 0624  NA 140  --  137  K 3.7  --  4.0  CL 104  --  101  GLUCOSE 118*  --  121*  BUN 19  --  17  CREATININE 0.52* 0.66 0.70  CALCIUM 9.9  --  9.9   CBG (last 3)   Recent Labs  02/12/15 1601 02/12/15 2232 02/13/15 0749  GLUCAP 105* 112* 110*    Wt Readings from Last 3 Encounters:  02/15/15 66.679 kg (147 lb)  02/12/15 73.936 kg (163 lb)  07/31/12 63.504 kg  (140 lb)    Physical Exam:  Constitutional: He appears well-developed and well-nourished.  Alert. Restless in bed HENT:  Head: Normocephalic and atraumatic.  Right Ear: External ear normal.  Left Ear: External ear normal.  Mild thrush on tongue  Eyes: Right eye exhibits no discharge. Left eye exhibits no discharge. No scleral icterus.  Pupils reactive to light.  Neck: Normal range of motion. Neck supple. No JVD present. No tracheal deviation present. No thyromegaly present.  #4 tracheostomy tube in place--- Cardiovascular: Normal rate and regular rhythm.  Respiratory: Effort normal and breath sounds normal. No respiratory distress. He has no wheezes. i heard no rales GI: Soft. Bowel sounds are normal. He exhibits no distension. There is no tenderness. There is no rebound and no guarding.  PEG tube in place clean and dry  Genitourinary:  Indwelling Foley catheter tube in place  Musculoskeletal: He exhibits no edema.  Still moving legs in bed somewhat while asleep.  Neurological: He displays normal reflexes. He exhibits normal muscle tone.    Flexor synergy pattern in both UE. Minimal resting tone appreciated while in bed although he was supine. DTR's are 3+ in all 4's. .  Skin:  Numerous surgical scars on both LE's. All wounds essentially healed save for a scab or two. Seems to have intact PROM of LE's.  Psychiatric:  Remains internally distracted. Better attention/focus today  Assessment/Plan: 1. 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 Physiatrist is providing close team supervision and 24 hour management of active medical problems listed below. Physiatrist and rehab team continue to assess barriers to discharge/monitor patient progress toward functional and medical goals.  Function:  Bathing Bathing position      Bathing parts      Bathing assist Assist  Level: 2 helpers (nursing performed)      Upper Body Dressing/Undressing Upper body dressing   What is the patient wearing?: Pull over shirt/dress       Pull over shirt/dress - Perfomed by helper: Thread/unthread right sleeve, Thread/unthread left sleeve, Put head through opening, Pull shirt over trunk        Upper body assist        Lower Body Dressing/Undressing Lower body dressing   What is the patient wearing?: Non-skid slipper socks, Pants       Pants- Performed by helper: Thread/unthread right pants leg, Thread/unthread left pants leg, Pull pants up/down, Fasten/unfasten pants   Non-skid slipper socks- Performed by helper: Don/doff right sock, Don/doff left sock                  Lower body assist Assist Level: 2 Designer, multimedia activity did not occur: Safety/medical concerns        Toileting assist     Transfers Chair/bed transfer   Chair/bed transfer method: Other Chair/bed transfer assist level: 2 helpers Chair/bed transfer assistive device: Mechanical lift Mechanical lift: Maximove   Locomotion Ambulation Ambulation activity did not occur: Safety/medical concerns (BLE NWB)         Wheelchair Wheelchair activity did not occur: Safety/medical concerns (low level TBI) Type: Manual      Cognition Comprehension Comprehension assist level: Understands basic less than 25% of the time/ requires cueing >75% of the time  Expression Expression assist level: Expresses basis less than 25% of the time/requires cueing >75% of the time.  Social Interaction Social Interaction assist level: Interacts appropriately less than 25% of the time. May be withdrawn or combative.  Problem Solving Problem solving assist level: Solves basic less than 25% of the time - needs direction nearly all the time or does not effectively solve problems and may need a restraint for safety  Memory Memory assist level: Recognizes or recalls less than 25% of  the time/requires cueing greater than 75% of the time   Medical Problem List and Plan: 1. 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  2. DVT Prophylaxis/Anticoagulation: Subcutaneous Lovenox as well as IVC filter 01/12/2015---continue 3. Pain Management: Baclofen 10 mg 3 times a day, fentanyl patch reduced to 50 g every 72 hours,Hycet as needed 4. Mood/behavior/cognition: initiate ritalin trial today, Inderal 100 mg 3 times a day, dc cogentin, Klonopin 1.5 mg twice a day.  -try to re-establish sleep.  -changed seroquel to qhs  -continue sleep chart  -reduce day time neuro-sedating meds  -remove boots/ change to regular bed 5. Neuropsych: This patient is not capable of making decisions on his own behalf. 6. Skin/Wound Care: Routine skin checks 7. Fluids/Electrolytes/Nutrition: Routine I&O with follow-up chemistries 8. Uncontrollable rhythmic movements/spasticity. Question related to anti-psychotic medications and adjustments made. Continue Keppra and Vimpat for now -i believe these are related to his substantial internal distraction ("perpetual motion")  rather than a restless leg movement or SE of medications. 9.Tracheostomy 01/18/2015 per Dr Cammie Sickle to #4 today. 10.Gastrostomy tube 01/18/2015.Follow up SLP to address swallowing 11. Large tension pneumothorax on the right with multiple rib fractures/pulmonary  -?LLL pneumonia  -continue levaquin  -continue trach, although we'll try to plug . 12. Multiple orthopedic fractures, L1-2 transverse process fractures,/left femur fracture with IM nailing, left and right open tibia fibular fractures, transverse acetabulum fracture status post percutaneous pinning,CM pelvic ring status post ORIF, pubic symphysis and trans-sacral screw forL S I diastasis and right  sacralalo fracture. Nonweightbearing bilateral lower extremities 8 weeks until 03/17/2015 LOS (Days) 2 A FACE TO FACE EVALUATION WAS PERFORMED  SWARTZ,ZACHARY T 02/15/2015 10:43 AM

## 2015-02-15 NOTE — Care Management Note (Signed)
[facility] Individual Statement of Services  Patient Name:  Juan [name]  Date:  02/15/2015  Welcome to the [facility].  Our goal is to provide you with an individualized program based on your diagnosis and situation, designed to meet your specific needs.  With this comprehensive rehabilitation program, you will be expected to participate in at least 3 hours of rehabilitation therapies Monday-Friday, with modified therapy programming on the weekends.  Your rehabilitation program will include the following services:  Physical Therapy (PT), Occupational Therapy (OT), Speech Therapy (ST), 24 hour per day rehabilitation nursing, Therapeutic Recreaction (TR), Neuropsychology, Case Management (Social Worker), Rehabilitation Medicine, Nutrition Services and Pharmacy Services  Weekly team conferences will be held on Tuesdays to discuss your progress.  Your Social Worker will talk with you frequently to get your input and to update you on team discussions.  Team conferences with you and your family in attendance may also be held.  Expected length of stay: 4+ weeks  Overall anticipated outcome: moderate assistance  Depending on your progress and recovery, your program may change. Your Social Worker will coordinate services and will keep you informed of any changes. Your Social Worker's name and contact numbers are listed  below.  The following services may also be recommended but are not provided by the [facility]patient [facility]:   Driving Evaluations  Home Health Rehabiltiation Services  Outpatient Rehabilitation Services  Vocational Rehabilitation   Arrangements will be made to provide these services after discharge if needed.  Arrangements include referral to agencies that provide these services.  Your insurance has been verified to be:  [facility] Your primary doctor is:  None (primary care being established)  Pertinent information will be shared  with your doctor and your insurance company.  Social Worker:  [place], [place] [phone] or ([phone]   Information discussed with and copy given to patient by: Amada Jupiter, 02/15/2015, 9:10 AM

## 2015-02-15 NOTE — Plan of Care (Signed)
Problem: RH BLADDER ELIMINATION Goal: RH STG MANAGE BLADDER WITH ASSISTANCE STG Manage Bladder With mod Assistance  Outcome: Not Progressing Opt requiring I&O cath at this time

## 2015-02-15 NOTE — Progress Notes (Signed)
Physical Therapy Session Note  Patient Details  Name: Juan [name] MRN: [id number] Date of Birth: 1990/11/13  Today's Date: 02/15/2015 PT Individual Time: 0800-0900 PT Individual Time Calculation (min): 60 min   Short Term Goals: Week 1:  PT Short Term Goal 1 (Week 1): Patient will demonstrate focused attention x 30 sec with max multimodal cues.  PT Short Term Goal 2 (Week 1): Patient will tolerate OOB activity in upright position x 2 hours with supervision.  PT Short Term Goal 3 (Week 1): Patient will follow basic one step commands 25% of available opportunities with max multimodal cues.   Skilled Therapeutic Interventions/Progress Updates:   Session focused on engaging patient in purposeful tasks, following commands, orientation, and sitting tolerance. Patient awake in bed, appearing more alert and engaged noted by improved eye contact to auditory stimuli and participation in session. Performed rolling to R and L multiple times with patient initiating movement consistently with lower body, requiring max-total A at upper body to complete rolling. Patient transferred to sitting edge of bed and performed static sitting x approx 5 min with max-total A of therapist behind patient and +2A for safety with rehab tech in front of patient due to extensor thrust, requiring repositioning multiple times to prevent patient from sliding forward off bed. Due to increased restless movement sitting edge of bed, patient transferred to [facility] wheelchair via [facility] with +2A. In wheelchair, patient appeared to follow commands with tooth brush about half of the time out of available opportunities with max verbal cues. When hairbrush placed in patient's hand he put brush in mouth. Patient required [facility] assist to put on chapstick, brush hair, and wash face with washcloth. Patient with increased difficulty demonstrating purposeful movement with BUE but would open hands on command 75% of time to release objects/therapist's  hand. Increased flexor tone LUE > RUE, performed PROM to BUE as able throughout session. Patient remained nonverbal throughout session but appeared to shake head "no" x 1. Patient became emotional and tearful x 2 during session, in bed when rolling and in wheelchair after therapist presented photos of patient with girlfriend. Patient left sitting in wheelchair, handoff to OT.   Therapy Documentation Precautions:  Precautions Precautions: Fall, Other (comment) Precaution Comments: BLE NWB; trach, peg Required Braces or Orthoses: Other Brace/Splint Other Brace/Splint: B resting hand splints; B PRAFO Restrictions Weight Bearing Restrictions: Yes RLE Weight Bearing: Non weight bearing LLE Weight Bearing: Non weight bearing Pain: Pain Assessment Pain Assessment: Faces Pain Type: Surgical pain;Acute pain Pain Location: Generalized Pain Descriptors / Indicators: Restless Pain Onset: With Activity Pain Intervention(s): Repositioned   See Function Navigator for Current Functional Status.   Therapy/Group: Individual Therapy  Kerney Elbe 02/15/2015, 12:41 PM

## 2015-02-15 NOTE — Progress Notes (Signed)
Physical Therapy Session Note  Patient Details  Name: Juan [name] MRN: [id number] Date of Birth: 06-28-1990  Today's Date: 02/15/2015 PT Individual Time: 1030-1100 PT Individual Time Calculation (min): 30 min   Short Term Goals: Week 1:  PT Short Term Goal 1 (Week 1): Patient will demonstrate focused attention x 30 sec with max multimodal cues.  PT Short Term Goal 2 (Week 1): Patient will tolerate OOB activity in upright position x 2 hours with supervision.  PT Short Term Goal 3 (Week 1): Patient will follow basic one step commands 25% of available opportunities with max multimodal cues.   Skilled Therapeutic Interventions/Progress Updates:    Pt received up in TIS w/c with nurse administering meds through PEG tube. Session focuses on one-step commands, attention to task, intellectual awareness, and functional mobility. PT asks pt yes/no questions re: birth month and pt correctly shakes had "no" in response to two incorrect months after a few seconds pause to process, but does not nod head "yes" when correct month is stated. PT instructs pt in attempted open-chain laq x 10 reps on each side - PT suspects pt is attempting purposeful movement, but strong presence of athetoid movements also make discerning purposeful movement difficult. PT instructs pt in scoot transfer w/c to/from bed with slideboard - foot support maintained throughout transfer in order to reduce extensor hip thrust tone for safety - max A x 2. One EOM, pt req max A - 2 person max A for ~1 minute static sit balance. Pt's restlessness increases, so pt slideboard transferred back to TIS w/c and restlessness decreased, vitals stable, quick release belt put in place. RN notified that pt needs to be transferred back to bed to rest - transport was actively exchanging pt's bed and pt left with PT tech in hallway, awaiting new bed. Continue per PT POC.   Therapy Documentation Precautions:  Precautions Precautions: Fall, Other  (comment) Precaution Comments: BLE NWB; trach, peg Required Braces or Orthoses: Other Brace/Splint Other Brace/Splint: B resting hand splints; B PRAFO Restrictions Weight Bearing Restrictions: Yes RLE Weight Bearing: Non weight bearing LLE Weight Bearing: Non weight bearing Vital Signs: Pt's SpO2 and HR measured frequently throughout treatment and SpO2 remains >= 95% on RA and HR between 93-105 bpm with all activity and at rest.  Pain: Pain Assessment Pain Assessment: Faces Pain Type: Surgical pain;Acute pain Pain Location: Generalized Pain Descriptors / Indicators: Restless Pain Onset: With Activity Pain Intervention(s): Repositioned   See Function Navigator for Current Functional Status.   Therapy/Group: Individual Therapy and Kelvin, PT Tech as +2  [facility] M 02/15/2015, 12:37 PM

## 2015-02-16 ENCOUNTER — Inpatient Hospital Stay (HOSPITAL_COMMUNITY): Payer: 59 | Admitting: Occupational Therapy

## 2015-02-16 ENCOUNTER — Inpatient Hospital Stay (HOSPITAL_COMMUNITY): Payer: 59

## 2015-02-16 ENCOUNTER — Inpatient Hospital Stay (HOSPITAL_COMMUNITY): Payer: 59 | Admitting: Physical Therapy

## 2015-02-16 ENCOUNTER — Inpatient Hospital Stay (HOSPITAL_COMMUNITY): Payer: Self-pay | Admitting: Physical Therapy

## 2015-02-16 MED ORDER — METHYLPHENIDATE HCL 5 MG PO TABS
10.0000 mg | ORAL_TABLET | Freq: Two times a day (BID) | ORAL | Status: DC
  Administered 2015-02-16 – 2015-02-28 (×23): 10 mg
  Filled 2015-02-16 (×24): qty 2

## 2015-02-16 MED ORDER — TAMSULOSIN HCL 0.4 MG PO CAPS
0.4000 mg | ORAL_CAPSULE | Freq: Every day | ORAL | Status: DC
  Administered 2015-02-16 – 2015-03-20 (×33): 0.4 mg via ORAL
  Filled 2015-02-16 (×33): qty 1

## 2015-02-16 MED ORDER — BACLOFEN 10 MG PO TABS
5.0000 mg | ORAL_TABLET | Freq: Three times a day (TID) | ORAL | Status: DC
  Administered 2015-02-16 – 2015-02-19 (×11): 5 mg via ORAL
  Filled 2015-02-16 (×12): qty 1
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
## 2015-02-16 NOTE — Progress Notes (Signed)
Occupational Therapy Session Note  Patient Details  Name: Juan [name] MRN: [id number] Date of Birth: 1990/12/16  Today's Date: 02/16/2015 OT Individual Time: 1000-1100 OT Individual Time Calculation (min): 60 min    Short Term Goals: Week 1:  OT Short Term Goal 1 (Week 1): Pt will tolerate sitting in upright postion in chair for 1.5 with appropriate sitting system OT Short Term Goal 2 (Week 1): Pt will visually track to stimulus 75% of time with extra time.  OT Short Term Goal 3 (Week 1): Pt will demonstrate focused attention 5 seconds with max A with extra time in prep for functional task  Skilled Therapeutic Interventions/Progress Updates:    1:1 Pt in bed when arrived. Mother and grandmother left the room when arrived. Focus of session was on bed mobility, sitting balance at EOB focused attention, sustained purposeful visual gaze, following one step commands, trunk/ postural control, purposeful initiation for task, use of bilateral UEs, functional reach and relaxation from tone in left UE, participation in automatic functionals tasks including washing face, brushing teeth, and donning shirt.   Pt able to fixate gaze on clinician's face with calling his name. Pt engaged in bed mobility to come to EOB. Pt required max A to roll to his left and max A+2 to come into sitting position- but did initiate pushing to come into sitting. At EOB focus on sitting balance with mod to total A with posterior support. This clinician sat in front of pt and provided tactile cues to for pt to initiate lateral leans both ways to finishing pulling up pants.  Pt demonstrated initiation on right and left to return to seated position at midline from sidelying propped position. Pt able to whisper his name, both clinician's name, "mom" and "yes/no" (multiple times) while sitting EOB. Engaged in donning shirt with hand over hand support. Focus on functional reach with bilateral UEs with support against gravity and a  purpose.  Pt able to demonstrate relaxation of left pec and bicep to reach forward as well as lift UE (shoulder flexion) with A against gravity to don deodorant with multimodal cues. [name] moved used to transfer pt to tilt in space w/c. Transitioned to sink to look at self- became tearful in session multiple times. Pt able to follow one step motor commands with more than reasonable amt of time and multimodal cues. Pass off to next therapy.  Discussed progress in session with pt's mother.    Therapy Documentation Precautions:  Precautions Precautions: Fall, Other (comment) Precaution Comments: BLE NWB; trach, peg Required Braces or Orthoses: Other Brace/Splint Other Brace/Splint: B resting hand splints; B PRAFO Restrictions Weight Bearing Restrictions: Yes RLE Weight Bearing: Non weight bearing LLE Weight Bearing: Non weight bearing Pain: Pain Assessment Pain Assessment: Faces Faces Pain Scale: No hurt ADL: ADL ADL Comments: see FIM  See Function Navigator for Current Functional Status.   Therapy/Group: Individual Therapy  Roney Mans [facility] 02/16/2015, 2:40 PM

## 2015-02-16 NOTE — Progress Notes (Addendum)
Nursing Note: Pt continues to be restless.Pt slept well till he became restless this morning at 0530.wbb

## 2015-02-16 NOTE — Progress Notes (Signed)
Nursing Note: pt in and out cathed for 450 cc of clear yellow urine. No voids this shift. Pt was cathed earlier at 2315 for 350 cc.Pt is very restless.ALL OVER the bed.Difficult to cath pt had Mom not been at the bedside to hold pt.A: Pt medicated for pain.wbb

## 2015-02-16 NOTE — Progress Notes (Signed)
Physical Therapy Session Note  Patient Details  Name: Juan [name] MRN: [id number] Date of Birth: [birth year]  Today's Date: 02/16/2015 PT Individual Time: 1100-1200 and 1530-1600 PT Individual Time Calculation (min): 60 min and 30 min  Short Term Goals: Week 1:  PT Short Term Goal 1 (Week 1): Patient will demonstrate focused attention x 30 sec with max multimodal cues.  PT Short Term Goal 2 (Week 1): Patient will tolerate OOB activity in upright position x 2 hours with supervision.  PT Short Term Goal 3 (Week 1): Patient will follow basic one step commands 25% of available opportunities with max multimodal cues.   Skilled Therapeutic Interventions/Progress Updates:   Session 1: Focus on bed mobility, sitting balance, focused attention, task initiation, following one step commands, and purposeful movement. Patient in tilted position in wheelchair upon arrival, handoff from OT. Wheelchair moved to upright position and patient worked on supported upright sitting with min-mod A from therapist due to constant restless movement and LOB forward and laterally. Patient able to answer in whispers to 3 questions from therapist out of multiple opportunities with extra time. Performed slide board transfers with max A x 2 and engaged patient in static sitting balance with feet unsupported at edge of mat, mod-total A of one person and +2 for safety. Patient with increased restless movement, anticipate due to fatigue and transitioned to semi reclined with wedge on mat. Instructed patient in rolling to R and L with focus on initiating movement, able to initiate purposefully x 1 with rolling to L then perseverated on rolling to L despite cues. Patient returned to wheelchair and left in room with family.   Session 2: Focus on sitting balance, task initiation, purposeful movement, following one step commands, and focused attention. Patient semi reclined in bed, transferred supine > sit with HOB elevated with max A x  1. Seated edge of bed, performed sitting balance with therapists positioned behind patient and on patient's R side while engaged in locating and naming tools on tray table in front of patient and using RUE with assist against gravity to point to tools with mod-max multimodal cues. Patient appeared to initiate movement 50% of time and answered clinician's questions in whispers 75% of available opportunities with increased time. Patient transferred back to bed with max A x 2 and left semi reclined in bed with bed alarm on and 3 rails up, patient's family notified that session ended.     Therapy Documentation Precautions:  Precautions Precautions: Fall, Other (comment) Precaution Comments: BLE NWB; trach, peg Required Braces or Orthoses: Other Brace/Splint Other Brace/Splint: B resting hand splints; B PRAFO Restrictions Weight Bearing Restrictions: Yes RLE Weight Bearing: Non weight bearing LLE Weight Bearing: Non weight bearing Pain: Pain Assessment Pain Assessment: Faces Faces Pain Scale: No hurt   See Function Navigator for Current Functional Status.   Therapy/Group: Individual Therapy  Kerney Elbe 02/16/2015, 12:54 PM

## 2015-02-17 ENCOUNTER — Inpatient Hospital Stay (HOSPITAL_COMMUNITY): Payer: 59 | Admitting: Speech Pathology

## 2015-02-17 DIAGNOSIS — T791XXS Fat embolism (traumatic), sequela: Secondary | ICD-10-CM

## 2015-02-17 DIAGNOSIS — G259 Extrapyramidal and movement disorder, unspecified: Secondary | ICD-10-CM

## 2015-02-17 DIAGNOSIS — R1314 Dysphagia, pharyngoesophageal phase: Secondary | ICD-10-CM

## 2015-02-17 DIAGNOSIS — R569 Unspecified convulsions: Secondary | ICD-10-CM

## 2015-02-17 DIAGNOSIS — S32810S Multiple fractures of pelvis with stable disruption of pelvic ring, sequela: Secondary | ICD-10-CM

## 2015-02-17 NOTE — Progress Notes (Addendum)
[facility]YSICAL [facility]IC[facility] & REHABILITATION     PROGRESS NOTE    Subjective/Complaints: Pt slept well overnight.  No fevers overnight.  Pt's family notes good participation in therapies with improved mood and awareness.  Family requesting information on results of blood and urine cx- informed family of neg Urine cx and pending final blood cx.   ROS --cannot perform due to cognitive status  Objective: Vital Signs: Blood pressure 114/70, pulse 97, temperature 98.6 F (37 C), temperature source Oral, resp. rate 18, height  (1.676 m), weight 68.1 kg (150 lb 2.1 oz), SpO2 98 %. Dg Chest Port 1 View  02/16/2015   CLINICAL DATA:  Evaluate for pneumonia  EXAM: PORTABLE CHEST 1 VIEW  COMPARISON:  02/14/2015  FINDINGS: Tracheostomy tube tip is above the carina. Heart size is normal. No pleural effusion or edema. No airspace consolidation.  IMPRESSION: 1. No acute cardiopulmonary abnormalities. No evidence for pneumonia.   Electronically Signed   By: Signa Kell M.D.   On: 02/16/2015 08:35    Recent Labs  02/15/15 0526  WBC 6.4  HGB 11.1*  HCT 34.5*  PLT 264   No results for input(s): NA, K, CL, GLUCOSE, BUN, CREATININE, CALCIUM in the last 72 hours.  Invalid input(s): CO CBG (last 3)  No results for input(s): GLUCAP in the last 72 hours.  Wt Readings from Last 3 Encounters:  02/17/15 68.1 kg (150 lb 2.1 oz)  02/12/15 73.936 kg (163 lb)  07/31/12 63.504 kg (140 lb)    Physical Exam:  Constitutional: He appears well-developed and well-nourished.  Alert.  HENT:  Head: Normocephalic and atraumatic.  Right Ear: External ear normal.  Left Ear: External ear normal.  Mild thrush on tongue  Eyes: Right eye exhibits no discharge. Left eye exhibits no discharge. No scleral icterus.  Pupils reactive to light.  Neck: Normal range of motion. Neck supple. No JVD present. No tracheal deviation present. No thyromegaly present.  #4 tracheostomy tube in place--- Cardiovascular:  Normal rate and regular rhythm.  Respiratory: Effort normal and breath sounds normal. No respiratory distress. He has no wheezes. i heard no rales GI: Soft. Bowel sounds are normal. He exhibits no distension. There is no tenderness. There is no rebound and no guarding.  PEG tube in place clean and dry  Genitourinary:  Indwelling Foley catheter tube in place  Musculoskeletal: He exhibits no edema.  Neurological: Increased muscle tone in b/l UE.   Flexor synergy pattern in both UE.   Skin:  Numerous surgical scars on both LE's. All wounds essentially healed save for a scab or two. Seems to have intact PROM of LE's.  Psychiatric:  Remains internally distracted. Labile mood  Assessment/Plan: 1. 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 Physiatrist is providing close team supervision and 24 hour management of active medical problems listed below. Physiatrist and rehab team continue to assess barriers to discharge/monitor patient progress toward functional and medical goals.  Function:  Bathing Bathing position   Position: Bed  Bathing parts   Body parts bathed by helper: Right arm, Left arm, Chest, Abdomen, Front perineal area, Right upper leg, Buttocks, Left upper leg, Right lower leg, Left lower leg, Back  Bathing assist Assist Level: 2 helpers (Total assist)      Upper Body Dressing/Undressing Upper body dressing   What is the patient wearing?: Hospital gown       Pull over  shirt/dress - Perfomed by helper: Thread/unthread right sleeve, Thread/unthread left sleeve, Put head through opening, Pull shirt over trunk        Upper body assist        Lower Body Dressing/Undressing Lower body dressing   What is the patient wearing?: Hospital Gown       Pants- Performed by helper: Thread/unthread right pants leg, Thread/unthread left pants leg, Pull pants up/down,  Fasten/unfasten pants   Non-skid slipper socks- Performed by helper: Don/doff right sock, Don/doff left sock                  Lower body assist Assist Level: 2 Designer, multimedia activity did not occur: Safety/medical concerns        Toileting assist     Transfers Chair/bed transfer   Chair/bed transfer method: Lateral scoot Chair/bed transfer assist level: 2 helpers Chair/bed transfer assistive device: Sliding board Mechanical lift: Maximove   Locomotion Ambulation Ambulation activity did not occur: Safety/medical concerns (BLE NWB)         Wheelchair Wheelchair activity did not occur: Safety/medical concerns (low level TBI) Type: Manual   Assist Level: Dependent (Pt equals 0%)  Cognition Comprehension Comprehension assist level: Understands basic less than 25% of the time/ requires cueing >75% of the time  Expression Expression assist level: Expresses basis less than 25% of the time/requires cueing >75% of the time.  Social Interaction Social Interaction assist level: Interacts appropriately less than 25% of the time. May be withdrawn or combative.  Problem Solving Problem solving assist level: Solves basic less than 25% of the time - needs direction nearly all the time or does not effectively solve problems and may need a restraint for safety  Memory Memory assist level: Recognizes or recalls less than 25% of the time/requires cueing greater than 75% of the time   Medical Problem List and Plan: 1. 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 Activity tolerance improving also   .  2. DVT Prophylaxis/Anticoagulation: Subcutaneous Lovenox as well as IVC filter 01/12/2015---continue 3. Pain Management: Baclofen 10 mg 3 times a day, fentanyl patch reduced to 50 g every 72 hours,Hycet as needed 4.  Mood/behavior/cognition: increase ritalin to .  Inderal 100 mg 3 times a day,   Klonopin 1.5 mg twice a day.  -sleep much improved  -continue seroquel to qhs  -continue sleep chart  -reduce day time neuro-sedating meds as possible  -removed boots/ changed to regular bed 5. Neuropsych: This patient is not capable of making decisions on his own behalf. 6. Skin/Wound Care: Routine skin checks 7. Fluids/Electrolytes/Nutrition: continue to monitor labs.  8. Uncontrollable rhythmic movements/spasticity. Question related to anti-psychotic medications and adjustments made. 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 Large tension pneumothorax on the right with multiple rib fractures/pulmonary  -?LLL pneumonia---CXR without disease- latest  -continue levaquin for now given low grade temp  -continue plugged trach---if no issues over weekend, can look at decannulating monday. 12. Multiple orthopedic fractures, L1-2 transverse process fractures,/left femur fracture with IM nailing, left and right open tibia fibular fractures, transverse acetabulum fracture  status post percutaneous pinning,CM pelvic ring status post ORIF, pubic symphysis and trans-sacral screw forL S I diastasis and right sacralalo fracture. Nonweightbearing bilateral lower extremities 8 weeks until 03/17/2015 13. Urine retention/neurogenic bladder: flomax trial  I/O CATH prn  -encourage spontaneous voids as possible 14. Infectious  - Urine cx: NG  - Blood cx: NGTD  LOS (Days) 4 A FACE TO FACE EVALUATION WAS PERFORMED  Ankit Karis Juba 02/17/2015 11:58 AM

## 2015-02-17 NOTE — Progress Notes (Signed)
Speech Language Pathology Daily Session Note  Patient Details  Name: Juan [name] MRN: [id number] Date of Birth: [birth year]  Today's Date: 02/17/2015 SLP Individual Time: 1000-1045 SLP Individual Time Calculation (min): 45 min  Short Term Goals: Week 1: SLP Short Term Goal 1 (Week 1): Patient will tolerate PMSV for ~30 minutes without s/s of distress and all vitals remaining WFL. SLP Short Term Goal 2 (Week 1): Patient will respond to yes/no questions while utilizing gestures with Max A multimodal cues in 20% of opportunities. SLP Short Term Goal 3 (Week 1): Patient will follow basic 1 step directions in 25% of opportunities with Max A multimodal cues.  SLP Short Term Goal 4 (Week 1): Patient will focus attention to a task for 30 seconds with Max A multimodal cues.  SLP Short Term Goal 5 (Week 1): Patient will consume trials of ice chips without overt s/s of aspiration with Max A multimodal cues over 3 sessions.   Skilled Therapeutic Interventions: Skilled treatment session focused on dysphagia and cognitive goals. Upon arrival, patient appeared lethargic while supine in bed with patient's mother reporting lack of sleep the previous night. Patient initially required Max A multimodal cues to maintain arousal for ~30 seconds. SLP performed oral care via suction toothbrush and patient required extra time and Max A multimodal cues to follow 1 step commands. Patient consumed trials of ice chips and demonstrated what appeared to be a timely swallow initiation without overt s/s of aspiration. Patient was nonverbal this session despite Max A multimodal cues from clinician and family.  Patient's trach remains plugged with all vitals WFL. Patient left supine in bed with family present. Continue with current plan of care.    Function:  Eating Eating   Modified Consistency Diet: Yes Eating Assist Level: Helper feeds patient (with trials from SLP )           Cognition Comprehension Comprehension  assist level: Understands basic less than 25% of the time/ requires cueing >75% of the time  Expression   Expression assist level: Expresses basis less than 25% of the time/requires cueing >75% of the time.  Social Interaction Social Interaction assist level: Interacts appropriately less than 25% of the time. May be withdrawn or combative.  Problem Solving Problem solving assist level: Solves basic less than 25% of the time - needs direction nearly all the time or does not effectively solve problems and may need a restraint for safety  Memory Memory assist level: Recognizes or recalls less than 25% of the time/requires cueing greater than 75% of the time    Pain Pain Assessment Pain Assessment: Faces Faces Pain Scale: No hurt  Therapy/Group: Individual Therapy  Chany Woolworth 02/17/2015, 12:04 PM

## 2015-02-18 ENCOUNTER — Inpatient Hospital Stay (HOSPITAL_COMMUNITY): Payer: 59

## 2015-02-18 DIAGNOSIS — S32810S Multiple fractures of pelvis with stable disruption of pelvic ring, sequela: Secondary | ICD-10-CM

## 2015-02-18 DIAGNOSIS — G259 Extrapyramidal and movement disorder, unspecified: Secondary | ICD-10-CM

## 2015-02-18 DIAGNOSIS — T791XXS Fat embolism (traumatic), sequela: Secondary | ICD-10-CM

## 2015-02-18 DIAGNOSIS — R1314 Dysphagia, pharyngoesophageal phase: Secondary | ICD-10-CM

## 2015-02-18 DIAGNOSIS — R569 Unspecified convulsions: Secondary | ICD-10-CM

## 2015-02-18 NOTE — Progress Notes (Addendum)
Physical Therapy Session Note  Patient Details  Name: Juan [name] MRN: [id number] Date of Birth: [birth year]  Today's Date: 02/18/2015 PT Individual Time: 1000-1100 PT Individual Time Calculation (min): 60 min   Short Term Goals: Week 1:  PT Short Term Goal 1 (Week 1): Patient will demonstrate focused attention x 30 sec with max multimodal cues.  PT Short Term Goal 2 (Week 1): Patient will tolerate OOB activity in upright position x 2 hours with supervision.  PT Short Term Goal 3 (Week 1): Patient will follow basic one step commands 25% of available opportunities with max multimodal cues.      Skilled Therapeutic Interventions/Progress Updates:  Pt received up in w/c.    No pain indicated during session. O2 sats stayed > 94% throughout session.  Pt awake and alert.   Session focused on focused attention; following 1 step commands; intentional use of bil UEs and bil LEs for functional tasks; some in supported sitting in w/c, and some sitting EOB +2 assist. Pt whispered his name and girlfriend's name when questioned.  He focused on tasks with balls 1- 2 minutes.  He made eye contact when requested, intermittently.   Pt transferred back to bed at end of session, HOB 30 degrees, and bed alarm on.  All needs left in place.  PT informed family that session was finished.    Therapy Documentation Precautions:  Precautions Precautions: Fall, Other (comment) Precaution Comments: BLE NWB; trach, peg Required Braces or Orthoses: Other Brace/Splint Other Brace/Splint: B resting hand splints; B PRAFO Restrictions Weight Bearing Restrictions: Yes RLE Weight Bearing: Non weight bearing LLE Weight Bearing: Non weight bearing          Other Treatments: Treatments Therapeutic Activity: R/L lateral leans, A-P leans in unsupported sitting EOB; manipulated soft blue ball with bil hands to roll off lap; squeezed ball between bil knees; wiped face with wet wash cloth with hand over hand  assistance Neuromuscular Facilitation: Right;Left;Upper Extremity;Lower Extremity;Activity to increase grading;Activity to increase lateral weight shifting;Activity to increase anterior-posterior weight shifting;Activity to increase coordination Weight Bearing Technique Weight Bearing Technique: Yes RUE Weight Bearing Technique: Forearm seated LUE Weight Bearing Technique: Forearm seated   See Function Navigator for Current Functional Status.   Therapy/Group: Individual Therapy  Rosamund Nyland 02/18/2015, 12:49 PM

## 2015-02-18 NOTE — Progress Notes (Signed)
Occupational Therapy Session Note  Patient Details  Name: Juan [name] MRN: [id number] Date of Birth: [birth year]  Today's Date: 02/18/2015 OT Individual Time: 0800-0900 OT Individual Time Calculation (min): 60 min    Short Term Goals: Week 1:  OT Short Term Goal 1 (Week 1): Pt will tolerate sitting in upright postion in chair for 1.5 with appropriate sitting system OT Short Term Goal 2 (Week 1): Pt will visually track to stimulus 75% of time with extra time.  OT Short Term Goal 3 (Week 1): Pt will demonstrate focused attention 5 seconds with max A with extra time in prep for functional task  Skilled Therapeutic Interventions/Progress Updates:    Pt resting in bed with Mom and Grandmother present.  Pt verbalized "Damn I'm drowsy." Pt's Mom started he was administered some medication at 0230 and he was drowsy this morning. Pt's Mom reported that pt stated he does not like "feeling this way." Pt exhibited difficulty eyes open throughout session.  Initial portion of session focused on bed mobility with emphasis on following one step commands to assist with placing [name] Move sling.  Pt required min A to initiate task.  Pt transitioned to sitting EOB with focus on initiating donning pullover shirt.  Pt initiated placing RUE into shirt sleeve but inhibited by increased RUE tone with task initiation.  Pt transferred to w/c and engaged in task initiation and following one step commands.  Pt continued to exhibit difficulty with keeping eyes open while seated.  Pt eyes would regularly roll upwards and eyes would close.  Pt would refocus when spoken too and direct gaze to therapist for approx 15 seconds before closing them again.  Attempted to engage patient in washing face.  OT performed RUE PROM while patient attempted to initiate RUE in functional task.  Focus on bed mobility, task initiation, sitting balance, following one step commands, attention, and activity tolerance.  Therapy  Documentation Precautions:  Precautions Precautions: Fall, Other (comment) Precaution Comments: BLE NWB; trach, peg Required Braces or Orthoses: Other Brace/Splint Other Brace/Splint: B resting hand splints; B PRAFO Restrictions Weight Bearing Restrictions: Yes RLE Weight Bearing: Non weight bearing LLE Weight Bearing: Non weight bearing Pain: Pain Assessment Pain Assessment: Faces Faces Pain Scale: No hurt ADL: ADL ADL Comments: see FIM  See Function Navigator for Current Functional Status.   Therapy/Group: Individual Therapy  [name] 02/18/2015, 9:00 AM

## 2015-02-18 NOTE — Plan of Care (Signed)
Problem: RH BLADDER ELIMINATION Goal: RH STG MANAGE BLADDER WITH EQUIPMENT WITH ASSISTANCE STG Manage Bladder With Equipment With maxx Assistance  Outcome: Not Progressing I/O cath q6hours

## 2015-02-18 NOTE — Progress Notes (Signed)
Pt resting more comfortably tonight. Father at the bedside. Family refused HS bath per RN report, family wanted patient to not be disturbed since already sleep, VS were also not taken during HS. Minimized sleep interruptions during the shift as much as possible. Cont plan of care. Ramar Nobrega, Phill Mutter

## 2015-02-18 NOTE — Progress Notes (Signed)
[place] PHYSICAL MEDICINE & REHABILITATION     PROGRESS NOTE    Subjective/Complaints: Pt's family notes that pt is drowsy this AM.  They state he received some medication over night and is still feeling the effects.  He is more emotional today.    ROS --cannot perform due to cognitive status  Objective: Vital Signs: Blood pressure 128/71, pulse 107, temperature 98.8 F (37.1 C), temperature source Oral, resp. rate 18, height  (1.676 m), weight 68.5 kg (151 lb 0.2 oz), SpO2 100 %. No results found. No results for input(s): WBC, HGB, HCT, PLT in the last 72 hours. No results for input(s): NA, K, CL, GLUCOSE, BUN, CREATININE, CALCIUM in the last 72 hours.  Invalid input(s): CO CBG (last 3)  No results for input(s): GLUCAP in the last 72 hours.  Wt Readings from Last 3 Encounters:  02/18/15 68.5 kg (151 lb 0.2 oz)  02/12/15 73.936 kg (163 lb)  07/31/12 63.504 kg (140 lb)    Physical Exam:  Constitutional: He appears well-developed and well-nourished.  Alert.  HENT:  Head: Normocephalic and atraumatic.  Right Ear: External ear normal.  Left Ear: External ear normal.  Mild thrush on tongue  Eyes: Right eye exhibits no discharge. Left eye exhibits no discharge. No scleral icterus. EOMI.  Neck: Normal range of motion. Neck supple. No JVD present. No tracheal deviation present. No thyromegaly present.  #4 tracheostomy tube in place--- Cardiovascular: Normal rate and regular rhythm.  Respiratory: Effort normal and breath sounds normal. No respiratory distress. He has no wheezes. i heard no rales GI: Soft. Bowel sounds are normal. He exhibits no distension. There is no tenderness. There is no rebound and no guarding.  PEG tube in place clean and dry  Genitourinary:  Indwelling Foley catheter tube in place  Musculoskeletal: He exhibits no edema.  Neurological: Increased muscle tone in b/l UE.   Flexor synergy pattern in both UE.   Skin:  Numerous surgical scars on  both LE's. All wounds essentially healed save for a scab or two. Seems to have intact PROM of LE's.  Psychiatric:  Remains internally distracted. Labile mood  Assessment/Plan: 1. 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 Physiatrist is providing close team supervision and 24 hour management of active medical problems listed below. Physiatrist and rehab team continue to assess barriers to discharge/monitor patient progress toward functional and medical goals.  Function:  Bathing Bathing position   Position: Bed  Bathing parts   Body parts bathed by helper: Right arm, Left arm, Chest, Abdomen, Front perineal area, Right upper leg, Buttocks, Left upper leg, Right lower leg, Left lower leg, Back  Bathing assist Assist Level: 2 helpers (Total assist)      Upper Body Dressing/Undressing Upper body dressing   What is the patient wearing?: Hospital gown       Pull over shirt/dress - Perfomed by helper: Thread/unthread right sleeve, Thread/unthread left sleeve, Put head through opening, Pull shirt over trunk        Upper body assist        Lower Body Dressing/Undressing Lower body dressing   What is the patient wearing?: Hospital Gown       Pants- Performed by helper: Thread/unthread right pants leg, Thread/unthread left pants leg, Pull pants up/down, Fasten/unfasten pants   Non-skid slipper socks- Performed by helper: Don/doff right sock, Don/doff left sock  Lower body assist Assist Level: 2 Designer, multimedia activity did not occur: Safety/medical concerns        Toileting assist     Transfers Chair/bed transfer   Chair/bed transfer method: Lateral scoot Chair/bed transfer assist level: 2 helpers Chair/bed transfer assistive device: Sliding board Mechanical lift: Maximove   Locomotion Ambulation  Ambulation activity did not occur: Safety/medical concerns (BLE NWB)         Wheelchair Wheelchair activity did not occur: Safety/medical concerns (low level TBI) Type: Manual   Assist Level: Dependent (Pt equals 0%)  Cognition Comprehension Comprehension assist level: Understands basic 90% of the time/cues < 10% of the time  Expression Expression assist level: Expresses basis less than 25% of the time/requires cueing >75% of the time.  Social Interaction Social Interaction assist level: Interacts appropriately less than 25% of the time. May be withdrawn or combative.  Problem Solving Problem solving assist level: Solves basic less than 25% of the time - needs direction nearly all the time or does not effectively solve problems and may need a restraint for safety  Memory Memory assist level: Recognizes or recalls less than 25% of the time/requires cueing greater than 75% of the time   Medical Problem List and Plan: 1. 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 Activity tolerance improving also   .  2. DVT Prophylaxis/Anticoagulation: Subcutaneous Lovenox as well as IVC filter 01/12/2015---continue 3. Pain Management: Baclofen 10 mg 3 times a day, fentanyl patch reduced to 50 g every 72 hours,Hycet as needed  - It is not likely that pt's drowsiness this AM is related to pain medication he received last night.  Will cont to monitor and make adjustments if necessary. 4. Mood/behavior/cognition: increase ritalin to .  Inderal 100 mg 3 times a day,   Klonopin 1.5 mg twice a day.  -sleep much improved  -continue seroquel to qhs  -continue sleep chart  -reduce day time neuro-sedating meds as possible  -removed boots/ changed to regular bed 5. Neuropsych: This patient is not capable of making decisions on his own behalf. 6. Skin/Wound Care: Routine  skin checks 7. Fluids/Electrolytes/Nutrition: continue to monitor labs.  8. Uncontrollable rhythmic movements/spasticity. Question related to anti-psychotic medications and adjustments made. 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 Large tension pneumothorax on the right with multiple rib fractures/pulmonary  -?LLL pneumonia---CXR without disease- latest  -continue levaquin for now given low grade temp  -continue plugged trach---if no issues over weekend, can look at decannulating monday. 12. Multiple orthopedic fractures, L1-2 transverse process fractures,/left femur fracture with IM nailing, left and right open tibia fibular fractures, transverse acetabulum fracture status post percutaneous pinning,CM pelvic ring status post ORIF, pubic symphysis and trans-sacral screw forL S I diastasis and right sacralalo fracture. Nonweightbearing bilateral lower extremities 8 weeks until 03/17/2015 13. Urine retention/neurogenic bladder: flomax trial  I/O CATH prn  -encourage spontaneous voids as possible 14. Infectious  - Urine cx: NG  - Blood cx: NGTD x3 days  LOS (Days) 5 A FACE TO FACE EVALUATION WAS PERFORMED  Haadiya Frogge Karis Juba 02/18/2015 4:38 PM

## 2015-02-18 NOTE — Progress Notes (Signed)
Occupational Therapy Note  Patient Details  Name: Juan [name] MRN: [id number] Date of Birth: 1991-02-25  Today's Date: 02/18/2015  Session 1 OT Individual Time: 4098-1191 OT Individual Time Calculation (min): 45 min  Session 2 OT Individual Time: 1430-1515  Session 1 Pt exhibited no s/s of pain Individual Therapy Focus on following one step commands, bed mobility, sitting balance, and increased functional use of BUE.  Pt rolled in bed (right and left) when requested with min A to complete task.  Pt initiated lifting R and L foot when requested to facilitate threading pants.  Pt required assistance to complete task.  Pt transferred to w/c with [name] Move.  Pt much more alert during this session.  Pt turned head when spoken to and attempted to answer yes/no questions but was unable to answer questions about pain, etc.  Pt continued to exhibit increased BUE tone when attempting to initiate functional tasks.  Pt stated that he "l[place] the [place]."   Session 2 Pt exhibited no s/s of pain Individual therapy  Pt sitting in w/c upon arrival watching [place] game.  Pt transitioned to therapy gym and engaged in BUE activities with focus on reaching for cup and bring cup to mouth.  Pt initiated all movements and was able to partially bring cup up to mouth once cup was placed in hand.  Pt practiced task X 5 with each hand.  Pt again alert throughout session and followed or attempted to follow all one step commands. Pt stated that his girlfriend's name was [name] and smiled after stating her name.  Pt's O2 sats >90% throughout session.  Pt remained in w/c at end of session with family present.  Juan [name] [facility] 02/18/2015, 1:50 PM

## 2015-02-19 ENCOUNTER — Inpatient Hospital Stay (HOSPITAL_COMMUNITY): Payer: 59 | Admitting: Physical Therapy

## 2015-02-19 ENCOUNTER — Inpatient Hospital Stay (HOSPITAL_COMMUNITY): Payer: Self-pay | Admitting: Physical Therapy

## 2015-02-19 ENCOUNTER — Inpatient Hospital Stay (HOSPITAL_COMMUNITY): Payer: 59

## 2015-02-19 DIAGNOSIS — S3282XS Multiple fractures of pelvis without disruption of pelvic ring, sequela: Secondary | ICD-10-CM

## 2015-02-19 LAB — CULTURE, BLOOD (ROUTINE X 2)
Culture: NO GROWTH
Culture: NO GROWTH

## 2015-02-19 MED ORDER — FENTANYL 25 MCG/HR TD PT72
25.0000 ug | MEDICATED_PATCH | TRANSDERMAL | Status: DC
  Administered 2015-02-19 – 2015-02-22 (×2): 25 ug via TRANSDERMAL
  Filled 2015-02-19 (×2): qty 1

## 2015-02-19 NOTE — Progress Notes (Signed)
[place] PHYSICAL MEDICINE & REHABILITATION     PROGRESS NOTE    Subjective/Complaints: Had an excellent weekend. More responsive and communicating. Some emotions over weekend. Had a good night last night. Slept well.   ROS --cannot perform due to cognitive status  Objective: Vital Signs: Blood pressure 105/71, pulse 86, temperature 99 F (37.2 C), temperature source Oral, resp. rate 18, height  (1.676 m), weight 68.5 kg (151 lb 0.2 oz), SpO2 97 %. No results found. No results for input(s): WBC, HGB, HCT, PLT in the last 72 hours. No results for input(s): NA, K, CL, GLUCOSE, BUN, CREATININE, CALCIUM in the last 72 hours.  Invalid input(s): CO CBG (last 3)  No results for input(s): GLUCAP in the last 72 hours.  Wt Readings from Last 3 Encounters:  02/18/15 68.5 kg (151 lb 0.2 oz)  02/12/15 73.936 kg (163 lb)  07/31/12 63.504 kg (140 lb)    Physical Exam:  Constitutional: He appears well-developed and well-nourished.  Alert.  HENT:  Head: Normocephalic and atraumatic.  Right Ear: External ear normal.  Left Ear: External ear normal.  Mild thrush on tongue  Eyes: Right eye exhibits no discharge. Left eye exhibits no discharge. No scleral icterus. EOMI.  Neck: Normal range of motion. Neck supple. No JVD present. No tracheal deviation present. No thyromegaly present.  #4 tracheostomy tube in place--and plugged- Cardiovascular: Normal rate and regular rhythm.  Respiratory: Effort normal and breath sounds normal. No respiratory distress. He has no wheezes. i heard no rales GI: Soft. Bowel sounds are normal. He exhibits no distension. There is no tenderness. There is no rebound and no guarding.  PEG tube in place clean and dry  Genitourinary:  Indwelling Foley catheter tube in place  Musculoskeletal: He exhibits no edema.  Neurological: Increased muscle tone in b/l UE.   Flexor synergy pattern in both UE.   Skin:  Numerous surgical scars on both LE's. All wounds  essentially healed save for a scab or two. Seems to have intact PROM of LE's.  Psychiatric:  Remains internally distracted but able to focus better. Answers simple questions with repetition and cueing.  Assessment/Plan: 1. 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 Physiatrist is providing close team supervision and 24 hour management of active medical problems listed below. Physiatrist and rehab team continue to assess barriers to discharge/monitor patient progress toward functional and medical goals.  Function:  Bathing Bathing position   Position: Bed  Bathing parts   Body parts bathed by helper: Right arm, Left arm, Chest, Abdomen, Front perineal area, Right upper leg, Buttocks, Left upper leg, Right lower leg, Left lower leg, Back  Bathing assist Assist Level: 2 helpers (Total assist)      Upper Body Dressing/Undressing Upper body dressing   What is the patient wearing?: Hospital gown       Pull over shirt/dress - Perfomed by helper: Thread/unthread right sleeve, Thread/unthread left sleeve, Put head through opening, Pull shirt over trunk        Upper body assist        Lower Body Dressing/Undressing Lower body dressing   What is the patient wearing?: Hospital Gown       Pants- Performed by helper: Thread/unthread right pants leg, Thread/unthread left pants leg, Pull pants up/down, Fasten/unfasten pants   Non-skid slipper socks- Performed by helper: Don/doff right sock, Don/doff left sock  Lower body assist Assist Level: 2 Designer, multimedia activity did not occur: Safety/medical concerns        Toileting assist     Transfers Chair/bed transfer   Chair/bed transfer method: Lateral scoot Chair/bed transfer assist level: 2 helpers Chair/bed transfer assistive device: Sliding board Mechanical lift:  Maximove   Locomotion Ambulation Ambulation activity did not occur: Safety/medical concerns (BLE NWB)         Wheelchair Wheelchair activity did not occur: Safety/medical concerns (low level TBI) Type: Manual   Assist Level: Dependent (Pt equals 0%)  Cognition Comprehension Comprehension assist level: Understands basic 90% of the time/cues < 10% of the time  Expression Expression assist level: Expresses basis less than 25% of the time/requires cueing >75% of the time.  Social Interaction Social Interaction assist level: Interacts appropriately less than 25% of the time. May be withdrawn or combative.  Problem Solving Problem solving assist level: Solves basic less than 25% of the time - needs direction nearly all the time or does not effectively solve problems and may need a restraint for safety  Memory Memory assist level: Recognizes or recalls less than 25% of the time/requires cueing greater than 75% of the time   Medical Problem List and Plan: 1. 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 Activity tolerance improving also   .  2. DVT Prophylaxis/Anticoagulation: Subcutaneous Lovenox as well as IVC filter 01/12/2015---continue 3. Pain Management: Baclofen 10 mg 3 times a day, fentanyl patch reduced to 50 g every 72 hours,Hycet as neede 4. Mood/behavior/cognition: increase ritalin to .  Inderal 100 mg 3 times a day,   Klonopin 1.5 mg twice a day.  -sleep much improved  -continue seroquel to qhs  -continue sleep chart  -reduce day time neuro-sedating meds as possible  -removed boots/ changed to regular bed 5. Neuropsych: This patient is not capable of making decisions on his own behalf. 6. Skin/Wound Care: Routine skin checks 7. Fluids/Electrolytes/Nutrition: continue to monitor labs.  8. Uncontrollable rhythmic movements/spasticity.  Question related to anti-psychotic medications and adjustments made. 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 Large tension pneumothorax on the right with multiple rib fractures/pulmonary  -?LLL pneumonia---CXR improved, clinically improved also  -continue levaquin for now given low grade temp (99)  -decannulate 12. Multiple orthopedic fractures, L1-2 transverse process fractures,/left femur fracture with IM nailing, left and right open tibia fibular fractures, transverse acetabulum fracture status post percutaneous pinning,CM pelvic ring status post ORIF, pubic symphysis and trans-sacral screw forL S I diastasis and right sacralalo fracture. Nonweightbearing bilateral lower extremities 8 weeks until 03/17/2015 13. Urine retention/neurogenic bladder: flomax trial  I/O CATH prn  -encourage spontaneous voids as possible   LOS (Days) 6 A FACE TO FACE EVALUATION WAS PERFORMED  SWARTZ,ZACHARY T 02/19/2015 9:17 AM

## 2015-02-19 NOTE — Progress Notes (Signed)
Occupational Therapy Note  Patient Details  Name: [name] MRN: [id number] Date of Birth: 1990/07/30  Today's Date: 02/19/2015 OT Individual Time: 1400-1430 OT Individual Time Calculation (min): 30 min   Pt did not exhibit s/s of pain Individual Therapy  Pt resting in w/c upon arrival.  Pt noted with increased alertness this afternoon.  Pt engaged in grasp/release activities with L hand.  Pt requires max A to release objects (cup and golf ball). Pt practiced grasping a cup and bring it to his mouth in preparation for when diet upgraded.  Pt requires max A to complete task.  Pt smiled and laughed appropriately throughout session and initiated interaction with therapist and Rehab Tech. Focus on task initiation, BUE use for functional tasks, orientation, and activity tolerance.   [name] [facility] 02/19/2015, 3:01 PM

## 2015-02-19 NOTE — Progress Notes (Signed)
Physical Therapy Note  Patient Details  Name: Juan [name] MRN: [id number] Date of Birth: [birth year] Today's Date: 02/19/2015    Time: 1300-1400 60 minutes  1:1 No signs/symptoms of pain.  [name] move from bed to w/c, pt unable to purposefully assist with rolling to position sling.  Pt engaged in various tasks to elicit purposeful motor response.  Pt able to initiate movement with shoulders and trunk toward ball when presented with ball. Pt's UEs limited by increased tone bilaterally.  Pt responds to ball purposefully 2/10 trials.  Ball kick with pt able to kick purposefully with R LE 5/12 trials.  Pt reacts to lotion put on R LE and R UE, no reaction to L extremities.  Pt requires max cuing for initiation and total A to rub in lotion on UEs and LEs.   Attempt to have pt assist with washing face, pt with no purposeful initiation for this task.  Pt requires more rests and increased verbal, visual and tactile cues and more fatigued.  Pt most engaged and expressive with talking about football.  He was able to state he likes the "[employer]" and that they "won" this week.     DONAWERTH,KAREN 02/19/2015, 3:03 PM

## 2015-02-19 NOTE — Progress Notes (Signed)
Patient trach removed per MD order.  Protective gauze placed over stoma.  Patient tolerated well.  Will continue to monitor.

## 2015-02-19 NOTE — Progress Notes (Signed)
Speech Language Pathology Daily Session Note  Patient Details  Name: Juan [name] MRN: [id number] Date of Birth: 11-14-1990  Today's Date: 02/19/2015 SLP Individual Time: 0900-1000 SLP Individual Time Calculation (min): 60 min  Short Term Goals: Week 1: SLP Short Term Goal 1 (Week 1): Patient will tolerate PMSV for ~30 minutes without s/s of distress and all vitals remaining WFL. SLP Short Term Goal 2 (Week 1): Patient will respond to yes/no questions while utilizing gestures with Max A multimodal cues in 20% of opportunities. SLP Short Term Goal 3 (Week 1): Patient will follow basic 1 step directions in 25% of opportunities with Max A multimodal cues.  SLP Short Term Goal 4 (Week 1): Patient will focus attention to a task for 30 seconds with Max A multimodal cues.  SLP Short Term Goal 5 (Week 1): Patient will consume trials of ice chips without overt s/s of aspiration with Max A multimodal cues over 3 sessions.   Skilled Therapeutic Interventions: Skilled treatment session focused on dysphagia and cognitive goals. Upon arrival, patient was awake with his legs hanging over the side of the bed with his family reporting that "he wants to get up." Patient's trach is currently plugged with his vitals remaining WFL and plans for decannulation today. Patient rolled to the left with Mod I but required Max A multimodal cues to roll to the left to place sling due to perseveration. Patient was transferred to the wheelchair via the [facility] with +2 assist for safety.  Patient with increased verbalizations at the word and phrase level, however, patient continues to whisper and is unable to phonate despite Max A multimodal cues. Patient was independently oriented to the name of the hospital and required total A for orientation to city. Patient also recalled his birthday is tomorrow with question cues and required total A for orientation to his age.  Patient consumed trials of ice chips with hand over hand  assist and demonstrated what appeared to be a timely swallow initiation without overt s/s of aspiration. Patient consumed trials of thin liquids via tsp and demonstrated what appeared to be delayed swallow initiation due to delayed AP transit due to suspected perseveration and attempting to masticate the liquid. Patient also consumed trials of Dys. 1 textures and utilized multiple swallows without overt s/s of aspiration.  Patient left upright in wheelchair with family. Continue with current plan of care.    Function:  Eating Eating     Eating Assist Level: Helper feeds patient;Supervision or verbal cues;Helper scoops food on utensil (with trials from SLP )     Helper Scoops Food on Utensil: Every scoop     Cognition Comprehension Comprehension assist level: Understands basic 50 - 74% of the time/ requires cueing 25 - 49% of the time  Expression   Expression assist level: Expresses basis less than 25% of the time/requires cueing >75% of the time.  Social Interaction Social Interaction assist level: Interacts appropriately less than 25% of the time. May be withdrawn or combative.  Problem Solving Problem solving assist level: Solves basic less than 25% of the time - needs direction nearly all the time or does not effectively solve problems and may need a restraint for safety  Memory Memory assist level: Recognizes or recalls less than 25% of the time/requires cueing greater than 75% of the time    Pain Pain Assessment Pain Assessment: No/denies pain  Therapy/Group: Individual Therapy  Marycruz Boehner 02/19/2015, 10:56 AM

## 2015-02-19 NOTE — Progress Notes (Signed)
Patient family refused hs bath on 02-17-15.

## 2015-02-19 NOTE — Progress Notes (Signed)
Occupational Therapy Session Note  Patient Details  Name: Juan [name] MRN: [id number] Date of Birth: 12/25/1990  Today's Date: 02/19/2015 OT Individual Time: 1000-1100 OT Individual Time Calculation (min): 60 min    Short Term Goals: Week 1:  OT Short Term Goal 1 (Week 1): Pt will tolerate sitting in upright postion in chair for 1.5 with appropriate sitting system OT Short Term Goal 2 (Week 1): Pt will visually track to stimulus 75% of time with extra time.  OT Short Term Goal 3 (Week 1): Pt will demonstrate focused attention 5 seconds with max A with extra time in prep for functional task  Skilled Therapeutic Interventions/Progress Updates:    Pt resting in w/c upon arrival with mother and girlfriend present. Focus on orientation, following one step commands, task initiation, initiating BUE for functional tasks, activity tolerance.  When asked yes/no question if he was in the hospital pt did not offer a yes or no.  Pt has not responded to yes/no questions with this therapist.  Pt was able to state that tomorrow is his birthday but was unable to state his age.  When offered a choice of two ages, pt was unable to state age.  Pt initiate BUE to reach for objects but is presently unable to overcome flexor tone to extend elbows.  Pt visually tracks to stimulus consistently throughout sessions and demonstrates focused attention approx 30 seconds with min A.  Therapy Documentation Precautions:  Precautions Precautions: Fall, Other (comment) Precaution Comments: BLE NWB; trach, peg Required Braces or Orthoses: Other Brace/Splint Other Brace/Splint: B resting hand splints; B PRAFO Restrictions Weight Bearing Restrictions: Yes RLE Weight Bearing: Non weight bearing LLE Weight Bearing: Non weight bearing Pain: Pain Assessment Pain Assessment: No/denies pain ADL: ADL ADL Comments: see FIM  See Function Navigator for Current Functional Status.   Therapy/Group: Individual  Therapy  [name] 02/19/2015, 11:02 AM

## 2015-02-20 ENCOUNTER — Inpatient Hospital Stay (HOSPITAL_COMMUNITY): Payer: Self-pay | Admitting: Physical Therapy

## 2015-02-20 ENCOUNTER — Inpatient Hospital Stay (HOSPITAL_COMMUNITY): Payer: 59 | Admitting: Physical Therapy

## 2015-02-20 ENCOUNTER — Inpatient Hospital Stay (HOSPITAL_COMMUNITY): Payer: 59 | Admitting: Occupational Therapy

## 2015-02-20 ENCOUNTER — Inpatient Hospital Stay (HOSPITAL_COMMUNITY): Payer: 59 | Admitting: Speech Pathology

## 2015-02-20 ENCOUNTER — Inpatient Hospital Stay (HOSPITAL_COMMUNITY): Payer: 59

## 2015-02-20 LAB — GLUCOSE, CAPILLARY: Glucose-Capillary: 102 mg/dL — ABNORMAL HIGH (ref 65–99)

## 2015-02-20 MED ORDER — IOHEXOL 300 MG/ML  SOLN
50.0000 mL | Freq: Once | INTRAMUSCULAR | Status: DC | PRN
  Administered 2015-02-20: 50 mL via ORAL
  Filled 2015-02-20: qty 50

## 2015-02-20 MED ORDER — LACOSAMIDE 50 MG PO TABS
200.0000 mg | ORAL_TABLET | Freq: Two times a day (BID) | ORAL | Status: DC
  Administered 2015-02-20 – 2015-03-20 (×55): 200 mg via ORAL
  Filled 2015-02-20 (×56): qty 4

## 2015-02-20 MED ORDER — FREE WATER
200.0000 mL | Freq: Three times a day (TID) | Status: DC
  Administered 2015-02-20 – 2015-02-26 (×18): 200 mL

## 2015-02-20 MED ORDER — JEVITY 1.2 CAL PO LIQD
ORAL | Status: AC
  Filled 2015-02-20: qty 237

## 2015-02-20 MED ORDER — CLONAZEPAM 0.5 MG PO TABS
1.0000 mg | ORAL_TABLET | Freq: Two times a day (BID) | ORAL | Status: DC
  Administered 2015-02-21 – 2015-02-28 (×16): 1 mg
  Filled 2015-02-20 (×16): qty 2

## 2015-02-20 MED ORDER — BETHANECHOL CHLORIDE 10 MG PO TABS
10.0000 mg | ORAL_TABLET | Freq: Three times a day (TID) | ORAL | Status: DC
  Administered 2015-02-20 – 2015-02-28 (×24): 10 mg
  Filled 2015-02-20 (×24): qty 1

## 2015-02-20 MED ORDER — LACOSAMIDE 10 MG/ML PO SOLN: 200.0000 mg | Freq: Two times a day (BID) | ORAL | Status: DC

## 2015-02-20 MED ORDER — JEVITY 1.2 CAL PO LIQD
340.0000 mL | Freq: Every day | ORAL | Status: DC
  Administered 2015-02-21 (×2): 340 mL
  Administered 2015-02-21: 250 mL
  Administered 2015-02-21: 200 mL
  Administered 2015-02-21: 150 mL
  Administered 2015-02-22 – 2015-02-23 (×5): 340 mL

## 2015-02-20 MED ORDER — PRO-STAT SUGAR FREE PO LIQD
30.0000 mL | Freq: Three times a day (TID) | ORAL | Status: DC
  Administered 2015-02-21 – 2015-03-01 (×26): 30 mL
  Filled 2015-02-20 (×27): qty 30

## 2015-02-20 MED ORDER — FREE WATER: 200.0000 mL | Freq: Three times a day (TID) | Status: DC
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
## 2015-02-20 NOTE — Progress Notes (Signed)
Speech Language Pathology Daily Session Note  Patient Details  Name: Juan [name] MRN: [id number] Date of Birth: May 15, 1991  Today's Date: 02/20/2015 SLP Individual Time: 1030-1100 SLP Individual Time Calculation (min): 30 min  Short Term Goals: Week 1: SLP Short Term Goal 1 (Week 1): Patient will tolerate PMSV for ~30 minutes without s/s of distress and all vitals remaining WFL. SLP Short Term Goal 2 (Week 1): Patient will respond to yes/no questions while utilizing gestures with Max A multimodal cues in 20% of opportunities. SLP Short Term Goal 3 (Week 1): Patient will follow basic 1 step directions in 25% of opportunities with Max A multimodal cues.  SLP Short Term Goal 4 (Week 1): Patient will focus attention to a task for 30 seconds with Max A multimodal cues.  SLP Short Term Goal 5 (Week 1): Patient will consume trials of ice chips without overt s/s of aspiration with Max A multimodal cues over 3 sessions.   Skilled Therapeutic Interventions: Skilled treatment session focused on dysphagia and cognitive goals. Upon arrival, patient was sitting upright in the wheelchair with family present and is now decannulated. Patient appeared restless throughout the session and required Max A multimodal cues for optimal positioning in the wheelchair. Patient consumed trials of thin liquids via teaspoon and continues to demonstrate "mastication" of liquids with what appears to be a delayed swallow initiation and a delayed and weak cough X 1. Patient verbalized at the word and phrase level in 75% of opportunities but continues to be aphonic. Patient handed off to OT. Continue with current plan of care.    Function:  Eating Eating   Modified Consistency Diet: Yes Eating Assist Level: Helper feeds patient (with trials from SLP)           Cognition Comprehension Comprehension assist level: Understands basic 25 - 49% of the time/ requires cueing 50 - 75% of the time  Expression   Expression  assist level: Expresses basis less than 25% of the time/requires cueing >75% of the time.  Social Interaction Social Interaction assist level: Interacts appropriately less than 25% of the time. May be withdrawn or combative.  Problem Solving Problem solving assist level: Solves basic less than 25% of the time - needs direction nearly all the time or does not effectively solve problems and may need a restraint for safety  Memory Memory assist level: Recognizes or recalls less than 25% of the time/requires cueing greater than 75% of the time    Pain Pain Assessment Pain Assessment: No/denies pain Faces Pain Scale: No hurt  Therapy/Group: Individual Therapy  Burlon Centrella 02/20/2015, 2:43 PM
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
## 2015-02-20 NOTE — Progress Notes (Addendum)
Physical Therapy Session Note  Patient Details  Name: Juan [name] MRN: [id number] Date of Birth: Dec 18, 1990  Today's Date: 02/20/2015 PT Individual Time: 0900-1000 and 1515-1545 PT Individual Time Calculation (min): 60 min and 30 min   Short Term Goals: Week 1:  PT Short Term Goal 1 (Week 1): Patient will demonstrate focused attention x 30 sec with max multimodal cues.  PT Short Term Goal 2 (Week 1): Patient will tolerate OOB activity in upright position x 2 hours with supervision.  PT Short Term Goal 3 (Week 1): Patient will follow basic one step commands 25% of available opportunities with max multimodal cues.   Skilled Therapeutic Interventions/Progress Updates:   Treatment 1: Focus on eliciting purposeful movement, initiation, attention, sitting balance, functional transfers, and cognition. Patient supine in bed, restless and agreeable to sitting up. Patient tolerated static/dynamic sitting edge of bed x approx 40 min with max-total A x 1 and another therapist positioned in front of patient for +2A, requiring frequent repositioning due to hip/trunk extensor tone and constant truncal/head/BUE movement while engaging in reach/grasp/release tasks of familiar self care items received in gift basket for birthday (deodorant, shampoo, body wash, t-shirt) and opening/reviewing birthday cards. Patient appeared to perform purposeful movements and follow commands 75% of available opportunities with max multimodal cues and required HOH assist at times due to increased BUE tone and forward flexion with task initiation. Transferred to TIS wheelchair via slide board with +2A for safety. Patient's family educated on patient progress and notified that session ended.   Treatment 2: Focus on initiation of purposeful movement and cognition. Patient's significant other present for session. Patient transported outdoors and engaged in cognitive task reaching for colored floor plates and reading numbers. Patient  counted in Spanish ("[name], [name] tres") as floor plates list numerical number and number spelled out in [place] and [place]. When therapist attempted to change task to performing SAQ using BLE, patient continued to verbally perseverate on numbers and was unable to perform motor task despite max multimodal cuing. Patient responded appropriately to humor, laughing and smiling throughout session, but also verbally perseverated on a joke and had difficulty answering following social conversation questions. Patient left sitting in wheelchair with quick release belt on with significant other.   Therapy Documentation Precautions:  Precautions Precautions: Fall, Other (comment) Precaution Comments: BLE NWB; trach, peg Required Braces or Orthoses: Other Brace/Splint Other Brace/Splint: B resting hand splints; B PRAFO Restrictions Weight Bearing Restrictions: Yes RLE Weight Bearing: Non weight bearing LLE Weight Bearing: Non weight bearing Pain: Pain Assessment Pain Assessment: Faces Faces Pain Scale: No hurt  See Function Navigator for Current Functional Status.   Therapy/Group: Individual Therapy  Kerney Elbe 02/20/2015, 12:43 PM
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
## 2015-02-20 NOTE — Plan of Care (Signed)
Problem: RH BLADDER ELIMINATION Goal: RH STG MANAGE BLADDER WITH ASSISTANCE STG Manage Bladder With maxx Assistance  Outcome: Not Progressing Urinary retention

## 2015-02-20 NOTE — Progress Notes (Addendum)
Speech Language Pathology Weekly Progress and Session Note  Patient Details  Name: Juan [name] MRN: [id number] Date of Birth: June 01, 1990  Beginning of progress report period: February 13, 2015 End of progress report period: February 20, 2015  Today's Date: 02/20/2015 SLP Co-Treatment Time: 1515 (co-tx with PT (RV) 1300-1330)-1530 SLP Co-Treatment Time Calculation (min): 15 min  Short Term Goals: Week 1: SLP Short Term Goal 1 (Week 1): Patient will tolerate PMSV for ~30 minutes without s/s of distress and all vitals remaining WFL. SLP Short Term Goal 1 - Progress (Week 1): Discontinued (comment) SLP Short Term Goal 2 (Week 1): Patient will respond to yes/no questions while utilizing gestures with Max A multimodal cues in 20% of opportunities. SLP Short Term Goal 2 - Progress (Week 1): Met SLP Short Term Goal 3 (Week 1): Patient will follow basic 1 step directions in 25% of opportunities with Max A multimodal cues.  SLP Short Term Goal 3 - Progress (Week 1): Met SLP Short Term Goal 4 (Week 1): Patient will focus attention to a task for 30 seconds with Max A multimodal cues.  SLP Short Term Goal 4 - Progress (Week 1): Met SLP Short Term Goal 5 (Week 1): Patient will consume trials of ice chips without overt s/s of aspiration with Max A multimodal cues over 3 sessions.  SLP Short Term Goal 5 - Progress (Week 1): Met    New Short Term Goals: Week 2: SLP Short Term Goal 1 (Week 2): Patient will consume trials of Dys. 1 textures without overt s/s of aspiration over 3 consecutive sesions with Max A multimodal cues.  SLP Short Term Goal 2 (Week 2): Patient will consume trials of thin liquids with minimal overt s/s of aspiration with Max A multimodal cues.  SLP Short Term Goal 3 (Week 2): Patient will express basic wants/needs in 75% of opportunities with Max A multimodal cues.  SLP Short Term Goal 4 (Week 2): Patient will vocalize with Max A multimodal cues in 25% of opportunities  SLP Short  Term Goal 5 (Week 2): Patient will initiate functional tasks in 50% of opportunities with Max A multimodal cues.  SLP Short Term Goal 6 (Week 2): Patient will sustain attention to a task for 60 seconds with Max A multimodal cues.   Weekly Progress Updates: Patient has made functional gains and has met 5 of 5 STG's this reporting period due to increased cognitive-linguistic function. Currently, patient remains NPO with a PEG but has been participating in trials of Dys. 1 textures, ice chips and thin liquids via tsp with minimal overt s/s of aspiration. Recommend an objective swallow assessment in order to assess dysphagia within the next few days.  Patient is verbalizing at the word and phrase level in response to questions in 25-50% of opportunities but remains aphonic, question cognition vs. vocal cord dysfunction.  Patient is also able to initiate and follow 1 step commands in 25% of opportunities with extra time. However, function is impacted by perseveration. Patient also demonstrates increased focused attention to functional tasks and is inconsistently oriented to place. Family education is ongoing. Patient would benefit from continued skilled SLP intervention to maximize swallowing and cognitive function and functional communication prior to discharge. Continue with current plan of care.    Intensity: Minumum of 1-2 x/day, 30 to 90 minutes Frequency: 3 to 5 out of 7 days Duration/Length of Stay: 03/20/15 Treatment/Interventions: English as a second language teacher;Dysphagia/aspiration precaution training;Environmental controls;Functional tasks;Therapeutic Activities;Patient/family education;Cognitive remediation/compensation;Internal/external aids;Speech/Language facilitation   Daily Session  Skilled  Therapeutic Interventions: Skilled co-treatment session with PT focused on cognitive goals. Upon arrival, patient was restless in bed with family present reporting, "I want to get up." Patient sat EOB and continued to  demonstrate restlessness and impulsivity with movements, therefore, patient transferred to the wheelchair via slide board with +2 assist for safety. Patient continued to demonstrate restlessness while sitting upright in the wheelchair with constant movement and appeared to be verbally frustrated demonstrated by using profanity while grimacing. Patient appeared to relax when being propelled in the wheelchair. While moving, patient verbalized "woah" and reported, he felt like he was going to  "pass out." Vitals were obtained and were [facility] and RN made aware. Suspect increased restlessness was due to constant over stimulation today (constant family members due to it being his birthday, taking a shower for the first time, etc) and patient's family educated in regards to strategies to utilize to decrease stimulation and overall restlessness. They all verbalized understanding. Patient left reclined in wheelchair with mother present. Continue with current plan of care.     Function:   Cognition Comprehension Comprehension assist level: Understands basic 25 - 49% of the time/ requires cueing 50 - 75% of the time  Expression   Expression assist level: Expresses basis less than 25% of the time/requires cueing >75% of the time.  Social Interaction Social Interaction assist level: Interacts appropriately less than 25% of the time. May be withdrawn or combative.  Problem Solving Problem solving assist level: Solves basic less than 25% of the time - needs direction nearly all the time or does not effectively solve problems and may need a restraint for safety  Memory Memory assist level: Recognizes or recalls less than 25% of the time/requires cueing greater than 75% of the time    Pain Pain Assessment Pain Assessment: No/denies pain Faces Pain Scale: No hurt  Therapy/Group: Individual Therapy  Birdie Fetty 02/20/2015, 4:08 PM

## 2015-02-20 NOTE — Progress Notes (Signed)
Late entry: At 1500, nurse was called to pt's room, and it was reported that pt's significant other had inadvertently dislodged, pt's peg tube. Foley catheter placed to ensure opening of peg site, per verbal order from Harvel Ricks, [place]. Tube checked for placement, swooshing sound heard. At 1845, pt able to tolerate a small amount of water through tube, however, unable to tolerate tube feeding. Pt lying flat during administration of bolus feeding, turning from side to side, uncontrollably. Immediately after bolus given, pt vomited up complete bolus feeding.   Jacalyn Lefevre, PA notified. Order given for Portable 1 view of the abdomen w/contrast to check placement. Pt resting comfortably after emesis episode, with significant other at bedside. HOB  degrees. Nursing staff to continue to monitor.

## 2015-02-20 NOTE — Progress Notes (Addendum)
Physical Therapy Session Note  Patient Details  Name: Juan [name] MRN: [id number] Date of Birth: 06-Aug-1990  Today's Date: 02/20/2015 PT Co-Treatment Time: 1300-1315 (full co-tx 1300-1330) PT Co-Treatment Time Calculation (min): 15 min (full co-tx 30 min)  Short Term Goals: Week 1:  PT Short Term Goal 1 (Week 1): Patient will demonstrate focused attention x 30 sec with max multimodal cues.  PT Short Term Goal 2 (Week 1): Patient will tolerate OOB activity in upright position x 2 hours with supervision.  PT Short Term Goal 3 (Week 1): Patient will follow basic one step commands 25% of available opportunities with max multimodal cues.   Skilled Therapeutic Interventions/Progress Updates:   Skilled co-treatment with primary SLP to address sitting tolerance and cognition. Patient received restless in bed with family present, stating he "wanted to get up." Transferred to edge of bed with max A, patient with continued restlessness and decreased safety with sitting balance with +2A, therefore transferred to TIS wheelchair via slide board with +2A. In wheelchair in tilted and upright positions, patient with continued restless, constant movement that did improve somewhat when being pushed throughout rehab unit in wheelchair. Patient appeared verbally frustrated throughout session as well, grimacing and appearing in heightened state emotionally. After being pushed in wheelchair, patient stated he felt like he was going to "pass out," seated BP 113/67, HR 90, Sp02 100% on room air, RN/NT notified. Family educated on strategies to decrease stimulation and restlessness, verbalized understanding. Patient left in wheelchair with quick release belt on and room lights off and door closed with mother present.     Therapy Documentation Precautions:  Precautions Precautions: Fall, Other (comment) Precaution Comments: BLE NWB; trach, peg Required Braces or Orthoses: Other Brace/Splint Other Brace/Splint: B  resting hand splints; B PRAFO Restrictions Weight Bearing Restrictions: Yes RLE Weight Bearing: Non weight bearing LLE Weight Bearing: Non weight bearing Pain: Pain Assessment Pain Assessment: Faces Faces Pain Scale: No hurt   See Function Navigator for Current Functional Status.   Therapy/Group: Co-Treatment  Bayard Hugger A 02/20/2015, 2:29 PM

## 2015-02-20 NOTE — Progress Notes (Signed)
Occupational Therapy Session Note  Patient Details  Name: Juan [name] MRN: [id number] Date of Birth: [birth year]  Today's Date: 02/20/2015 OT Individual Time: 1100-1200 OT Individual Time Calculation (min): 60 min    Short Term Goals: Week 1:  OT Short Term Goal 1 (Week 1): Pt will tolerate sitting in upright postion in chair for 1.5 with appropriate sitting system OT Short Term Goal 2 (Week 1): Pt will visually track to stimulus 75% of time with extra time.  OT Short Term Goal 3 (Week 1): Pt will demonstrate focused attention 5 seconds with max A with extra time in prep for functional task  Skilled Therapeutic Interventions/Progress Updates:    Pt engaged in BADL retraining including bathing at shower level and dressing seated in shower chair and at bed level.  Focus on task initiating while seated in rolling shower chair.  Pt initiates use of BUE to assist with bathing and dressing tasks. Pt spoke to therapist upon arrival, stating "Hey Man" and was smiling throughout session.  Pt initiated rolling in bed to right and left but perseverated on task and continued to roll in bed after completing LB dressing.  Pt exhibited increased initiation of tasks upon request during bathing and dressing tasks.    Therapy Documentation Precautions:  Precautions Precautions: Fall, Other (comment) Precaution Comments: BLE NWB; trach, peg Required Braces or Orthoses: Other Brace/Splint Other Brace/Splint: B resting hand splints; B PRAFO Restrictions Weight Bearing Restrictions: Yes RLE Weight Bearing: Non weight bearing LLE Weight Bearing: Non weight bearing ADL: ADL ADL Comments: see FIM  See Function Navigator for Current Functional Status.   Therapy/Group: Individual Therapy  [name] 02/20/2015, 12:12 PM

## 2015-02-21 ENCOUNTER — Inpatient Hospital Stay (HOSPITAL_COMMUNITY): Payer: 59 | Admitting: Physical Therapy

## 2015-02-21 ENCOUNTER — Inpatient Hospital Stay (HOSPITAL_COMMUNITY): Payer: 59 | Admitting: Occupational Therapy

## 2015-02-21 ENCOUNTER — Inpatient Hospital Stay (HOSPITAL_COMMUNITY): Payer: Self-pay | Admitting: Occupational Therapy

## 2015-02-21 MED ORDER — JEVITY 1.2 CAL PO LIQD
ORAL | Status: AC
  Filled 2015-02-21: qty 237

## 2015-02-21 NOTE — Progress Notes (Signed)
Occupational Therapy Session Note  Patient Details  Name: Juan [name] MRN: [id number] Date of Birth: [birth year]  Today's Date: 02/21/2015 OT Individual Time: 0900-1000 OT Individual Time Calculation (min): 60 min    Short Term Goals: Week 1:  OT Short Term Goal 1 (Week 1): Pt will tolerate sitting in upright postion in chair for 1.5 with appropriate sitting system OT Short Term Goal 2 (Week 1): Pt will visually track to stimulus 75% of time with extra time.  OT Short Term Goal 3 (Week 1): Pt will demonstrate focused attention 5 seconds with max A with extra time in prep for functional task  Skilled Therapeutic Interventions/Progress Updates:    1:1 Pt supine in bed when arrived. Focus on LB dressing (depend and shorts) in supine with focus on following one step directions to thread pants and roll for pulling up pants. Pt with ongoing reflexive rolling requiring total tactile cues to terminate. Transition to EOB with tactile cues for pt to push up from sidlying to sitting EOM. Focus on sustaining upright trunk position with focused to sustained attention. Pt able to demonstrate sitting balance with mod to max A with extra time to process commands. Therapist sitting in front of pt with pt's hands on their knees focus on gross motor commands and weight bearing for bilateral UEs to to achieve forward posture. Pt able to count to 10 2x with more than reasonable amt of time with a whispering voice.  Pt demonstrate active movement to thread UEs in shirt once setup but still requires total A to don but assisting with each step of donning with extra time and tactile cues to guide hands with gross UE movements.  Transfer via slide board to tilt in space w/c with extra time and focus on maintain forward weight shift over therapist's back; performed from elevated surface to maintain nonweightbearing status. Transition to sink and participated in oral care, shaving and washing face.  Pt demonstrating more  automatic movements to complete/ participate in purposeful tasks. Pt able to answer questions 50% of time in short phrases in a whispering voice.  Left pt with mom at end of session to rest.   Therapy Documentation Precautions:  Precautions Precautions: Fall, Other (comment) Precaution Comments: BLE NWB; trach, peg Required Braces or Orthoses: Other Brace/Splint Other Brace/Splint: B resting hand splints; B PRAFO Restrictions Weight Bearing Restrictions: Yes RLE Weight Bearing: Non weight bearing LLE Weight Bearing: Non weight bearing Pain: Pain Assessment Pain Assessment: No/denies pain ADL: ADL ADL Comments: see FIM  See Function Navigator for Current Functional Status.   Therapy/Group: Individual Therapy  Roney Mans [facility] 02/21/2015, 3:29 PM

## 2015-02-21 NOTE — Progress Notes (Signed)
Physical Therapy Weekly Progress Note  Patient Details  Name: Juan [name] MRN: [id number] Date of Birth: April 27, 1991  Beginning of progress report period: February 14, 2015 End of progress report period: February 21, 2015  Today's Date: 02/21/2015 PT Individual Time: 1400-1454 PT Individual Time Calculation (min): 54 min   Patient has met 3 of 3 short term goals.  Patient has made functional gains this reporting period due to improved attention. Patient is able to follow basic functional one step commands 50% of available opportunities and responds to questions 25-50% of time with increased time in whispering voice with extra time. Patient is inconsistently oriented, able to focus attention for brief periods of time to functional tasks, and continues to demonstrate fluctuating restless movement and increased tone limiting functional use of BUE requiring +2A for safety with sitting balance and slide board transfers. Patient's function with mobility and cognitive tasks appears limited by perseveration. Family, specifically patient's mother and grandmother, are consistently present and supportive and family education is ongoing.     Patient continues to demonstrate the following deficits: muscle weakness, muscle joint tightness, decreased endurance, impaired timing and sequencing, abnormal tone, unbalanced muscle activation, motor apraxia, ataxia, decreased coordination, decreased motor planning, decreased midline orientation, decreased initiation, decreased attention, decreased awareness, decreased problem solving, decreased safety awareness, decreased memory, delayed processing, decreased sitting balance, decreased postural control, decreased balance strategies and difficulty maintaining precautions and therefore will continue to benefit from skilled PT intervention to enhance overall performance with activity tolerance, balance, postural control, ability to compensate for deficits, functional use of   right upper extremity, right lower extremity, left upper extremity and left lower extremity, attention, awareness, coordination and knowledge of precautions.  Patient progressing toward long term goals.  Continue plan of care.  PT Short Term Goals Week 1:  PT Short Term Goal 1 (Week 1): Patient will demonstrate focused attention x 30 sec with max multimodal cues.  PT Short Term Goal 1 - Progress (Week 1): Met PT Short Term Goal 2 (Week 1): Patient will tolerate OOB activity in upright position x 2 hours with supervision.  PT Short Term Goal 2 - Progress (Week 1): Met PT Short Term Goal 3 (Week 1): Patient will follow basic one step commands 25% of available opportunities with max multimodal cues.  PT Short Term Goal 3 - Progress (Week 1): Met Week 2:  PT Short Term Goal 1 (Week 2): Patient will sustain attention to functional tasks x 60 sec with max multimodal cues.  PT Short Term Goal 2 (Week 2): Patient will initiate functional tasks 50% of available opportunities with max multimodal cues.  PT Short Term Goal 3 (Week 2): Patient will maintain sitting balance with assist of one person x 15 min.   Skilled Therapeutic Interventions/Progress Updates:   Session focused on focused attention, following one step commands, initiation of purposeful movement for functional tasks, and sitting balance. Patient's mother and grandmother present during 90 of session. Patient supine in bed, restless. Instructed in rolling to L and pushing trunk up to sitting with mod A, verbal/tactile cues for initiation and sequencing. Sitting edge of bed with rehab tech behind patient and therapist positioned in front of patient, focused on upright posture with verbal cues to "keep hands on my knees" to facilitate BUE WB and upright/forward posture due to patient tendency to lose balance posterior and to R. Patient able to count out loud with therapist but unable to continue counting independently. Patient instructed in  sitting > propped  on forearm to R and L x 10-15 each direction with min A overall to facilitate forward weight shift over UE in order to push up to sitting. Patient perseverative on propping to R side and required total tactile cues to achieve midline after propping on LUE and prevent patient from returning to R side. Patient asked, "What kind of game is this?" Education provided on goals and purpose of physical therapy. Patient answered biographical and social questions 75% of time with increased time and in whisper. Patient verbalized "YES" emphatically when asked if he would like to get in chair and leave room. Performed slide board transfer bed > wheelchair with total A and +2 for safety, verbal/tactile cues to facilitate forward weight shift. Patient pushed to gym to engage in cognitive task but unable to keep eyes open long enough to answer therapist's questions. Patient left sitting in wheelchair with quick release belt on with family in family room.   Therapy Documentation Precautions:  Precautions Precautions: Fall, Other (comment) Precaution Comments: BLE NWB; trach, peg Required Braces or Orthoses: Other Brace/Splint Other Brace/Splint: B resting hand splints; B PRAFO Restrictions Weight Bearing Restrictions: Yes RLE Weight Bearing: Non weight bearing LLE Weight Bearing: Non weight bearing General: PT Amount of Missed Time (min): 6 Minutes PT Missed Treatment Reason: Patient fatigue Pain: Pain Assessment Pain Assessment: No/denies pain   See Function Navigator for Current Functional Status.  Therapy/Group: Individual Therapy  Laretta Alstrom 02/21/2015, 3:58 PM

## 2015-02-21 NOTE — Progress Notes (Signed)
Patient gastrostomy tube last night and Foley tube replacement was inserted. Spoke with trauma services who inserted initial PEG tube 01/18/2015 and advised to continue tube for now and if any problems contact the replacement tube
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
## 2015-02-21 NOTE — Progress Notes (Signed)
Speech Language Pathology Daily Session Note  Patient Details  Name: Juan [name] MRN: [id number] Date of Birth: [birth year]  Today's Date: 02/21/2015 SLP Individual Time: 1100-1200 SLP Individual Time Calculation (min): 60 min  Short Term Goals: Week 2: SLP Short Term Goal 1 (Week 2): Patient will consume trials of Dys. 1 textures without overt s/s of aspiration over 3 consecutive sesions with Max A multimodal cues.  SLP Short Term Goal 2 (Week 2): Patient will consume trials of thin liquids with minimal overt s/s of aspiration with Max A multimodal cues.  SLP Short Term Goal 3 (Week 2): Patient will express basic wants/needs in 75% of opportunities with Max A multimodal cues.  SLP Short Term Goal 4 (Week 2): Patient will vocalize with Max A multimodal cues in 25% of opportunities  SLP Short Term Goal 5 (Week 2): Patient will initiate functional tasks in 50% of opportunities with Max A multimodal cues.  SLP Short Term Goal 6 (Week 2): Patient will sustain attention to a task for 60 seconds with Max A multimodal cues.   Skilled Therapeutic Interventions: Skilled treatment session focused on dysphagia and cognitive-linguistic goals. Upon arrival, patient was awake while sitting upright in the wheelchair. SLP facilitated session by providing oral care via the suction toothbrush. Patient self-fed trials of ice chips with hand over hand assist and demonstrated what appeared to be a timely swallow initiation without overt s/s of aspiration.  Patient was able to follow commands and answered questions in 75% of opportunities. Patient continues to be aphonic despite Max A multimodal cues and multiple attempts. Patient was also able to decode numbers and at the word level.  Patient demonstrated decreased restlessness compared to yesterday's session and was overall brighter. Patient left upright in wheelchair with mom present. Continue with current plan of care.    Function:  Eating Eating   Modified  Consistency Diet: Yes Eating Assist Level: Hand over hand assist (with trials from SLP )           Cognition Comprehension Comprehension assist level: Understands basic 25 - 49% of the time/ requires cueing 50 - 75% of the time  Expression   Expression assist level: Expresses basic 25 - 49% of the time/requires cueing 50 - 75% of the time. Uses single words/gestures.  Social Interaction Social Interaction assist level: Interacts appropriately less than 25% of the time. May be withdrawn or combative.  Problem Solving Problem solving assist level: Solves basic less than 25% of the time - needs direction nearly all the time or does not effectively solve problems and may need a restraint for safety  Memory Memory assist level: Recognizes or recalls less than 25% of the time/requires cueing greater than 75% of the time    Pain Pain Assessment Pain Assessment: No/denies pain  Therapy/Group: Individual Therapy  Marylan Glore 02/21/2015, 3:18 PM
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
## 2015-02-21 NOTE — Progress Notes (Signed)
Tube placement verified via xray. Pt alert and agitated. Meds given per tube. Bolus of 150 attempted since pt did not tolerate initial bolus well.

## 2015-02-21 NOTE — Progress Notes (Signed)
Social Work Patient ID: Juan [name], male   DOB: May 02, 1991, 24 y.o.   MRN: [id number]   Met with pt's mother yesterday afternoon to review team conference.  She is aware and agreeable with targeted d/c date of 11/1 with moderate assist goals overall.  She and family are pleased with gains so far and remain optimistic and very involved in his care/ support.  Will continue to follow.  Lachelle Rissler, LCSW
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
## 2015-02-22 ENCOUNTER — Inpatient Hospital Stay (HOSPITAL_COMMUNITY): Payer: 59 | Admitting: Speech Pathology

## 2015-02-22 ENCOUNTER — Inpatient Hospital Stay (HOSPITAL_COMMUNITY): Payer: 59

## 2015-02-22 ENCOUNTER — Inpatient Hospital Stay (HOSPITAL_COMMUNITY): Payer: 59 | Admitting: Occupational Therapy

## 2015-02-22 ENCOUNTER — Inpatient Hospital Stay (HOSPITAL_COMMUNITY): Payer: 59 | Admitting: Physical Therapy

## 2015-02-22 MED ORDER — JEVITY 1.2 CAL PO LIQD
ORAL | Status: AC
  Filled 2015-02-22: qty 237

## 2015-02-22 MED ORDER — IOHEXOL 300 MG/ML  SOLN: 50.0000 mL | Freq: Once | INTRAMUSCULAR | Status: DC | PRN

## 2015-02-22 MED ORDER — JEVITY 1.2 CAL PO LIQD
ORAL | Status: AC
  Administered 2015-02-22: 18:00:00
  Filled 2015-02-22: qty 237

## 2015-02-22 NOTE — Progress Notes (Signed)
Physical Therapy Session Note  Patient Details  Name: Juan [name] MRN: [id number] Date of Birth: [birth year]  Today's Date: 02/22/2015 PT Individual Time 1300-1400 PT Individual Time Calculation (min): 60 min   Short Term Goals: Week 2:  PT Short Term Goal 1 (Week 2): Patient will sustain attention to functional tasks x 60 sec with max multimodal cues.  PT Short Term Goal 2 (Week 2): Patient will initiate functional tasks 50% of available opportunities with max multimodal cues.  PT Short Term Goal 3 (Week 2): Patient will maintain sitting balance with assist of one person x 15 min.   Skilled Therapeutic Interventions/Progress Updates:   Prior to session, patient's mother indicated patient was not feeling well due to pain related to PEG re-placement. Patient sidelying in bed on arrival, responded that he felt "pretty shitty," and was in a "bad mood." Upon transferring supine > sit, nozzle of PEG tube noted to be open and leaked on patient's clothing. Total A to doff soiled/don clean shirt with patient initiating to assist with dressing task. Performed slide board transfers bed <> wheelchair with total A of one person, +2 for safety. When asked what would help, patient requested "a pepsi" but was agreeable to treatment outdoors. Patient pushed outside in TIS wheelchair. After leaving rehab unit, patient stopped answering this clinician's questions and followed commands <10% of available opportunities. Patient noted to maintain static sitting balance in wheelchair positioned in full upright position while maintaining head at midline (off head rest) and no restless movement noted with decreased tone. Therapist able to place patient's hands on knees with no increase in flexor tone or grasp reflex. Upon return to rehab unit, patient returned to bed and remained still in supine for procedure. Family notified of patient's mood and termination of session.   Therapy Documentation Precautions:   Precautions Precautions: Fall, Other (comment) Precaution Comments: BLE NWB; trach, peg Required Braces or Orthoses: Other Brace/Splint Other Brace/Splint: B resting hand splints; B PRAFO Restrictions Weight Bearing Restrictions: Yes RLE Weight Bearing: Non weight bearing LLE Weight Bearing: Non weight bearing Pain: Pain Assessment Pain Assessment: No/denies pain  See Function Navigator for Current Functional Status.   Therapy/Group: Individual Therapy  Kerney Elbe 02/22/2015, 4:08 PM

## 2015-02-22 NOTE — Progress Notes (Signed)
Occupational Therapy Session Note  Patient Details  Name: Juan [name] MRN: [id number] Date of Birth: 07/31/1990  Today's Date: 02/22/2015 OT Individual Time: 0900-1000 OT Individual Time Calculation (min): 60 min    Short Term Goals: Week 1:  OT Short Term Goal 1 (Week 1): Pt will tolerate sitting in upright postion in chair for 1.5 with appropriate sitting system OT Short Term Goal 2 (Week 1): Pt will visually track to stimulus 75% of time with extra time.  OT Short Term Goal 3 (Week 1): Pt will demonstrate focused attention 5 seconds with max A with extra time in prep for functional task  Skilled Therapeutic Interventions/Progress Updates:    Pt resting in bed upon arrival.  Pt engaged in dressing and grooming tasks sitting EOB and w/c at sink with focus on sitting balance, task initiation, attention to task, following one step commands, increased BUE use to assist with dressing and grooming tasks.  Pt required min A to max A for dynamic sitting balance EOB.  Pt exhibited decreased restlessness this morning but required increased assistance with sitting balance when initiating UE use for dressing tasks.  Pt also engaged in lateral leans to facilitate pulling up shorts.  Pt required max A for placement of UE and mod A to maintain position while leaning on elbow.  Pt transferred to w/c with sliding board (tot A + 2). Pt engaged in grooming tasks at sink including washing face and brushing teeth.  Pt required [facility] assist for washing face and initiated brushing teeth after toothbush (built up handle) placed in his R hand with support provided at elbow.  Pt was much more verbal this morning but language was more repeating what therapist said.  Pt initiated following one step commands approx 90% of time.  Pt remained in w/c with Mom present at end of session.  Therapy Documentation Precautions:  Precautions Precautions: Fall, Other (comment) Precaution Comments: BLE NWB; trach, peg Required  Braces or Orthoses: Other Brace/Splint Other Brace/Splint: B resting hand splints; B PRAFO Restrictions Weight Bearing Restrictions: Yes RLE Weight Bearing: Non weight bearing LLE Weight Bearing: Non weight bearing Pain: Pain Assessment Pain Assessment: Faces Faces Pain Scale: No hurt ADL: ADL ADL Comments: see FIM  See Function Navigator for Current Functional Status.   Therapy/Group: Individual Therapy  [name] 02/22/2015, 10:07 AM
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
## 2015-02-22 NOTE — Progress Notes (Signed)
Speech Language Pathology Daily Session Note  Patient Details  Name: Juan [name] MRN: [id number] Date of Birth: 1990-08-18  Today's Date: 02/22/2015 SLP Individual Time: 1100-1200 SLP Individual Time Calculation (min): 60 min  Short Term Goals: Week 2: SLP Short Term Goal 1 (Week 2): Patient will consume trials of Dys. 1 textures without overt s/s of aspiration over 3 consecutive sesions with Max A multimodal cues.  SLP Short Term Goal 2 (Week 2): Patient will consume trials of thin liquids with minimal overt s/s of aspiration with Max A multimodal cues.  SLP Short Term Goal 3 (Week 2): Patient will express basic wants/needs in 75% of opportunities with Max A multimodal cues.  SLP Short Term Goal 4 (Week 2): Patient will vocalize with Max A multimodal cues in 25% of opportunities  SLP Short Term Goal 5 (Week 2): Patient will initiate functional tasks in 50% of opportunities with Max A multimodal cues.  SLP Short Term Goal 6 (Week 2): Patient will sustain attention to a task for 60 seconds with Max A multimodal cues.   Skilled Therapeutic Interventions: Skilled treatment session focused on dysphagia and cognitive goals. Upon arrival, patient was supine in bed and was transferred to the wheelchair via the [facility] with +2 assist for safety. SLP facilitated session by providing trials of thin liquids via straw and Dys. 1 textures. Patient didn't demonstrate any overt s/s of aspiration but utilized 2-3 swallows with sips via straw. Recommend a FEES to assess patient's swallowing function and appropriate textures for PO intake. Patient followed commands in ~75% of opportunities with functional tasks, however, function is impacted by patient's perseveration with motor and verbal tasks. Patient also demonstrated increased verbal expression at the phrase level but continues to be aphonic at this time. Patient left upright in wheelchair with mother present. Continue with current plan of care.     Function:  Eating Eating   Modified Consistency Diet: Yes Eating Assist Level: Hand over hand assist (with trials from SLP)     Helper Scoops Food on Utensil: Every scoop     Cognition Comprehension Comprehension assist level: Understands basic 25 - 49% of the time/ requires cueing 50 - 75% of the time  Expression   Expression assist level: Expresses basic 25 - 49% of the time/requires cueing 50 - 75% of the time. Uses single words/gestures.  Social Interaction Social Interaction assist level: Interacts appropriately less than 25% of the time. May be withdrawn or combative.  Problem Solving Problem solving assist level: Solves basic less than 25% of the time - needs direction nearly all the time or does not effectively solve problems and may need a restraint for safety  Memory Memory assist level: Recognizes or recalls less than 25% of the time/requires cueing greater than 75% of the time    Pain Pain Assessment Pain Assessment: No/denies pain Faces Pain Scale: No hurt  Therapy/Group: Individual Therapy  Lyndzie Zentz 02/22/2015, 12:30 PM

## 2015-02-22 NOTE — Progress Notes (Signed)
Occupational Therapy Note  Patient Details  Name: Juan [name] MRN: [id number] Date of Birth: 1990/11/22  Today's Date: 02/22/2015 OT Individual Time: 1400-1415 OT Individual Time Calculation (min): 15 min  and Today's Date: 02/22/2015 OT Missed Time: 15 Minutes Missed Time Reason: Patient fatigue  Pt indicated he was extremely uncomfortable in the abdomen following replacement of PEG; RN aware; emotional support Individual Therapy  Attempted to engage patient in bed mobility and sitting balance activities.  Pt exhibited increased difficulty attending to tasks with increased restlessness during this session.  Pt will several outburst of tearfulness. Emotional support provided. Pt missed 15 mins therapy secondary to increased fatigue.    [name] [facility] 02/22/2015, 2:25 PM

## 2015-02-22 NOTE — Progress Notes (Signed)
At 1015 am attempt made to administer tube feeding, resistance meet. Made Dan A., PA aware, given order to place 20 foley tube, and order KUB to check placement. Explain placement to patient's mother, she verbalized understanding. Patient tolerated well with mother at bedside. Will continue to monitor patient. Reserve

## 2015-02-23 ENCOUNTER — Inpatient Hospital Stay (HOSPITAL_COMMUNITY): Payer: Self-pay | Admitting: Speech Pathology

## 2015-02-23 ENCOUNTER — Inpatient Hospital Stay (HOSPITAL_COMMUNITY): Payer: 59 | Admitting: Physical Therapy

## 2015-02-23 ENCOUNTER — Inpatient Hospital Stay (HOSPITAL_COMMUNITY): Payer: 59 | Admitting: Speech Pathology

## 2015-02-23 ENCOUNTER — Inpatient Hospital Stay (HOSPITAL_COMMUNITY): Payer: 59 | Admitting: *Deleted

## 2015-02-23 MED ORDER — POLYETHYLENE GLYCOL 3350 17 G PO PACK: 17.0000 g | PACK | ORAL | Status: DC | PRN

## 2015-02-23 MED ORDER — JEVITY 1.2 CAL PO LIQD
340.0000 mL | Freq: Three times a day (TID) | ORAL | Status: DC
  Administered 2015-02-23 – 2015-02-24 (×4): 340 mL
  Filled 2015-02-23: qty 474

## 2015-02-23 MED ORDER — FENTANYL 12 MCG/HR TD PT72
12.5000 ug | MEDICATED_PATCH | TRANSDERMAL | Status: DC
  Administered 2015-02-25: 12.5 ug via TRANSDERMAL
  Filled 2015-02-23: qty 1

## 2015-02-23 MED ORDER — QUETIAPINE FUMARATE 25 MG PO TABS
75.0000 mg | ORAL_TABLET | Freq: Every day | ORAL | Status: DC
  Administered 2015-02-23 – 2015-02-27 (×5): 75 mg
  Filled 2015-02-23 (×10): qty 1

## 2015-02-23 NOTE — Progress Notes (Signed)
Speech Language Pathology Daily Session Note  Patient Details  Name: Juan [name] MRN: [id number] Date of Birth: 1990/12/14  Today's Date: 02/23/2015 SLP Individual Time: 1000-1030 SLP Individual Time Calculation (min): 30 min  Short Term Goals: Week 2: SLP Short Term Goal 1 (Week 2): Patient will consume trials of Dys. 1 textures without overt s/s of aspiration over 3 consecutive sesions with Max A multimodal cues.  SLP Short Term Goal 2 (Week 2): Patient will consume trials of thin liquids with minimal overt s/s of aspiration with Max A multimodal cues.  SLP Short Term Goal 3 (Week 2): Patient will express basic wants/needs in 75% of opportunities with Max A multimodal cues.  SLP Short Term Goal 4 (Week 2): Patient will vocalize with Max A multimodal cues in 25% of opportunities  SLP Short Term Goal 5 (Week 2): Patient will initiate functional tasks in 50% of opportunities with Max A multimodal cues.  SLP Short Term Goal 6 (Week 2): Patient will sustain attention to a task for 60 seconds with Max A multimodal cues.   Skilled Therapeutic Interventions: Skilled treatment session focused on family education and observation with nectar-thick liquids via straw.  The patient's mother and grandmother were educated on how to maximize proper positioning and safety with PO intake (sit upright in wheelchair, self-feeding, small bites/sips). Both verbalized understanding. Patient observed with nectar-thick liquids via straw without overt s/s of aspiration and required Max-Total A multimodal cues for use of small sips. Patient left upright in wheelchair with family present. Continue with current plan of care.    Function:  Eating Eating   Modified Consistency Diet: Yes Eating Assist Level: Help managing cup/glass;Assistive Device (thickener)           Cognition Comprehension Comprehension assist level: Understands basic 25 - 49% of the time/ requires cueing 50 - 75% of the time  Expression    Expression assist level: Expresses basic 25 - 49% of the time/requires cueing 50 - 75% of the time. Uses single words/gestures.  Social Interaction Social Interaction assist level: Interacts appropriately less than 25% of the time. May be withdrawn or combative.  Problem Solving Problem solving assist level: Solves basic less than 25% of the time - needs direction nearly all the time or does not effectively solve problems and may need a restraint for safety  Memory Memory assist level: Recognizes or recalls less than 25% of the time/requires cueing greater than 75% of the time    Pain Pain Assessment Pain Assessment: No/denies pain Faces Pain Scale: No hurt  Therapy/Group: Individual Therapy  Grisel Blumenstock 02/23/2015, 10:43 AM
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
## 2015-02-23 NOTE — Progress Notes (Signed)
Occupational Therapy Weekly Progress Note  Patient Details  Name: Juan [name] MRN: [id number] Date of Birth: Aug 12, 1990  Beginning of progress report period: February 13, 2015 End of progress report period: February 23, 2015    Patient has met 3 of 3 short term goals.  Pt has made consistent progress since admission and is exhibiting gains in sustained attention, following one step commands, sitting balance, initiating functional tasks, and verbally responding to questions.  Pt continues to required tot A + 2 for BADLs and max A to complete BUE tasks once initiated.  Pt exhibits increased BUE tone when initiating functional tasks.  Pt responds to one step commands approx 75% of time.  Pt requires max verbal cues to sustain attention for approx one min during funcitonal tasks.  Pt requires mod/max A for unsupported sitting balance EOB when engaging in BADLs. Pt is functioning at a [place].  Patient continues to demonstrate the following deficits: decreased attention and awareness and ability to consistently follow 1 step directions more than 75% of the time, restlessness, decreased trunk control and sitting balance, NWB BLE, BUE hypertone with functional movements, limited functional use of BUE, apraxia and ataxia, muscle weakness and therefore will continue to benefit from skilled OT intervention to enhance overall performance with BADL.  Patient progressing toward long term goals..  Continue plan of care.  OT Short Term Goals Week 1:  OT Short Term Goal 1 (Week 1): Pt will tolerate sitting in upright postion in chair for 1.5 with appropriate sitting system OT Short Term Goal 1 - Progress (Week 1): Met OT Short Term Goal 2 (Week 1): Pt will visually track to stimulus 75% of time with extra time.  OT Short Term Goal 2 - Progress (Week 1): Met OT Short Term Goal 3 (Week 1): Pt will demonstrate focused attention 5 seconds with max A with extra time in prep for functional task OT Short Term  Goal 3 - Progress (Week 1): Met Week 2:  OT Short Term Goal 1 (Week 2): Pt will sit EOB with min A for 15 min to participate in BADLs OT Short Term Goal 2 (Week 2): Pt will wash face with min A and min verbal cues to initiate task OT Short Term Goal 3 (Week 2): Pt will perform UB dressing tasks with max A OT Short Term Goal 4 (Week 2): Pt will exhibit sustained attention for 1 min with mod verbal cues      Therapy Documentation Precautions:  Precautions Precautions: Fall, Other (comment) Precaution Comments: BLE NWB; trach, peg Required Braces or Orthoses: Other Brace/Splint Other Brace/Splint: B resting hand splints; B PRAFO Restrictions Weight Bearing Restrictions: Yes RLE Weight Bearing: Non weight bearing LLE Weight Bearing: Non weight bearing  ADL: ADL ADL Comments: see FIM  Leroy Libman 02/23/2015, 6:45 AM

## 2015-02-23 NOTE — Progress Notes (Signed)
Speech Language Pathology Daily Session Note  Patient Details  Name: Juan [name] MRN: [id number] Date of Birth: 10-26-1990  Today's Date: 02/23/2015 SLP Individual Time: 1330-1400 SLP Individual Time Calculation (min): 30 min  Short Term Goals: Week 2: SLP Short Term Goal 1 (Week 2): Patient will consume trials of Dys. 1 textures without overt s/s of aspiration over 3 consecutive sesions with Max A multimodal cues.  SLP Short Term Goal 2 (Week 2): Patient will consume trials of thin liquids with minimal overt s/s of aspiration with Max A multimodal cues.  SLP Short Term Goal 3 (Week 2): Patient will express basic wants/needs in 75% of opportunities with Max A multimodal cues.  SLP Short Term Goal 4 (Week 2): Patient will vocalize with Max A multimodal cues in 25% of opportunities  SLP Short Term Goal 5 (Week 2): Patient will initiate functional tasks in 50% of opportunities with Max A multimodal cues.  SLP Short Term Goal 6 (Week 2): Patient will sustain attention to a task for 60 seconds with Max A multimodal cues.   Skilled Therapeutic Interventions: Skilled treatment session focused on family education in regards to patient's current swallowing function, appropriate textures, diet recommendations and swallowing compensatory strategies. Patient's mother and grandmother demonstrated how to appropriately thicken liquids to a nectar-thick consistency, provided hand over hand assist for self-feeding and provide appropriate cueing for safety with a snack of Dys. 1 textures and nectar-thick liquids via straw. Patient consumed snack without overt s/s of aspiration. Patient left upright in wheelchair with family present. Continue with current plan of care.    Function:  Eating Eating   Modified Consistency Diet: Yes Eating Assist Level: Help managing cup/glass;Assistive Device;Hand over hand assist (thickener)           Cognition Comprehension Comprehension assist level: Understands  basic 25 - 49% of the time/ requires cueing 50 - 75% of the time  Expression   Expression assist level: Expresses basic 25 - 49% of the time/requires cueing 50 - 75% of the time. Uses single words/gestures.  Social Interaction Social Interaction assist level: Interacts appropriately less than 25% of the time. May be withdrawn or combative.  Problem Solving Problem solving assist level: Solves basic less than 25% of the time - needs direction nearly all the time or does not effectively solve problems and may need a restraint for safety  Memory Memory assist level: Recognizes or recalls less than 25% of the time/requires cueing greater than 75% of the time    Pain Pain Assessment Pain Assessment: No/denies pain  Therapy/Group: Individual Therapy  Forbes Loll 02/23/2015, 3:57 PM

## 2015-02-23 NOTE — Progress Notes (Signed)
Occupational Therapy Session Note  Patient Details  Name: Juan [name] MRN: [id number] Date of Birth: July 23, 1990  Today's Date: 02/23/2015 OT Individual Time: 6578-4696 OT Individual Time Calculation (min): 75 min    Short Term Goals: Week 2:  OT Short Term Goal 1 (Week 2): Pt will sit EOB with min A for 15 min to participate in BADLs OT Short Term Goal 2 (Week 2): Pt will wash face with min A and min verbal cues to initiate task OT Short Term Goal 3 (Week 2): Pt will perform UB dressing tasks with max A OT Short Term Goal 4 (Week 2): Pt will exhibit sustained attention for 1 min with mod verbal cues  Skilled Therapeutic Interventions/Progress Updates:    Pt resting in bed upon arrival with Mom present.  Pt stated he was ready to get up.  Pt initiated sitting EOB by placing legs off edge of bed and positioning L elbow properly before initiating pushing up to sitting position.  Pt required min A to complete task.  Pt required mod/max A for sitting balance to don tshirt.  Pt transferred to w/c with sliding board (tot A + 2). Pt engaged in grooming tasks at sink-washing face and brushing teeth.  Pt was able to bring the wash cloth to face and wipe eyes and cheeks without assistance after wash cloth placed in R hand.  Pt brushed teeth after tooth brush placed in pts R hand.  Pt did not require assistance to complete task.  Pt reached for football when presented and was able to bring BUE to approx 75 degrees to retrieve ball.  Pt noted with increased appropriate verbalization during this session with less perseveration/echolalia.  Pt remained in w/c with Mom present. Focus on bed mobility, task initiation, sequencing, BUE use, sitting balance, functional transfers,cognitive remediation, and safety awareness.  Therapy Documentation Precautions:  Precautions Precautions: Fall, Other (comment) Precaution Comments: BLE NWB; trach, peg Required Braces or Orthoses: Other Brace/Splint Other  Brace/Splint: B resting hand splints; B PRAFO Restrictions Weight Bearing Restrictions: Yes RLE Weight Bearing: Non weight bearing LLE Weight Bearing: Non weight bearing  Pain: Pain Assessment Faces Pain Scale: No hurt ADL: ADL ADL Comments: see FIM  See Function Navigator for Current Functional Status.   Therapy/Group: Individual Therapy  [name] 02/23/2015, 10:03 AM

## 2015-02-23 NOTE — Progress Notes (Signed)
Physical Therapy Session Note  Patient Details  Name: Juan [name] MRN: [id number] Date of Birth: 15-Apr-1991  Today's Date: 02/23/2015 PT Individual Time: 1100-1130 and 1600-1630 PT Individual Time Calculation (min): 30 min and 30 min  Short Term Goals: Week 2:  PT Short Term Goal 1 (Week 2): Patient will sustain attention to functional tasks x 60 sec with max multimodal cues.  PT Short Term Goal 2 (Week 2): Patient will initiate functional tasks 50% of available opportunities with max multimodal cues.  PT Short Term Goal 3 (Week 2): Patient will maintain sitting balance with assist of one person x 15 min.   Skilled Therapeutic Interventions/Progress Updates:   Session 1: Patient received in bed with nurse techs departing after changing patient following bowel movement. RN present to administer tube feed with HOB elevated 30 deg. During tube feed, patient with audible rumbling stomach and and 3 loose bowel movements. Performed rolling to R and L with max multimodal cues to for initiation and termination of task for total A hygiene and changing shorts. Upon preparing to transfer to wheelchair, patient with another loose bowel movement. Due to possible upset stomach and toileting needs, patient missed 30 min skilled physical therapy, will f/u per POC.   Session 2: Patient in wheelchair with family and RN present, patient verbalizing need for bowel movement (first time he was able to verbalize need to toilet). Performed slide board transfer wheelchair <> BSC with total A x 1 and +3 for safety. Performed lateral leans to R and L for clothing management with max A x 2 for balance and another person adjusting clothing. Patient continent of loose stool. Patient required max multimodal cues for maintaining weight shifted anteriorly due to extensor tone and to maintain BLE NWB as patient attempted to stand multiple times. Engaged in conversation with focus on cognitive goals and patient's interests  Pharmacist, hospital game this weekend), patient repeatedly stated, "I don't have a clue," requiring max multimodal cues for functional problem solving and orientation. Patient left sitting in wheelchair with family present.   Therapy Documentation Precautions:  Precautions Precautions: Fall, Other (comment) Precaution Comments: BLE NWB; trach, peg Required Braces or Orthoses: Other Brace/Splint Other Brace/Splint: B resting hand splints; B PRAFO Restrictions Weight Bearing Restrictions: Yes RLE Weight Bearing: Non weight bearing LLE Weight Bearing: Non weight bearing General: PT Amount of Missed Time (min): 30 Minutes PT Missed Treatment Reason: Toileting (continuous loose stool) Pain: Pain Assessment Pain Assessment: No/denies pain Faces Pain Scale: No hurt   See Function Navigator for Current Functional Status.   Therapy/Group: Individual Therapy  Kerney Elbe 02/23/2015, 11:41 AM

## 2015-02-23 NOTE — Progress Notes (Signed)
[place] PHYSICAL MEDICINE & REHABILITATION     PROGRESS NOTE    Subjective/Complaints: Did well again last night. Still wants a pepsi!  Pain controlled. Urinary dribbles but no significant output. Still requires caths  ROS --cannot perform due to cognitive status  Objective: Vital Signs: Blood pressure 110/60, pulse 86, temperature 98 F (36.7 C), temperature source Oral, resp. rate 18, height  (1.676 m), weight 65.772 kg (145 lb), SpO2 98 %. Dg Abd Portable 1v  02/22/2015   CLINICAL DATA:  Post percutaneous endoscopic gastrostomy tube placement.  EXAM: PORTABLE ABDOMEN - 1 VIEW  COMPARISON:  02/22/2015  FINDINGS: Tube injection demonstrates filling of the stomach. Again noted is an IVC filter. Difficult to exclude streaky densities at the medial left lung base.  IMPRESSION: Gastrostomy tube positioned in the stomach.   Electronically Signed   By: Richarda Overlie M.D.   On: 02/22/2015 17:13   Dg Abd Portable 1v  02/22/2015   CLINICAL DATA:  Status post percutaneous endoscopic gastrostomy tube placement.  EXAM: PORTABLE ABDOMEN - 1 VIEW  COMPARISON:  02/20/2015  FINDINGS: Bowel gas pattern is nonobstructed. No evidence for free intraperitoneal air. Gastrostomy tube is poorly visualized. IVC filter again noted, apex overlying L1.  IMPRESSION: No evidence for acute  abnormality.   Electronically Signed   By: Norva Pavlov M.D.   On: 02/22/2015 15:16   No results for input(s): WBC, HGB, HCT, PLT in the last 72 hours. No results for input(s): NA, K, CL, GLUCOSE, BUN, CREATININE, CALCIUM in the last 72 hours.  Invalid input(s): CO CBG (last 3)   Recent Labs  02/20/15 1404  GLUCAP 102*    Wt Readings from Last 3 Encounters:  02/23/15 65.772 kg (145 lb)  02/12/15 73.936 kg (163 lb)  07/31/12 63.504 kg (140 lb)    Physical Exam:  Constitutional: He appears well-developed and well-nourished.  Alert.  HENT:  Head: Normocephalic and atraumatic.  Right Ear: External ear normal.   Left Ear: External ear normal.    Eyes: Right eye exhibits no discharge. Left eye exhibits no discharge. No scleral icterus. EOMI.  Neck: Normal range of motion. Neck supple. No JVD present. No tracheal deviation present. No thyromegaly present.  Janina Mayo site still open externally although it appears it might be closed deep Cardiovascular: Normal rate and regular rhythm.  Respiratory: Effort normal and breath sounds normal. No respiratory distress. He has no wheezes. i heard no rales GI: Soft. Bowel sounds are normal. He exhibits no distension. There is no tenderness. There is no rebound and no guarding.  PEG tube (foley) in place--skin unchanged.   Genitourinary:  Foley out  Musculoskeletal: He exhibits no edema.  Neurological: Increased muscle tone in b/l UE.   Moves all 4's. No resting tone. Still some primitive posturing. Able to volitionally move exts. Appears less distracted  Skin:  Numerous surgical scars on both LE's. All wounds essentially healed in both legs. Has foam dressings on to protect heels. Psychiatric:  Remains internally distracted but less but activity subsiding.  Assessment/Plan: 1. 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 Physiatrist is providing close team supervision and 24 hour management of active medical problems listed below. Physiatrist and rehab team continue to assess barriers to discharge/monitor patient progress toward functional and medical goals.  Function:  Bathing Bathing position   Position: Bed  Bathing parts   Body parts bathed by helper: Right  arm, Left arm, Chest, Abdomen, Front perineal area, Right upper leg, Buttocks, Left upper leg, Right lower leg, Left lower leg, Back  Bathing assist Assist Level: 2 helpers      Upper Body Dressing/Undressing Upper body dressing   What is the patient wearing?: Pull over shirt/dress        Pull over shirt/dress - Perfomed by helper: Thread/unthread right sleeve, Thread/unthread left sleeve, Put head through opening, Pull shirt over trunk        Upper body assist        Lower Body Dressing/Undressing Lower body dressing   What is the patient wearing?: Underwear, Pants, Socks   Underwear - Performed by helper: Thread/unthread right underwear leg, Thread/unthread left underwear leg, Pull underwear up/down   Pants- Performed by helper: Thread/unthread right pants leg, Thread/unthread left pants leg, Pull pants up/down   Non-skid slipper socks- Performed by helper: Don/doff right sock, Don/doff left sock   Socks - Performed by helper: Don/doff right sock, Don/doff left sock              Lower body assist Assist for lower body dressing: 2 Designer, multimedia activity did not occur: No continent bowel/bladder event        Toileting assist     Transfers Chair/bed transfer   Chair/bed transfer method: Lateral scoot Chair/bed transfer assist level: 2 helpers Chair/bed transfer assistive device: Sliding board Mechanical lift: Maximove   Locomotion Ambulation Ambulation activity did not occur: Safety/medical concerns (BLE NWB)         Wheelchair Wheelchair activity did not occur: Safety/medical concerns (low level TBI) Type: Manual   Assist Level: Dependent (Pt equals 0%)  Cognition Comprehension Comprehension assist level: Understands basic 25 - 49% of the time/ requires cueing 50 - 75% of the time  Expression Expression assist level: Expresses basic 25 - 49% of the time/requires cueing 50 - 75% of the time. Uses single words/gestures.  Social Interaction Social Interaction assist level: Interacts appropriately less than 25% of the time. May be withdrawn or combative.  Problem Solving Problem solving assist level: Solves basic less than 25% of the time - needs direction nearly all the time or does not effectively solve problems  and may need a restraint for safety  Memory Memory assist level: Recognizes or recalls less than 25% of the time/requires cueing greater than 75% of the time   Medical Problem List and Plan: 1. 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 Activity tolerance improving also   .  2. DVT Prophylaxis/Anticoagulation: Subcutaneous Lovenox as well as IVC filter 01/12/2015---continue 3. Pain Management: Baclofen 10 mg 3 times a day,  ,Hycet as needed.  -reduce fentanyl to with next change 4. Mood/behavior/cognition: increased ritalin to 10mg .  Inderal 100 mg 3 times a day,   Klonopin to 1 mg twice a day.  -sleep much improved  -continue seroquel qhs but reduce to 75mg   -continue sleep chart  -limiting day time neuro-sedating meds as possible    5. Neuropsych: This patient is not capable of making decisions on his own behalf. 6. Skin/Wound Care: Routine skin checks 7. Fluids/Electrolytes/Nutrition: continue to monitor labs.  8. Uncontrollable rhythmic movements/spasticity. Question related to anti-psychotic medications and adjustments made. 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 Dysphagia:  -Gastrostomy tube 01/18/2015   -now on bolus feeding schedule  -continue foley cath for now in place of g-tube which was accidentally pulled out  -for MBS today 11. Large tension pneumothorax on the right with multiple rib fractures/pulmonary  -?LLL pneumonia---CXR improved, clinically improved also  -continue levaquin for now given recent bolus feed intolerance.  -aspiration precautions  -decannulated without  issue, stoma closing 12. Multiple orthopedic fractures, L1-2 transverse process fractures,/left femur fracture with IM nailing, left and right open tibia fibular fractures, transverse acetabulum fracture status post percutaneous pinning,CM pelvic ring status post ORIF, pubic symphysis and trans-sacral screw forL S I diastasis and right sacralalo fracture. Nonweightbearing bilateral lower extremities 8 weeks until 03/17/2015 13. Urine retention/neurogenic bladder: flomax trial, urecholine added also  I/O CATH prn  -encourage spontaneous voids as possible, timed voids   LOS (Days) 10 A FACE TO FACE EVALUATION WAS PERFORMED  Elke Holtry T 02/23/2015 10:01 AM

## 2015-02-24 ENCOUNTER — Inpatient Hospital Stay (HOSPITAL_COMMUNITY): Payer: Self-pay | Admitting: Speech Pathology

## 2015-02-24 ENCOUNTER — Inpatient Hospital Stay (HOSPITAL_COMMUNITY): Payer: 59 | Admitting: *Deleted

## 2015-02-24 DIAGNOSIS — S3282XD Multiple fractures of pelvis without disruption of pelvic ring, subsequent encounter for fracture with routine healing: Secondary | ICD-10-CM

## 2015-02-24 DIAGNOSIS — T791XXD Fat embolism (traumatic), subsequent encounter: Secondary | ICD-10-CM

## 2015-02-24 MED ORDER — RESOURCE THICKENUP CLEAR PO POWD
ORAL | Status: DC | PRN
  Filled 2015-02-24: qty 125

## 2015-02-24 NOTE — Progress Notes (Signed)
Patient ID: Juan [name], male   DOB: [birth year], 24 y.o.   MRN: [id number]    [place] PHYSICAL MEDICINE & REHABILITATION     PROGRESS NOTE  02/24/15.      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 rate 18, height  (1.676 m), weight 145 lb 4.5 oz (65.9 kg), SpO2 99 %. Dg Abd Portable 1v  02/22/2015   CLINICAL DATA:  Post percutaneous endoscopic gastrostomy tube placement.  EXAM: PORTABLE ABDOMEN - 1 VIEW  COMPARISON:  02/22/2015  FINDINGS: Tube injection demonstrates filling of the stomach. Again noted is an IVC filter. Difficult to exclude streaky densities at the medial left lung base.  IMPRESSION: Gastrostomy tube positioned in the stomach.   Electronically Signed   By: Richarda Overlie M.D.   On: 02/22/2015 17:13   Dg Abd Portable 1v  02/22/2015   CLINICAL DATA:  Status post percutaneous endoscopic gastrostomy tube placement.  EXAM: PORTABLE ABDOMEN - 1 VIEW  COMPARISON:  02/20/2015  FINDINGS: Bowel gas pattern is nonobstructed. No evidence for free intraperitoneal air. Gastrostomy tube is poorly visualized. IVC filter again noted, apex overlying L1.  IMPRESSION: No evidence for acute  abnormality.   Electronically Signed   By: Norva Pavlov M.D.   On: 02/22/2015 15:16   No results for input(s): WBC, HGB, HCT, PLT in the last 72 hours. No results for input(s): NA, K, CL, GLUCOSE, BUN, CREATININE, CALCIUM in the last 72 hours.  Invalid input(s): CO CBG (last 3)  No results for input(s): GLUCAP in the last 72 hours.  Wt Readings from Last 3 Encounters:  02/24/15 145 lb 4.5 oz (65.9 kg)  02/12/15 163 lb (73.936 kg)   07/31/12 140 lb (63.504 kg)    Physical Exam:  Constitutional: He appears well-developed and well-nourished.  Alert.  HENT:  Head: Normocephalic and atraumatic.  Right Ear: External ear normal.  Left Ear: External ear normal.    Eyes: EOMI.  Neck: Normal range of motion. Neck supple. No JVD present. No tracheal deviation present. No thyromegaly present.  Trach site  Cardiovascular: Normal rate and regular rhythm.  Respiratory: Effort normal and breath sounds normal. No respiratory distress. He has no wheezes. i heard no rales GI: Soft. Bowel sounds are normal. He exhibits no distension. There is no tenderness. There is no rebound and no guarding.  PEG tube (foley) in place--skin unchanged.   Genitourinary:   Musculoskeletal: He exhibits no edema.  Neurological: Increased muscle tone in b/l UE.   Moves all 4's. No resting tone. Able to volitionally move exts. Appears less distracted  Skin:  Numerous surgical scars on both LE's. All wounds essentially healed in both legs.      Medical Problem List and Plan: 1. 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  2. DVT Prophylaxis/Anticoagulation: Subcutaneous Lovenox as well as IVC filter 01/12/2015---continue 3. Pain Management: Baclofen 10 mg 3 times a day,  ,Hycet as needed.  -reduce fentanyl to with next change 4. Mood/behavior/cognition: increased ritalin to .  Inderal 100 mg 3 times a day,   Klonopin to 1 mg twice a  day.  -sleep much improved  -continue seroquel qhs but reduce to   -continue sleep chart  -limiting day time neuro-sedating meds as possible   5. Dysphagia:  -Gastrostomy tube 01/18/2015   -now on bolus feeding schedule  -continue foley cath for now in place of g-tube which was accidentally pulled out  -for MBS today 6. Large tension pneumothorax on the right with multiple rib  fractures/pulmonary  -?LLL pneumonia---CXR improved, clinically improved also  -continue levaquin for now given recent bolus feed intolerance.  -aspiration precautions  -decannulated without issue, stoma closing 7. Multiple orthopedic fractures, L1-2 transverse process fractures,/left femur fracture with IM nailing, left and right open tibia fibular fractures, transverse acetabulum fracture status post percutaneous pinning,CM pelvic ring status post ORIF, pubic symphysis and trans-sacral screw forL S I diastasis and right sacralalo fracture. Nonweightbearing bilateral lower extremities 8 weeks until 03/17/2015 8. Urine retention/neurogenic bladder: flomax trial, urecholine added also  I/O CATH prn  -encourage spontaneous voids as possible, timed voids   LOS (Days) 11 A FACE TO FACE EVALUATION WAS PERFORMED  Rogelia Boga 02/24/2015 8:50 AM

## 2015-02-24 NOTE — Progress Notes (Signed)
Speech Language Pathology Daily Session Note  Patient Details  Name: Juan [name] MRN: [id number] Date of Birth: 1991-02-02  Today's Date: 02/24/2015 SLP Individual Time: 1000-1100 SLP Individual Time Calculation (min): 60 min  Short Term Goals: Week 2: SLP Short Term Goal 1 (Week 2): Patient will consume trials of Dys. 1 textures without overt s/s of aspiration over 3 consecutive sesions with Max A multimodal cues.  SLP Short Term Goal 2 (Week 2): Patient will consume trials of thin liquids with minimal overt s/s of aspiration with Max A multimodal cues.  SLP Short Term Goal 3 (Week 2): Patient will express basic wants/needs in 75% of opportunities with Max A multimodal cues.  SLP Short Term Goal 4 (Week 2): Patient will vocalize with Max A multimodal cues in 25% of opportunities  SLP Short Term Goal 5 (Week 2): Patient will initiate functional tasks in 50% of opportunities with Max A multimodal cues.  SLP Short Term Goal 6 (Week 2): Patient will sustain attention to a task for 60 seconds with Max A multimodal cues.   Skilled Therapeutic Interventions: Skilled treatment session focused on dysphagia and cognitive goals.  SLP facilitated session by providing max A for patient to implement swallow strategies during po trials of dysphagia 1 and nectar thick liquids by straw.  He displayed no s/s of aspiration during either solids or liquids.  Pt is to remain on his current diet of dysphagia 1 and nectar thick liquids by straw with full supervision while out of bed.  Patient's girlfriend observed limited po trials; therefore, she has yet to be signed off to supervise patient during po intake.  Patient required hand over hand assistance to initiate movement to reach for cup/spoon as well as to guide utensil/cup to mouth.  SLP provided tactile cue by removing straw from mouth for patient to take small sips.  He required mod-max A with basic orientation; he was oriented x3.  He required semantic cues  and extra wait time for place and SLP modeled the date due to patient only providing month.  He responded to yes/no questions with mod-max A; he required extra time and intermittent repetition of the question.  Patient frequently perseverated on requesting a Pepsi during po trials.  SLP ordered thickener for patient to consume desired liquids.  Continue with current plan of care.   Function:  Eating Eating Eating activity did not occur: Safety/medical concerns Modified Consistency Diet: Yes Eating Assist Level: Hand over hand assist;Help managing cup/glass;Helper performs IV, parenteral or tube feed;Helper brings food to mouth;Help with picking up utensils;Helper feeds patient     Mudlogger on Utensil: Every Gaffer to Mouth: Every scoop   Cognition Comprehension Comprehension assist level: Understands basic 25 - 49% of the time/ requires cueing 50 - 75% of the time  Expression   Expression assist level: Expresses basic 25 - 49% of the time/requires cueing 50 - 75% of the time. Uses single words/gestures.  Social Interaction Social Interaction assist level: Interacts appropriately 25 - 49% of time - Needs frequent redirection.  Problem Solving Problem solving assist level: Solves basic less than 25% of the time - needs direction nearly all the time or does not effectively solve problems and may need a restraint for safety  Memory Memory assist level: Recognizes or recalls less than 25% of the time/requires cueing greater than 75% of the time    Pain Pain Assessment Pain Assessment: No/denies pain  Therapy/Group: Individual Therapy  Cranford Mon,  M.A. Leonides Cave, Lanora Manis A 02/24/2015, 12:49 PM

## 2015-02-24 NOTE — Progress Notes (Signed)
Physical Therapy Session Note  Patient Details  Name: Juan [name] MRN: [id number] Date of Birth: 06-22-1990  Today's Date: 02/24/2015 PT Individual Time: 1430-1530 PT Individual Time Calculation (min): 60 min    Skilled Therapeutic Interventions/Progress Updates:  Patient in bed at the beginning of the session, girlfriend present and assists during session. Patient with uncoordinated movements, lays across in the bed but with mod A and slow cues able to return to regular supine position. Transfer to tilt in space w/c with mechanical lift.  Patient positioned in most seated position available in the w/c. Attempted to perform simple tasks in the small therapy room, patient got very emotional when the subject of his son came up. To redirect offered to take patient down stairs to look at the weather and change scenery. While traveling downstairs and sitting in a lobby attempted to engage patient in meaningful conversation about his birthday , work and hobbies. Able to answer simple questions, most of time with one word , or one phrase. Returned to room , able to participate in passing the ball between him, therapist and girlfriend with max VC and decreased time that patient stayed engaged. Educated family on sensory stimulation.  Patient left in room seating in w/c with a QR belt on and girlfriend present.   Therapy Documentation Precautions:  Precautions Precautions: Fall, Other (comment) Precaution Comments: BLE NWB; trach, peg Required Braces or Orthoses: Other Brace/Splint Other Brace/Splint: B resting hand splints; B PRAFO Restrictions Weight Bearing Restrictions: Yes RLE Weight Bearing: Non weight bearing LLE Weight Bearing: Non weight bearing Vital Signs: Therapy Vitals Temp: 98.8 F (37.1 C) Temp Source: Oral Pulse Rate: 91 Resp: 18 BP: 115/69 mmHg Patient Position (if appropriate): Lying Oxygen Therapy SpO2: 99 % O2 Device: Not Delivered Pain: Pain Assessment Pain  Assessment: No/denies pain   See Function Navigator for Current Functional Status.   Therapy/Group: Individual Therapy  Dorna Mai 02/24/2015, 4:13 PM

## 2015-02-25 ENCOUNTER — Inpatient Hospital Stay (HOSPITAL_COMMUNITY): Payer: Self-pay | Admitting: Occupational Therapy

## 2015-02-25 MED ORDER — JEVITY 1.2 CAL PO LIQD
340.0000 mL | Freq: Three times a day (TID) | ORAL | Status: DC
  Administered 2015-02-25: 340 mL
  Filled 2015-02-25: qty 474

## 2015-02-25 NOTE — Progress Notes (Signed)
Registered Dietitian on call [employer] contacted about bolus feed and parameters. Patient eating at least half of meals today. Per family patient reported feeling full after lunch. This morning after breakfast residuals at 300. OK to hold tube feeds when intake greater than 50 percent.

## 2015-02-25 NOTE — Progress Notes (Signed)
Occupational Therapy Session Note  Patient Details  Name: Juan [name] MRN: [id number] Date of Birth: Jun 16, 1990  Today's Date: 02/25/2015 OT Individual Time: 0800-0900 OT Individual Time Calculation (min): 60 min    Short Term Goals: Week 2:  OT Short Term Goal 1 (Week 2): Pt will sit EOB with min A for 15 min to participate in BADLs OT Short Term Goal 2 (Week 2): Pt will wash face with min A and min verbal cues to initiate task OT Short Term Goal 3 (Week 2): Pt will perform UB dressing tasks with max A OT Short Term Goal 4 (Week 2): Pt will exhibit sustained attention for 1 min with mod verbal cues  Skilled Therapeutic Interventions/Progress Updates:    Pt seen for OT therapy session focusing on ADL retraining, sustained attention, and following simple commands. Pt in supine upon arrival, voicing desire to get OOB. +2 assist for sliding board transfer to bed with mod-max A required for sitting balance EOB. In w/c at sink, pt complete oral hygiene with set-up, with emphasis on pt reaching forward to obtain items and VCs for sequencing of task. Pt demonstrated ability to fully extend B UEs, come forward off back w/c to reach item, and some associated reactions with movement.   Pt taken around unit to various rooms in ADL apartment with pt required to identify the various rooms. Mod questioning cues required for pt to identify rooms.  Worked with pt for sustained attention during functional task of removing socks. Max verbal cues and redirection for pt to complete task, pt able to remove one sock and partially remove other sock before pt unable to cont with task and becoming slightly frustrated with task as he was unsure of purpose of task.  Pt not orientated to place or situation during session, and with constant motion while seated in reclining w/c.  Pt returned to room at end of session, desiring to stay up in chair. Pt left in chair with QRB donned and caregiver present.  Therapy  Documentation Precautions:  Precautions Precautions: Fall, Other (comment) Precaution Comments: BLE NWB; trach, peg Required Braces or Orthoses: Other Brace/Splint Other Brace/Splint: B resting hand splints; B PRAFO Restrictions Weight Bearing Restrictions: Yes RLE Weight Bearing: Non weight bearing LLE Weight Bearing: Non weight bearing Pain:   No/ denies pain ADL: ADL ADL Comments: see FIM  See Function Navigator for Current Functional Status.   Therapy/Group: Individual Therapy  Lewis, Cynda Soule C 02/25/2015, 7:32 AM

## 2015-02-25 NOTE — Progress Notes (Signed)
Patient ID: Juan [name], male   DOB: [birth year], 24 y.o.   MRN: [id number]   Patient ID: Juan [name], male   DOB: Dec 11, 1990, 24 y.o.   MRN: [id number]    [facility]     PROGRESS NOTE  02/24/15.      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 rate 16, height  (1.676 m), weight 145 lb 11.2 oz (66.089 kg), SpO2 99 %. No results found. No results for input(s): WBC, HGB, HCT, PLT in the last 72 hours. No results for input(s): NA, K, CL, GLUCOSE, BUN, CREATININE, CALCIUM in the last 72 hours.  Invalid input(s): CO CBG (last 3)  No results for input(s): GLUCAP in the last 72 hours.  Wt Readings from Last 3 Encounters:  02/25/15 145 lb 11.2 oz (66.089 kg)  02/12/15 163 lb (73.936 kg)  07/31/12 140 lb (63.504 kg)    Physical Exam:  Constitutional: He appears well-developed and well-nourished.  Alert.  HENT:  Head: Normocephalic and atraumatic.  Right Ear: External ear normal.  Left Ear: External ear normal.    Eyes: EOMI.  Neck: Normal range of motion. Neck supple. No JVD present. No tracheal deviation present. No thyromegaly present.  Trach site  Cardiovascular: Normal rate and regular rhythm.  Respiratory: Effort normal and breath sounds normal. No respiratory distress. He has no wheezes. i heard no rales GI: Soft. Bowel sounds are normal. He exhibits no distension. There is no tenderness. There is no rebound and no guarding.  PEG tube (foley) in place--skin unchanged.   Genitourinary:   Musculoskeletal: He exhibits no edema.  Neurological: Increased muscle tone in  b/l UE.   Moves all 4's. No resting tone. Able to volitionally move exts. Appears less distracted  Skin:  Numerous surgical scars on both LE's. All wounds essentially healed in both legs.  Bilateral heel dressings in place     Medical Problem List and Plan: 1. 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  2. DVT Prophylaxis/Anticoagulation: Subcutaneous Lovenox as well as IVC filter 01/12/2015---continue 3. Pain Management: Baclofen 10 mg 3 times a day,  ,Hycet as needed.  -reduce fentanyl to with next change 4. Mood/behavior/cognition: increased ritalin to .  Inderal 100 mg 3 times a day,   Klonopin to 1 mg twice a day.  -sleep much improved  -continue seroquel qhs but reduce to   -continue sleep chart  -limiting day time neuro-sedating meds as possible   5. Dysphagia:  -Gastrostomy tube 01/18/2015   -now on bolus feeding schedule  -continue foley cath for now in place of g-tube which was accidentally pulled out  -for MBS today 6. Large tension pneumothorax on the right with multiple rib fractures/pulmonary  -?LLL pneumonia---CXR improved, clinically improved also  -continue levaquin for now given recent bolus feed intolerance.  -aspiration precautions  -decannulated without issue, stoma closing 7. Multiple orthopedic fractures, L1-2 transverse process fractures,/left femur fracture with IM nailing, left and right open tibia fibular fractures, transverse acetabulum fracture status post percutaneous pinning,CM pelvic ring status post ORIF, pubic symphysis and trans-sacral screw forL  S I diastasis and right sacralalo fracture. Nonweightbearing bilateral lower extremities 8 weeks until 03/17/2015 8. Urine retention/neurogenic bladder: flomax trial, urecholine added also  I/O CATH prn  -encourage spontaneous voids as possible, timed voids   LOS (Days) 12 A FACE TO FACE  EVALUATION WAS PERFORMED  Juan [name] 02/25/2015 8:50 AM

## 2015-02-26 ENCOUNTER — Inpatient Hospital Stay (HOSPITAL_COMMUNITY): Payer: Self-pay | Admitting: Physical Therapy

## 2015-02-26 ENCOUNTER — Inpatient Hospital Stay (HOSPITAL_COMMUNITY): Payer: 59 | Admitting: Speech Pathology

## 2015-02-26 ENCOUNTER — Inpatient Hospital Stay (HOSPITAL_COMMUNITY): Payer: 59 | Admitting: Occupational Therapy

## 2015-02-26 DIAGNOSIS — R569 Unspecified convulsions: Secondary | ICD-10-CM

## 2015-02-26 DIAGNOSIS — G259 Extrapyramidal and movement disorder, unspecified: Secondary | ICD-10-CM

## 2015-02-26 DIAGNOSIS — R1314 Dysphagia, pharyngoesophageal phase: Secondary | ICD-10-CM

## 2015-02-26 DIAGNOSIS — T791XXS Fat embolism (traumatic), sequela: Secondary | ICD-10-CM

## 2015-02-26 DIAGNOSIS — S3282XS Multiple fractures of pelvis without disruption of pelvic ring, sequela: Secondary | ICD-10-CM

## 2015-02-26 MED ORDER — ENSURE ENLIVE PO LIQD
237.0000 mL | Freq: Two times a day (BID) | ORAL | Status: DC
  Administered 2015-02-26 – 2015-03-01 (×7): 237 mL via ORAL

## 2015-02-26 MED ORDER — FREE WATER
50.0000 mL | Freq: Three times a day (TID) | Status: DC
  Administered 2015-02-26 – 2015-03-02 (×11): 50 mL

## 2015-02-26 NOTE — Progress Notes (Signed)
Calorie Count Note  48 hour calorie count ordered.  Diet: Dysphagia 1 diet with nectar thick liquids  Day 1 of calorie count: Breakfast: 341 kcal, 14 grams of protein Lunch: 15 kcal, 2 grams of protein Dinner: 602 kcal, 17 grams of protein  Day 1 Total intake: 958 kcal (42% of minimum estimated needs)  33 grams of protein (24% of minimum estimated needs)  Day 2 of calorie count: Breakfast: 652 kcal, 19 grams of protein Lunch: 425 kcal, and 16 grams of protein Dinner: 462 kcal, and 17 grams of protein  Day 2 Total intake: 1539 kcal (67% of minimum estimated needs)  52 grams of protein (39% of minimum estimated needs)  Estimated Nutritional Needs:  Kcal: 2300-2500 Protein: 135-145 grams Fluid: 2.3-2.5 L  Intake has been improving. Pt has been advanced to dysphagia 1 diet with thin liquids. RD to order Ensure to aid in caloric and protein needs. Meal completion has been at least 50% at each meals. Spoke with RN that if meal completion is <50% then to administer a bolus tube feed. RD to continue with Prostat per tube to ensure adequate protein intake. RD to additionally modify free water flushes as pt is consuming fluids adequately. RD to continue to monitor.   Nutrition Dx:  Increased nutrient needs related to wound healing as evidenced by estimated needs; ongoing  Goal:  Pt to meet >/= 90% of their estimated nutrition needs; progressing Intervention:   Discontinue calorie count.   Let patient attempt to eat at meals. If patient consumes < 50% at meals, provide a bolus tube feed of 340 ml Jevity 1.2 via foley catheter in stomach.   Provide 30 ml Prostat  TID per tube, each dose provides 100 kcal and 15 grams of protein.   Provide Ensure Enlive po BID, each supplement provides 350 kcal and 20 grams of protein.  Provide free water flushes 50 ml TID.  Encourage adequate PO intake.   Roslyn Smiling, MS, RD, LDN Pager # [phone] After hours/ weekend pager #  [phone]

## 2015-02-26 NOTE — Progress Notes (Signed)
Speech Language Pathology Daily Session Notes  Patient Details  Name: Juan [name] MRN: [id number] Date of Birth: 07-15-1990  Today's Date: 02/26/2015  Session 1: SLP Individual Time: 1000-1100 SLP Individual Time Calculation (min): 60 min   Session 2: SLP Individual Time: 1500-1530 SLP Individual Time Calculation (min): 30 min  Short Term Goals: Week 2: SLP Short Term Goal 1 (Week 2): Patient will consume trials of Dys. 1 textures without overt s/s of aspiration over 3 consecutive sesions with Max A multimodal cues.  SLP Short Term Goal 2 (Week 2): Patient will consume trials of thin liquids with minimal overt s/s of aspiration with Max A multimodal cues.  SLP Short Term Goal 3 (Week 2): Patient will express basic wants/needs in 75% of opportunities with Max A multimodal cues.  SLP Short Term Goal 4 (Week 2): Patient will vocalize with Max A multimodal cues in 25% of opportunities  SLP Short Term Goal 5 (Week 2): Patient will initiate functional tasks in 50% of opportunities with Max A multimodal cues.  SLP Short Term Goal 6 (Week 2): Patient will sustain attention to a task for 60 seconds with Max A multimodal cues.   Skilled Therapeutic Interventions:  Session 1: Skilled treatment session focused on dysphagia and cognitive goals. SLP facilitated session by providing skilled observation with snack of Dys. 1 textures and trials of thin liquids via straw. Patient consumed snack and liquids without overt s/s of aspiration, therefore, recommend patient upgrade to thin liquids. Patient demonstrated increased initiation of reaching with his LUE and grabbing/picking up his cup throughout trials but continues to require hand over hand assist for self-feeding. Patient perseverative on water throughout the session and required Max A multimodal cues for redirection to a structured language task with decreased frustration tolerance.  Patient left upright in wheelchair with all needs within reach and  family present. Continue with current plan of care.    Session 2: Skilled treatment session focused on cognitive goals. SLP facilitated session by providing music of patient's interest/choice with focus on phonation, however, attempts unsuccessful. Patient continues to demonstrate decreased frustration tolerance with structured tasks. Patient left upright in wheelchair with all needs within reach. Continue with current plan of care.   Function:  Eating Eating   Modified Consistency Diet: Yes Eating Assist Level: Hand over hand assist;Help managing cup/glass     Helper Scoops Food on Utensil: Every scoop     Cognition Comprehension Comprehension assist level: Understands basic 25 - 49% of the time/ requires cueing 50 - 75% of the time  Expression   Expression assist level: Expresses basic 25 - 49% of the time/requires cueing 50 - 75% of the time. Uses single words/gestures.  Social Interaction Social Interaction assist level: Interacts appropriately 25 - 49% of time - Needs frequent redirection.  Problem Solving Problem solving assist level: Solves basic less than 25% of the time - needs direction nearly all the time or does not effectively solve problems and may need a restraint for safety  Memory Memory assist level: Recognizes or recalls less than 25% of the time/requires cueing greater than 75% of the time    Pain Pain Assessment Pain Assessment: No/denies pain  Therapy/Group: Individual Therapy  Georgene Kopper 02/26/2015, 4:25 PM

## 2015-02-26 NOTE — Progress Notes (Signed)
Physical Therapy Session Note  Patient Details  Name: Juan [name] MRN: [id number] Date of Birth: 1990-06-15  Today's Date: 02/26/2015 PT Individual Time: 1257-1357 PT Individual Time Calculation (min): 60 min   Short Term Goals: Week 2:  PT Short Term Goal 1 (Week 2): Patient will sustain attention to functional tasks x 60 sec with max multimodal cues.  PT Short Term Goal 2 (Week 2): Patient will initiate functional tasks 50% of available opportunities with max multimodal cues.  PT Short Term Goal 3 (Week 2): Patient will maintain sitting balance with assist of one person x 15 min.    Therapy Documentation Precautions:  Precautions Precautions: Fall, Other (comment) Precaution Comments: BLE NWB; trach, peg Required Braces or Orthoses: Other Brace/Splint Other Brace/Splint: B resting hand splints; B PRAFO Restrictions Weight Bearing Restrictions: Yes RLE Weight Bearing: Non weight bearing LLE Weight Bearing: Non weight bearing    Pain: Pain Assessment Pain Assessment: No/denies pain  Patient received seated in tilt and space wheelchair with quick release belt engaged.   Patient performed slide board transfer NWB BLE to and from wheelchair and mat total assistx1   Patient sat edge of mat for two trials of twenty minutes focusing on upright posture, positioning in midline, even weight distribution and upper extremity coordination. Patient varies between mod assist and total assist with +2 assist for task orient activites. Patient utilized Geologist, engineering for visual feedback and midline orientation minimally effective during treatment. Patient responded to tactile and one step verbal cues for midline orientation. Patient side sitting on right and left elbow 5x each for a 10 second hold. Patient performed reaching tasks with bilateral upper extremities.  Short sit to and from supine on mat max assist. Patient performed rolling to left side lying with close supervision. Patient performed  supine to right side lying min assist.    Patient left in room at end of session seated in tilt in space wheelchair with all needs met and quick release belt engaged family present at end of session and family education provided on goals and outcomes of therapy session today as well as plan of care.   See Function Navigator for Current Functional Status.   Therapy/Group: Individual Therapy  Retta Diones 02/26/2015, 2:02 PM

## 2015-02-26 NOTE — Progress Notes (Signed)
[facility]     PROGRESS NOTE    Subjective/Complaints:  Pt seen and examined this AM soon after waking up.  Family at bedside.  Family states pt is doing very well.  They mention he say for most of the day yesterday and slept for ~9 hours last night.    ROS --cannot perform due to cognitive status  Objective: Vital Signs: Blood pressure 124/72, pulse 86, temperature 98.5 F (36.9 C), temperature source Oral, resp. rate 18, height 5\' 6"  (1.676 m), weight 67.223 kg (148 lb 3.2 oz), SpO2 100 %. No results found. No results for input(s): WBC, HGB, HCT, PLT in the last 72 hours. No results for input(s): NA, K, CL, GLUCOSE, BUN, CREATININE, CALCIUM in the last 72 hours.  Invalid input(s): CO CBG (last 3)  No results for input(s): GLUCAP in the last 72 hours.  Wt Readings from Last 3 Encounters:  02/26/15 67.223 kg (148 lb 3.2 oz)  02/12/15 73.936 kg (163 lb)  07/31/12 63.504 kg (140 lb)    Physical Exam:  Constitutional: He appears well-developed and well-nourished.  Alert.  HENT:  Head: Normocephalic and atraumatic.  Right Ear: External ear normal.  Left Ear: External ear normal.  Eyes: Right eye exhibits no discharge. Left eye exhibits no discharge. No scleral icterus. EOMI.  Neck: Normal range of motion. Neck supple. No JVD present. No tracheal deviation present. No thyromegaly present.  Janina Mayo site still open externally Cardiovascular: Normal rate and regular rhythm.  Respiratory: Effort normal and breath sounds normal. No respiratory distress. He has no wheezes. i heard no rales GI: Soft. Bowel sounds are normal. He exhibits no distension. There is no tenderness. There is no rebound and no guarding.  PEG tube (foley) in place--skin unchanged.   Genitourinary:  Foley out  Musculoskeletal: He exhibits no edema.  Neurological: Increased muscle tone in b/l UE, although difficult to assess due ?pt's resistance.  Moves all 4's. No resting  tone. Still some primitive posturing. Able to volitionally move exts. Appears less distracted  Skin:  Numerous surgical scars on both LE's. All wounds essentially healed in both legs. Has foam dressings on to protect heels. Inconsistently able to follow 1 step commands Psychiatric:  Remains internally distracted but less but activity subsiding.  Assessment/Plan: 1. 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 Physiatrist is providing close team supervision and 24 hour management of active medical problems listed below. Physiatrist and rehab team continue to assess barriers to discharge/monitor patient progress toward functional and medical goals.  Function:  Bathing Bathing position   Position: Bed  Bathing parts   Body parts bathed by helper: Right arm, Left arm, Chest, Abdomen, Front perineal area, Right upper leg, Buttocks, Left upper leg, Right lower leg, Left lower leg, Back  Bathing assist Assist Level: 2 helpers      Upper Body Dressing/Undressing Upper body dressing   What is the patient wearing?: Pull over shirt/dress       Pull over shirt/dress - Perfomed by helper: Thread/unthread right sleeve, Thread/unthread left sleeve, Put head through opening, Pull shirt over trunk        Upper body assist        Lower Body Dressing/Undressing Lower body dressing   What is the patient wearing?: Underwear, Pants, Socks   Underwear - Performed by helper: Thread/unthread right underwear leg, Thread/unthread left underwear leg, Pull underwear up/down  Pants- Performed by helper: Thread/unthread right pants leg, Thread/unthread left pants leg, Pull pants up/down   Non-skid slipper socks- Performed by helper: Don/doff right sock, Don/doff left sock   Socks - Performed by helper: Don/doff right sock, Don/doff left sock              Lower body assist Assist for  lower body dressing: 2 Helpers      Toileting Toileting Toileting activity did not occur: No continent bowel/bladder event   Toileting steps completed by helper: Adjust clothing prior to toileting, Performs perineal hygiene, Adjust clothing after toileting    Toileting assist Assist level: Two helpers   Transfers Chair/bed transfer   Chair/bed transfer method: Lateral scoot Chair/bed transfer assist level: 2 helpers Chair/bed transfer assistive device: Sliding board Mechanical lift: Maximove   Locomotion Ambulation Ambulation activity did not occur: Safety/medical concerns (BLE NWB)         Wheelchair Wheelchair activity did not occur: Safety/medical concerns (low level TBI) Type: Manual   Assist Level: Dependent (Pt equals 0%)  Cognition Comprehension Comprehension assist level: Understands basic 25 - 49% of the time/ requires cueing 50 - 75% of the time  Expression Expression assist level: Expresses basic 25 - 49% of the time/requires cueing 50 - 75% of the time. Uses single words/gestures.  Social Interaction Social Interaction assist level: Interacts appropriately 25 - 49% of time - Needs frequent redirection.  Problem Solving Problem solving assist level: Solves basic less than 25% of the time - needs direction nearly all the time or does not effectively solve problems and may need a restraint for safety  Memory Memory assist level: Recognizes or recalls less than 25% of the time/requires cueing greater than 75% of the time   Medical Problem List and Plan: 1. 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 Activity tolerance improving 2. DVT Prophylaxis/Anticoagulation: Subcutaneous Lovenox as well as IVC filter 01/12/2015---continue 3. Pain Management: Baclofen 10 mg 3 times a day,  ,Hycet as needed.  -reduced fentanyl to with  next change 4. Mood/behavior/cognition: increased ritalin to .  Inderal 100 mg 3 times a day, Klonopin to 1 mg twice a day.  -sleep improved, ~9 hours last night  -continue seroquel   -continue sleep chart  -limiting day time neuro-sedating meds as possible 5. Neuropsych: This patient is not capable of making decisions on his own behalf. 6. Skin/Wound Care: Routine skin checks 7. Fluids/Electrolytes/Nutrition: continue to monitor labs.  8. Uncontrollable rhythmic movements/spasticity. Question related to anti-psychotic medications and adjustments made. 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 Dysphagia:  -Gastrostomy tube 01/18/2015   -now on bolus feeding schedule  -continue foley cath for now in place of g-tube which was accidentally pulled out  -for MBS today  -Pt eating 75-80% of meals 11. Large tension pneumothorax on the right with multiple rib fractures/pulmonary  -?LLL pneumonia---CXR improved, clinically improved also  -continue levaquin for now given recent bolus feed intolerance.  -aspiration precautions  -decannulated without issue, stoma closing 12. Multiple orthopedic fractures, L1-2 transverse process fractures,/left femur fracture with IM nailing, left and right open tibia fibular fractures, transverse acetabulum fracture status post percutaneous pinning,CM pelvic ring status post ORIF, pubic symphysis and  trans-sacral screw forL S I diastasis and right sacralalo fracture. Nonweightbearing bilateral lower extremities 8 weeks until 03/17/2015 13. Urine retention/neurogenic bladder: flomax trial, urecholine added also  I/O CATH prn  -encourage spontaneous voids as possible  -timed voids   LOS (Days) 13 A FACE TO FACE  EVALUATION WAS PERFORMED  Ceazia Harb Karis Juba 02/26/2015 1:45 PM

## 2015-02-26 NOTE — Progress Notes (Signed)
Occupational Therapy Session Note  Patient Details  Name: Juan [name] MRN: [id number] Date of Birth: [birth year]  Today's Date: 02/26/2015 OT Individual Time: 1027-2536 OT Individual Time Calculation (min): 56 min    Short Term Goals: Week 2:  OT Short Term Goal 1 (Week 2): Pt will sit EOB with min A for 15 min to participate in BADLs OT Short Term Goal 2 (Week 2): Pt will wash face with min A and min verbal cues to initiate task OT Short Term Goal 3 (Week 2): Pt will perform UB dressing tasks with max A OT Short Term Goal 4 (Week 2): Pt will exhibit sustained attention for 1 min with mod verbal cues  Skilled Therapeutic Interventions/Progress Updates:    Pt seen for BADL retraining of dressing, grooming with a focus on attention, awareness, following 1 step directions, trunk control and use of BUE. Pt seen with rehab tech for physical support as pt needs mod-max A with sitting balance, he was able to tolerate 15-20 min EOB but did need constant support.  With dressing, hand over hand guiding to assist with pulling shirt down over trunk and shorts over hips. Pt transferred to w/c via slide board to complete grooming at sink with cues to fully brush teeth and use mouth rinse. Hand over hand guiding to wash face. Pt then engaged in BUE A/AROM exercises with short bouts of attention.  Pt complained of L forearm discomfort with light pressure on arm. He was more comfortable with ROM with hands at upper arm.  Pt was somewhat irritable and expressing frustration throughout the session. Pt nodded yes when asked if he was upset that his regular therapist was not here.  Praised pt for working well with this therapist even though he was not familiar with me.  Pt in calm mood at end of session. Pt's family returned to room. Pt in w/c with quick release belt and family in the room.  Therapy Documentation Precautions:  Precautions Precautions: Fall, Other (comment) Precaution Comments: BLE NWB; trach,  peg Required Braces or Orthoses: Other Brace/Splint Other Brace/Splint: B resting hand splints; B PRAFO Restrictions Weight Bearing Restrictions: Yes RLE Weight Bearing: Non weight bearing LLE Weight Bearing: Non weight bearing       Pain: Pain Assessment Pain Assessment: No/denies pain Pain Score: 0-No pain ADL: ADL ADL Comments: see FIM See Function Navigator for Current Functional Status.   Therapy/Group: Individual Therapy  Zakariye Nee 02/26/2015, 12:36 PM

## 2015-02-27 ENCOUNTER — Inpatient Hospital Stay (HOSPITAL_COMMUNITY): Payer: 59 | Admitting: *Deleted

## 2015-02-27 ENCOUNTER — Inpatient Hospital Stay (HOSPITAL_COMMUNITY): Payer: 59 | Admitting: Physical Therapy

## 2015-02-27 ENCOUNTER — Inpatient Hospital Stay (HOSPITAL_COMMUNITY): Payer: 59 | Admitting: Speech Pathology

## 2015-02-27 NOTE — Progress Notes (Signed)
Patient able to verbalize needs to staff. Patient able to verbalize need to have bowel movement to RN and with assistance from two staff members, and slideboard was transfer to [facility]. Able to verbalize need to urinate and decrease in & out caths to one every 12hrs. versus every 6 hours. Patient is able to follow commands from staff. Family is able to stay with patient 24/7 which allows patient to stay in a regular bed. Family and patient are able to assist nursing with turning patient which decreases the need for an air mattress. Patient is now progressing to swallowing some medications crushed in pudding. Continue with plan of care.
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
## 2015-02-27 NOTE — Progress Notes (Addendum)
Physical Therapy Session Note  Patient Details  Name: Juan [name] MRN: [id number] Date of Birth: [birth year]  Today's Date: 02/27/2015 PT Individual Time: 0915-1000 and 1600-1640 PT Individual Time Calculation (min): 45 min and 40 min   Short Term Goals: Week 2:  PT Short Term Goal 1 (Week 2): Patient will sustain attention to functional tasks x 60 sec with max multimodal cues.  PT Short Term Goal 2 (Week 2): Patient will initiate functional tasks 50% of available opportunities with max multimodal cues.  PT Short Term Goal 3 (Week 2): Patient will maintain sitting balance with assist of one person x 15 min.   Skilled Therapeutic Interventions/Progress Updates:   Session 1: Focus on seated upright posture, positioning in midline, even weight distribution, upper extremity coordination, initiation, and command following. Patient missed 15 min at beginning of session due to nursing care for catheterization. Patient supine in bed, requesting to get OOB. Patient required more than reasonable time and max multimodal cues for sequencing and technique to roll to L, after multiple attempts required assist to bring BLE off bed, and patient able to elevate trunk by pushing up with LUE with min A. Patient performed slide board transfer bed > wheelchair while maintaining NWB BLE, +2A to position board correctly then total A x 1 for actual transfer. Sitting in tilt in space wheelchair in full upright position, patient able to follow commands for functional BUE and anterior weight shifting tasks 100% of time with mod verbal cuing. In upright position, patient required max verbal/visual/tactile cuing to maintain midline positioning of head, neck, and trunk for brief periods of 2-3 seconds, consistently able to initiate movement toward correction and follow commands. Patient with decreased frustration tolerance towards end of session due to anticipated decreased tolerance for frequent verbal commands and  overstimulation of multiple people in room (nurse tech, nurse, girlfriend, therapist, etc). Patient left sitting in wheelchair with quick release belt on with girlfriend present.   Session 2: Focus on initiation of wheelchair propulsion, sitting tolerance, upright posture, midline orientation, and command following. Patient received in tilt in space wheelchair with family present. Retrieved manual 16x16 wheelchair, cushion, and standard leg rests to trial with patient. Patient's mother present and assisted with transfers during session. Performed slide board transfer from TIS wheelchair > mat table > manual wheelchair with +2 assist required to place and stabilize slide board, max-total A of one person to perform actual transfers with patient demonstrating good participation with forward lean/head hips relationship. Seated on mat, patient maintained static sitting balance with min A while mother set up wheelchair for transfer. Patient instructed in wheelchair propulsion using BUE (due to NWB BLE) with consistent hand-over-hand assist to correctly position BUE on wheelchair rims initially as patient frequently attempting to propel by locking/unlocking brakes, then patient able to propel up to 3-4 ft before requiring cuing for technique and sustained attention to task in moderately distracting controlled environment of hallway. Patient able to propel 25 ft at a time before requiring rest break, for a total of approx 100 ft with supervision-min A. Patient demonstrated excellent sitting tolerance in manual wheelchair during session with quick release belt in place. Educated patient's mother and family and nursing staff that patient to use manual wheelchair with therapy only at this time to allow for adequate rest between therapies in tilt in space wheelchair at this time, all verbalized understanding and in agreement. Patient continues to make steady progress with initiation, sustained attention, ability to follow  commands, sitting balance,  postural control, and activity tolerance. Continue current plan of care.   Therapy Documentation Precautions:  Precautions Precautions: Fall, Other (comment) Precaution Comments: BLE NWB; trach, peg Required Braces or Orthoses: Other Brace/Splint Other Brace/Splint: B resting hand splints; B PRAFO Restrictions Weight Bearing Restrictions: Yes RLE Weight Bearing: Non weight bearing LLE Weight Bearing: Non weight bearing General: PT Amount of Missed Time (min): 15 Minutes PT Missed Treatment Reason: Nursing care (cath) Pain: Pain Assessment Pain Assessment: No/denies pain  See Function Navigator for Current Functional Status.   Therapy/Group: Individual Therapy  Kerney Elbe 02/27/2015, 10:05 AM

## 2015-02-27 NOTE — Progress Notes (Signed)
Speech Language Pathology Weekly Progress and Session Notes  Patient Details  Name: Juan [name] MRN: [id number] Date of Birth: 12-04-1990  Beginning of progress report period: February 20, 2015 End of progress report period: February 27, 2015  Today's Date: 02/27/2015 SLP Individual Time: 1300-1400 SLP Individual Time Calculation (min): 60 min  Short Term Goals: Week 2: SLP Short Term Goal 1 (Week 2): Patient will consume trials of Dys. 1 textures without overt s/s of aspiration over 3 consecutive sesions with Max A multimodal cues.  SLP Short Term Goal 1 - Progress (Week 2): Met SLP Short Term Goal 2 (Week 2): Patient will consume trials of thin liquids with minimal overt s/s of aspiration with Max A multimodal cues.  SLP Short Term Goal 2 - Progress (Week 2): Met SLP Short Term Goal 3 (Week 2): Patient will express basic wants/needs in 75% of opportunities with Max A multimodal cues.  SLP Short Term Goal 3 - Progress (Week 2): Met SLP Short Term Goal 4 (Week 2): Patient will vocalize with Max A multimodal cues in 25% of opportunities  SLP Short Term Goal 4 - Progress (Week 2): Not met SLP Short Term Goal 5 (Week 2): Patient will initiate functional tasks in 50% of opportunities with Max A multimodal cues.  SLP Short Term Goal 5 - Progress (Week 2): Met SLP Short Term Goal 6 (Week 2): Patient will sustain attention to a task for 60 seconds with Max A multimodal cues.  SLP Short Term Goal 6 - Progress (Week 2): Met    New Short Term Goals: Week 3: SLP Short Term Goal 1 (Week 3): Patient will sustain attention to a task for 5 minutes with Max A multimodal cues.  SLP Short Term Goal 2 (Week 3): Patient will initiate functional tasks in 90% of opportunities with Mod A multimodal cues.  SLP Short Term Goal 3 (Week 3): Patient will vocalize with Max A multimodal cues in 25% of opportunities  SLP Short Term Goal 4 (Week 3): Patient will express basic wants/needs in 90% of opportunities with  Mod A multimodal cues.  SLP Short Term Goal 5 (Week 3): Patient will consume trials of Dys. 2 textures without overt s/s of aspiration over 2 consecutive sesions with Max A multimodal cues.  SLP Short Term Goal 6 (Week 3): Patient will consume current diet with minimal overt s/s of aspiration with Mod A verbal cues for use of swallowing compensatory strategies.   Weekly Progress Updates: Patient has made functional gains and has met 5 of 6 STG's this reporting period due to increased cognitive-linguistic and swallowing function. Patient had a FEES on 10/7 and recommended Dys. 1 textures with nectar-thick liquids, patient has since been upgraded to thin liquids. Patient is consuming diet without overt s/s of aspiration and requires hand over hand assist to self-feed textures and intermittent assistance with managing a cup. Patient is verbalizing at the word and phrase level in 75% of opportunities but remains aphonic, suspect due to cognitive impairments.  Patient is initiating functional tasks and following 1-step commands in 75% of opportunities with extra time and Mod-Max A multimodal cues. Patient can also sustain attention to functional tasks for 60 seconds with Mod A multimodal cues. Family education is ongoing. Patient would benefit from continued skilled SLP intervention to maximize swallowing and cognitive function and functional communication in order to maximize functional independence and reduce caregiver burden prior to discharge. Continue with current plan of care.   Intensity: Minumum of 1-2 x/day,  30 to 90 minutes Frequency: 3 to 5 out of 7 days Duration/Length of Stay: 03/20/15 Treatment/Interventions: English as a second language teacher;Dysphagia/aspiration precaution training;Environmental controls;Functional tasks;Therapeutic Activities;Patient/family education;Cognitive remediation/compensation;Internal/external aids;Speech/Language facilitation   Daily Session  Skilled Therapeutic Interventions:  Skilled treatment session focused on cognitive and dysphagia goals. Upon arrival, patient was upright in wheelchair and became emotional when family members left the room.  Clinician provided emotional support and environment modifications which appeared to make the patient brighter. However, unable to determine the patient's reason for lability today. Patient declined lunch but consumed snack of Dys. 1 textures with thin liquids via straw without overt s/s of aspiration and required hand over hand assist for self-feeding of Dys. 1 textures. Trials of upgraded textures were not attempted today due to lability.  Patient's mother provided education in regards to the patient's current cognitive-linguistic and swallowing function and goals of skilled SLP intervention. She verbalized understanding and asked appropriate questions. Patient left upright in wheelchair with family present and all needs within reach. Continue with current plan of care.       Function:   Eating Eating   Modified Consistency Diet: Yes Eating Assist Level: Help managing cup/glass;Assistive Device;Hand over hand assist           Cognition Comprehension Comprehension assist level: Understands basic 25 - 49% of the time/ requires cueing 50 - 75% of the time  Expression   Expression assist level: Expresses basic 25 - 49% of the time/requires cueing 50 - 75% of the time. Uses single words/gestures.  Social Interaction Social Interaction assist level: Interacts appropriately 25 - 49% of time - Needs frequent redirection.  Problem Solving Problem solving assist level: Solves basic less than 25% of the time - needs direction nearly all the time or does not effectively solve problems and may need a restraint for safety  Memory Memory assist level: Recognizes or recalls less than 25% of the time/requires cueing greater than 75% of the time   Pain No/Denies Pain   Therapy/Group: Individual Therapy  [name] 02/27/2015, 3:45  PM
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
## 2015-02-27 NOTE — Progress Notes (Signed)
Occupational Therapy Session Note  Patient Details  Name: Juan [name] MRN: [id number] Date of Birth: 1991/03/06  Today's Date: 02/27/2015 OT Individual Time: 0800-0900 OT Individual Time Calculation (min): 60 min    Short Term Goals: Week 2:  OT Short Term Goal 1 (Week 2): Pt will sit EOB with min A for 15 min to participate in BADLs OT Short Term Goal 2 (Week 2): Pt will wash face with min A and min verbal cues to initiate task OT Short Term Goal 3 (Week 2): Pt will perform UB dressing tasks with max A OT Short Term Goal 4 (Week 2): Pt will exhibit sustained attention for 1 min with mod verbal cues  Skilled Therapeutic Interventions/Progress Updates:    Pt resting in bed with girlfriend present.  Pt's girlfriend indicated that pt wanted to take shower.  Pt's gf also indicated that pt was tired this moWhen questioned, pt stated that he did want to take a shower. [name] move used for transfer to rolling shower chair.  Pt engaged in bathing at shower level with focus on task initiation and increased BUE use for functional tasks. Pt continues to require max verbal cues for task initiation and max A for sitting balance when initiating use of BUE for functional tasks.  Pt engaged trunk when attempting to use BUE requiring assistance to maintain balance.  Pt fatigued quickly throughout session requiring increased assistance.  Pt returned to bed to complete dressing tasks.  Pt initiated rolling side to side to facilitate LB dressing tasks.  Focus on activity tolerance, sitting balance, task initiation, sequencing, cognitive remediation.  Therapy Documentation Precautions:  Precautions Precautions: Fall, Other (comment) Precaution Comments: BLE NWB; trach, peg Required Braces or Orthoses: Other Brace/Splint Other Brace/Splint: B resting hand splints; B PRAFO Restrictions Weight Bearing Restrictions: Yes RLE Weight Bearing: Non weight bearing LLE Weight Bearing: Non weight bearing Pain:  Pt  with s/s of pain throughout session ADL: ADL ADL Comments: see FIM  See Function Navigator for Current Functional Status.   Therapy/Group: Individual Therapy  [name] 02/27/2015, 9:02 AM

## 2015-02-27 NOTE — Progress Notes (Signed)
Social Work Patient ID: Juan [name], male   DOB: 1990-06-22, 24 y.o.   MRN: [id number]   Received message from Collie Siad with [facility] today stating that they have approved continued coverage for pt through 10/17 and request update on that day.  Collie Siad notes that records were reviewed by Medical Director and that pt is "at risk" for no further coverage beyond 10/17 if they deem that his needs could be met at a lower level of care (SNF). Collie Siad also directing me to inform pt/ family of this risk.  I have discussed with mother and she is aware.  Have also discussed with tx team about need to very clearly document ALL gains being made by pt to aid in securing continued coverage auth for CIR.  Deano Tomaszewski, LCSW

## 2015-02-28 ENCOUNTER — Inpatient Hospital Stay (HOSPITAL_COMMUNITY): Payer: 59 | Admitting: *Deleted

## 2015-02-28 ENCOUNTER — Inpatient Hospital Stay (HOSPITAL_COMMUNITY): Payer: 59 | Admitting: Physical Therapy

## 2015-02-28 ENCOUNTER — Inpatient Hospital Stay (HOSPITAL_COMMUNITY): Payer: Self-pay

## 2015-02-28 ENCOUNTER — Inpatient Hospital Stay (HOSPITAL_COMMUNITY): Payer: 59 | Admitting: Speech Pathology

## 2015-02-28 MED ORDER — CLONAZEPAM 0.5 MG PO TABS
0.5000 mg | ORAL_TABLET | Freq: Two times a day (BID) | ORAL | Status: DC
  Administered 2015-02-28 – 2015-03-09 (×18): 0.5 mg via ORAL
  Filled 2015-02-28 (×18): qty 1

## 2015-02-28 MED ORDER — LEVETIRACETAM 750 MG PO TABS: 1500.0000 mg | ORAL_TABLET | Freq: Two times a day (BID) | ORAL | Status: DC

## 2015-02-28 MED ORDER — POLYETHYLENE GLYCOL 3350 17 G PO PACK
17.0000 g | PACK | ORAL | Status: DC | PRN
  Administered 2015-03-10 – 2015-03-11 (×2): 17 g via ORAL
  Filled 2015-02-28 (×2): qty 1

## 2015-02-28 MED ORDER — PROPRANOLOL HCL 20 MG PO TABS
80.0000 mg | ORAL_TABLET | Freq: Three times a day (TID) | ORAL | Status: DC
  Administered 2015-02-28 – 2015-03-14 (×34): 80 mg via ORAL
  Filled 2015-02-28 (×6): qty 4
  Filled 2015-02-28: qty 8
  Filled 2015-02-28 (×6): qty 4
  Filled 2015-02-28: qty 8
  Filled 2015-02-28 (×4): qty 4
  Filled 2015-02-28: qty 8
  Filled 2015-02-28: qty 4
  Filled 2015-02-28: qty 8
  Filled 2015-02-28 (×17): qty 4

## 2015-02-28 MED ORDER — HYDROCODONE-ACETAMINOPHEN 5-325 MG PO TABS
1.0000 | ORAL_TABLET | Freq: Four times a day (QID) | ORAL | Status: DC | PRN
  Administered 2015-03-02 – 2015-03-19 (×6): 1 via ORAL
  Filled 2015-02-28 (×6): qty 1

## 2015-02-28 MED ORDER — METHYLPHENIDATE HCL 5 MG PO TABS
10.0000 mg | ORAL_TABLET | Freq: Two times a day (BID) | ORAL | Status: DC
  Administered 2015-02-28 – 2015-03-01 (×2): 10 mg via ORAL
  Filled 2015-02-28 (×3): qty 2

## 2015-02-28 MED ORDER — QUETIAPINE FUMARATE 50 MG PO TABS
75.0000 mg | ORAL_TABLET | Freq: Every day | ORAL | Status: DC
  Administered 2015-02-28: 75 mg via ORAL
  Filled 2015-02-28 (×2): qty 1

## 2015-02-28 MED ORDER — LEVETIRACETAM 750 MG PO TABS
1500.0000 mg | ORAL_TABLET | Freq: Two times a day (BID) | ORAL | Status: DC
  Administered 2015-02-28 – 2015-03-03 (×7): 1500 mg via ORAL
  Filled 2015-02-28 (×8): qty 2

## 2015-02-28 MED ORDER — BETHANECHOL CHLORIDE 10 MG PO TABS
10.0000 mg | ORAL_TABLET | Freq: Three times a day (TID) | ORAL | Status: DC
  Administered 2015-02-28 – 2015-03-16 (×48): 10 mg via ORAL
  Filled 2015-02-28 (×48): qty 1

## 2015-02-28 MED ORDER — PANTOPRAZOLE SODIUM 40 MG PO PACK
40.0000 mg | PACK | Freq: Every day | ORAL | Status: DC
  Administered 2015-03-01 – 2015-03-06 (×6): 40 mg via ORAL
  Filled 2015-02-28 (×6): qty 20

## 2015-02-28 MED ORDER — ACETAMINOPHEN 500 MG PO TABS
500.0000 mg | ORAL_TABLET | Freq: Four times a day (QID) | ORAL | Status: DC | PRN
  Administered 2015-03-02 – 2015-03-12 (×3): 500 mg via ORAL
  Filled 2015-02-28 (×3): qty 1

## 2015-02-28 NOTE — Progress Notes (Signed)
Physical Therapy Daily Progress Note  Patient Details  Name: Juan [name] MRN: [id number] Date of Birth: 05/29/1990   Today's Date: 02/28/2015 PT Individual Time: 0900-1000 PT Individual Time Calculation (min): 60 min   PT Short Term Goals Week 2:  PT Short Term Goal 1 (Week 2): Patient will sustain attention to functional tasks x 60 sec with max multimodal cues.  PT Short Term Goal 2 (Week 2): Patient will initiate functional tasks 50% of available opportunities with max multimodal cues.  PT Short Term Goal 3 (Week 2): Patient will maintain sitting balance with assist of one person x 15 min.   Skilled Therapeutic Interventions/Progress Updates:   Patient received in manual wheelchair with quick release belt, handoff from OT. Patient propelled wheelchair x 100 ft in moderately distracting controlled environment in straight path, requiring initial hand-over-hand assist to correctly position BUE on wheelchair rims 50% of time with patient able to propel with supervision overall. Patient required max multimodal cues for sustained attention and continuation of task to completion. Patient required max verbal cues to correct for turning too much to R and L with decreased ability to maintain straight path. Patient performed slide board transfer with max-total A of one person with +2 for safety from wheelchair <> mat table and wheelchair > bed at end of session. Sitting edge of mat, performed static > dynamic sitting balance with focus on grasp and release of football with patient able to "toss" football and retrieve from rehab tech within BOS with min-total A for sitting balance. Patient unable to safely reach outside BOS at this time in unsupported sitting. Patient noted to compensate with use of head/trunk to complete task. Patient remained non-verbal this session but answered "yes/no" questions appropriately with head nods. Of note, patient also appeared to have difficulty managing secretions towards  end of session. Patient requested to return to bed due to fatigue, left sidelying with 4 rails up and family notified of termination of session, patient progress, and patient location.    Therapy Documentation Precautions:  Precautions Precautions: Fall, Other (comment) Precaution Comments: BLE NWB; trach, peg Required Braces or Orthoses: Other Brace/Splint Other Brace/Splint: B resting hand splints; B PRAFO Restrictions Weight Bearing Restrictions: Yes RLE Weight Bearing: Non weight bearing LLE Weight Bearing: Non weight bearing Pain: Pain Assessment Pain Assessment: No/denies pain   See Function Navigator for Current Functional Status.  Therapy/Group: Individual Therapy  Kerney ElbeVarner, Khori Rosevear A 02/28/2015, 12:29 PM

## 2015-02-28 NOTE — Progress Notes (Signed)
Occupational Therapy Session Note  Patient Details  Name: Juan [name] MRN: [id number] Date of Birth: 11/27/1990  Today's Date: 02/28/2015 OT Individual Time: 0800-0900 OT Individual Time Calculation (min): 60 min    Short Term Goals: Week 2:  OT Short Term Goal 1 (Week 2): Pt will sit EOB with min A for 15 min to participate in BADLs OT Short Term Goal 2 (Week 2): Pt will wash face with min A and min verbal cues to initiate task OT Short Term Goal 3 (Week 2): Pt will perform UB dressing tasks with max A OT Short Term Goal 4 (Week 2): Pt will exhibit sustained attention for 1 min with mod verbal cues  Skilled Therapeutic Interventions/Progress Updates:    Pt resting in bed with grandmother present.  Pt stated he was ready to get OOB and get dressed.  Pt initiated sitting EOB and moved BLE to edge of bed before requiring assistance to bring them off bed in preparation to pushing up through LUE to sitting position.  Pt initiated pushing up and only required min A to complete task.  Pt requires steady A/close supervision for static sitting balance (unsupported) at EOB.  Pt requires mod A/max A for sitting balance when engaged in functional tasks (donning shirt).  Pt initiates threading BUE into shirt sleeves after shirt placed over head.  Pt was able to thread LUE into shirt sleeve without assistance this morning.  Pt required max A for lateral leans to facilitate pulling up pants over hips.  Pt transitioned to standard w/c to engage in grooming tasks at sink.  Pt was able to brush teeth this morning after toothbrush placed in R hand.  Pt assisted with shaving this morning and followed one step commands with 100% accuracy.  Pt transitioned to w/c mobility activities with patient propelling w/c to nurses station.  Pt required min multimodal cues to propel w/c with occasional HOH assistance.  Pt required mod verbal cues to attend to task.  Focus on increased activity tolerance, bed mobility, sitting  balance, BADL retraining, w/c mobility, functional transfers, task initiation, sequencing, and attention to increased independence with BADLs.  Therapy Documentation Precautions:  Precautions Precautions: Fall, Other (comment) Precaution Comments: BLE NWB; trach, peg Required Braces or Orthoses: Other Brace/Splint Other Brace/Splint: B resting hand splints; B PRAFO Restrictions Weight Bearing Restrictions: Yes RLE Weight Bearing: Non weight bearing LLE Weight Bearing: Non weight bearing Pain: Pain Assessment Pain Assessment: No/denies pain ADL: ADL ADL Comments: see FIM  See Function Navigator for Current Functional Status.   Therapy/Group: Individual Therapy  Rich BraveLanier, Juan [name] 02/28/2015, 9:08 AM
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
## 2015-02-28 NOTE — Progress Notes (Signed)
Speech Language Pathology Daily Session Note  Patient Details  Name: Juan [name] XXXHunt MRN: [id number] Date of Birth: 06/10/1990  Today'[name] Date: 02/28/2015 SLP Individual Time: 1300-1400 SLP Individual Time Calculation (min): 60 min  Short Term Goals: Week 3: SLP Short Term Goal 1 (Week 3): Patient will sustain attention to a task for 5 minutes with Max A multimodal cues.  SLP Short Term Goal 2 (Week 3): Patient will initiate functional tasks in 90% of opportunities with Mod A multimodal cues.  SLP Short Term Goal 3 (Week 3): Patient will vocalize with Max A multimodal cues in 25% of opportunities  SLP Short Term Goal 4 (Week 3): Patient will express basic wants/needs in 90% of opportunities with Mod A multimodal cues.  SLP Short Term Goal 5 (Week 3): Patient will consume trials of Dys. 2 textures without overt [name]/[name] of aspiration over 2 consecutive sesions with Max A multimodal cues.  SLP Short Term Goal 6 (Week 3): Patient will consume current diet with minimal overt [name]/[name] of aspiration with Mod A verbal cues for use of swallowing compensatory strategies.   Skilled Therapeutic Interventions: Skilled treatment session focused on cognitive and dysphagia goals. Upon arrival, patient was awake while supine in bed and reported he was incontinent of bowel. SLP facilitated session by providing extra time and Mod A verbal and tactile cues for bed mobility during self-care tasks. Patient [facility] then [facility] Patient declined meal and multiple snacks and reported that he was "thirsty," however, patient was agreeable to trials of Dys. 2 textures. Patient demonstrated mildly prolonged mastication with intermittent overt [name]/[name] of aspiration, suspect due to mixed consistencies, therefore, recommend patient continue current diet. Patient demonstrated increased ability to feed himself in regards to increased reaching and initiation with his LUE, ability to scoop trials onto his spoon  with his RUE and ability to manage his cup with BUE. Patient demonstrated verbal perseveration throughout the session and continues to be aphonic. Patient left upright in wheelchair with all needs within reach and girlfriend present. Continue with current plan.    Function:  Eating Eating   Modified Consistency Diet: Yes Eating Assist Level: Help managing cup/glass;Assistive Device;Hand over hand assist       Helper Brings Food to Mouth: Occasionally   Cognition Comprehension Comprehension assist level: Understands basic 25 - 49% of the time/ requires cueing 50 - 75% of the time  Expression   Expression assist level: Expresses basic 25 - 49% of the time/requires cueing 50 - 75% of the time. Uses single words/gestures.  Social Interaction Social Interaction assist level: Interacts appropriately 25 - 49% of time - Needs frequent redirection.  Problem Solving Problem solving assist level: Solves basic less than 25% of the time - needs direction nearly all the time or does not effectively solve problems and may need a restraint for safety  Memory Memory assist level: Recognizes or recalls less than 25% of the time/requires cueing greater than 75% of the time    Pain Pain Assessment Pain Assessment: No/denies pain  Therapy/Group: Individual Therapy  Lajuana Patchell 02/28/2015, 3:52 PM

## 2015-02-28 NOTE — Progress Notes (Signed)
Occupational Therapy Note  Patient Details  Name: Juan [name] MRN: [id number] Date of Birth: 04/10/1991  Today's Date: 02/28/2015 OT Individual Time: 1400-1430 OT Individual Time Calculation (min): 30 min   Pt denied pain Individual Therapy  Pt resting in w/c with girlfriend present.  Focus on grasp and release of varied objects with emphasis on BUE AAROM >90 degrees at shoulders.  Pt is able to flex bilateral shoulders to approx 110 degrees when reaching for objects.  Pt incorporates increased trunk compensatory strategies to complete tasks.  Pt noted with increased ability to release objects with bilateral hands (R>L).    [name], Zula [facility] 02/28/2015, 3:06 PM

## 2015-02-28 NOTE — Progress Notes (Signed)
Patient can verbalize the urge to urinate or defecate however he has had 3 incontinent bowel episodes and 2 incontinent bladder episodes during the day shift. He has not needed to be in and out catheterized during this period. He can follow simple commands. His family is very involved with his care and due to their presence, he can stay in a regular bed.

## 2015-03-01 ENCOUNTER — Inpatient Hospital Stay (HOSPITAL_COMMUNITY): Payer: Self-pay

## 2015-03-01 ENCOUNTER — Inpatient Hospital Stay (HOSPITAL_COMMUNITY): Payer: 59 | Admitting: Speech Pathology

## 2015-03-01 MED ORDER — PRO-STAT SUGAR FREE PO LIQD
30.0000 mL | Freq: Every day | ORAL | Status: DC
  Administered 2015-03-02: 30 mL
  Filled 2015-03-01: qty 30

## 2015-03-01 MED ORDER — METHYLPHENIDATE HCL 5 MG PO TABS
15.0000 mg | ORAL_TABLET | Freq: Two times a day (BID) | ORAL | Status: DC
  Administered 2015-03-01 – 2015-03-20 (×37): 15 mg via ORAL
  Filled 2015-03-01 (×38): qty 3

## 2015-03-01 MED ORDER — QUETIAPINE FUMARATE 50 MG PO TABS
50.0000 mg | ORAL_TABLET | Freq: Every day | ORAL | Status: DC
  Administered 2015-03-01 – 2015-03-08 (×8): 50 mg via ORAL
  Filled 2015-03-01 (×8): qty 1

## 2015-03-01 NOTE — Progress Notes (Signed)
[facility]     PROGRESS NOTE    Subjective/Complaints:  Sleeping well. Not a "morning person" per family. Continues to verbalize more. ROS --limited due to cognitive status. Denies pain, says he's hungry  Objective: Vital Signs: Blood pressure 122/70, pulse 85, temperature 98.6 F (37 C), temperature source Oral, resp. rate 16, height  (1.676 m), weight 64.411 kg (142 lb), SpO2 100 %. No results found. No results for input(s): WBC, HGB, HCT, PLT in the last 72 hours. No results for input(s): NA, K, CL, GLUCOSE, BUN, CREATININE, CALCIUM in the last 72 hours.  Invalid input(s): CO CBG (last 3)  No results for input(s): GLUCAP in the last 72 hours.  Wt Readings from Last 3 Encounters:  03/01/15 64.411 kg (142 lb)  02/12/15 73.936 kg (163 lb)  07/31/12 63.504 kg (140 lb)    Physical Exam:  Constitutional: He appears well-developed and well-nourished.  Alert.  HENT:  Head: Normocephalic and atraumatic.  Right Ear: External ear normal.  Left Ear: External ear normal.  Eyes: Right eye exhibits no discharge. Left eye exhibits no discharge. No scleral icterus. EOMI.  Neck: Normal range of motion. Neck supple. No JVD present. No tracheal deviation present. No thyromegaly present.  Trach site closing with some persistent granulation tissue present Cardiovascular: Normal rate and regular rhythm.  Respiratory: Effort normal and breath sounds normal. No respiratory distress. He has no wheezes. i heard no rales GI: Soft. Bowel sounds are normal. He exhibits no distension. There is no tenderness. There is no rebound and no guarding.  PEG tube (foley) in place--skin looks clean/dry.    Musculoskeletal: He exhibits no edema.  Neurological: speaks, usually in a whisper, occasionally phonates partially.  Moves all 4's, favors the right side over the left. No resting tone. Still some primitive posturing which can be broken with verbal and tactile  cues, left more than right..  Skin:  Numerous surgical scars on both LE's. All wounds essentially healed in both legs.  Neuro: follows one step commands more quickly. Sitting posture and head position improving.  Psychiatric:  Less distracted, restless.  Assessment/Plan: 1. 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 Physiatrist is providing close team supervision and 24 hour management of active medical problems listed below. Physiatrist and rehab team continue to assess barriers to discharge/monitor patient progress toward functional and medical goals.  Function:  Bathing Bathing position   Position: Bed  Bathing parts   Body parts bathed by helper: Right arm, Left arm, Chest, Abdomen, Front perineal area, Right upper leg, Buttocks, Left upper leg, Right lower leg, Left lower leg, Back  Bathing assist Assist Level: 2 helpers      Upper Body Dressing/Undressing Upper body dressing   What is the patient wearing?: Pull over shirt/dress       Pull over shirt/dress - Perfomed by helper: Thread/unthread right sleeve, Thread/unthread left sleeve, Put head through opening, Pull shirt over trunk        Upper body assist        Lower Body Dressing/Undressing Lower body dressing   What is the patient wearing?: Underwear, Pants, Socks   Underwear - Performed by helper: Thread/unthread right underwear leg, Thread/unthread left underwear leg, Pull underwear up/down   Pants- Performed by helper: Thread/unthread right pants leg, Thread/unthread left pants leg, Pull pants up/down   Non-skid slipper socks- Performed by helper: Don/doff right sock,  Don/doff left sock   Socks - Performed by helper: Don/doff right sock, Don/doff left sock              Lower body assist Assist for lower body dressing: 2 Helpers      Toileting Toileting Toileting activity did not occur:  No continent bowel/bladder event   Toileting steps completed by helper: Adjust clothing prior to toileting, Performs perineal hygiene, Adjust clothing after toileting    Toileting assist Assist level: Two helpers   Transfers Chair/bed transfer   Chair/bed transfer method: Lateral scoot Chair/bed transfer assist level: 2 helpers Chair/bed transfer assistive device: Mechanical lift Mechanical lift: Maximove   Locomotion Ambulation Ambulation activity did not occur: Safety/medical concerns (BLE NWB)         Wheelchair Wheelchair activity did not occur: Safety/medical concerns (low level TBI) Type: Manual Max wheelchair distance: 100 ft Assist Level: Supervision or verbal cues  Cognition Comprehension Comprehension assist level: Understands basic 25 - 49% of the time/ requires cueing 50 - 75% of the time  Expression Expression assist level: Expresses basic 25 - 49% of the time/requires cueing 50 - 75% of the time. Uses single words/gestures.  Social Interaction Social Interaction assist level: Interacts appropriately 25 - 49% of time - Needs frequent redirection.  Problem Solving Problem solving assist level: Solves basic less than 25% of the time - needs direction nearly all the time or does not effectively solve problems and may need a restraint for safety  Memory Memory assist level: Recognizes or recalls less than 25% of the time/requires cueing greater than 75% of the time   Medical Problem List and Plan: 1. 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 2. DVT Prophylaxis/Anticoagulation: Subcutaneous Lovenox as well as IVC filter 01/12/2015---still indicated 3. Pain Management: have weaned off a lot of pain meds. Really only on prn hydrocodone (which was reduced) and tylenol.   -appears to be fairly comfortable.   4. Mood/behavior/cognition: increase ritalin to  to improve attention/initiation further.    -weaning propranolol and klonopin due to their neurosedating effects.  -sleep improving nightly   -continue seroquel -- decrease to      -continue sleep chart  -family at bedside for safety reasons---otherwise this patient would require some sort of restraintg 5. Neuropsych: This patient is not capable of making decisions on his own behalf. 6. Skin/Wound Care: Routine skin checks continue 7. Fluids/Electrolytes/Nutrition: continue to monitor labs serially--will check again once off all TF.  8. Uncontrollable rhythmic movements/spasticity. Improved. Likely related to distractibility  - Continue Keppra and Vimpat for now--change to pill/oral form. Taper slightly soon?  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 Dysphagia:  -Gastrostomy tube 01/18/2015   -off bolus feeds now, intake improving  -continue protein supp, between meal supps however and low dose water boluses  -continue foley cath for now in place of g-tube which was accidentally pulled out  -all meds changed to oral form   11. Large tension pneumothorax on the right with multiple rib fractures/pulmonary  -?LLL pneumonia---CXR improved, clinically improved also.  -aspiration precautions  -decannulated without issue, stoma closing---may need silver nitrate to hypergranulation area 12. Multiple orthopedic fractures, L1-2 transverse process fractures,/left femur fracture with IM nailing, left and right open tibia fibular fractures, transverse acetabulum fracture status post percutaneous pinning,CM pelvic ring status post ORIF, pubic symphysis and  trans-sacral screw forL S I diastasis and right sacralalo fracture. Nonweightbearing bilateral lower extremities 8 weeks until 03/17/2015 13. Urine retention/neurogenic bladder: flomax trial, urecholine  trial  -spontaneous voiding improved. Requiring less I/O caths  -timed voiding to continue to improve continence    LOS (Days) 16 A FACE TO FACE EVALUATION WAS PERFORMED  Latima Hamza T 03/01/2015 11:25 AM

## 2015-03-01 NOTE — Progress Notes (Signed)
Physical Therapy Session Note  Patient Details  Name: Juan [name] MRN: [id number] Date of Birth: 07/14/1990  Today's Date: 03/01/2015 PT Individual Time: [id number] PT Individual Time Calculation (min): 59 min   Short Term Goals: Week 2:  PT Short Term Goal 1 (Week 2): Patient will sustain attention to functional tasks x 60 sec with max multimodal cues.  PT Short Term Goal 2 (Week 2): Patient will initiate functional tasks 50% of available opportunities with max multimodal cues.  PT Short Term Goal 3 (Week 2): Patient will maintain sitting balance with assist of one person x 15 min.   Skilled Therapeutic Interventions/Progress Updates:    Patient in wheelchair following OT session.  Propelled wheelchair 6590' with manual hand over hand for initiation and max verbal cues supervision for straight path with max cues for sustaining attention to task, mod assist for turns.  Patient transferred via SBT w/c to mat +2 total assist cues and increased time for pt's participation, hand placement.  Mod/max at times to brief periods of supervision for sitting balance at edge of mat while participating in various reaching and placing activities, UE weight bearing on ball, and weighted ball reaching and giving/tossing for trunk strengthening.  Patient with fatigue throughout session and given two supine rest breaks while challenging memory about family members and meals.  Patient transferred back to wheelchair SBT +2 total assist and pt assisted back to room and into recline wheelchair via SBT with mom assist as well as PT for positioning LE's and for comfort.  Left with mother and grand mother in room with quick release belt in place.  Therapy Documentation Precautions:  Precautions Precautions: Fall Precaution Comments: BLE NWB; trach, peg Required Braces or Orthoses: Other Brace/Splint Other Brace/Splint: B resting hand splints; B PRAFO Restrictions Weight Bearing Restrictions: Yes RLE Weight Bearing:  Non weight bearing LLE Weight Bearing: Non weight bearing Pain: Pain Assessment Pain Assessment: No/denies pain  See Function Navigator for Current Functional Status.   Therapy/Group: Individual Therapy  Rudi CocoWYNN,CYNDI  Cyndi CombesWynn, [place] CarolinaPT [phone] 03/01/2015  03/01/2015, 1:08 PM

## 2015-03-01 NOTE — Progress Notes (Signed)
Social Work Patient ID: Juan [name], male   DOB: 12/30/1990, 24 y.o.   MRN: [id number]   Have reviewed team conference with pt's mother and discussed feedback from insurance that they are closely monitoring gains and are beginning to state that they feel pt may not meet CIR eligibility by next week.  (they have approved him through Monday and want an update on that day).  Mother very upset with the possibility that his coverage might end but have stressed to her that team is making a concerted effort to document all of pt's gains to make argument for him to stay through to originally targeted d/c date of 11/1.  I have also left a lengthy VM for insurance reviewer to drive points of CIR benefit that may not have been captured in written documentation.  Will keep family and tx team posted on coverage.    Juan Duquette, LCSW

## 2015-03-01 NOTE — Progress Notes (Signed)
Occupational Therapy Session Note  Patient Details  Name: Juan [name] MRN: [id number] Date of Birth: 01/14/1991  Today's Date: 03/01/2015 OT Individual Time: 0900-0930 OT Individual Time Calculation (min): 30 min  and  Today's Date: 03/01/2015 OT Co-Treatment Time: 0830-0900 OT Co-Treatment Time Calculation (min): 30 min (Co-Treatment with SLP-total time 0800-0900)    Short Term Goals: Week 2:  OT Short Term Goal 1 (Week 2): Pt will sit EOB with min A for 15 min to participate in BADLs OT Short Term Goal 2 (Week 2): Pt will wash face with min A and min verbal cues to initiate task OT Short Term Goal 3 (Week 2): Pt will perform UB dressing tasks with max A OT Short Term Goal 4 (Week 2): Pt will exhibit sustained attention for 1 min with mod verbal cues  Skilled Therapeutic Interventions/Progress Updates:    Initial portion of session focus on bed mobility, dressing tasks seated EOB, and grooming tasks seated in w/c at sink with emphasis on task initiation, sequencing, attention to task, and increased BUE use for functional tasks. Co-treatment with SLP. Pt continues to require tot A + 2 for dynamic sitting balance to don pullover shirt.  Pt initiated threading BUE into shirt sleeves after placed over head.  Pt followed one step commands approx 90% of time for dressing tasks.  Pt continues to incorporate trunk compensatory movements when using BUE for tasks.  Seated at sink pt was able to brush teeth after toothbrush placed in R hand and toothpaste applied.  Pt transitioned to day room and engaged in in game of UNO.  Pt required mod verbal cues for task initiation and questioning cues to make correct play during game.  Pt attended to task approx 30 seconds while playing game and required verbal cue to redirect.  Pt transitioned to dynamic sitting balance tasks on EOM reaching for large ball, catching large ball, and tossing large ball to therapist.  Pt noted with increased BUE AROM (approx 120  degrees) when reaching for ball.  Pt was also able to perform a chest pass with ball.  Pt noted with increased ability to grasp and release smaller objects without physical release.  Therapy Documentation Precautions:  Precautions Precautions: Fall, Other (comment) Precaution Comments: BLE NWB; trach, peg Required Braces or Orthoses: Other Brace/Splint Other Brace/Splint: B resting hand splints; B PRAFO Restrictions Weight Bearing Restrictions: Yes RLE Weight Bearing: Non weight bearing LLE Weight Bearing: Non weight bearing   Pain:  Pt stated "ouch" when raising RLE to position sliding board; repositioned ADL: ADL ADL Comments: see FIM  See Function Navigator for Current Functional Status.   Therapy/Group: Individual Therapy  Rich BraveLanier, Joscelyn Hardrick Chappell 03/01/2015, 12:10 PM

## 2015-03-01 NOTE — Progress Notes (Signed)
Speech Language Pathology Daily Session Notes  Patient Details  Name: Juan [name] XXXHunt MRN: [id number] Date of Birth: 09/09/1990  Today'[name] Date: 03/01/2015  Session 1: SLP Co-Treatment Time: 0800 (Co Tx with OT (TL): 0800-0900)-0830 SLP Co-Treatment Time Calculation (min): 30 min   Session 2: SLP Individual Treatment Time: [id number] SLP Individual Treatment Time Calculation (min): 25 min   Session 3:SLP Individual Treatment Time: 1500-1535 SLP Individual Treatment Time Calculation (min): 35 min   Short Term Goals: Week 3: SLP Short Term Goal 1 (Week 3): Patient will sustain attention to a task for 5 minutes with Max A multimodal cues.  SLP Short Term Goal 2 (Week 3): Patient will initiate functional tasks in 90% of opportunities with Mod A multimodal cues.  SLP Short Term Goal 3 (Week 3): Patient will vocalize with Max A multimodal cues in 25% of opportunities  SLP Short Term Goal 4 (Week 3): Patient will express basic wants/needs in 90% of opportunities with Mod A multimodal cues.  SLP Short Term Goal 5 (Week 3): Patient will consume trials of Dys. 2 textures without overt [name]/[name] of aspiration over 2 consecutive sesions with Max A multimodal cues.  SLP Short Term Goal 6 (Week 3): Patient will consume current diet with minimal overt [name]/[name] of aspiration with Mod A verbal cues for use of swallowing compensatory strategies.   Skilled Therapeutic Interventions:  Session 1: Skilled co-treatment session with OT focused on cognitive goals.  Patient participated in self-care tasks while sitting EOB with +2 assist and sitting upright in the wheelchair at the sink. Patient initiated tasks with BUE with extra time and Min-Mod A multimodal cues and followed ~90% of commands. Patient also participated in a card task with focus on use of BUE, problem solving and attention. Pt required mod verbal and questioning cues for task initiation and problem solving in regards to self-monitoring and correct.  Pt also  attended to task ~30 seconds while playing game and required verbal cues for redirection. Patient left with OT to continue treatment session. Continue with current plan of care.    Session 2: Skilled treatment session focused on dysphagia goals. SLP facilitated session by providing set-up assist with lunch meal of Dys. 1 textures with thin liquids via straw. Patient was able to scoop food onto his utensil but required Mod-Max A multimodal cues to bring his RUE to his mouth. Patient also managed his cup with Min A verbal cues. Patient reported the food was "nasty" but continue to self-feed. Patient required Mod-Max A verbal and tactile cues for use of a slow rate of self-feeding and small bites/sips. Patient consumed meal with cough X 1, suspect due to mixed consistencies. Patient left with mother to continue PO intake of his lunch meal. Continue with current plan of care.   Session 3: Skilled treatment session focused on cognitive and speech goals. Patient requested to play a familiar card game and required total A for problem solving with task and Max A for attention to task for ~30 seconds. SLP also provided total A multimodal cues to elicit use of his upper airway to achieve phonation, however, patient unable to phonate despite multiple attempts. Patient left upright in wheelchair with family. Continue with current plan of care.    Function:  Eating Eating   Modified Consistency Diet: Yes Eating Assist Level: Help managing cup/glass;Supervision or verbal cues;Helper brings food to mouth       Helper Brings Food to Mouth: Occasionally   Cognition Comprehension Comprehension assist level: Understands  basic 25 - 49% of the time/ requires cueing 50 - 75% of the time  Expression   Expression assist level: Expresses basic 25 - 49% of the time/requires cueing 50 - 75% of the time. Uses single words/gestures.  Social Interaction Social Interaction assist level: Interacts appropriately 25 - 49% of  time - Needs frequent redirection.  Problem Solving Problem solving assist level: Solves basic less than 25% of the time - needs direction nearly all the time or does not effectively solve problems and may need a restraint for safety  Memory Memory assist level: Recognizes or recalls less than 25% of the time/requires cueing greater than 75% of the time    Pain Pain Assessment Pain Assessment: No/denies pain  Therapy/Group: Individual Therapy  Claudean Leavelle 03/01/2015, 4:04 PM

## 2015-03-01 NOTE — Plan of Care (Signed)
Problem: RH BLADDER ELIMINATION Goal: RH STG MANAGE BLADDER WITH ASSISTANCE STG Manage Bladder With maxx Assistance  Outcome: Progressing Able to void- post void residual- low scans noted during the day(not requiring in and out cath).
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
## 2015-03-02 ENCOUNTER — Inpatient Hospital Stay (HOSPITAL_COMMUNITY): Payer: 59 | Admitting: *Deleted

## 2015-03-02 ENCOUNTER — Inpatient Hospital Stay (HOSPITAL_COMMUNITY): Payer: 59

## 2015-03-02 ENCOUNTER — Inpatient Hospital Stay (HOSPITAL_COMMUNITY): Payer: 59 | Admitting: Speech Pathology

## 2015-03-02 ENCOUNTER — Inpatient Hospital Stay (HOSPITAL_COMMUNITY): Payer: 59 | Admitting: Physical Therapy

## 2015-03-02 LAB — BASIC METABOLIC PANEL
Anion gap: 11 (ref 5–15)
BUN: 12 mg/dL (ref 6–20)
CO2: 22 mmol/L (ref 22–32)
Calcium: 9.9 mg/dL (ref 8.9–10.3)
Chloride: 105 mmol/L (ref 101–111)
Creatinine, Ser: 0.56 mg/dL — ABNORMAL LOW (ref 0.61–1.24)
GFR calc Af Amer: >60 mL/min (ref >60)
GFR calc non Af Amer: >60 mL/min (ref >60)
Glucose, Bld: 95 mg/dL (ref 65–99)
Potassium: 5.2 mmol/L — ABNORMAL HIGH (ref 3.5–5.1)
Sodium: 138 mmol/L (ref 135–145)

## 2015-03-02 LAB — CBC
HCT: 34.2 % — ABNORMAL LOW (ref 39.0–52.0)
Hemoglobin: 12.4 g/dL — ABNORMAL LOW (ref 13.0–17.0)
MCH: 30.5 pg (ref 26.0–34.0)
MCHC: 36.3 g/dL — ABNORMAL HIGH (ref 30.0–36.0)
MCV: 84 fL (ref 78.0–100.0)
Platelets: 233 10*3/uL (ref 150–400)
RBC: 4.07 MIL/uL — ABNORMAL LOW (ref 4.22–5.81)
RDW: 13.6 % (ref 11.5–15.5)
WBC: 6 10*3/uL (ref 4.0–10.5)

## 2015-03-02 MED ORDER — FREE WATER
50.0000 mL | Freq: Every day | Status: DC
  Administered 2015-03-03 – 2015-03-07 (×5): 50 mL

## 2015-03-02 NOTE — Progress Notes (Signed)
Occupational Therapy Note  Patient Details  Name: Juan PilotJacob S XXXHunt MRN: [id number] Date of Birth: 01/01/1991  Today's Date: 03/02/2015 OT Individual Time: 1300-1330 OT Individual Time Calculation (min): 30 min   Pt denied pain Individual Therapy  Pt engaged in Dyn[employer]ision activities at w/c level.  Pt challenged with locating red light (rings 1 & 2) and initiating and touching light with BUE.  Pt required verbal cues to initiate task approx 50% of time.  Pt noted with increased BUE AROM with decreased trunk compensation.  Pt exhibited improved reaction time with repetition.  Pt required mod verbal cues to attend to task.     [name], Tylicia Sherman [facility]ell 03/02/2015, 2:24 PM

## 2015-03-02 NOTE — Progress Notes (Signed)
Physical Therapy Weekly Progress Note  Patient Details  Name: Juan [name] MRN: [id number] Date of Birth: 1990-07-16  Beginning of progress report period: February 21, 2015 End of progress report period: March 02, 2015  Today's Date: 03/02/2015 PT Individual Time: 0900-1000 PT Individual Time Calculation (min): 60 min   Patient has met 3 of 3 short term goals.  Patient has made steady progress since last reporting period with functional improvement in dynamic sitting balance, sustained attention, initiation, functional use of bilateral upper extremities, and following one-step commands. Patient is able to maintain sitting balance with min-mod A with initiation of balance strategy training this date due to increased ability to utilize both hands to support trunk and decreased writhing, restless movement. With verbal/visual cues, patient is able to correct posture to midline for brief periods of time with supervision. Have progressed from use of maxi sky mechanical lift to use of slide board transfer with assist of one person to complete transfer, +2 required to safely place board due to decreased sitting balance. Patient consistently follows one-step commands for functional mobility. He has been able to transition out of tilt in space wheelchair (dependent) to manual wheelchair due to improved sitting balance and postural control and can propel approx 100 ft using BUE with max multimodal cues and hand over hand assist to initiate propulsion as patient has difficulty locating wheelchair rims. Patient and family education ongoing.   Patient continues to demonstrate the following deficits: muscle weakness, muscle joint tightness, decreased endurance, impaired timing and sequencing, abnormal tone, unbalanced muscle activation, motor apraxia, ataxia, decreased coordination, decreased motor planning, decreased midline orientation, decreased initiation, decreased attention, decreased awareness, decreased  problem solving, decreased safety awareness, decreased memory, delayed processing, decreased sitting balance, decreased postural control, decreased balance strategies and difficulty maintaining precautions and therefore will continue to benefit from skilled PT intervention to enhance overall performance with activity tolerance, balance, postural control, ability to compensate for deficits, functional use of  right upper extremity and left upper extremity, attention, awareness, coordination and knowledge of precautions.  Patient progressing toward long term goals.  Continue plan of care.   PT Short Term Goals Week 2:  PT Short Term Goal 1 (Week 2): Patient will sustain attention to functional tasks x 60 sec with max multimodal cues.  PT Short Term Goal 1 - Progress (Week 2): Met PT Short Term Goal 2 (Week 2): Patient will initiate functional tasks 50% of available opportunities with max multimodal cues.  PT Short Term Goal 2 - Progress (Week 2): Met PT Short Term Goal 3 (Week 2): Patient will maintain sitting balance with assist of one person x 15 min.  PT Short Term Goal 3 - Progress (Week 2): Met Week 3:  PT Short Term Goal 1 (Week 3): Patient will propel wheelchair using BUE in straight path x 150 ft with supervision and max cues.  PT Short Term Goal 2 (Week 3): Patient will maintain sitting balance x 15 min with consistent min A.  PT Short Term Goal 3 (Week 3): Patient will sustain attention to functional tasks x 5 min with max cues.  PT Short Term Goal 4 (Week 3): Patient will perform bed mobility with consistent mod A.   Skilled Therapeutic Interventions/Progress Updates:   Patient in wheelchair, handoff from OT. Propelled wheelchair x 50 ft with hand over hand assist to initiate task due to patient continuously attempting to propel using brakes or arm rests despite max multimodal cues. Patient required hand over hand assist  to perform effective turns. Performed slide board transfers wheelchair  <> mat table and wheelchair > bed with +2 to safely place board and max to +2 for uphill transfer with focus on patient participation and functional use of BUE with hand over hand assist for hand placement. Sitting edge of mat, performed static > dynamic sitting balance x 45 min with min-mod A overall and brief periods of supervision up to 30 sec at a time while engaging in functional reach/throw tasks with tennis ball with dual task of naming sports teams with max multimodal cues, use of bilateral upper extremities to maintain balance and midline positioning, instructing patient in use of bilateral upper extremities for balance reaction strategies, core activation for initiation of reaching tasks and balance/midline tasks. Patient instructed in color block task with focus on following one step commands with max multimodal cues and functional use of one upper extremity to place block on matching square, patient required max A multimodal cues to successfully complete task due to motor and verbal perseveration on previously completed step. Patient given 2 prolonged supported sitting rest breaks on mat due to fatigue. Patient requested to return to bed at end of session to rest, left supine with bed alarm on, 4 rails up, and girlfriend present.    Therapy Documentation Precautions:  Precautions Precautions: Fall Precaution Comments: BLE NWB; trach, peg Required Braces or Orthoses: Other Brace/Splint Other Brace/Splint: B resting hand splints; B PRAFO Restrictions Weight Bearing Restrictions: Yes RLE Weight Bearing: Non weight bearing LLE Weight Bearing: Non weight bearing Pain: Pain Assessment Pain Assessment: Faces Faces Pain Scale: Hurts a little bit Pain Type: Acute pain;Surgical pain Pain Location: Leg Pain Orientation: Right;Left Pain Descriptors / Indicators: Discomfort;Grimacing Pain Onset: With Activity Pain Intervention(s): Repositioned;Rest  See Function Navigator for Current  Functional Status.  Therapy/Group: Individual Therapy  Laretta Alstrom 03/02/2015, 12:47 PM

## 2015-03-02 NOTE — Progress Notes (Signed)
Speech Language Pathology Daily Session Note  Patient Details  Name: Juan [name] MRN: [id number] Date of Birth: August 23, 1990  Today's Date: 03/02/2015 SLP Individual Time: 8127-5170 SLP Individual Time Calculation (min): 68 min  Short Term Goals: Week 1: SLP Short Term Goal 1 (Week 1): Patient will tolerate PMSV for ~30 minutes without s/s of distress and all vitals remaining WFL. SLP Short Term Goal 1 - Progress (Week 1): Discontinued (comment) SLP Short Term Goal 2 (Week 1): Patient will respond to yes/no questions while utilizing gestures with Max A multimodal cues in 20% of opportunities. SLP Short Term Goal 2 - Progress (Week 1): Met SLP Short Term Goal 3 (Week 1): Patient will follow basic 1 step directions in 25% of opportunities with Max A multimodal cues.  SLP Short Term Goal 3 - Progress (Week 1): Met SLP Short Term Goal 4 (Week 1): Patient will focus attention to a task for 30 seconds with Max A multimodal cues.  SLP Short Term Goal 4 - Progress (Week 1): Met SLP Short Term Goal 5 (Week 1): Patient will consume trials of ice chips without overt s/s of aspiration with Max A multimodal cues over 3 sessions.  SLP Short Term Goal 5 - Progress (Week 1): Met  Skilled Therapeutic Interventions:  Pt was seen for skilled ST targeting goals for dysphagia, cognition, and ongoing family education.  Upon arrival, pt was reclined in bed, awake, alert, and agreeable to participate in [place].  Pt was transferred to wheelchair with assistance from RN and with Maximove lift to maximize attention, alertness, and swallowing safety during structured tasks.  SLP attempted to engage pt in a basic card game to address sustained attention and initiation.  Pt sustained attention to task for 1-2 minute intervals with overall min-mod assist verbal cues and also benefited from min verbal cues  to initiate turn taking.   Pt also consumed presentations of his currently prescribed diet with max faded to min assist  verbal cues for use of rate and portion control.  Pt's girlfriend was present during today's therapy session and was provided with skilled education regarding rationale behind pt's currently recommended diet and swallowing precautions.  Pt's girlfriend was observed providing supervision during pt's lunch meal and she demonstrated appropriate cuing techniques (verbal and tactile cues, tray set up) to maximize pt's functional independence for self feeding while ensuring his use of swallowing precautions.  Pt was left in room upright in wheelchair with girlfriend present and providing supervision while he consumed lunch.  Continue per current plan of care.   Function:  Eating Eating   Modified Consistency Diet: Yes Eating Assist Level: Set up assist for;Supervision or verbal cues;Helper checks for pocketed food;Helper scoops food on utensil;Help with picking up utensils;Help managing cup/glass   Eating Set Up Assist For: Opening containers Helper Scoops Food on Utensil: Occasionally     Cognition Comprehension Comprehension assist level: Understands basic 25 - 49% of the time/ requires cueing 50 - 75% of the time  Expression   Expression assist level: Expresses basic 25 - 49% of the time/requires cueing 50 - 75% of the time. Uses single words/gestures.  Social Interaction Social Interaction assist level: Interacts appropriately 25 - 49% of time - Needs frequent redirection.  Problem Solving Problem solving assist level: Solves basic 25 - 49% of the time - needs direction more than half the time to initiate, plan or complete simple activities  Memory Memory assist level: Recognizes or recalls less than 25% of the time/requires  cueing greater than 75% of the time    Pain Pain Assessment Pain Assessment: No/denies pain   Therapy/Group: Individual Therapy  Reyan Helle, Selinda Orion 03/02/2015, 3:58 PM

## 2015-03-02 NOTE — Progress Notes (Signed)
Occupational Therapy Weekly Progress Note  Patient Details  Name: Juan [name] MRN: [id number] Date of Birth: Mar 15, 1991  Beginning of progress report period: February 23, 2015 End of progress report period: March 02, 2015  Patient has met 4 of 4 short term goals.  Pt has exhibited steady progress during the past week with gains noted in sustained attention, dynamic sitting balance EOB, task initiation, increased BUE use during functional tasks, and orientation.  Pt follows one step commands with 90% accuracy.  Pt continues to require min/mod verbal cues to initiate and sequence bathing and dressing tasks.  Pt exhibits increased BUE AROM (approx 150 degrees) during therapeutic activities.  Pt is currently assisting with self feeding tasks and is able to brush teeth without assistance after setup.  Patient continues to demonstrate the following deficits: decreased trunk control and sitting balance, NWB BLE, decreased cognition of sustained attention, limited ability to follow 2 step directions and therefore will continue to benefit from skilled OT intervention to enhance overall performance with BADL.  Patient progressing toward long term goals..  Continue plan of care.  OT Short Term Goals Week 2:  OT Short Term Goal 1 (Week 2): Pt will sit EOB with min A for 15 min to participate in BADLs OT Short Term Goal 1 - Progress (Week 2): Met OT Short Term Goal 2 (Week 2): Pt will wash face with min A and min verbal cues to initiate task OT Short Term Goal 2 - Progress (Week 2): Met OT Short Term Goal 3 (Week 2): Pt will perform UB dressing tasks with max A OT Short Term Goal 3 - Progress (Week 2): Met OT Short Term Goal 4 (Week 2): Pt will exhibit sustained attention for 1 min with mod verbal cues OT Short Term Goal 4 - Progress (Week 2): Met Week 3:  OT Short Term Goal 1 (Week 3): Pt will maintain dynamic sitting balance EOB during dressing tasks with min A for 15 mins. OT Short Term Goal 2 (Week  3): Pt will perform UB dressing tasks with mod A OT Short Term Goal 3 (Week 3): Pt will bathe UB with min A while seated in supported position with min verbal cues for initiation OT Short Term Goal 4 (Week 3): Pt will exhibit sustained attention during ADLs for 3 min with min verbal cues     Therapy Documentation Precautions:  Precautions Precautions: Fall Precaution Comments: BLE NWB; trach, peg Required Braces or Orthoses: Other Brace/Splint Other Brace/Splint: B resting hand splints; B PRAFO Restrictions Weight Bearing Restrictions: Yes RLE Weight Bearing: Non weight bearing LLE Weight Bearing: Non weight bearing  See Function Navigator for Current Functional Status.  Leotis Shames [facility] 03/02/2015, 2:50 PM

## 2015-03-02 NOTE — Progress Notes (Signed)
Occupational Therapy Session Note  Patient Details  Name: Juan [name] MRN: [id number] Date of Birth: 02/03/1991  Today's Date: 03/02/2015 OT Individual Time: 0800-0900 OT Individual Time Calculation (min): 60 min    Short Term Goals: Week 2:  OT Short Term Goal 1 (Week 2): Pt will sit EOB with min A for 15 min to participate in BADLs OT Short Term Goal 2 (Week 2): Pt will wash face with min A and min verbal cues to initiate task OT Short Term Goal 3 (Week 2): Pt will perform UB dressing tasks with max A OT Short Term Goal 4 (Week 2): Pt will exhibit sustained attention for 1 min with mod verbal cues  Skilled Therapeutic Interventions/Progress Updates:    Pt resting in bed with girlfriend present.  Pt indicated he was ready to get OOB.  Pt required max multimodal cues to safely initiate sitting EOB and mod A for supine->sit EOB.  Pt required max A for dynamic sitting balance this morning with increased trunk movements (rotation and extension). Pt threaded RUE into shirt sleeve this morning after shirt placed over his head.  Pt also initiated threading LUE into shirt sleeve but required min A to complete task.  Pt lifted BLE when requested to facilitate donning of shorts.  Pt required max A for lateral leans to facilitate pulling up shorts.  Pt required tot A + 2 (for safety) for sliding board transfer to w/c.  Pt brushed teeth with setup assistance only this morning.  Pt also washed face without assistance when presented with wash cloth.  Pt propelled w/c to day room with min A and max verbal cues to attend to task.  Pt is easily distracted by activity in hallways and therapy gym.  Pt continues to exhibit increased BUE functional use to retrieve, grasp, and toss large ball.  Pt noted with increased BUE shoulder flexion (approx 150 degrees) this morning when engaged in functional task.  Pt exhibits sustained attention for approx 1 min with mod verbal cues to redirect. Focus on unsupported sitting  balance, functional transfers, increased use of BUE during functional tasks, task initiation, sequencing, and attention to task.  Therapy Documentation Precautions:  Precautions Precautions: Fall Precaution Comments: BLE NWB; trach, peg Required Braces or Orthoses: Other Brace/Splint Other Brace/Splint: B resting hand splints; B PRAFO Restrictions Weight Bearing Restrictions: Yes RLE Weight Bearing: Non weight bearing LLE Weight Bearing: Non weight bearing Pain: Pain Assessment Pain Assessment: Faces Faces Pain Scale: Hurts little more Pain Location: Leg Pain Orientation: Left Pain Descriptors / Indicators: Grimacing Pain Onset: With Activity Pain Intervention(s): RN made aware;Repositioned   See Function Navigator for Current Functional Status.   Therapy/Group: Individual Therapy  Rich BraveLanier, Myrella Fahs Chappell 03/02/2015, 9:05 AM

## 2015-03-02 NOTE — Progress Notes (Addendum)
[facility]IC[facility] & REHABILITATION     PROGRESS NOTE    Subjective/Complaints:  Had a good night. Denies pain. Drinking thin liquids!! Cut his hair!!. ROS --limited due to cognitive status. Denies pain, says he's hungry  Objective: Vital Signs: Blood pressure 102/71, pulse 104, temperature 99 F (37.2 C), temperature source Oral, resp. rate 18, height  (1.676 m), weight 66.18 kg (145 lb 14.4 oz), SpO2 98 %. No results found.  Recent Labs  03/02/15 0550  WBC 6.0  HGB 12.4*  HCT 34.2*  PLT 233    Recent Labs  03/02/15 0550  NA 138  K 5.2*  CL 105  GLUCOSE 95  BUN 12  CREATININE 0.56*  CALCIUM 9.9   CBG (last 3)  No results for input(s): GLUCAP in the last 72 hours.  Wt Readings from Last 3 Encounters:  03/02/15 66.18 kg (145 lb 14.4 oz)  02/12/15 73.936 kg (163 lb)  07/31/12 63.504 kg (140 lb)    Physical Exam:  Constitutional: He appears well-developed and well-nourished.  Alert.  HENT:  Head: Normocephalic and atraumatic.  Right Ear: External ear normal.  Left Ear: External ear normal.  Eyes: Right eye exhibits no discharge. Left eye exhibits no discharge. No scleral icterus. EOMI.  Neck: Normal range of motion. Neck supple. No JVD present. No tracheal deviation present. No thyromegaly present.  Trach site closing with some persistent granulation tissue present Cardiovascular: Normal rate and regular rhythm.  Respiratory: Effort normal and breath sounds normal. No respiratory distress. He has no wheezes. i heard no rales GI: Soft. Bowel sounds are normal. He exhibits no distension. There is no tenderness. There is no rebound and no guarding.  PEG tube (foley) in place--skin looks clean/dry.    Musculoskeletal: He exhibits no edema.  Neurological: speaks, usually in a whisper, occasionally phonates partially.  Moves all 4's, favors the right side over the left. No resting tone. Still some primitive posturing which can be broken with  verbal and tactile cues, left more than right..  Skin:  Numerous surgical scars on both LE's. All wounds essentially healed in both legs.  Neuro: follows one step commands more quickly. Sitting posture and head position improving.  Psychiatric:  Less distracted, restless.  Assessment/Plan: 1. 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 Physiatrist is providing close team supervision and 24 hour management of active medical problems listed below. Physiatrist and rehab team continue to assess barriers to discharge/monitor patient progress toward functional and medical goals.  Function:  Bathing Bathing position   Position: Bed  Bathing parts   Body parts bathed by helper: Right arm, Left arm, Chest, Abdomen, Front perineal area, Right upper leg, Buttocks, Left upper leg, Right lower leg, Left lower leg, Back  Bathing assist Assist Level: 2 helpers      Upper Body Dressing/Undressing Upper body dressing   What is the patient wearing?: Pull over shirt/dress     Pull over shirt/dress - Perfomed by patient: Thread/unthread right sleeve Pull over shirt/dress - Perfomed by helper: Thread/unthread left sleeve, Put head through opening, Pull shirt over trunk        Upper body assist        Lower Body Dressing/Undressing Lower body dressing   What is the patient wearing?: Underwear, Pants, Socks   Underwear - Performed by helper: Thread/unthread right underwear leg, Thread/unthread left underwear leg, Pull underwear up/down   Pants-  Performed by helper: Thread/unthread right pants leg, Thread/unthread left pants leg, Pull pants up/down   Non-skid slipper socks- Performed by helper: Don/doff right sock, Don/doff left sock   Socks - Performed by helper: Don/doff right sock, Don/doff left sock              Lower body assist Assist for lower body dressing: 2 Helpers       Toileting Toileting Toileting activity did not occur: No continent bowel/bladder event   Toileting steps completed by helper: Adjust clothing prior to toileting, Performs perineal hygiene, Adjust clothing after toileting    Toileting assist Assist level: Two helpers   Transfers Chair/bed transfer   Chair/bed transfer method: Lateral scoot Chair/bed transfer assist level: 2 helpers Chair/bed transfer assistive device: Armrests, Sliding board Mechanical lift: Maximove   Locomotion Ambulation Ambulation activity did not occur: Safety/medical concerns (BLE NWB)         Wheelchair Wheelchair activity did not occur: Safety/medical concerns (low level TBI) Type: Manual Max wheelchair distance: 90 Assist Level: Touching or steadying assistance (Pt > 75%)  Cognition Comprehension Comprehension assist level: Understands basic 25 - 49% of the time/ requires cueing 50 - 75% of the time  Expression Expression assist level: Expresses basic 25 - 49% of the time/requires cueing 50 - 75% of the time. Uses single words/gestures.  Social Interaction Social Interaction assist level: Interacts appropriately 25 - 49% of time - Needs frequent redirection.  Problem Solving Problem solving assist level: Solves basic less than 25% of the time - needs direction nearly all the time or does not effectively solve problems and may need a restraint for safety  Memory Memory assist level: Recognizes or recalls less than 25% of the time/requires cueing greater than 75% of the time   Medical Problem List and Plan: 1. 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 2. DVT Prophylaxis/Anticoagulation: Subcutaneous Lovenox as well as IVC filter 01/12/2015---still indicated 3. Pain Management: have weaned off a lot of pain meds. Really only on  prn hydrocodone (which was reduced) and tylenol.   -appears to be fairly comfortable.  4. Mood/behavior/cognition: increased ritalin to 15mg  to improve attention/initiation further.    -weaning propranolol and klonopin due to their neurosedating effects.  -sleep improving nightly   -continue seroquel 75mg -- decrease to 50mg      -continue sleep chart  -family at bedside for safety reasons---otherwise this patient would require some sort of restraintg 5. Neuropsych: This patient is not capable of making decisions on his own behalf. 6. Skin/Wound Care: Routine skin checks continue 7. Fluids/Electrolytes/Nutrition: I personally reviewed the patient's labs today.   -K+ increased due to hemolysis. The remainders of labs normal 8. Uncontrollable rhythmic movements/spasticity. Improved. Likely related to distractibility  - Continue Keppra and Vimpat for now--change to pill/oral form. Taper slightly soon?  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 Dysphagia: now on D1/thins  -Gastrostomy tube 01/18/2015   -off bolus feeds now, intake much improved  -dc supps and h20 boluses  -continue foley cath for now in place of g-tube which was accidentally pulled out--can remove next week  -all meds changed to oral form   11. Large tension pneumothorax on the right with multiple rib fractures/pulmonary  -?LLL pneumonia---CXR resolved.  -aspiration precautions  -trach stoma closing---may need silver nitrate to hypergranulation area 12. Multiple orthopedic  fractures, L1-2 transverse process fractures,/left femur fracture with IM nailing, left and right open tibia fibular fractures, transverse acetabulum fracture status post percutaneous pinning,CM pelvic ring status post ORIF, pubic symphysis and trans-sacral screw forL S I diastasis and right sacralalo fracture. Nonweightbearing bilateral lower extremities 8 weeks until 03/17/2015 13.  Urine retention/neurogenic bladder: flomax trial, urecholine trial  -spontaneous voiding improved. pvr's slowly decreasing (200-600cc)  -timed voiding to continue to improve continence    LOS (Days) 17 A FACE TO FACE EVALUATION WAS PERFORMED  Alfonsa Vaile T 03/02/2015 11:03 AM

## 2015-03-03 ENCOUNTER — Inpatient Hospital Stay (HOSPITAL_COMMUNITY): Payer: 59 | Admitting: Speech Pathology

## 2015-03-03 MED ORDER — TRAMADOL 5 MG/ML ORAL SUSPENSION: 50.0000 mg | Freq: Four times a day (QID) | ORAL | Status: DC

## 2015-03-03 MED ORDER — TRAMADOL HCL 50 MG PO TABS
50.0000 mg | ORAL_TABLET | Freq: Four times a day (QID) | ORAL | Status: DC
  Administered 2015-03-03 – 2015-03-04 (×3): 50 mg
  Filled 2015-03-03 (×4): qty 1

## 2015-03-03 NOTE — Plan of Care (Signed)
Problem: RH BLADDER ELIMINATION Goal: RH STG MANAGE BLADDER WITH ASSISTANCE STG Manage Bladder With moderate Assistance  Outcome: Not Progressing Requires i&o cath

## 2015-03-03 NOTE — Progress Notes (Signed)
Speech Language Pathology Daily Session Note  Patient Details  Name: Juan [name] MRN: [id number] Date of Birth: 12/19/1990  Today's Date: 03/03/2015 SLP Individual Time: 1330-1400 SLP Individual Time Calculation (min): 30 min  Short Term Goals: Week 3: SLP Short Term Goal 1 (Week 3): Patient will sustain attention to a task for 5 minutes with Max A multimodal cues.  SLP Short Term Goal 2 (Week 3): Patient will initiate functional tasks in 90% of opportunities with Mod A multimodal cues.  SLP Short Term Goal 3 (Week 3): Patient will vocalize with Max A multimodal cues in 25% of opportunities  SLP Short Term Goal 4 (Week 3): Patient will express basic wants/needs in 90% of opportunities with Mod A multimodal cues.  SLP Short Term Goal 5 (Week 3): Patient will consume trials of Dys. 2 textures without overt s/s of aspiration over 2 consecutive sesions with Max A multimodal cues.  SLP Short Term Goal 6 (Week 3): Patient will consume current diet with minimal overt s/s of aspiration with Mod A verbal cues for use of swallowing compensatory strategies.   Skilled Therapeutic Interventions: Skilled ST intervention provided with focus on dysphagia and cognitive goals. Pt seated upright in w/c, 90 degrees for PO intake. Slp administered trial of D2 textures with no difficulties observed. No overt s/s of aspiration noted with thin liquids via straw sips. Pt able to clear oral cavity with cued liquid wash. Family present and reports that pt fed himself today at lunch, consuming 100% of meal. Pt completed sorting tasks by organizing cards based on color. Pt was 30% accurate with identifying correct placement , given max multimodal cues. Perseverations noted while labeling colors. Hand over hand cuing required to increase purposful movements toward correct stack.    Function:  Eating Eating   Modified Consistency Diet: Yes Eating Assist Level: Supervision or verbal cues;Helper checks for pocketed  food;Help managing cup/glass   Eating Set Up Assist For: Opening containers Helper Scoops Food on Utensil: Occasionally Helper Brings Food to Mouth: Occasionally   Cognition Comprehension Comprehension assist level: Understands basic 50 - 74% of the time/ requires cueing 25 - 49% of the time  Expression   Expression assist level: Expresses basic 50 - 74% of the time/requires cueing 25 - 49% of the time. Needs to repeat parts of sentences.  Social Interaction Social Interaction assist level: Interacts appropriately 50 - 74% of the time - May be physically or verbally inappropriate.  Problem Solving Problem solving assist level: Solves basic less than 25% of the time - needs direction nearly all the time or does not effectively solve problems and may need a restraint for safety  Memory Memory assist level: Recognizes or recalls 25 - 49% of the time/requires cueing 50 - 75% of the time    Pain Pain Assessment Pain Assessment: No/denies pain  Therapy/Group: Individual Therapy  Linzi Ohlinger, Kara PacerLeah N 03/03/2015, 2:39 PM

## 2015-03-03 NOTE — Progress Notes (Signed)
[facility] MEDICINE & REHABILITATION     PROGRESS NOTE    Subjective/Complaints:  Mother and grandmother are in the room. Patient has complained of leg pain more over the last couple days. No falls or other trauma. ROS --limited due to cognitive status. leg pain, no foot pain  Objective: Vital Signs: Blood pressure 120/65, pulse 80, temperature 98.1 F (36.7 C), temperature source Oral, resp. rate 20, height  (1.676 m), weight 75.6 kg (166 lb 10.7 oz), SpO2 100 %. No results found.  Recent Labs  03/02/15 0550  WBC 6.0  HGB 12.4*  HCT 34.2*  PLT 233    Recent Labs  03/02/15 0550  NA 138  K 5.2*  CL 105  GLUCOSE 95  BUN 12  CREATININE 0.56*  CALCIUM 9.9   CBG (last 3)  No results for input(s): GLUCAP in the last 72 hours.  Wt Readings from Last 3 Encounters:  03/03/15 75.6 kg (166 lb 10.7 oz)  02/12/15 73.936 kg (163 lb)  07/31/12 63.504 kg (140 lb)    Physical Exam:  Constitutional: He appears well-developed and well-nourished.  Alert.  HENT:  Head: Normocephalic and atraumatic.  Right Ear: External ear normal.  Left Ear: External ear normal.  Eyes: Right eye exhibits no discharge. Left eye exhibits no discharge. No scleral icterus. EOMI.  Neck: Normal range of motion. Neck supple. No JVD present. No tracheal deviation present. No thyromegaly present.   Cardiovascular: Normal rate and regular rhythm.  Respiratory: Effort normal and breath sounds normal. No respiratory distress. He has no wheezes. no rales GI: Soft. Bowel sounds are normal. He exhibits no distension. There is no tenderness. There is no rebound and no guarding.  PEG tube (foley) in place--skin looks clean/dry.    Musculoskeletal: He exhibits no edema. No pain with range of motion of the knee or ankle Neurological: speaks, usually in a whisper, occasionally phonates partially.  Moves all 4's, favors the right side over the left. No resting tone. Still some primitive posturing  with flexion in the upper and lower limbs which can be broken with verbal and tactile cues, left more than right.. No hypersensitivity to touch in both lower extremities Skin:  Numerous surgical scars on both LE's. All wounds essentially healed in both legs.  Neuro: follows one step commands more quickly. Sitting posture and head position improving.  Psychiatric:  Less distracted, restless.  Assessment/Plan: 1. 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 Physiatrist is providing close team supervision and 24 hour management of active medical problems listed below. Physiatrist and rehab team continue to assess barriers to discharge/monitor patient progress toward functional and medical goals.  Function:  Bathing Bathing position   Position: Bed  Bathing parts   Body parts bathed by helper: Right arm, Left arm, Chest, Abdomen, Front perineal area, Right upper leg, Buttocks, Left upper leg, Right lower leg, Left lower leg, Back  Bathing assist Assist Level: 2 helpers      Upper Body Dressing/Undressing Upper body dressing   What is the patient wearing?: Pull over shirt/dress     Pull over shirt/dress - Perfomed by patient: Thread/unthread right sleeve Pull over shirt/dress - Perfomed by helper: Thread/unthread left sleeve, Put head through opening, Pull shirt over trunk        Upper body assist        Lower Body Dressing/Undressing Lower body dressing   What is  the patient wearing?: Underwear, Pants, Socks   Underwear - Performed by helper: Thread/unthread right underwear leg, Thread/unthread left underwear leg, Pull underwear up/down   Pants- Performed by helper: Thread/unthread right pants leg, Thread/unthread left pants leg, Pull pants up/down   Non-skid slipper socks- Performed by helper: Don/doff right sock, Don/doff left sock   Socks - Performed by helper:  Don/doff right sock, Don/doff left sock              Lower body assist Assist for lower body dressing: 2 Helpers      Financial trader activity did not occur: No continent bowel/bladder event   Toileting steps completed by helper: Adjust clothing prior to toileting, Performs perineal hygiene, Adjust clothing after toileting    Toileting assist Assist level: Two helpers   Transfers Chair/bed transfer   Chair/bed transfer method: Lateral scoot Chair/bed transfer assist level: 2 helpers Chair/bed transfer assistive device: Armrests, Sliding board Mechanical lift: Maximove   Locomotion Ambulation Ambulation activity did not occur: Safety/medical concerns (BLE NWB)         Wheelchair Wheelchair activity did not occur: Safety/medical concerns (low level TBI) Type: Manual Max wheelchair distance: 50 Assist Level: Touching or steadying assistance (Pt > 75%)  Cognition Comprehension Comprehension assist level: Understands basic 25 - 49% of the time/ requires cueing 50 - 75% of the time  Expression Expression assist level: Expresses basic 25 - 49% of the time/requires cueing 50 - 75% of the time. Uses single words/gestures.  Social Interaction Social Interaction assist level: Interacts appropriately 25 - 49% of time - Needs frequent redirection.  Problem Solving Problem solving assist level: Solves basic 25 - 49% of the time - needs direction more than half the time to initiate, plan or complete simple activities  Memory Memory assist level: Recognizes or recalls 25 - 49% of the time/requires cueing 50 - 75% of the time   Medical Problem List and Plan: 1. 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 2. DVT Prophylaxis/Anticoagulation: Subcutaneous Lovenox as well as IVC filter 01/12/2015---still  indicated 3. Pain Management: have weaned off a lot of pain meds. Really only on prn hydrocodone (which was reduced) and tylenol.   -increasing pain complaints question if this is related to weaning of fentanyl patch or perhaps increased awareness. Will schedule tramadol 50 mg every 6 hours monitor for sedation 4. Mood/behavior/cognition: increased ritalin to  to improve attention/initiation further.    -weaning propranolol and klonopin due to their neurosedating effects.  -sleep improving nightly   -continue seroquel -- decrease to      -continue sleep chart  -family at bedside for safety reasons---otherwise this patient would require some sort of restraintg 5. Neuropsych: This patient is not capable of making decisions on his own behalf. 6. Skin/Wound Care: Routine skin checks continue 7. Fluids/Electrolytes/Nutrition: I personally reviewed the patient's labs today.   -K+ increased due to hemolysis. The remainders of labs normal 8. Uncontrollable rhythmic movements/spasticity. Improved. Likely related to distractibility, also on baclofen  - Continue Keppra and Vimpat for now--change to pill/oral form. Taper slightly soon?  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 Dysphagia: now on D1/thins  -Gastrostomy tube 01/18/2015   -off bolus feeds now, intake much improved  -dc supps and h20 boluses  -continue foley cath for now in place of g-tube which  was accidentally pulled out--can remove next week  -all meds changed to oral form   11. Large tension pneumothorax on the right with multiple rib fractures/pulmonary  -?LLL pneumonia---CXR resolved.  -aspiration precautions  -trach stoma closing---may need silver nitrate to hypergranulation area 12. Multiple orthopedic fractures, L1-2 transverse process fractures,/left femur fracture with IM nailing, left and right open tibia fibular fractures, transverse  acetabulum fracture status post percutaneous pinning,CM pelvic ring status post ORIF, pubic symphysis and trans-sacral screw forL S I diastasis and right sacralalo fracture. Nonweightbearing bilateral lower extremities 8 weeks until 03/17/2015 13. Urine retention/neurogenic bladder: flomax trial, urecholine trial  -spontaneous voiding improved. pvr's slowly decreasing, still requiring in and out caths, 550 mL today  -timed voiding to continue to improve continence    LOS (Days) 18 A FACE TO FACE EVALUATION WAS PERFORMED  Claudette LawsKIRSTEINS,ANDREW E 03/03/2015 11:32 AM

## 2015-03-04 ENCOUNTER — Inpatient Hospital Stay (HOSPITAL_COMMUNITY): Payer: 59 | Admitting: Physical Therapy

## 2015-03-04 DIAGNOSIS — S069X4A Unspecified intracranial injury with loss of consciousness of 6 hours to 24 hours, initial encounter: Secondary | ICD-10-CM

## 2015-03-04 DIAGNOSIS — S069X4S Unspecified intracranial injury with loss of consciousness of 6 hours to 24 hours, sequela: Secondary | ICD-10-CM

## 2015-03-04 MED ORDER — TRAMADOL HCL 50 MG PO TABS
100.0000 mg | ORAL_TABLET | Freq: Three times a day (TID) | ORAL | Status: DC
  Administered 2015-03-04 – 2015-03-18 (×42): 100 mg
  Filled 2015-03-04 (×42): qty 2

## 2015-03-04 MED ORDER — LEVETIRACETAM 100 MG/ML PO SOLN
1500.0000 mg | Freq: Two times a day (BID) | ORAL | Status: DC
  Administered 2015-03-04 – 2015-03-06 (×5): 1500 mg
  Filled 2015-03-04 (×4): qty 15

## 2015-03-04 MED ORDER — MORPHINE SULFATE ER 15 MG PO TBCR
15.0000 mg | EXTENDED_RELEASE_TABLET | Freq: Every day | ORAL | Status: DC
  Administered 2015-03-05 – 2015-03-15 (×11): 15 mg via ORAL
  Filled 2015-03-04 (×11): qty 1

## 2015-03-04 NOTE — Progress Notes (Signed)
[facility] PHYSICAL MEDICINE & REHABILITATION     PROGRESS NOTE    Subjective/Complaints:   Patient has complained of leg pain more over the last couple days. Family and room. He complains during the day but sleeps well at night. Patient indicates that it's mainly in the left leg below the knee and above the ankle.No falls or other trauma.discussed with family whether the patient has been putting weight on that left leg. He apparently pushes against the footboard of his bed but has not been putting any weight when doing transfers. Using a [employer] lift for transfers. ROS --limited due to cognitive status. leg pain, no foot pain  Objective: Vital Signs: Blood pressure 125/78, pulse 75, temperature 97.9 F (36.6 C), temperature source Oral, resp. rate 18, height  (1.676 m), weight 75.6 kg (166 lb 10.7 oz), SpO2 100 %. No results found.  Recent Labs  03/02/15 0550  WBC 6.0  HGB 12.4*  HCT 34.2*  PLT 233    Recent Labs  03/02/15 0550  NA 138  K 5.2*  CL 105  GLUCOSE 95  BUN 12  CREATININE 0.56*  CALCIUM 9.9   CBG (last 3)  No results for input(s): GLUCAP in the last 72 hours.  Wt Readings from Last 3 Encounters:  03/03/15 75.6 kg (166 lb 10.7 oz)  02/12/15 73.936 kg (163 lb)  07/31/12 63.504 kg (140 lb)    Physical Exam:  Constitutional: He appears well-developed and well-nourished.  Alert.  HENT:  Head: Normocephalic and atraumatic.  Right Ear: External ear normal.  Left Ear: External ear normal.  Eyes: Right eye exhibits no discharge. Left eye exhibits no discharge. No scleral icterus. EOMI.  Neck: Normal range of motion. Neck supple. No JVD present. No tracheal deviation present. No thyromegaly present.   Cardiovascular: Normal rate and regular rhythm.  Respiratory: Effort normal and breath sounds normal. No respiratory distress. He has no wheezes. no rales GI: Soft. Bowel sounds are normal. He exhibits no distension. There is no tenderness. There is no  rebound and no guarding.  PEG tube (foley) in place--skin looks clean/dry.    Musculoskeletal: He exhibits no edema. No pain with range of motion of the knee or ankle, moderate tenderness with palpation of the tibia on the left side, no knee joint swelling, no ankle joint swelling Neurological: speaks, usually in a whisper, occasionally phonates partially.  Moves all 4's, favors the right side over the left. No resting tone. Still some primitive posturing with flexion in the upper and lower limbs which can be broken with verbal and tactile cues, left more than right.. No hypersensitivity to touch in both lower extremities Skin:  Numerous surgical scars on both LE's. All wounds essentially healed in both legs.  Neuro: follows one step commands more quickly. Sitting posture and head position improving.  Psychiatric:  Less distracted, restless.  Assessment/Plan: 1. 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 Physiatrist is providing close team supervision and 24 hour management of active medical problems listed below. Physiatrist and rehab team continue to assess barriers to discharge/monitor patient progress toward functional and medical goals.  Function:  Bathing Bathing position   Position: Bed  Bathing parts   Body parts bathed by helper: Right arm, Left arm, Chest, Abdomen, Front perineal area, Right upper leg, Buttocks, Left upper leg, Right lower leg, Left lower leg, Back  Bathing assist Assist Level: 2 helpers  Upper Body Dressing/Undressing Upper body dressing   What is the patient wearing?: Pull over shirt/dress     Pull over shirt/dress - Perfomed by patient: Thread/unthread right sleeve Pull over shirt/dress - Perfomed by helper: Thread/unthread left sleeve, Put head through opening, Pull shirt over trunk        Upper body assist        Lower Body  Dressing/Undressing Lower body dressing   What is the patient wearing?: Underwear, Pants, Socks   Underwear - Performed by helper: Thread/unthread right underwear leg, Thread/unthread left underwear leg, Pull underwear up/down   Pants- Performed by helper: Thread/unthread right pants leg, Thread/unthread left pants leg, Pull pants up/down   Non-skid slipper socks- Performed by helper: Don/doff right sock, Don/doff left sock   Socks - Performed by helper: Don/doff right sock, Don/doff left sock              Lower body assist Assist for lower body dressing: 2 Designer, multimediaHelpers      Toileting Toileting Toileting activity did not occur: No continent bowel/bladder event   Toileting steps completed by helper: Adjust clothing prior to toileting, Performs perineal hygiene, Adjust clothing after toileting    Toileting assist Assist level: Two helpers   Transfers Chair/bed transfer   Chair/bed transfer method: Lateral scoot Chair/bed transfer assist level: 2 helpers Chair/bed transfer assistive device: Armrests, Sliding board Mechanical lift: Maximove   Locomotion Ambulation Ambulation activity did not occur: Safety/medical concerns (BLE NWB)         Wheelchair Wheelchair activity did not occur: Safety/medical concerns (low level TBI) Type: Manual Max wheelchair distance: 50 Assist Level: Touching or steadying assistance (Pt > 75%)  Cognition Comprehension Comprehension assist level: Understands basic 50 - 74% of the time/ requires cueing 25 - 49% of the time  Expression Expression assist level: Expresses basic 50 - 74% of the time/requires cueing 25 - 49% of the time. Needs to repeat parts of sentences.  Social Interaction Social Interaction assist level: Interacts appropriately 50 - 74% of the time - May be physically or verbally inappropriate.  Problem Solving Problem solving assist level: Solves basic less than 25% of the time - needs direction nearly all the time or does not  effectively solve problems and may need a restraint for safety  Memory Memory assist level: Recognizes or recalls less than 25% of the time/requires cueing greater than 75% of the time   Medical Problem List and Plan: 1. 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 2. DVT Prophylaxis/Anticoagulation: Subcutaneous Lovenox as well as IVC filter 01/12/2015---still indicated 3. Pain Management: have weaned off a lot of pain meds. Really only on prn hydrocodone (which was reduced) and tylenol.   -increasing pain complaints question if this is related to weaning of fentanyl patch or perhaps increased awareness. Will schedule tramadol 100 mg every 8 hours monitor for sedation, start low-dose MS Contin 15 mg tomorrow morning to help with daytime pain 4. Mood/behavior/cognition: increased ritalin to 15mg  to improve attention/initiation further.    -weaning propranolol and klonopin due to their neurosedating effects.  -sleep improving nightly   -continue seroquel 75mg -- decrease to 50mg      -continue sleep chart  -family at bedside for safety reasons---otherwise this patient would require some sort of restraintg 5. Neuropsych: This patient is not capable of making decisions on his own behalf. 6. Skin/Wound Care:  Routine skin checks continue 7. Fluids/Electrolytes/Nutrition: I personally reviewed the patient's labs today.   -K+ increased due to hemolysis. The remainders of labs normal 8. Uncontrollable rhythmic movements/spasticity. Improved. Likely related to distractibility, also on baclofen  - Continue Keppra and Vimpat for now--change to pill/oral form. Taper slightly soon?  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 Dysphagia: now on  D1/thins  -Gastrostomy tube 01/18/2015   -off bolus feeds now, intake much improved  -dc supps and h20 boluses  -continue foley cath for now in place of g-tube which was accidentally pulled out--can remove next week  -all meds changed to oral form   11. Large tension pneumothorax on the right with multiple rib fractures/pulmonary  -?LLL pneumonia---CXR resolved.  -aspiration precautions  -trach stoma closing---may need silver nitrate to hypergranulation area 12. Multiple orthopedic fractures, L1-2 transverse process fractures,/left femur fracture with IM nailing, left and right open tibia fibular fractures, transverse acetabulum fracture status post percutaneous pinning,CM pelvic ring status post ORIF, pubic symphysis and trans-sacral screw forL S I diastasis and right sacralalo fracture. Nonweightbearing bilateral lower extremities 8 weeks until 03/17/2015  Increased pain left tibial area, we'll do follow-up x-ray. Pain may be related to reduction in pain medications. 13. Urine retention/neurogenic bladder: flomax trial, urecholine trial  -spontaneous voiding improved. pvr's slowly decreasing, no I/O cath since yesterday  -timed voiding to continue to improve continence    LOS (Days) 19 A FACE TO FACE EVALUATION WAS PERFORMED  Claudette Laws E 03/04/2015 11:35 AM

## 2015-03-04 NOTE — Progress Notes (Signed)
Physical Therapy Session Note  Patient Details  Name: Juan [name] MRN: [id number] Date of Birth: 01/28/1991  Today's Date: 03/04/2015 PT Individual Time: 0900-1000 PT Individual Time Calculation (min): 60 min   Short Term Goals: Week 3:  PT Short Term Goal 1 (Week 3): Patient will propel wheelchair using BUE in straight path x 150 ft with supervision and max cues.  PT Short Term Goal 2 (Week 3): Patient will maintain sitting balance x 15 min with consistent min A.  PT Short Term Goal 3 (Week 3): Patient will sustain attention to functional tasks x 5 min with max cues.  PT Short Term Goal 4 (Week 3): Patient will perform bed mobility with consistent mod A.   Skilled Therapeutic Interventions/Progress Updates:   Session focused on attention, initiation, sitting balance, functional UE use, pain management, and activity tolerance. Patient sitting up in bed with brother Sharia ReeveJosh supporting him and RN administering medications. Patient c/o increased BLE pain throughout session with repositioning in bed and in wheelchair with increased verbal agitation and decreased frustration tolerance. Patient repeatedly stating, "What do you want from me?" and "I can't...or actually I won't," throughout session, emotional support, max multimodal cues for tasks, education on purpose and goals of therapy, and encouragement provided. Patient required total A to sit edge of bed to perform dressing tasks with max encouragement. Performed slide board transfer with max A x 2 to wheelchair. Patient propelled wheelchair x 150 ft + 75 ft in straight path with min A for steering and hand-over-hand assist to initiate propulsion due to possible apraxia/perseveration at patient repeatedly attempts to push at arm rests or brakes. Switched out standard leg rests for elevating leg rests to trial in attempt to decrease BLE pain, patient reports no improvement and requires max cues for correct use of leg rests as patient bends knees up  and places feet on calf pads of leg rests. Patient performed UBE x 8 min at level 2.0 with initial assist to place BUE on handles, able to sustain attention to task between 10-40 sec at a time with max multimodal cues. Patient left sitting in wheelchair with quick release belt on with brother. Discussed increased lower extremity pain with RN.   Therapy Documentation Precautions:  Precautions Precautions: Fall Precaution Comments: BLE NWB; trach, peg Required Braces or Orthoses: Other Brace/Splint Other Brace/Splint: B resting hand splints; B PRAFO Restrictions Weight Bearing Restrictions: Yes RLE Weight Bearing: Non weight bearing LLE Weight Bearing: Non weight bearing Pain: Pain Assessment Faces Pain Scale: Hurts whole lot Pain Type: Acute pain Pain Location: Leg Pain Orientation: Right;Left Pain Descriptors / Indicators: Grimacing;Discomfort;Sore;Aching Pain Onset: On-going Pain Intervention(s): RN made aware, repositioned, rest  See Function Navigator for Current Functional Status.   Therapy/Group: Individual Therapy  Kerney ElbeVarner, Ashwin Tibbs A 03/04/2015, 12:33 PM

## 2015-03-05 ENCOUNTER — Inpatient Hospital Stay (HOSPITAL_COMMUNITY): Payer: 59 | Admitting: Occupational Therapy

## 2015-03-05 ENCOUNTER — Inpatient Hospital Stay (HOSPITAL_COMMUNITY): Payer: 59

## 2015-03-05 ENCOUNTER — Inpatient Hospital Stay (HOSPITAL_COMMUNITY): Payer: 59 | Admitting: Speech Pathology

## 2015-03-05 ENCOUNTER — Inpatient Hospital Stay (HOSPITAL_COMMUNITY): Payer: 59 | Admitting: Physical Therapy

## 2015-03-05 NOTE — Progress Notes (Signed)
Speech Language Pathology Daily Session Note  Patient Details  Name: Juan [name] MRN: [id number] Date of Birth: 03/19/1991  Today's Date: 03/05/2015 SLP Individual Time: 0900-0958 SLP Individual Time Calculation (min): 58 min  Short Term Goals: Week 3: SLP Short Term Goal 1 (Week 3): Patient will sustain attention to a task for 5 minutes with Max A multimodal cues.  SLP Short Term Goal 2 (Week 3): Patient will initiate functional tasks in 90% of opportunities with Mod A multimodal cues.  SLP Short Term Goal 3 (Week 3): Patient will vocalize with Max A multimodal cues in 25% of opportunities  SLP Short Term Goal 4 (Week 3): Patient will express basic wants/needs in 90% of opportunities with Mod A multimodal cues.  SLP Short Term Goal 5 (Week 3): Patient will consume trials of Dys. 2 textures without overt s/s of aspiration over 2 consecutive sesions with Max A multimodal cues.  SLP Short Term Goal 6 (Week 3): Patient will consume current diet with minimal overt s/s of aspiration with Mod A verbal cues for use of swallowing compensatory strategies.   Skilled Therapeutic Interventions:  Pt was seen for skilled ST targeting goals for cognition and dysphagia.  SLP facilitated the session with a trial of dys 2 textures to continue working towards diet progression.  Pt consumed trials with no overt s/s of aspiration and complete clearance of solids from the oral cavity post swallow.  Pt utilized rate and portion control with overall min assist cues during with solids and thin liquids via straw.  Recommend observing pt with advanced textures during a functional meal prior to diet advancement.  SLP also facilitated the session with a basic money management task targeting sustained attention to task and task inititiation.  Pt sorted coins into groups by value with max assist verbal and visual cues.  Pt was returned to room, left upright in wheelchair with quick release belt donned and family present.   Continue per current plan of care.    Function:  Eating Eating   Modified Consistency Diet: Yes Eating Assist Level: Supervision or verbal cues;Helper checks for pocketed food;Help managing cup/glass   Eating Set Up Assist For: Opening containers Helper Scoops Food on Utensil: Occasionally     Cognition Comprehension Comprehension assist level: Understands basic 50 - 74% of the time/ requires cueing 25 - 49% of the time  Expression   Expression assist level: Expresses basic 25 - 49% of the time/requires cueing 50 - 75% of the time. Uses single words/gestures.  Social Interaction Social Interaction assist level: Interacts appropriately 50 - 74% of the time - May be physically or verbally inappropriate.  Problem Solving Problem solving assist level: Solves basic less than 25% of the time - needs direction nearly all the time or does not effectively solve problems and may need a restraint for safety  Memory Memory assist level: Recognizes or recalls 25 - 49% of the time/requires cueing 50 - 75% of the time    Pain Pain Assessment Pain Assessment: No/denies pain  Therapy/Group: Individual Therapy  Juan [name], Melanee SpryNicole L 03/05/2015, 12:36 PM

## 2015-03-05 NOTE — Progress Notes (Signed)
Occupational Therapy Session Note  Patient Details  Name: Juan [name] MRN: [id number] Date of Birth: 01/02/1991  Today's Date: 03/05/2015 OT Individual Time: 1400-1430 OT Individual Time Calculation (min): 30 min    Short Term Goals: Week 3:  OT Short Term Goal 1 (Week 3): Pt will maintain dynamic sitting balance EOB during dressing tasks with min A for 15 mins. OT Short Term Goal 2 (Week 3): Pt will perform UB dressing tasks with mod A OT Short Term Goal 3 (Week 3): Pt will bathe UB with min A while seated in supported position with min verbal cues for initiation OT Short Term Goal 4 (Week 3): Pt will exhibit sustained attention during ADLs for 3 min with min verbal cues  Skilled Therapeutic Interventions/Progress Updates: Therapeutic activity with focus on improved attention, visual scanning, slide board transfer, dynamic sitting balance.   Pt received from physical therapist, alert and responsive to cues from therapist.   Pt challenged to remember part of slide board transfer sequence in prep for transfer back to bed d/t x-ray order requiring transfer.  Pt recalled 1 step in process "sit on the board" and then completed transfer with girlfriend stabilizing w/c as therapist assisted in transfer (max assist).   Pt then sat at edge of bed for 4 minutes attending to picture poster of family with mod vc from girlfriend and therapist to locate family members.   Pt required steadying assist while sitting unsupported throughout session d/t trunk weakness and vestibular impairment.     Therapy Documentation Precautions:  Precautions Precautions: Fall Precaution Comments: BLE NWB; trach, peg Required Braces or Orthoses: Other Brace/Splint Other Brace/Splint: B resting hand splints; B PRAFO Restrictions Weight Bearing Restrictions: Yes RLE Weight Bearing: Non weight bearing LLE Weight Bearing: Non weight bearing  Pain: Pain Assessment Pain Assessment: No/denies pain  ADL: ADL ADL  Comments: see FIM  See Function Navigator for Current Functional Status.   Therapy/Group: Individual Therapy  Juan [name] 03/05/2015, 2:38 PM

## 2015-03-05 NOTE — Progress Notes (Signed)
Occupational Therapy Session Note  Patient Details  Name: Juan [name] MRN: [id number] Date of Birth: 08/02/1990  Today's Date: 03/05/2015 OT Individual Time: 1130-1200 OT Individual Time Calculation (min): 30 min    Short Term Goals: Week 3:  OT Short Term Goal 1 (Week 3): Pt will maintain dynamic sitting balance EOB during dressing tasks with min A for 15 mins. OT Short Term Goal 2 (Week 3): Pt will perform UB dressing tasks with mod A OT Short Term Goal 3 (Week 3): Pt will bathe UB with min A while seated in supported position with min verbal cues for initiation OT Short Term Goal 4 (Week 3): Pt will exhibit sustained attention during ADLs for 3 min with min verbal cues  Skilled Therapeutic Interventions/Progress Updates:    Treatment session with focus on initiation and sequencing with grooming task and dynamic sitting balance with reaching.  Completed oral hygiene at sink, noted perseveration with brushing and rinsing mouth, including spitting once moved back from sink.  Pt required mod question cues for problem solving with opening and applying toothpaste.  Engaged in reaching task with reaching for and tossing football to therapist, with pt able to reach outside BOS without LOB.  Required cues for technique with tossing small ball back and forth between hands as pt continued to attempt to pass ball to therapist or bounce it on the floor.  Therapy Documentation Precautions:  Precautions Precautions: Fall Precaution Comments: BLE NWB; trach, peg Required Braces or Orthoses: Other Brace/Splint Other Brace/Splint: B resting hand splints; B PRAFO Restrictions Weight Bearing Restrictions: Yes RLE Weight Bearing: Non weight bearing LLE Weight Bearing: Non weight bearing General:   Vital Signs: Therapy Vitals Pulse Rate: 87 BP: 130/79 mmHg Patient Position (if appropriate): Sitting Oxygen Therapy SpO2: 100 % Pain:  Pt with no c/o pain ADL: ADL ADL Comments: see FIM  See  Function Navigator for Current Functional Status.   Therapy/Group: Individual Therapy  Rosalio LoudHOXIE, Georgina Krist 03/05/2015, 12:27 PM

## 2015-03-05 NOTE — Progress Notes (Signed)
Physical Therapy Session Note  Patient Details  Name: Juan [name] MRN: [id number] Date of Birth: 04/25/1991  Today's Date: 03/05/2015 PT Individual Time: 1300-1400 PT Individual Time Calculation (min): 60 min   Short Term Goals: Week 3:  PT Short Term Goal 1 (Week 3): Patient will propel wheelchair using BUE in straight path x 150 ft with supervision and max cues.  PT Short Term Goal 2 (Week 3): Patient will maintain sitting balance x 15 min with consistent min A.  PT Short Term Goal 3 (Week 3): Patient will sustain attention to functional tasks x 5 min with max cues.  PT Short Term Goal 4 (Week 3): Patient will perform bed mobility with consistent mod A.   Skilled Therapeutic Interventions/Progress Updates:   Slide board transfers tilt in space w/c> bed> regular manual w/c.  Bed had to be raised during transfer to regular w/c to prevent pt from sliding forward and bearing wt through bilLEs.  W/c propulsion using bil UEs, x approx 30' in a straight path before veering L or R, requiring min assist to correct.  Pt perseverated on placing bil hands on w/c brakes rather than rims initially during session, but this decreased over time.   PT provided hand over hand assistance for turns, but pt demonstrated no carryover of technique to turn w/c.  He propelled up 3 degree ramped [facility], in a straight line without difficulty; when descending, however, he needed max assist to slow w/c down.  Pt fatigued during w/c propulsion x 300' requiring 2 brief rest breaks.  Therapeutic activity in sitting in w/c, tapping beach ball with 2# weighted bar , x 10 with hand over hand assistance, then x 10 without assistance, demonstrating functional motor control bil UEs to tap beach ball over 5' distance accurately.  Pt returned to room and passed off to OT for next tx.    Therapy Documentation Precautions:  Precautions Precautions: Fall Precaution Comments: BLE NWB; trach, peg Required  Braces or Orthoses: Other Brace/Splint Other Brace/Splint: B resting hand splints; B PRAFO Restrictions Weight Bearing Restrictions: Yes RLE Weight Bearing: Non weight bearing LLE Weight Bearing: Non weight bearing General:   Vital Signs: Therapy Vitals Temp: 98.6 F (37 C) Temp Source: Oral Pulse Rate: 78 BP: (!) 101/41 mmHg Patient Position (if appropriate): Lying Oxygen Therapy SpO2: 95 % Pain: Pain Assessment Pain Assessment: No/denies pain       See Function Navigator for Current Functional Status.   Therapy/Group: Individual Therapy  Juan [name] 03/05/2015, 4:22 PM

## 2015-03-05 NOTE — Progress Notes (Signed)
Nursing Note: Pt scanned for > 600 cc.A; Pt in and out cathed for 550 cc per policy.Pt tolerated well.wbb
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
## 2015-03-05 NOTE — Progress Notes (Signed)
Occupational Therapy Session Note  Patient Details  Name: GREGORIO WORLEY MRN: [id number] Date of Birth: 1991/04/23  Today's Date: 03/05/2015 OT Individual Time: 1030-1100 OT Individual Time Calculation (min): 30 min    Short Term Goals: Week 1:  OT Short Term Goal 1 (Week 1): Pt will tolerate sitting in upright postion in chair for 1.5 with appropriate sitting system OT Short Term Goal 1 - Progress (Week 1): Met OT Short Term Goal 2 (Week 1): Pt will visually track to stimulus 75% of time with extra time.  OT Short Term Goal 2 - Progress (Week 1): Met OT Short Term Goal 3 (Week 1): Pt will demonstrate focused attention 5 seconds with max A with extra time in prep for functional task OT Short Term Goal 3 - Progress (Week 1): Met Week 2:  OT Short Term Goal 1 (Week 2): Pt will sit EOB with min A for 15 min to participate in BADLs OT Short Term Goal 1 - Progress (Week 2): Met OT Short Term Goal 2 (Week 2): Pt will wash face with min A and min verbal cues to initiate task OT Short Term Goal 2 - Progress (Week 2): Met OT Short Term Goal 3 (Week 2): Pt will perform UB dressing tasks with max A OT Short Term Goal 3 - Progress (Week 2): Met OT Short Term Goal 4 (Week 2): Pt will exhibit sustained attention for 1 min with mod verbal cues OT Short Term Goal 4 - Progress (Week 2): Met Week 3:  OT Short Term Goal 1 (Week 3): Pt will maintain dynamic sitting balance EOB during dressing tasks with min A for 15 mins. OT Short Term Goal 2 (Week 3): Pt will perform UB dressing tasks with mod A OT Short Term Goal 3 (Week 3): Pt will bathe UB with min A while seated in supported position with min verbal cues for initiation OT Short Term Goal 4 (Week 3): Pt will exhibit sustained attention during ADLs for 3 min with min verbal cues  Skilled Therapeutic Interventions/Progress Updates:    Pt seen this session to facilitate strength and coordination of BUE to prepare for future ambulation when pt is no  longer on WB precautions.  Pt received in w/c in room with mom present. She stated his self care was completed, pt taken to day room where he stated he wanted to improve his grip strength. Pt cooperative and motivated this session with good attention to task and followed 1 step directions consistently. Answered questions with 1-2 words. Pt worked with 2# dowel bar on over head reaches at various angles of tilt in chair to engage his core muscles as he flexed forward to lift bar. Pt also worked on trunk control as both arms extended overhead. Discussed pt's past job and his goals to walk again. Pt taken back to room and suggested to his mother to buy him a resistive grip ball to use at home. Pt used weighted dowel but needs constant supervision due to his difficulty with coordination. Pt resting in chair in his room with his family with him.    Therapy Documentation Precautions:  Precautions Precautions: Fall Precaution Comments: BLE NWB; trach, peg Required Braces or Orthoses: Other Brace/Splint Other Brace/Splint: B resting hand splints; B PRAFO Restrictions Weight Bearing Restrictions: Yes RLE Weight Bearing: Non weight bearing LLE Weight Bearing: Non weight bearing    Vital Signs: Therapy Vitals Pulse Rate: 87 BP: 130/79 mmHg Patient Position (if appropriate): Sitting Oxygen Therapy SpO2: 100 %  Pain: Pain Assessment Pain Assessment: No/denies pain  ADL: ADL ADL Comments: see FIM  See Function Navigator for Current Functional Status.   Therapy/Group: Individual Therapy  [place] 03/05/2015, 12:51 PM

## 2015-03-06 ENCOUNTER — Inpatient Hospital Stay (HOSPITAL_COMMUNITY): Payer: 59 | Admitting: *Deleted

## 2015-03-06 ENCOUNTER — Inpatient Hospital Stay (HOSPITAL_COMMUNITY): Payer: Self-pay

## 2015-03-06 ENCOUNTER — Inpatient Hospital Stay (HOSPITAL_COMMUNITY): Payer: 59 | Admitting: Speech Pathology

## 2015-03-06 DIAGNOSIS — R569 Unspecified convulsions: Secondary | ICD-10-CM

## 2015-03-06 DIAGNOSIS — S32810S Multiple fractures of pelvis with stable disruption of pelvic ring, sequela: Secondary | ICD-10-CM

## 2015-03-06 DIAGNOSIS — T791XXS Fat embolism (traumatic), sequela: Secondary | ICD-10-CM

## 2015-03-06 DIAGNOSIS — G259 Extrapyramidal and movement disorder, unspecified: Secondary | ICD-10-CM

## 2015-03-06 DIAGNOSIS — R1314 Dysphagia, pharyngoesophageal phase: Secondary | ICD-10-CM

## 2015-03-06 MED ORDER — PANTOPRAZOLE SODIUM 40 MG PO TBEC
40.0000 mg | DELAYED_RELEASE_TABLET | Freq: Every day | ORAL | Status: DC
  Administered 2015-03-07 – 2015-03-20 (×14): 40 mg via ORAL
  Filled 2015-03-06 (×14): qty 1

## 2015-03-06 MED ORDER — LEVETIRACETAM 750 MG PO TABS
1500.0000 mg | ORAL_TABLET | Freq: Two times a day (BID) | ORAL | Status: DC
  Administered 2015-03-06 – 2015-03-07 (×2): 1500 mg via ORAL
  Filled 2015-03-06 (×2): qty 2
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
## 2015-03-06 NOTE — Progress Notes (Signed)
Social Work Patient ID: Juan PilotJacob S [name], male   DOB: 07/11/1990, 24 y.o.   MRN: [id number]  Contacted this morning by Juan [name] with insurance reporting that Medical Director has reviewed notes from this past week and does not feel pt meets criteria to stay on CIR.  Authorizing coverage through today if plan is to return home and for two more days if need to find SNF bed. Have discussed with pt's mother and father and have explained to her that we/ family wish to appeal this decision.  She has instructed me on the appeal process and I have submitted documentation for "Level 1 Appeal - Expedited".   Await decision which we should have within 24/ hours.  Parents very upset by this decision and have attempted to calm as best I can.  They are aware that we are doing all we can to get this decision reversed and must wait at this point.  Father is also working with his HR/ employer to petition decision from their end as the company is self - insured.    Mother also aware that we will continue to train her and family on pt's current level of care needs in preparation is this appeal is denied.  Will keep team and family posted as I receive word.  Juan Schindler, LCSW

## 2015-03-06 NOTE — Progress Notes (Signed)
Social Work Patient ID: [name], male   DOB: 01/17/1991, 24 y.o.   MRN: [id number]  Received call this afternoon from insurance stated they have reviewed documentation and have approved coverage through 10/25 with update needed on 10/24.  Have alerted tx team and parents.  Leib Elahi, LCSW

## 2015-03-06 NOTE — Progress Notes (Signed)
Occupational Therapy Note  Patient Details  Name: Juan [name] MRN: [id number] Date of Birth: 08/06/1990  Today's Date: 03/06/2015 OT Individual Time: 1400-1430 OT Individual Time Calculation (min): 30 min   Pt denied pain Individual therapy  Pt engaged in BUE task with cognitive components.  Pt required to assemble structure using PBC piping and replicating structure in picture.  Pt used BUE throughout session but required max verbal cues for correct placement of piping.  Pt placed the initial piece properly but perseverated on placing all following pieces in identical place.  Pt required hand over hand assist for correct placement.  Discussed and educated patient on purpose of therapy and reasoning for challenging him with difficult tasks.  Pt verbalized understanding.  Pt's affect was more flat than usual and required mod verbal cues/encouragement to verbally engage with therapist.  Focus on BUE use, problem solving, and following commands/requests to increased independence with BADLs and functional tasks.   Juan [name], [facility] [name] [facility] 03/06/2015, 2:44 PM

## 2015-03-06 NOTE — Progress Notes (Signed)
Speech Language Pathology Weekly Progress and Session Note  Patient Details  Name: Juan [name] MRN: [id number] Date of Birth: 1990/06/19  Beginning of progress report period: October 11,2016 End of progress report period: March 06, 2015   Today's Date: 03/06/2015 SLP Individual Time: 9826-4158 SLP Individual Time Calculation (min): 56 min  Short Term Goals: Week 3: SLP Short Term Goal 1 (Week 3): Patient will sustain attention to a task for 5 minutes with Max A multimodal cues.  SLP Short Term Goal 1 - Progress (Week 3): Progressing toward goal SLP Short Term Goal 2 (Week 3): Patient will initiate functional tasks in 90% of opportunities with Mod A multimodal cues.  SLP Short Term Goal 2 - Progress (Week 3): Met SLP Short Term Goal 3 (Week 3): Patient will vocalize with Max A multimodal cues in 25% of opportunities  SLP Short Term Goal 3 - Progress (Week 3): Progressing toward goal SLP Short Term Goal 4 (Week 3): Patient will express basic wants/needs in 90% of opportunities with Mod A multimodal cues.  SLP Short Term Goal 4 - Progress (Week 3): Progressing toward goal SLP Short Term Goal 5 (Week 3): Patient will consume trials of Dys. 2 textures without overt s/s of aspiration over 2 consecutive sesions with Max A multimodal cues.  SLP Short Term Goal 5 - Progress (Week 3): Met SLP Short Term Goal 6 (Week 3): Patient will consume current diet with minimal overt s/s of aspiration with Mod A verbal cues for use of swallowing compensatory strategies.  SLP Short Term Goal 6 - Progress (Week 3): Met    New Short Term Goals: Week 4: SLP Short Term Goal 1 (Week 4): Patient will sustain attention to a task for 5 minutes with Max A multimodal cues.  SLP Short Term Goal 2 (Week 4): Patient will initiate functional tasks in 90% of opportunities with Mod A verbal cues.  SLP Short Term Goal 3 (Week 4): Patient will vocalize with Max A multimodal cues in 25% of opportunities  SLP Short Term  Goal 4 (Week 4): Patient will express basic wants/needs in 90% of opportunities with Mod A multimodal cues.  SLP Short Term Goal 5 (Week 4): Patient will dys 2 textures and thin liquids with minimal overt s/s of aspiration with Mod A verbal cues for use of swallowing compensatory strategies.   Weekly Progress Updates:  Pt made functional gains this reporting period and has met 3 out of 6 short term goals.  Pt is currently consuming dys 2 textures and thin liquids with full supervision for use of swallowing precautions.  Pt has demonstrated improvements in initiation but continues to require max assist during functional tasks due to decreased sustained attention which impacts all higher level cognitive processes.  Pt remains aphonic but has demonstrated improvements in his ability to convey needs/wants to caregivers.  Pt would continue to benefit from skilled ST while inpatient in order to maximize functional independence and reduce burden of care prior to discharge.  Pt and family education is ongoing.     Intensity: Minumum of 1-2 x/day, 30 to 90 minutes Frequency: 3 to 5 out of 7 days Duration/Length of Stay: 03/20/15 Treatment/Interventions: English as a second language teacher;Dysphagia/aspiration precaution training;Environmental controls;Functional tasks;Therapeutic Activities;Patient/family education;Cognitive remediation/compensation;Internal/external aids;Speech/Language facilitation   Daily Session  Skilled Therapeutic Interventions: Pt was seen for skilled ST targeting goals for dysphagia and communication.  SLP facilitated the session with a trial meal tray of dys 2 textures to continue working towards diet progression.  Pt consumed  advanced consistencies with max faded to mod multimodal cues for portion control.  Pt's mastication of solids was slowed but effective as he was able to clear residuals from the oral cavity post swallow with min verbal cues for use of liquid wash.  No overt s/s of aspiration were  evident with solids or liquids.  Recommend diet advancement to dys 2 textures with continued thin liquids and full supervision for use of swallowing precautions.  Pt's mom and girlfriend made aware.  SLP provided pt's family with handout of recommended diet textures.  SLP also facilitated the session with diaphragmatic breathing techniques with the use of manipulatives targeting coordination of respiration with onset of voicing.  Pt returned demonstration of techniques with min verbal cues; however, skills targeted did not appear to carryover in conversations with SLP as pt remained aphonic.  Pt was left in wheelchair with call bell within reach.  Goals updated on this date to reflect current progress and plan of care.     Function:   Eating Eating   Modified Consistency Diet: Yes Eating Assist Level: Supervision or verbal cues;Helper brings food to mouth;Help managing cup/glass;Set up assist for;Helper scoops food on utensil   Eating Set Up Assist For: Opening containers Helper Scoops Food on Utensil: Occasionally     Cognition Comprehension Comprehension assist level: Understands basic 50 - 74% of the time/ requires cueing 25 - 49% of the time  Expression Expression assistive device: Talk trach valve Expression assist level: Expresses basic 25 - 49% of the time/requires cueing 50 - 75% of the time. Uses single words/gestures.  Social Interaction Social Interaction assist level: Interacts appropriately 50 - 74% of the time - May be physically or verbally inappropriate.  Problem Solving Problem solving assist level: Solves basic less than 25% of the time - needs direction nearly all the time or does not effectively solve problems and may need a restraint for safety  Memory Memory assist level: Recognizes or recalls 25 - 49% of the time/requires cueing 50 - 75% of the time   General    Pain Pain Assessment Pain Assessment: No/denies pain  Therapy/Group: Individual Therapy  Shanikqua Zarzycki, Selinda Orion 03/06/2015, 3:49 PM

## 2015-03-06 NOTE — Progress Notes (Signed)
[facility] PHYSICAL MEDICINE & REHABILITATION     PROGRESS NOTE    Subjective/Complaints:  Pt seen and examined this AM sitting up in bed, eating his breakfast.  Pt slept well overnight and recently awoke. Pt's family with questions regarding pt's pain medication and potential side eddects, PEG tube, and x-ray from yesterday.    Questions addressed.   ROS --limited due to cognitive status. leg pain, no foot pain  Objective: Vital Signs: Blood pressure 115/73, pulse 94, temperature 98.6 F (37 C), temperature source Oral, resp. rate 17, height  (1.676 m), weight 68.1 kg (150 lb 2.1 oz), SpO2 100 %. Dg Tibia/fibula Left  03/05/2015  CLINICAL DATA:  Comminuted left tibial and fibular fractures. Subsequent encounter. EXAM: LEFT TIBIA AND FIBULA - 2 VIEW COMPARISON:  February 07, 2015. FINDINGS: Status post intra medullary rod fixation of comminuted fracture involving the proximal left tibial shaft. Increased callus formation is noted suggesting healing fracture, although persistent fracture line is noted. Increased callus formation also seen involving displaced mid fibular fracture consistent with healing fracture. IMPRESSION: Increased callus formation is seen involving fibular fracture consistent with healing. Mildly increased amount of callus formation is seen involving the tibial fracture, although significant lucency remains. Status post intra medullary rod fixation of proximal tibial fracture. Electronically Signed   By: Lupita Raider, M.D.   On: 03/05/2015 15:32   No results for input(s): WBC, HGB, HCT, PLT in the last 72 hours. No results for input(s): NA, K, CL, GLUCOSE, BUN, CREATININE, CALCIUM in the last 72 hours.  Invalid input(s): CO CBG (last 3)  No results for input(s): GLUCAP in the last 72 hours.  Wt Readings from Last 3 Encounters:  03/06/15 68.1 kg (150 lb 2.1 oz)  02/12/15 73.936 kg (163 lb)  07/31/12 63.504 kg (140 lb)    Physical Exam:  Constitutional: He  appears well-developed and well-nourished.  Alert.  HENT:  Head: Normocephalic and atraumatic.  Right Ear: External ear normal.  Left Ear: External ear normal.  Eyes: Right eye exhibits no discharge. Left eye exhibits no discharge. No scleral icterus. EOMI.  Neck: Normal range of motion. Neck supple. No JVD present. No tracheal deviation present. No thyromegaly present. Hypergranulation over closed stoma--associated drainage Cardiovascular: Normal rate and regular rhythm.  Respiratory: Effort normal and breath sounds normal. No respiratory distress. He has no wheezes. no rales GI: Soft. Bowel sounds are normal. He exhibits no distension. There is no tenderness. There is no rebound and no guarding.  PEG tube (foley) in place--skin looks clean/dry.   Musculoskeletal: He exhibits no edema. No pain with range of motion of the knee or ankle, moderate tenderness with palpation of the tibia on the left side, no knee joint swelling, no ankle joint swelling Neurological: speaks, usually in a whisper, occasionally phonates partially.  Moves all extremities.  Still some primitive posturing with flexion in the upper and lower limbs which can be broken with verbal and tactile cues, left more than right.. No hypersensitivity to touch in both lower extremities Skin:  Numerous surgical scars on both LE's. All wounds essentially healed in both legs.  Neuro: follows one step commands more quickly. Sitting posture and head position improving.  Psychiatric:  Somnolent this AM. ss distracted, restless.  Assessment/Plan: 1. 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 Physiatrist is providing close team supervision and 24 hour management of active medical problems listed below.  Physiatrist and rehab team continue to assess barriers to discharge/monitor patient progress toward functional and medical  goals.  Function:  Bathing Bathing position   Position: Bed  Bathing parts Body parts bathed by patient: Chest, Front perineal area Body parts bathed by helper: Right arm, Left arm, Abdomen, Buttocks, Right upper leg, Left upper leg, Right lower leg, Left lower leg, Back  Bathing assist Assist Level: Touching or steadying assistance(Pt > 75%)      Upper Body Dressing/Undressing Upper body dressing   What is the patient wearing?: Hospital gown     Pull over shirt/dress - Perfomed by patient: Thread/unthread right sleeve Pull over shirt/dress - Perfomed by helper: Thread/unthread left sleeve, Put head through opening, Pull shirt over trunk        Upper body assist Assist Level: Touching or steadying assistance(Pt > 75%)      Lower Body Dressing/Undressing Lower body dressing   What is the patient wearing?: Hospital Gown   Underwear - Performed by helper: Thread/unthread right underwear leg, Thread/unthread left underwear leg, Pull underwear up/down   Pants- Performed by helper: Thread/unthread right pants leg, Thread/unthread left pants leg, Pull pants up/down   Non-skid slipper socks- Performed by helper: Don/doff right sock, Don/doff left sock   Socks - Performed by helper: Don/doff right sock, Don/doff left sock              Lower body assist Assist for lower body dressing: Touching or steadying assistance (Pt > 75%)      Toileting Toileting Toileting activity did not occur: No continent bowel/bladder event   Toileting steps completed by helper: Adjust clothing prior to toileting, Performs perineal hygiene, Adjust clothing after toileting    Toileting assist Assist level: Two helpers   Transfers Chair/bed transfer   Chair/bed transfer method: Lateral scoot Chair/bed transfer assist level: 2 helpers Chair/bed transfer assistive device: Armrests, Sliding board Mechanical lift: Maximove   Locomotion Ambulation Ambulation activity did not occur: Safety/medical  concerns (BLE NWB)         Wheelchair Wheelchair activity did not occur: Safety/medical concerns (low level TBI) Type: Manual Max wheelchair distance: 300 Assist Level: Touching or steadying assistance (Pt > 75%)  Cognition Comprehension Comprehension assist level: Understands basic 50 - 74% of the time/ requires cueing 25 - 49% of the time  Expression Expression assist level: Expresses basic 25 - 49% of the time/requires cueing 50 - 75% of the time. Uses single words/gestures.  Social Interaction Social Interaction assist level: Interacts appropriately 50 - 74% of the time - May be physically or verbally inappropriate.  Problem Solving Problem solving assist level: Solves basic 25 - 49% of the time - needs direction more than half the time to initiate, plan or complete simple activities  Memory Memory assist level: Recognizes or recalls 25 - 49% of the time/requires cueing 50 - 75% of the time   Medical Problem List and Plan: 1. 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 2. DVT Prophylaxis/Anticoagulation: Subcutaneous Lovenox as well as IVC filter 01/12/2015---still indicated 3. Pain Management: have weaned off a lot of pain meds.    -increasing pain complaints likely due more awareness and coming off fentanyl patch.   - continue tramadol 100 mg every 8 hours monitor for sedation, started low-dose MS Contin 15 mg morning to help with daytime pain  - Family concerned about side  effects of PRN pain meds, however, pt did not receive any yesterday.  Will cont to monitor for side effects vs. Fatigue. 4. Mood/behavior/cognition: ritalin  to improve attention/initiation further.    -propranolol weaned.  Weaning klonopin due to neurosedating effects.  -sleep improving   -seroquel -- decreased to       -continue sleep chart  -family at bedside for safety reasons---otherwise this patient would require some sort of restraintg 5. Neuropsych: This patient is not capable of making decisions on his own behalf. 6. Skin/Wound Care: Routine skin checks continue 7. Fluids/Electrolytes/Nutrition:   -K+ increased due to hemolysis. The remainders of labs normal  - Will reorder labs for tomorrow 8. Uncontrollable rhythmic movements/spasticity. Improved. Likely related to distractibility, also on baclofen  - Continue Keppra and Vimpat for now--change to pill/oral form. Taper slightly soon?  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 Dysphagia: now on D1/thins  -Gastrostomy tube 01/18/2015   -off bolus feeds now, needs to push po  -dc supps and h20 boluses  -continue foley cath for now in place of g-tube which was accidentally pulled out--can remove this week  -all meds changed to oral form   11. Large tension pneumothorax on the right with multiple rib fractures/pulmonary  -?LLL pneumonia---CXR resolved.  -aspiration precautions  -trach stoma closing---apply silver nitrate to hypergranulation area 12. Multiple orthopedic fractures, L1-2 transverse process fractures,/left femur fracture with IM nailing, left and right open tibia fibular fractures, transverse acetabulum fracture status post percutaneous pinning,CM pelvic ring status post ORIF, pubic symphysis and trans-sacral screw forL S I diastasis and right sacralalo fracture. Nonweightbearing bilateral lower extremities 8 weeks until 03/17/2015  Increased pain left tibial area, follow-up x-ray personally reviewed, which shows healing of fibula >tibia.   13. Urine retention/neurogenic bladder: flomax trial, urecholine trial  -spontaneous voiding improved. pvr's slowly decreasing, no I/O cath since yesterday  -timed voiding to continue to improve continence   LOS  (Days) 21 A FACE TO FACE EVALUATION WAS PERFORMED  Shalayah Beagley Karis Juba 03/06/2015 11:00 AM

## 2015-03-06 NOTE — Progress Notes (Signed)
Occupational Therapy Session Note  Patient Details  Name: Juan [name] MRN: [id number] Date of Birth: 02/23/1991  Today's Date: 03/06/2015 OT Individual Time: 0800-0900 OT Individual Time Calculation (min): 60 min    Short Term Goals: Week 3:  OT Short Term Goal 1 (Week 3): Pt will maintain dynamic sitting balance EOB during dressing tasks with min A for 15 mins. OT Short Term Goal 2 (Week 3): Pt will perform UB dressing tasks with mod A OT Short Term Goal 3 (Week 3): Pt will bathe UB with min A while seated in supported position with min verbal cues for initiation OT Short Term Goal 4 (Week 3): Pt will exhibit sustained attention during ADLs for 3 min with min verbal cues  Skilled Therapeutic Interventions/Progress Updates:    Pt resting in bed with girlfriend upon arrival.  Pt's girlfriend stated that patient had last voided at 2 AM.  Pt performed sliding board transfer to drop arm BSC requiring tot A + 2 for safety and holding the sliding board.  Pt followed commands for lateral leans but required assistance to place board. Pt was able to sit unsupported on BSC while attempting to void.  Pt returned to EOB to complete dressing tasks.  Pt was able to don his pullover shirt with steady A this morning after shirt was oriented.  Pt was able to lean to right and left on command to facilitate pulling up pants.  Pt completed grooming tasks at sink.  Pt washed face and hands without assistance when provided with supplies.  Pt completed brushing teeth with setup this morning.  Pt engaged in Dynavision activities with the following results:  10/14 - targets hit-13; avg reaction time -4.62 seconds 10/18 - targets hit-21; avg reaction time - 2.86 seconds  Pt also engaged in w/c mobility with focus on attention to environment and avoiding propelling into wall and objects.  Therapy Documentation Precautions:  Precautions Precautions: Fall Precaution Comments: BLE NWB; trach, peg Required Braces  or Orthoses: Other Brace/Splint Other Brace/Splint: B resting hand splints; B PRAFO Restrictions Weight Bearing Restrictions: Yes RLE Weight Bearing: Non weight bearing LLE Weight Bearing: Non weight bearing  See Function Navigator for Current Functional Status.   Therapy/Group: Individual Therapy  Juan [name], Juan [name] 03/06/2015, 11:13 AM

## 2015-03-06 NOTE — Progress Notes (Signed)
Physical Therapy Session Note  Patient Details  Name: Juan PilotJacob S XXXHunt MRN: [id number] Date of Birth: 06/06/1990  Today's Date: 03/06/2015 PT Individual Time: 0900-0955 PT Individual Time Calculation (min): 55 min   Short Term Goals: Week 3:  PT Short Term Goal 1 (Week 3): Patient will propel wheelchair using BUE in straight path x 150 ft with supervision and max cues.  PT Short Term Goal 2 (Week 3): Patient will maintain sitting balance x 15 min with consistent min A.  PT Short Term Goal 3 (Week 3): Patient will sustain attention to functional tasks x 5 min with max cues.  PT Short Term Goal 4 (Week 3): Patient will perform bed mobility with consistent mod A.   Skilled Therapeutic Interventions/Progress Updates:   Patient seen in manual wheelchair, handoff from OT. Wheelchair propulsion using BUE x 150 ft with focus on functional problem solving for steering in straight path and hand over hand assistance to initiate propulsion on wheelchair rims due to perseveration on brakes/arm rests. Patient also required therapist to remove one UE from wheels in order to successfully turn. From wheelchair level, patient completed bounce pass/catch with recreational therapist while answering questions regarding likes and interests. Patient required mod multimodal cues to report accurate biographical information. Patient performed Dynavision from wheelchair level x 3 trials: 14 hits in 4.29 sec, 17 hits in 3.53 sec, and 18 hits in 3.33 sec, demonstrating consistent improvement with repetition and functional use BUE (although patient appears to favor use of LUE). Patient demonstrates compensatory trunk movements to achieve functional reach but requires supervision only for balance from wheelchair. Patient requested to return to bed at end of session due to fatigue, left semi reclined in bed with SO [name] present.   Therapy Documentation Precautions:  Precautions Precautions: Fall Precaution Comments: BLE NWB;  trach, peg Required Braces or Orthoses: Other Brace/Splint Other Brace/Splint: B resting hand splints; B PRAFO Restrictions Weight Bearing Restrictions: Yes RLE Weight Bearing: Non weight bearing LLE Weight Bearing: Non weight bearing Pain: Pain Assessment Pain Assessment: No/denies pain   See Function Navigator for Current Functional Status.   Therapy/Group: Individual Therapy  Kerney ElbeVarner, Dheeraj Hail A 03/06/2015, 11:19 AM

## 2015-03-06 NOTE — Progress Notes (Signed)
Speech Language Pathology Daily Session Note  Patient Details  Name: Juan [name] MRN: [id number] Date of Birth: 12/24/1990  Today's Date: 03/06/2015 SLP Individual Time: 1127-1201 SLP Individual Time Calculation (min): 34 min  Short Term Goals: Week 4: SLP Short Term Goal 1 (Week 4): Patient will sustain attention to a task for 5 minutes with Max A multimodal cues.  SLP Short Term Goal 2 (Week 4): Patient will initiate functional tasks in 90% of opportunities with Mod A verbal cues.  SLP Short Term Goal 3 (Week 4): Patient will vocalize with Max A multimodal cues in 25% of opportunities  SLP Short Term Goal 4 (Week 4): Patient will express basic wants/needs in 90% of opportunities with Mod A multimodal cues.  SLP Short Term Goal 5 (Week 4): Patient will dys 2 textures and thin liquids with minimal overt s/s of aspiration with Mod A verbal cues for use of swallowing compensatory strategies.   Skilled Therapeutic Interventions: Skilled treatment focused on cognitive and communicative goals. Pt was transferred OOB to w/c via maximove with Mod cues for initiation during bed mobility. Pt wanted to listen to music and verbalized his artist and song preferences with Min A from therapist. Voice remains aphonic. Pt completed three-category sorting task with Mod cues for redirection and Mod-Max cues for accuracy.    Function:  Eating Eating                 Cognition Comprehension Comprehension assist level: Understands basic 50 - 74% of the time/ requires cueing 25 - 49% of the time  Expression   Expression assist level: Expresses basic 25 - 49% of the time/requires cueing 50 - 75% of the time. Uses single words/gestures.  Social Interaction Social Interaction assist level: Interacts appropriately 50 - 74% of the time - May be physically or verbally inappropriate.  Problem Solving Problem solving assist level: Solves basic less than 25% of the time - needs direction nearly all the time  or does not effectively solve problems and may need a restraint for safety  Memory Memory assist level: Recognizes or recalls 25 - 49% of the time/requires cueing 50 - 75% of the time    Pain Pain Assessment Pain Assessment: No/denies pain Pain Score: 0-No pain PAINAD (Pain Assessment in Advanced Dementia) Facial Expression: smiling or inexpressive  Therapy/Group: Individual Therapy   Maxcine HamLaura Paiewonsky, M.A. CCC-SLP [phone]  Maxcine Hamaiewonsky, Juan [name] 03/06/2015, 2:27 PM

## 2015-03-07 ENCOUNTER — Inpatient Hospital Stay (HOSPITAL_COMMUNITY): Payer: 59

## 2015-03-07 ENCOUNTER — Inpatient Hospital Stay (HOSPITAL_COMMUNITY): Payer: 59 | Admitting: Speech Pathology

## 2015-03-07 ENCOUNTER — Inpatient Hospital Stay (HOSPITAL_COMMUNITY): Payer: 59 | Admitting: Physical Therapy

## 2015-03-07 LAB — CBC WITH DIFFERENTIAL/PLATELET
Basophils Absolute: 0 10*3/uL (ref 0.0–0.1)
Basophils Relative: 0 %
Eosinophils Absolute: 0.1 10*3/uL (ref 0.0–0.7)
Eosinophils Relative: 2 %
HCT: 36.4 % — ABNORMAL LOW (ref 39.0–52.0)
Hemoglobin: 12.3 g/dL — ABNORMAL LOW (ref 13.0–17.0)
Lymphocytes Relative: 40 %
Lymphs Abs: 1.8 10*3/uL (ref 0.7–4.0)
MCH: 29.5 pg (ref 26.0–34.0)
MCHC: 33.8 g/dL (ref 30.0–36.0)
MCV: 87.3 fL (ref 78.0–100.0)
Monocytes Absolute: 0.4 10*3/uL (ref 0.1–1.0)
Monocytes Relative: 9 %
Neutro Abs: 2.2 10*3/uL (ref 1.7–7.7)
Neutrophils Relative %: 49 %
Platelets: 213 10*3/uL (ref 150–400)
RBC: 4.17 MIL/uL — ABNORMAL LOW (ref 4.22–5.81)
RDW: 13.5 % (ref 11.5–15.5)
WBC: 4.4 10*3/uL (ref 4.0–10.5)

## 2015-03-07 LAB — BASIC METABOLIC PANEL
Anion gap: 10 (ref 5–15)
BUN: 10 mg/dL (ref 6–20)
CO2: 29 mmol/L (ref 22–32)
Calcium: 10.4 mg/dL — ABNORMAL HIGH (ref 8.9–10.3)
Chloride: 97 mmol/L — ABNORMAL LOW (ref 101–111)
Creatinine, Ser: 0.69 mg/dL (ref 0.61–1.24)
GFR calc Af Amer: >60 mL/min (ref >60)
GFR calc non Af Amer: >60 mL/min (ref >60)
Glucose, Bld: 98 mg/dL (ref 65–99)
Potassium: 3.8 mmol/L (ref 3.5–5.1)
Sodium: 136 mmol/L (ref 135–145)

## 2015-03-07 MED ORDER — LEVETIRACETAM 750 MG PO TABS
750.0000 mg | ORAL_TABLET | Freq: Two times a day (BID) | ORAL | Status: DC
  Administered 2015-03-07 – 2015-03-12 (×10): 750 mg via ORAL
  Filled 2015-03-07 (×10): qty 1

## 2015-03-07 NOTE — Progress Notes (Signed)
Physical Therapy Session Note  Patient Details  Name: Juan PilotJacob S XXXHunt MRN: [id number] Date of Birth: 12/11/1990  Today's Date: 03/07/2015 PT Individual Time: 1100-1200 PT Individual Time Calculation (min): 60 min   Short Term Goals: Week 3:  PT Short Term Goal 1 (Week 3): Patient will propel wheelchair using BUE in straight path x 150 ft with supervision and max cues.  PT Short Term Goal 1 - Progress (Week 3): Progressing toward goal PT Short Term Goal 2 (Week 3): Patient will maintain sitting balance x 15 min with consistent min A.  PT Short Term Goal 3 (Week 3): Patient will sustain attention to functional tasks x 5 min with max cues.  PT Short Term Goal 4 (Week 3): Patient will perform bed mobility with consistent mod A.   Skilled Therapeutic Interventions/Progress Updates:   Session focused on sitting balance, functional use/strengthening BUE, trunk activation, transfer training, and following commands. Patient declined use of slide board and transferred wheelchair > mat table with good use of BUE to scoot laterally but self elected to end up on mat in sidelying. Patient able to transfer sidelying > prone > sidelying <> sit on mat table with supervision-min A. Patient maintained sitting balance on mat table x 30 min with close supervision overall and min A to reposition while using BUE to punch boxing bag with excellent effort, accuracy, and motivation. Patient appeared to favor use of stronger RUE over weaker LUE. To increase challenge, patient instructed to hit single letters of alphabet progressed to sequence of letters with decreased accuracy and perseveration on letters. Patient again declined use of slide board to transfer back to wheelchair at end of session but was unable to safely complete transfer, therefore utilized slide board with min A and assist to place board to transfer back to wheelchair. Patient left sitting in TIS wheelchair with mother to return to room.   Therapy  Documentation Precautions:  Precautions Precautions: Fall Precaution Comments: BLE NWB; trach, peg Required Braces or Orthoses: Other Brace/Splint Other Brace/Splint: B resting hand splints; B PRAFO Restrictions Weight Bearing Restrictions: Yes RLE Weight Bearing: Non weight bearing LLE Weight Bearing: Non weight bearing Pain: Pain Assessment Pain Assessment: No/denies pain  See Function Navigator for Current Functional Status.   Therapy/Group: Individual Therapy  Kerney ElbeVarner, Pablo Mathurin A 03/07/2015, 12:36 PM

## 2015-03-07 NOTE — Progress Notes (Addendum)
Juan [name]     PROGRESS NOTE    Subjective/Complaints:  Pt seen and examined this AM working with therapies.  Pt brushing his teeth and consistently following 1 step commands.    ROS --limited due to cognitive status. Appears to deny leg pain, foot pain  Objective: Vital Signs: Blood pressure 136/84, pulse 92, temperature 98.4 F (36.9 C), temperature source Oral, resp. rate 19, height  (1.676 m), weight 68 kg (149 lb 14.6 oz), SpO2 100 %. Dg Tibia/fibula Left  03/05/2015  CLINICAL DATA:  Comminuted left tibial and fibular fractures. Subsequent encounter. EXAM: LEFT TIBIA AND FIBULA - 2 VIEW COMPARISON:  February 07, 2015. FINDINGS: Status post intra medullary rod fixation of comminuted fracture involving the proximal left tibial shaft. Increased callus formation is noted suggesting healing fracture, although persistent fracture line is noted. Increased callus formation also seen involving displaced mid fibular fracture consistent with healing fracture. IMPRESSION: Increased callus formation is seen involving fibular fracture consistent with healing. Mildly increased amount of callus formation is seen involving the tibial fracture, although significant lucency remains. Status post intra medullary rod fixation of proximal tibial fracture. Electronically Signed   By: Lupita Raider, M.D.   On: 03/05/2015 15:32    Recent Labs  03/07/15 0604  WBC 4.4  HGB 12.3*  HCT 36.4*  PLT 213    Recent Labs  03/07/15 0604  NA 136  K 3.8  CL 97*  GLUCOSE 98  BUN 10  CREATININE 0.69  CALCIUM 10.4*   CBG (last 3)  No results for input(s): GLUCAP in the last 72 hours.  Wt Readings from Last 3 Encounters:  03/07/15 68 kg (149 lb 14.6 oz)  02/12/15 73.936 kg (163 lb)  07/31/12 63.504 kg (140 lb)    Physical Exam:  Constitutional: He appears well-developed and well-nourished.  Alert.  HENT:  Head: Normocephalic and atraumatic.  Right Ear:  External ear normal.  Left Ear: External ear normal.  Eyes: Right eye exhibits no discharge. Left eye exhibits no discharge. No scleral icterus. EOMI.  Neck: Normal range of motion. Neck supple. No JVD present. No tracheal deviation present. No thyromegaly present. Hypergranulation over closed stoma--associated drainage Cardiovascular: Normal rate and regular rhythm.  Respiratory: Effort normal and breath sounds normal. No respiratory distress. He has no wheezes. no rales GI: Soft. Bowel sounds are normal. He exhibits no distension. There is no tenderness. There is no rebound and no guarding.  PEG tube (foley) in place--skin looks clean/dry.   Musculoskeletal: He exhibits no edema. No pain with range of motion of the knee or ankle, moderate tenderness with palpation of the tibia on the left side, no knee joint swelling, no ankle joint swelling Neurological: speaks, usually in a whisper, occasionally phonates partially.  Moves all extremities.  Still some primitive posturing with flexion in the upper and lower limbs which can be broken with verbal and tactile cues, left more than right.. No hypersensitivity to touch in both lower extremities Consistently follows 1 step commands.  Skin:  Numerous surgical scars on both LE's. All wounds essentially healed in both legs.  Neuro: follows one step commands more quickly. Sitting posture and head position improving.  Psychiatric:  Flat affect, ?depressed  Assessment/Plan: 1. 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 Physiatrist is providing close team supervision and 24 hour management of active medical problems listed below. Physiatrist and  rehab team continue to assess barriers to discharge/monitor patient progress toward functional and medical goals.  Function:  Bathing Bathing position Bathing activity did not occur: N/A (Night  Bath) Position: Bed  Bathing parts Body parts bathed by patient: Chest, Front perineal area Body parts bathed by helper: Right arm, Left arm, Abdomen, Buttocks, Right upper leg, Left upper leg, Right lower leg, Left lower leg, Back  Bathing assist Assist Level: Touching or steadying assistance(Pt > 75%)      Upper Body Dressing/Undressing Upper body dressing   What is the patient wearing?: Pull over shirt/dress     Pull over shirt/dress - Perfomed by patient: Thread/unthread right sleeve, Thread/unthread left sleeve, Put head through opening, Pull shirt over trunk Pull over shirt/dress - Perfomed by helper: Thread/unthread left sleeve, Put head through opening, Pull shirt over trunk        Upper body assist Assist Level: Set up, Touching or steadying assistance(Pt > 75%)   Set up : To obtain clothing/put away  Lower Body Dressing/Undressing Lower body dressing   What is the patient wearing?: Pants, Non-skid slipper socks   Underwear - Performed by helper: Thread/unthread right underwear leg, Thread/unthread left underwear leg, Pull underwear up/down   Pants- Performed by helper: Thread/unthread right pants leg, Thread/unthread left pants leg, Pull pants up/down   Non-skid slipper socks- Performed by helper: Don/doff right sock, Don/doff left sock   Socks - Performed by helper: Don/doff right sock, Don/doff left sock              Lower body assist Assist for lower body dressing: Touching or steadying assistance (Pt > 75%)      Toileting Toileting Toileting activity did not occur: No continent bowel/bladder event   Toileting steps completed by helper: Adjust clothing prior to toileting, Performs perineal hygiene, Adjust clothing after toileting    Toileting assist Assist level: Two helpers   Transfers Chair/bed transfer   Chair/bed transfer method: Lateral scoot Chair/bed transfer assist level: 2 helpers Chair/bed transfer assistive device: Armrests, Sliding  board Mechanical lift: Maximove   Locomotion Ambulation Ambulation activity did not occur: Safety/medical concerns (NWB bilateral lower extremities)         Wheelchair Wheelchair activity did not occur: Safety/medical concerns (low level TBI) Type: Manual Max wheelchair distance: 150 ft Assist Level: Touching or steadying assistance (Pt > 75%)  Cognition Comprehension Comprehension assist level: Understands basic 50 - 74% of the time/ requires cueing 25 - 49% of the time  Expression Expression assist level: Expresses basic 25 - 49% of the time/requires cueing 50 - 75% of the time. Uses single words/gestures.  Social Interaction Social Interaction assist level: Interacts appropriately 50 - 74% of the time - May be physically or verbally inappropriate.  Problem Solving Problem solving assist level: Solves basic less than 25% of the time - needs direction nearly all the time or does not effectively solve problems and may need a restraint for safety  Memory Memory assist level: Recognizes or recalls 50 - 74% of the time/requires cueing 25 - 49% of the time   Medical Problem List and Plan: 1. 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 2. DVT Prophylaxis/Anticoagulation: Subcutaneous Lovenox as well as IVC filter 01/12/2015---still indicated 3. Pain Management: have weaned off a lot of pain meds.    -increasing pain complaints likely due more awareness and coming off  fentanyl patch.   - continue tramadol 100 mg every 8 hours monitor for sedation, started low-dose MS Contin 15 mg morning to help with daytime pain 4. Mood/behavior/cognition: ritalin  to improve attention/initiation further.    -propranolol weaned.  Weaning klonopin due to neurosedating effects.  -sleep improving   -seroquel -- decreased to       -continue sleep chart  -family at bedside for safety reasons---otherwise this patient would require some sort of restraintg 5. Neuropsych: This patient is not capable of making decisions on his own behalf. 6. Skin/Wound Care: Routine skin checks continue 7. Fluids/Electrolytes/Nutrition:   -K+ increased due to hemolysis. The remainders of labs normal  - Labs reviewed, stable 8. Uncontrollable rhythmic movements/spasticity. Improved. Likely related to distractibility, also on baclofen  - Continue Keppra and Vimpat for now--change to pill/oral form. Will start tapering Keppra to 750 BID today.   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 Dysphagia: now on D1/thins  -Gastrostomy tube 01/18/2015   -off bolus feeds now, needs to push po  -dc supps and h20 boluses  -continue foley cath for now in place of g-tube which was accidentally pulled out--can remove this week  -all meds changed to oral form   11. Large tension pneumothorax on the right with multiple rib fractures/pulmonary  -?LLL pneumonia---CXR resolved.  -aspiration precautions  -trach stoma closing---apply silver nitrate to hypergranulation area 12. Multiple orthopedic fractures, L1-2 transverse process fractures,/left femur fracture with IM nailing, left and right open tibia fibular fractures, transverse acetabulum fracture status post percutaneous pinning,CM pelvic ring status post ORIF, pubic symphysis and trans-sacral screw forL S I diastasis and right sacralalo fracture. Nonweightbearing bilateral lower extremities 8 weeks until 03/17/2015 Increased pain left tibial area, follow-up x-ray personally reviewed, which shows healing of fibula >tibia.  Ortho to come by today to evaluate. 13. Urine retention/neurogenic bladder: flomax trial, urecholine trial  -spontaneous voiding improved. pvr's slowly decreasing, no I/O cath since yesterday  -timed  voiding to continue to improve continence   LOS (Days) 22 A FACE TO FACE EVALUATION WAS PERFORMED  Sahvanna Mcmanigal Karis Juba 03/07/2015 10:42 AM

## 2015-03-07 NOTE — Progress Notes (Signed)
Occupational Therapy Note  Patient Details  Name: Juan [name] MRN: [id number] Date of Birth: 06/14/1990  Today's Date: 03/07/2015 OT Individual Time: 1400-1430 OT Individual Time Calculation (min): 30 min   Pt denied pain Individual Therapy  Pt engaged in [facility] and hand/digit manipulation tasks to increased ability to self feed and open containers.  Pt noted with increased AROM and use of BUE but with continued difficulty with in-hand manipulation tasks.  Pt continues to perseverate of motor tasks and requires a break from task to interrupt the cycle.     [name], [name] [facility] 03/07/2015, 2:52 PM

## 2015-03-07 NOTE — Progress Notes (Signed)
Nutrition Follow-up  DOCUMENTATION CODES:   Not applicable  INTERVENTION:  Provide Magic cup between meals, each supplement provides 290 kcal and 9 grams of protein.  Continue free water flushes.   Encourage adequate PO intake.   NUTRITION DIAGNOSIS:   Increased nutrient needs related to wound healing as evidenced by estimated needs; ongoing  GOAL:   Patient will meet greater than or equal to 90% of their needs; progressing  MONITOR:   PO intake, Supplement acceptance, Diet advancement, Weight trends, Labs, I & O's  REASON FOR ASSESSMENT:   Consult Enteral/tube feeding initiation and management  ASSESSMENT:   24 [facility]/p [facility] with multiple right rib fxs, bilateral pulmonary contusions, colon mesentery injury better on CT, L1,2 TVP fxs, right sup/inf pubic rami fxs, bilateral acetabular fxs, right sacral ala fx s/p ORIF, left femur fx s/p ORIF, and bilateral open tib/fib fxs s/p ORIF. 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   Meal completion has been varied from 25-75%. Diet has been advanced to a dysphagia 2 diet. Supplements has been discontinued. Per MD note, PEG may be removed this week. Provide Magic Cup between meals to aid in adequate caloric and protein intake.   Labs and medications reviewed.   Diet Order:  DIET DYS 2 Room service appropriate?: Yes; Fluid consistency:: Thin  Skin:   (Incision on abdomen and neck)  Last BM:  10/18  Height:   Ht Readings from Last 1 Encounters:  02/13/15 5\' 6"  (1.676 m)    Weight:   Wt Readings from Last 1 Encounters:  03/07/15 149 lb 14.6 oz (68 kg)    Ideal Body Weight:  64.5 kg  BMI:  Body mass index is 24.21 kg/(m^2).  Estimated Nutritional Needs:   Kcal:  2300-2500  Protein:  135-145 grams  Fluid:  2.3-2.5 L  EDUCATION NEEDS:   No education needs identified at this time  Roslyn SmilingStephanie Lamonica Trueba, MS, RD, LDN Pager # [phone] After hours/ weekend pager #  [phone]

## 2015-03-07 NOTE — Progress Notes (Signed)
Speech Language Pathology Daily Session Note  Patient Details  Name: Juan [name] MRN: [id number] Date of Birth: 07/27/1990  Today's Date: 03/07/2015 SLP Individual Time: [id number] SLP Individual Time Calculation (min): 58 min  Short Term Goals: Week 4: SLP Short Term Goal 1 (Week 4): Patient will sustain attention to a task for 5 minutes with Max A multimodal cues.  SLP Short Term Goal 2 (Week 4): Patient will initiate functional tasks in 90% of opportunities with Mod A verbal cues.  SLP Short Term Goal 3 (Week 4): Patient will vocalize with Max A multimodal cues in 25% of opportunities  SLP Short Term Goal 4 (Week 4): Patient will express basic wants/needs in 90% of opportunities with Mod A multimodal cues.  SLP Short Term Goal 5 (Week 4): Patient will dys 2 textures and thin liquids with minimal overt s/s of aspiration with Mod A verbal cues for use of swallowing compensatory strategies.   Skilled Therapeutic Interventions: Skilled treatment focused on addressing dysphagia and cognitive-linguistic goals. SLP provided set up assist and close supervision during consumption of Dys.2 textures and thin liquids via straw which patient consumed with no overt s/s of aspiration despite moderate pace of self-feeding.  SLP also facilitated session with colored pictures that patient required Max assist multimodal cues to group into categories and Max multimodal cues to identify category of objects grouped by SLP.  Patient's fatigue, recent administration of pain medicine and attention to task appeared to impact today's function; patient required Max multimodal cues for ~30 seconds of sustained attention today.  Continue with current plan of care.    Function:  Eating Eating Eating activity did not occur: Safety/medical concerns Modified Consistency Diet: Yes Eating Assist Level: Supervision or verbal cues;Set up assist for;Help with picking up utensils   Eating Set Up Assist For: Opening  containers       Cognition Comprehension Comprehension assist level: Understands basic 50 - 74% of the time/ requires cueing 25 - 49% of the time  Expression Expression assistive device: Talk trach valve Expression assist level: Expresses basic 25 - 49% of the time/requires cueing 50 - 75% of the time. Uses single words/gestures.  Social Interaction Social Interaction assist level: Interacts appropriately 25 - 49% of time - Needs frequent redirection.  Problem Solving Problem solving assist level: Solves basic less than 25% of the time - needs direction nearly all the time or does not effectively solve problems and may need a restraint for safety  Memory Memory assist level: Recognizes or recalls 25 - 49% of the time/requires cueing 50 - 75% of the time    Pain Pain Assessment Pain Assessment: No/denies pain  Therapy/Group: Individual Therapy  Charlane FerrettiMelissa Quinci Gavidia, M.A., CCC-SLP [phone]  [name] 03/07/2015, 3:55 PM

## 2015-03-07 NOTE — Progress Notes (Signed)
Occupational Therapy Session Note  Patient Details  Name: Juan [name] MRN: [id number] Date of Birth: 04/28/1991  Today's Date: 03/07/2015 OT Individual Time: 0800-0900 OT Individual Time Calculation (min): 60 min    Short Term Goals: Week 3:  OT Short Term Goal 1 (Week 3): Pt will maintain dynamic sitting balance EOB during dressing tasks with min A for 15 mins. OT Short Term Goal 2 (Week 3): Pt will perform UB dressing tasks with mod A OT Short Term Goal 3 (Week 3): Pt will bathe UB with min A while seated in supported position with min verbal cues for initiation OT Short Term Goal 4 (Week 3): Pt will exhibit sustained attention during ADLs for 3 min with min verbal cues  Skilled Therapeutic Interventions/Progress Updates:    Pt engaged in LB dressing tasks at bed level with focus on rolling side to side, threading BLE into pants, and assisting with pulling pants up.  Pt required max multimodal cues to initiate rolling and threading BLE.  Pt was able to perform supine->sit EOB with steady A this morning and remained in unsupported sitting position with steady A to don pullover shirt.  Pt performed sliding board transfer with max A this morning.  Pt initiating use of BUE to assist with transfer.  Pt completed grooming tasks at sink with mod verbal cues for task initiation with washing face and brushing teeth.  Pt transitioned to day room and [facility] in [facility] with focus on inhand manipulation and breaking perseverative patterns to increase independence with self feeding.  Therapy Documentation Precautions:  Precautions Precautions: Fall Precaution Comments: BLE NWB; trach, peg Required Braces or Orthoses: Other Brace/Splint Other Brace/Splint: B resting hand splints; B PRAFO Restrictions Weight Bearing Restrictions: Yes RLE Weight Bearing: Non weight bearing LLE Weight Bearing: Non weight bearing Pain:  Pt with no s/s pain ADL: ADL ADL Comments: see FIM  See Function Navigator  for Current Functional Status.   Therapy/Group: Individual Therapy  Rich BraveLanier, Coleman Kalas Chappell 03/07/2015, 10:03 AM

## 2015-03-08 ENCOUNTER — Inpatient Hospital Stay (HOSPITAL_COMMUNITY): Payer: 59 | Admitting: Speech Pathology

## 2015-03-08 ENCOUNTER — Inpatient Hospital Stay (HOSPITAL_COMMUNITY): Payer: 59

## 2015-03-08 ENCOUNTER — Inpatient Hospital Stay (HOSPITAL_COMMUNITY): Payer: 59 | Admitting: Physical Therapy

## 2015-03-08 ENCOUNTER — Inpatient Hospital Stay (HOSPITAL_COMMUNITY): Payer: 59 | Admitting: *Deleted

## 2015-03-08 ENCOUNTER — Telehealth: Payer: Self-pay | Admitting: Radiology

## 2015-03-08 MED ORDER — BISACODYL 10 MG RE SUPP
10.0000 mg | Freq: Once | RECTAL | Status: AC
  Administered 2015-03-08: 10 mg via RECTAL
  Filled 2015-03-08: qty 1

## 2015-03-08 MED ORDER — FLEET ENEMA 7-19 GM/118ML RE ENEM
1.0000 | ENEMA | Freq: Once | RECTAL | Status: DC
Start: 2015-03-08 — End: 2015-03-20

## 2015-03-08 NOTE — Progress Notes (Signed)
Physical Therapy Weekly Progress Note  Patient Details  Name: Juan [name] MRN: [id number] Date of Birth: [birth year]  Beginning of progress report period: March 02, 2015 End of progress report period: March 08, 2015  Today's Date: 03/08/2015 PT Individual Time: 1100-1200 PT Individual Time Calculation (min): 60 min   Patient has met 3 of 4 short term goals.  Patient continues to make excellent progress toward functional goals with skilled intervention. He is now able to perform slide board transfers with min A overall and max multimodal cues for sequencing, propel wheelchair using BUE with min A overall, and perform bed mobility with supervision-min A. Patient is demonstrating improved balance strategies, improved midline orientation, and improved functional use of BUE. Family training ongoing, continue plan of care.   Patient continues to demonstrate the following deficits: muscle weakness, muscle joint tightness, decreased endurance, impaired timing and sequencing, abnormal tone, unbalanced muscle activation, motor apraxia, ataxia, decreased coordination, decreased motor planning, decreased midline orientation, decreased initiation, decreased attention, decreased awareness, decreased problem solving, decreased safety awareness, decreased memory, delayed processing, decreased sitting balance, decreased postural control, decreased balance strategies and difficulty maintaining precautions and therefore will continue to benefit from skilled PT intervention to enhance overall performance with activity tolerance, balance, postural control, ability to compensate for deficits, functional use of  right upper extremity and left upper extremity, attention, awareness, coordination and knowledge of precautions.  Patient progressing toward long term goals.  Plan of care revisions: goals upgraded to supervision-min A overall.   PT Short Term Goals Week 3:  PT Short Term Goal 1 (Week 3): Patient will  propel wheelchair using BUE in straight path x 150 ft with supervision and max cues.  PT Short Term Goal 1 - Progress (Week 3): Progressing toward goal PT Short Term Goal 2 (Week 3): Patient will maintain sitting balance x 15 min with consistent min A.  PT Short Term Goal 2 - Progress (Week 3): Met PT Short Term Goal 3 (Week 3): Patient will sustain attention to functional tasks x 5 min with max cues.  PT Short Term Goal 3 - Progress (Week 3): Met PT Short Term Goal 4 (Week 3): Patient will perform bed mobility with consistent mod A.  PT Short Term Goal 4 - Progress (Week 3): Met Week 4:  PT Short Term Goal 1 (Week 4): Patient will perform slide board transfers with min A and 75% cues for sequencing and technique.  PT Short Term Goal 2 (Week 4): Patient will propel wheelchair using BUE in straight path x 50 ft with supervision and max cues.  PT Short Term Goal 3 (Week 4): Patient will sustain attention to functional tasks x 15 min with min cues.  Skilled Therapeutic Interventions/Progress Updates:   Patient up in [place] with mother present. Patient performed UBE from wheelchair x 8 min with min cues for attention to task at level 5.0-7.0 with hand over hand assist x 1 to place RUE on handle. Performed slide board transfers wheelchair <> mat table with assist for placement of board and patient able to use BUE to scoot across board with max multimodal cues for sequencing and technique, min A overall with increased time and patient's feet on top of therapist's foot to maintain precautions. Patient maintained sitting balance on mat table with supervision x 15 min with engaging in reaching task for horseshoes > tossing horseshoes. Patient utilized LUE 15% of available opportunities on command and declined use of LUE remainder of time despite cues. Patient given  supine cognitive rest break due to decreased frustration tolerance with given tasks. Patient encouraged to sit edge of bed to participate in trunk  activation/reaching/balance task, using BUE to roll large physioball forward and backward. Patient required more than reasonable amount of time to sit edge of bed with feet over edge as patient prefers long sit or circle sit position on mat table and max multimodal cues to complete task as instructed. Patient transferred back to wheelchair and left in room with mother present.   Therapy Documentation Precautions:  Precautions Precautions: Fall Precaution Comments: BLE NWB; trach, peg Required Braces or Orthoses: Other Brace/Splint Other Brace/Splint: B resting hand splints; B PRAFO Restrictions Weight Bearing Restrictions: Yes RLE Weight Bearing: Non weight bearing LLE Weight Bearing: Non weight bearing Pain: Pain Assessment Pain Assessment: No/denies pain   See Function Navigator for Current Functional Status.  Therapy/Group: Individual Therapy  Laretta Alstrom 03/08/2015, 12:23 PM

## 2015-03-08 NOTE — Telephone Encounter (Signed)
Call for status update.  No answer.  Will attempt to contact patient later.  Markice Torbert Carmell AustriaGales, RN 03/08/2015 9:52 AM

## 2015-03-08 NOTE — Progress Notes (Signed)
Occupational Therapy Note  Patient Details  Name: [name] MRN: [id number] Date of Birth: 08/14/1990  Today's Date: 03/08/2015 OT Individual Time: 1400-1445 OT Individual Time Calculation (min): 45 min   Pt denied pain Individual therapy  Pt sitting upright in bed with grandmother present upon arrival.  Pt's grandmother stated that pt was eager to get OOB.  OT attempted to assist patient with coming to EOB and sitting in preparation for transfer.  Pt emphatically stated "I can do in myself." OT allowed pt to problem solve and attempt to complete task.  Pt continues to exhibit apraxia with functional tasks, primarily noted with control of LLE during this task.  Pt became frustrated at one point and pounded his fist into the bed.  Pt allowed therapist to assist with bringing LLE over edge of bed.  Pt assisted with BUE during sliding board transfer to w/c.  This task took approx 20 mins but patient was marginally pleased with being able to complete task with minimal assistance. Pt became tearful after seated in w/c and stated his frustration with not being able to complete tasks without assistance. Continued discharge planning with grandmother at end of session.   [name], Nickayla Mcinnis [facility] 03/08/2015, 2:56 PM

## 2015-03-08 NOTE — Progress Notes (Addendum)
[place] PHYSICAL MEDICINE & REHABILITATION     PROGRESS NOTE    Subjective/Complaints:  Pt seen and examined this AM laying in bed.  Pt's mother at bedside, who states pt slept well overnight and is pleased with his progress in therapies.  She has questions regarding the removal of his PEG.    ROS --limited due to cognitive status. Appears to deny leg pain, foot pain  Objective: Vital Signs: Blood pressure 130/80, pulse 69, temperature 98.4 F (36.9 C), temperature source Oral, resp. rate 16, height  (1.676 m), weight 66.1 kg (145 lb 11.6 oz), SpO2 99 %. No results found.  Recent Labs  03/07/15 0604  WBC 4.4  HGB 12.3*  HCT 36.4*  PLT 213    Recent Labs  03/07/15 0604  NA 136  K 3.8  CL 97*  GLUCOSE 98  BUN 10  CREATININE 0.69  CALCIUM 10.4*   CBG (last 3)  No results for input(s): GLUCAP in the last 72 hours.  Wt Readings from Last 3 Encounters:  03/08/15 66.1 kg (145 lb 11.6 oz)  02/12/15 73.936 kg (163 lb)  07/31/12 63.504 kg (140 lb)    Physical Exam:  Constitutional: He appears well-developed and well-nourished.  Alert.  HENT:  Head: Normocephalic and atraumatic.  Right Ear: External ear normal.  Left Ear: External ear normal.  Eyes: Right eye exhibits no discharge. Left eye exhibits no discharge. No scleral icterus. EOMI.  Neck: Normal range of motion. Neck supple. No JVD present. No tracheal deviation present. No thyromegaly present. Hypergranulation over closed stoma--associated drainage Cardiovascular: Normal rate and regular rhythm.  Respiratory: Effort normal and breath sounds normal. No respiratory distress. He has no wheezes. no rales GI: Soft. Bowel sounds are normal. He exhibits no distension. There is no tenderness. There is no rebound and no guarding.  PEG tube (foley) in place--skin looks clean/dry.   Musculoskeletal: He exhibits no edema. No pain with range of motion of the knee or ankle, moderate tenderness with palpation of the  tibia on the left side, no knee joint swelling, no ankle joint swelling Neurological: speaks, usually in a whisper, occasionally phonates partially.  Moves all extremities.  Still some primitive posturing with flexion in the upper and lower limbs which can be broken with verbal and tactile cues, left more than right.. No hypersensitivity to touch in both lower extremities Skin:  Numerous surgical scars on both LE's. All wounds essentially healed in both legs.  Neuro: follows one step commands more quickly. Sitting posture and head position improving.  Psychiatric:  Flat affect, ?depressed  Assessment/Plan: 1. 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 Physiatrist is providing close team supervision and 24 hour management of active medical problems listed below. Physiatrist and rehab team continue to assess barriers to discharge/monitor patient progress toward functional and medical goals.  Function:  Bathing Bathing position Bathing activity did not occur: N/A (Night Bath) Position: Shower  Bathing parts Body parts bathed by patient: Chest, Front perineal area, Abdomen, Right upper leg, Left upper leg Body parts bathed by helper: Right arm, Left arm, Buttocks, Right lower leg, Left lower leg, Back  Bathing assist Assist Level: Touching or steadying assistance(Pt > 75%)      Upper Body Dressing/Undressing Upper body dressing   What is the patient wearing?: Pull over shirt/dress     Pull over shirt/dress - Perfomed by patient: Thread/unthread right sleeve, Thread/unthread  left sleeve, Put head through opening, Pull shirt over trunk Pull over shirt/dress - Perfomed by helper: Thread/unthread left sleeve, Put head through opening, Pull shirt over trunk        Upper body assist Assist Level: Set up, Touching or steadying assistance(Pt > 75%)   Set up : To obtain  clothing/put away  Lower Body Dressing/Undressing Lower body dressing   What is the patient wearing?: Underwear, Pants, Non-skid slipper socks   Underwear - Performed by helper: Thread/unthread right underwear leg, Thread/unthread left underwear leg, Pull underwear up/down   Pants- Performed by helper: Thread/unthread right pants leg, Thread/unthread left pants leg, Pull pants up/down   Non-skid slipper socks- Performed by helper: Don/doff right sock, Don/doff left sock   Socks - Performed by helper: Don/doff right sock, Don/doff left sock              Lower body assist Assist for lower body dressing: Touching or steadying assistance (Pt > 75%)      Toileting Toileting Toileting activity did not occur: No continent bowel/bladder event   Toileting steps completed by helper: Adjust clothing prior to toileting, Performs perineal hygiene, Adjust clothing after toileting    Toileting assist Assist level: Two helpers   Transfers Chair/bed transfer   Chair/bed transfer method: Lateral scoot Chair/bed transfer assist level: Touching or steadying assistance (Pt > 75%) Chair/bed transfer assistive device: Armrests, Sliding board Mechanical lift: Maximove   Locomotion Ambulation Ambulation activity did not occur: Safety/medical concerns (NWB bilateral lower extremities)         Wheelchair Wheelchair activity did not occur: Safety/medical concerns (low level TBI) Type: Manual Max wheelchair distance: 150 ft Assist Level: Touching or steadying assistance (Pt > 75%)  Cognition Comprehension Comprehension assist level: Understands basic 50 - 74% of the time/ requires cueing 25 - 49% of the time  Expression Expression assist level: Expresses basic 25 - 49% of the time/requires cueing 50 - 75% of the time. Uses single words/gestures.  Social Interaction Social Interaction assist level: Interacts appropriately 25 - 49% of time - Needs frequent redirection.  Problem Solving Problem  solving assist level: Solves basic less than 25% of the time - needs direction nearly all the time or does not effectively solve problems and may need a restraint for safety  Memory Memory assist level: Recognizes or recalls 25 - 49% of the time/requires cueing 50 - 75% of the time   Medical Problem List and Plan: 1. 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 2. DVT Prophylaxis/Anticoagulation: Subcutaneous Lovenox as well as IVC filter 01/12/2015---still indicated 3. Pain Management: have weaned off a lot of pain meds.    -increasing pain complaints likely due more awareness and coming off fentanyl patch.   - continue tramadol 100 mg every 8 hours monitor for sedation, started low-dose MS Contin 15 mg morning to help with daytime pain 4. Mood/behavior/cognition: ritalin 15mg  to improve attention/initiation further.    -propranolol weaned.  Weaning klonopin due to neurosedating effects.  -sleep improving   -seroquel 75mg -- decreased to 50mg      -continue sleep chart  -family at bedside for safety reasons---otherwise this patient would require some sort of restraintg 5. Neuropsych: This patient is not capable of making decisions on his own behalf. 6. Skin/Wound Care: Routine skin checks continue 7. Fluids/Electrolytes/Nutrition:   -K+ increased due to hemolysis. The remainders of  labs normal  - Recent labs stable 8. Uncontrollable rhythmic movements/spasticity. Improved. Likely related to distractibility, also on baclofen  - Continue Vimpat   -Started tapering Keppra to 750 BID on 10/19.   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 Dysphagia: now on D1/thins  -Gastrostomy tube 01/18/2015   -off bolus feeds now, needs to push po  -dc  supps and h20 boluses  -Will d/c foley tube today and cont to monitor  -all meds changed to oral form   11. Large tension pneumothorax on the right with multiple rib fractures/pulmonary  -?LLL pneumonia---CXR resolved.  -aspiration precautions  -trach stoma closing---apply silver nitrate to hypergranulation area 12. Multiple orthopedic fractures, L1-2 transverse process fractures,/left femur fracture with IM nailing, left and right open tibia fibular fractures, transverse acetabulum fracture status post percutaneous pinning,CM pelvic ring status post ORIF, pubic symphysis and trans-sacral screw forL S I diastasis and right sacralalo fracture. Nonweightbearing bilateral lower extremities 8 weeks until 03/17/2015 Increased pain left tibial area, follow-up x-ray personally reviewed, which shows healing of fibula >tibia.  Ortho to evaluate today. 13. Urine retention/neurogenic bladder: flomax trial, urecholine trial  -spontaneous voiding improved. pvr's slowly decreasing, no I/O cath since yesterday  -timed voiding to continue to improve continence   LOS (Days) 23 A FACE TO FACE EVALUATION WAS PERFORMED  Ankit Karis Juba 03/08/2015 2:53 PM

## 2015-03-08 NOTE — Progress Notes (Signed)
Occupational Therapy Session Note  Patient Details  Name: Juan [name] MRN: [id number] Date of Birth: 10/21/1990  Today's Date: 03/08/2015 OT Individual Time: 0700-0800 OT Individual Time Calculation (min): 60 min    Short Term Goals: Week 3:  OT Short Term Goal 1 (Week 3): Pt will maintain dynamic sitting balance EOB during dressing tasks with min A for 15 mins. OT Short Term Goal 2 (Week 3): Pt will perform UB dressing tasks with mod A OT Short Term Goal 3 (Week 3): Pt will bathe UB with min A while seated in supported position with min verbal cues for initiation OT Short Term Goal 4 (Week 3): Pt will exhibit sustained attention during ADLs for 3 min with min verbal cues  Skilled Therapeutic Interventions/Progress Updates:    Pt engaged in BADL retraining including bathing at shower level (rolling shower chair) and dressing at bed level and sitting upright in bed.  Pt initiated all bathing tasks with verbal cues for initiation and sequencing.  Pt required physical assist (hand over hand) to complete tasks.  Pt rolled from side to side in bed to facilitate pulling up pants.  Pt lifted BLE upon request to facilitate threading pants.  Pt sat upright in bed without assist and donned pullover shirt with out assistance when presented with shirt.  Pt remained in bed at end of session.  Mechanical lift utilized for all transfers for time efficiency.  Focus on task initiation, sequencing, attention to task, sitting balance, following one step commands, and safety awareness to increase independence and decrease burden of care.  Therapy Documentation Precautions:  Precautions Precautions: Fall Precaution Comments: BLE NWB; trach, peg Required Braces or Orthoses: Other Brace/Splint Other Brace/Splint: B resting hand splints; B PRAFO Restrictions Weight Bearing Restrictions: Yes RLE Weight Bearing: Non weight bearing LLE Weight Bearing: Non weight bearing Pain:  Pt denied pain  See Function  Navigator for Current Functional Status.   Therapy/Group: Individual Therapy  Rich BraveLanier, Marieli Rudy Chappell 03/08/2015, 8:04 AM

## 2015-03-08 NOTE — Progress Notes (Signed)
Patient last bowel movement 10/15, notified Pam Love, PA given order for dulcolax suppository. Patient had results after an hour of administration. Also removed PEG tube(foley) patient tolerated well, with no complaints of discomfort/pain. Will continue to monitor patient.[place]

## 2015-03-08 NOTE — Progress Notes (Signed)
Visited with Patient's mother to discussion [facility].  Family has been in house nearly a month.  Patient mother indicated that the doctor informed her  That the  patient  Maybe discharged Nov.1,2016.  [facility]  During my visit with the mother and I observed the  patient participating in his PT time and mother is is pleased with her son's progress. Chaplain provided empathetic listening, ministry of presence, emotional and spiritual support.  Will follow as needed.  Page [phone]

## 2015-03-08 NOTE — Progress Notes (Signed)
Given verbal order from Dan A., PA to remove PEG tube(foley) today. [place]

## 2015-03-08 NOTE — Progress Notes (Signed)
Speech Language Pathology Daily Session Note  Patient Details  Name: Juan [name] XXXHunt MRN: [id number] Date of Birth: 07/28/1990  Today'[name] Date: 03/08/2015 SLP Individual Time: 0930-1030 SLP Individual Time Calculation (min): 60 min  Short Term Goals: Week 4: SLP Short Term Goal 1 (Week 4): Patient will sustain attention to a task for 5 minutes with Max A multimodal cues.  SLP Short Term Goal 2 (Week 4): Patient will initiate functional tasks in 90% of opportunities with Mod A verbal cues.  SLP Short Term Goal 3 (Week 4): Patient will vocalize with Max A multimodal cues in 25% of opportunities  SLP Short Term Goal 4 (Week 4): Patient will express basic wants/needs in 90% of opportunities with Mod A multimodal cues.  SLP Short Term Goal 5 (Week 4): Patient will dys 2 textures and thin liquids with minimal overt [name]/[name] of aspiration with Mod A verbal cues for use of swallowing compensatory strategies.   Skilled Therapeutic Interventions: Skilled treatment session focused on addressing cognitive-linguistic goals. Patient was transferred out of bed to wheelchair via [facility] with Mod cues for accurate initiation of 1-step verbal directions during bed mobility. Patient verbally named pictured items with ~60% due to what appeared to be visual attention deficits and perseveration.  Patient was able to identify similar picture with Max faded to Mod question cues for accuracy.  SLP also facilitated session with a basic money sorting task that patient completed with Mod-Max cues for accuracy and Max assist multimodal cues for sustained attention to task for ~2 minute increments.  Function:  Cognition Comprehension Comprehension assist level: Understands basic 50 - 74% of the time/ requires cueing 25 - 49% of the time  Expression   Expression assist level: Expresses basic 25 - 49% of the time/requires cueing 50 - 75% of the time. Uses single words/gestures.  Social Interaction Social Interaction assist  level: Interacts appropriately 25 - 49% of time - Needs frequent redirection.  Problem Solving Problem solving assist level: Solves basic less than 25% of the time - needs direction nearly all the time or does not effectively solve problems and may need a restraint for safety  Memory Memory assist level: Recognizes or recalls 25 - 49% of the time/requires cueing 50 - 75% of the time    Pain Pain Assessment Pain Assessment: No/denies pain  Therapy/Group: Individual Therapy  Charlane FerrettiMelissa Kashara Blocher, M.A., CCC-SLP [phone]  Macalister Arnaud 03/08/2015, 4:08 PM

## 2015-03-09 ENCOUNTER — Inpatient Hospital Stay (HOSPITAL_COMMUNITY): Payer: Self-pay | Admitting: Physical Therapy

## 2015-03-09 ENCOUNTER — Inpatient Hospital Stay (HOSPITAL_COMMUNITY): Payer: 59

## 2015-03-09 ENCOUNTER — Inpatient Hospital Stay (HOSPITAL_COMMUNITY): Payer: 59 | Admitting: Speech Pathology

## 2015-03-09 MED ORDER — QUETIAPINE FUMARATE 25 MG PO TABS
25.0000 mg | ORAL_TABLET | Freq: Every day | ORAL | Status: DC
  Administered 2015-03-09 – 2015-03-13 (×5): 25 mg via ORAL
  Filled 2015-03-09 (×5): qty 1

## 2015-03-09 MED ORDER — CLONAZEPAM 0.5 MG PO TABS
0.5000 mg | ORAL_TABLET | Freq: Every day | ORAL | Status: DC
Start: 2015-03-10 — End: 2015-03-09

## 2015-03-09 MED ORDER — CLONAZEPAM 0.5 MG PO TABS
0.5000 mg | ORAL_TABLET | Freq: Two times a day (BID) | ORAL | Status: DC
  Administered 2015-03-09 – 2015-03-15 (×12): 0.5 mg via ORAL
  Filled 2015-03-09 (×12): qty 1

## 2015-03-09 NOTE — Plan of Care (Signed)
Problem: RH Balance Goal: LTG Patient will maintain dynamic standing with ADLs (OT) LTG: Patient will maintain dynamic standing balance with assist during activities of daily living (OT)  New goals added due to [facility] for [facility] LEs

## 2015-03-09 NOTE — Progress Notes (Signed)
[place] PHYSICAL MEDICINE & REHABILITATION     PROGRESS NOTE    Subjective/Complaints:  Pt seen this AM working with therapies.  His PEG/Foley was pulled yesterday.  Pt does not have any issues today.  He denies complaints.  He is sleepy.  Ortho evaluated pt and advanced to WBAT.      ROS --limited due to cognitive status. Appears to deny leg pain, foot pain  Objective: Vital Signs: Blood pressure 123/73, pulse 68, temperature 97.9 F (36.6 C), temperature source Oral, resp. rate 16, height 5\' 6"  (1.676 m), weight 63.8 kg (140 lb 10.5 oz), SpO2 100 %. Dg Tibia/fibula Right  03/08/2015  CLINICAL DATA:  Fracture.  Subsequent encounter. EXAM: RIGHT TIBIA AND FIBULA - 2 VIEW COMPARISON:  Radiographs 02/07/2015 FINDINGS: Intra medullary rod with proximal and distal locking screws traversing midshaft tibia fracture. The hardware is intact without periprosthetic lucency. There is been interval fracture healing with development of bridging callus laterally in non bridging callus posteriorly. The medial and anterior fracture lines remain readily visible. Fracture alignment is unchanged. Interval callus formation about the midshaft fibular fracture posterior, medial and laterally. Fibular fracture alignment is unchanged. Ghost tracks from prior external fixator are again seen. IMPRESSION: Post ORIF right tibial fracture with interval callus formation consistent with interval healing. No hardware complication. Interval callus formation about the midshaft fibular fracture. Alignment is unchanged. Electronically Signed   By: Rubye Oaks M.D.   On: 03/08/2015 18:40   Dg Pelvis Comp Min 3v  03/08/2015  CLINICAL DATA:  Follow up pelvic fractures post ORIF. Subsequent encounter. EXAM: JUDET PELVIS - 3+ VIEW COMPARISON:  Radiographs 02/07/2015 and 01/15/2015. FINDINGS: The surgical hardware appears unchanged. A screw traverses both sacroiliac joints. There has been plate and screw fixation of the symphysis  pubis, and there are 2 cannulated screws in the left acetabulum. Left femoral intramedullary nail is partially imaged. The pelvic fractures appear unchanged in alignment. There is still some displacement of the inferior pubic ramus and ischium on the right, although some evidence of progressive healing is noted. The comminuted fracture of the proximal left femur also demonstrates partial healing. IMPRESSION: 1. Intact hardware and stable alignment status post fixation of multiple pelvic fractures. 2. Progressive partial healing of the fractures. Electronically Signed   By: Carey Bullocks M.D.   On: 03/08/2015 18:38   Dg Femur Min 2 Views Left  03/08/2015  CLINICAL DATA:  History of multiple pelvic fractures with fixation EXAM: LEFT FEMUR 2 VIEWS COMPARISON:  02/07/2015, 01/15/2015 FINDINGS: There again multiple fixation screws traversing the left sacroiliac joint as well as the pelvis on the left. A fixation sideplate with multiple fixation screws traverses the pubic symphysis anteriorly. A medullary rod is noted within the femur. A proximal femoral shaft fracture line is again seen with callus formation although the union is incomplete. On the oblique image there appears to be widening at the fracture site although a similar view is new not available from 02/07/2015. When compared with 01/15/2015, the fracture fragments head better apposition and these findings are suggestive of repeat fracture. The distal femur appears within normal limits. IMPRESSION: Changes of prior proximal femoral fracture with rod fixation. There appears to be increased offset at the fracture site when compared with the film from August of 2016. It would be difficult to exclude a repeat fracture in this area. Considerable callus formation is noted. Electronically Signed   By: Alcide Clever M.D.   On: 03/08/2015 18:46    Recent  Labs  03/07/15 0604  WBC 4.4  HGB 12.3*  HCT 36.4*  PLT 213    Recent Labs  03/07/15 0604  NA 136   K 3.8  CL 97*  GLUCOSE 98  BUN 10  CREATININE 0.69  CALCIUM 10.4*   CBG (last 3)  No results for input(s): GLUCAP in the last 72 hours.  Wt Readings from Last 3 Encounters:  03/09/15 63.8 kg (140 lb 10.5 oz)  02/12/15 73.936 kg (163 lb)  07/31/12 63.504 kg (140 lb)    Physical Exam:  Constitutional: He appears well-developed and well-nourished.  Alert.  HENT:  Head: Normocephalic and atraumatic.  Right Ear: External ear normal.  Left Ear: External ear normal.  Eyes: Right eye exhibits no discharge. Left eye exhibits no discharge. No scleral icterus. EOMI.  Neck: Normal range of motion. Neck supple. No JVD present. No tracheal deviation present. No thyromegaly present. Hypergranulation over closed stoma--associated drainage Cardiovascular: Normal rate and regular rhythm.  Respiratory: Effort normal and breath sounds normal. No respiratory distress. He has no wheezes. no rales GI: Soft. Bowel sounds are normal. He exhibits no distension. There is no tenderness. There is no rebound and no guarding.  PEG (foley) removed 10/20-- clean/dry.   Musculoskeletal: He exhibits no edema. No pain with range of motion of the knee or ankle, moderate tenderness with palpation of the tibia on the left side, no knee joint swelling, no ankle joint swelling Neurological: speaks, usually in a whisper, occasionally phonates partially.  Moves all extremities.  Still some primitive posturing with flexion in the upper and lower limbs which can be broken with verbal and tactile cues, left more than right.. No hypersensitivity to touch in both lower extremities Skin:  Numerous surgical scars on both LE's. All wounds essentially healed in both legs.  Neuro: follows one step commands more quickly. Sitting posture and head position improving.  Psychiatric:  Flat affect, ?depressed  Assessment/Plan: 1. 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 Physiatrist is providing close team supervision and 24 hour management of active medical problems listed below. Physiatrist and rehab team continue to assess barriers to discharge/monitor patient progress toward functional and medical goals.  Function:  Bathing Bathing position Bathing activity did not occur: N/A (Night Bath) Position: Shower  Bathing parts Body parts bathed by patient: Chest, Front perineal area, Abdomen, Right upper leg, Left upper leg Body parts bathed by helper: Right arm, Left arm, Buttocks, Right lower leg, Left lower leg, Back  Bathing assist Assist Level: Touching or steadying assistance(Pt > 75%)      Upper Body Dressing/Undressing Upper body dressing   What is the patient wearing?: Pull over shirt/dress     Pull over shirt/dress - Perfomed by patient: Thread/unthread right sleeve, Thread/unthread left sleeve, Put head through opening, Pull shirt over trunk Pull over shirt/dress - Perfomed by helper: Thread/unthread left sleeve, Put head through opening, Pull shirt over trunk        Upper body assist Assist Level: Set up, Touching or steadying assistance(Pt > 75%)   Set up : To obtain clothing/put away  Lower Body Dressing/Undressing Lower body dressing   What is the patient wearing?: Underwear, Pants, Non-skid slipper socks   Underwear - Performed by helper: Thread/unthread right underwear leg, Thread/unthread left underwear leg, Pull underwear up/down   Pants- Performed by helper: Thread/unthread right pants leg, Thread/unthread left pants leg, Pull pants up/down  Non-skid slipper socks- Performed by helper: Don/doff right sock, Don/doff left sock   Socks - Performed by helper: Don/doff right sock, Don/doff left sock              Lower body assist Assist for lower body dressing: Touching or steadying assistance (Pt > 75%)      Toileting Toileting Toileting activity did not occur: N/A    Toileting steps completed by helper: Adjust clothing prior to toileting, Performs perineal hygiene, Adjust clothing after toileting    Toileting assist Assist level: Two helpers   Transfers Chair/bed transfer   Chair/bed transfer method: Lateral scoot Chair/bed transfer assist level: Touching or steadying assistance (Pt > 75%) Chair/bed transfer assistive device: Armrests, Sliding board Mechanical lift: Maximove   Locomotion Ambulation Ambulation activity did not occur: Safety/medical concerns (NWB bilateral lower extremities)         Wheelchair Wheelchair activity did not occur: Safety/medical concerns (low level TBI) Type: Manual Max wheelchair distance: 150 ft Assist Level: Touching or steadying assistance (Pt > 75%)  Cognition Comprehension Comprehension assist level: Understands basic 50 - 74% of the time/ requires cueing 25 - 49% of the time  Expression Expression assist level: Expresses basic 25 - 49% of the time/requires cueing 50 - 75% of the time. Uses single words/gestures.  Social Interaction Social Interaction assist level: Interacts appropriately 25 - 49% of time - Needs frequent redirection.  Problem Solving Problem solving assist level: Solves basic less than 25% of the time - needs direction nearly all the time or does not effectively solve problems and may need a restraint for safety  Memory Memory assist level: Recognizes or recalls 25 - 49% of the time/requires cueing 50 - 75% of the time   Medical Problem List and Plan: 1. 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 2. DVT Prophylaxis/Anticoagulation: Subcutaneous Lovenox as well as IVC filter 01/12/2015---still indicated 3. Pain Management: have weaned off a lot of pain meds.    -increasing pain complaints likely due more awareness and  coming off fentanyl patch.   - continue tramadol 100 mg every 8 hours monitor for sedation, started low-dose MS Contin 15 mg morning to help with daytime pain 4. Mood/behavior/cognition: ritalin  to improve attention/initiation further.    -propranolol weaned.  Weaning klonopin due to neurosedating effects.  -sleep improving   -seroquel -- decreased to , decreased to  on 10/21    -continue sleep chart  -family at bedside for safety reasons---otherwise this patient would require some sort of restraintg 5. Neuropsych: This patient is not capable of making decisions on his own behalf. 6. Skin/Wound Care: Routine skin checks continue 7. Fluids/Electrolytes/Nutrition:   -K+ increased due to hemolysis. The remainders of labs normal  - Recent labs stable 8. Uncontrollable rhythmic movements/spasticity. Improved. Likely related to distractibility, also on baclofen  - Continue Vimpat   -Started tapering Keppra to 750 BID on 10/19.   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 Dysphagia: now on D1/thins  -Gastrostomy tube 01/18/2015   -off bolus feeds now, needs to push po  -dc supps and h20 boluses  -Will d/c foley tube 10/20  -all meds changed to oral form   11. Large tension pneumothorax on the right with multiple rib fractures/pulmonary  -?LLL pneumonia---CXR resolved.  -aspiration precautions  -trach stoma closing---apply silver  nitrate to hypergranulation area 12. Multiple orthopedic fractures, L1-2 transverse process fractures,/left femur fracture with IM nailing, left and right open tibia fibular fractures, transverse acetabulum fracture status post percutaneous pinning,CM pelvic ring status post ORIF, pubic symphysis and trans-sacral screw forL S I diastasis and right sacralalo fracture. Follow-up x-ray personally reviewed, which shows healing of fibula >tibia.  Ortho reevaluated pt and he is  now WBAT b/l LE.  13. Urine retention/neurogenic bladder: flomax trial, urecholine trial  -spontaneous voiding improved. pvr's slowly decreasing, no I/O cath since yesterday  -timed voiding to continue to improve continence   LOS (Days) 24 A FACE TO FACE EVALUATION WAS PERFORMED  Ankit Karis Juba 03/09/2015 11:30 AM

## 2015-03-09 NOTE — Progress Notes (Signed)
Occupational Therapy Session Note  Patient Details  Name: Juan [name] MRN: [id number] Date of Birth: 06/03/1990  Today's Date: 03/09/2015 OT Individual Time: 0700-0800 OT Individual Time Calculation (min): 60 min    Short Term Goals: Week 3:  OT Short Term Goal 1 (Week 3): Pt will maintain dynamic sitting balance EOB during dressing tasks with min A for 15 mins. OT Short Term Goal 2 (Week 3): Pt will perform UB dressing tasks with mod A OT Short Term Goal 3 (Week 3): Pt will bathe UB with min A while seated in supported position with min verbal cues for initiation OT Short Term Goal 4 (Week 3): Pt will exhibit sustained attention during ADLs for 3 min with min verbal cues  Skilled Therapeutic Interventions/Progress Updates:    Pt resting in bed with Juan [name] present.  Pt required min multimodal cues to arouse patient.  Pt would not initially acknowledge therapist's greeting but eventually offered a smile.  Pt initiated threading BLE into pants while sitting in long sitting in bed.  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  Pt was able to position BLE over edge of bed and perform supine->sit EOB with steady A this morning in addition to performing lateral lean to facilitate placement of sliding board.  Pt provided BUE assistance with sliding board transfer to w/c.  Pt engaged in grooming tasks at sink including washing face, brushing teeth, and shaving.  Pt completed washing face and brushing teeth with setup/supervision this morning.  Pt initiated shaving task and completed approx 50% of task without assistance.  Pt required more than a reasonable amount of time to initiate and complete all tasks.  Focus on task initiation, task completion, sitting balance, functional transfers, BADL retraining, attention to task, and safety awareness to increase independence with BADLs.  Therapy Documentation Precautions:  Precautions Precautions: Fall Precaution Comments: BLE  NWB; trach, peg Required Braces or Orthoses: Other Brace/Splint Other Brace/Splint: B resting hand splints; B PRAFO Restrictions Weight Bearing Restrictions: Yes RLE Weight Bearing: Non weight bearing LLE Weight Bearing: Non weight bearing Pain: Pain Assessment Pain Assessment: No/denies pain Pain Score: 0-No pain Pain Intervention(s): Medication (See eMAR) (sched ultram)  See Function Navigator for Current Functional Status.   Therapy/Group: Individual Therapy  Juan [name], Juan [name] 03/09/2015, 8:03 AM

## 2015-03-09 NOTE — Progress Notes (Signed)
Orthopaedic Trauma Service Progress Note  Subjective  Appears much improved Sitting in WC    ROS   Objective   BP 123/73 mmHg  Pulse 68  Temp(Src) 97.9 F (36.6 C) (Oral)  Resp 16  Ht 5\' 6"  (1.676 m)  Wt 63.8 kg (140 lb 10.5 oz)  BMI 22.71 kg/m2  SpO2 100%  Intake/Output      10/20 0701 - 10/21 0700 10/21 0701 - 10/22 0700   P.O. 480    Total Intake(mL/kg) 480 (7.5)    Urine (mL/kg/hr) 1600 (1)    Stool 0 (0)    Total Output 1600     Net -1120          Urine Occurrence 6 x    Stool Occurrence 1 x      Xrays   Multiple films of pelvis, R tibia, L tibia and L femur reviewed.  All films show callus formation and stable hardware. L femur notable for HO anteriorly.   Medial cortex of R tibia of some concern but other 3 corticies look great     Exam  Gen: sitting in WC, appears comfortable  Ext:     Pelvis and B LEx  NT with palpation  Knees lack full extension B, but very good ROM   HO palpable L thigh, does not appear to cause discomfort with palpation  All traumatic and surgical wounds look great  Swelling well controlled  exts are warm     Assessment and Plan   Approximately 7 1/2 weeks s/p ORIF/IMN polytrauma B LEx and Pelvis    WBAT B LEX No ROM restrictions Will check back next week  F/u xrays in 2 weeks   Overall very encouraged by what we see on xrays given magnitude of injury Average healing time for open tibia fractures is 5 months but we allow for WB 6-8 weeks after fixation. Christella HartiganJacobs films look very good and stable His pelvic ring and acetabulum look united as well His Left femur also shows robust healing response We will monitor his HO in Left leg as this may become symptomatic   His clinical and radiographic findings are very encouraging from ortho standpoint   Mearl LatinKeith W. Naima Veldhuizen, PA-C [facility] [phone] (O) 03/09/2015 10:11 AM

## 2015-03-09 NOTE — Progress Notes (Signed)
Occupational Therapy Note  Patient Details  Name: Juan [name] MRN: [id number] Date of Birth: 07/04/1990  Today's Date: 03/09/2015 OT Individual Time: 1400-1430 OT Individual Time Calculation (min): 30 min   Pt denied pain Individual therapy  Pt resting in w/c upon arrival with family present. [facility] in [facility] tasks with focus on in-hand manipulation of small objects and placing in appropriate holes.  Pt continues to exhibit apraxic movements which improves with repetitive movements in similar patterns.  Discussed with patient the reasoning for practicing seemingly easy tasks.  Pt indicated understanding.  Pt returned to room and continued discharge planning with family, including recommendations and continued education of activities in room; i.e. Dressing, bed mobility, exercises.   [name], Jeanita Carneiro [facility] 03/09/2015, 2:51 PM

## 2015-03-09 NOTE — Progress Notes (Signed)
Speech Language Pathology Daily Session Note  Patient Details  Name: Juan [name] MRN: [id number] Date of Birth: 03/04/1991  Today's Date: 03/09/2015 SLP Individual Time: 0830-0930 SLP Individual Time Calculation (min): 60 min  Short Term Goals: Week 4: SLP Short Term Goal 1 (Week 4): Patient will sustain attention to a task for 5 minutes with Max A multimodal cues.  SLP Short Term Goal 2 (Week 4): Patient will initiate functional tasks in 90% of opportunities with Mod A verbal cues.  SLP Short Term Goal 3 (Week 4): Patient will vocalize with Max A multimodal cues in 25% of opportunities  SLP Short Term Goal 4 (Week 4): Patient will express basic wants/needs in 90% of opportunities with Mod A multimodal cues.  SLP Short Term Goal 5 (Week 4): Patient will dys 2 textures and thin liquids with minimal overt s/s of aspiration with Mod A verbal cues for use of swallowing compensatory strategies.   Skilled Therapeutic Interventions: Skilled treatment focused on addressing cognitive-linguistic goals. SLP facilitated session Max encouragement to participate in basic reading and writing diagnostic treatment tasks.  Patient's reading comprehension was 100% accurate at the word and sentence level with breakdown at the paragraph level; however, motivation and participation were also factors.  Patient dictated accurate spelling to SLP for CVC words; however, errors were present with all more complex word structures.  Patient able to approximate letters of his name with right hand following SLP model in 3/4 letters.  Patient with minimal verbal output in today's session.  SLP also facilitated session with a sorting task, field of 4 with Max assist verbal cues for initiation and Max assist multimodal cues for self-monitoring and correcting of accuracy.    Pain Pain Assessment Pain Assessment: No/denies pain  Therapy/Group: Individual Therapy  Charlane FerrettiMelissa Karsyn Jamie, M.A.,  CCC-SLP [phone]  Juan [name] 03/09/2015, 11:07 AM

## 2015-03-09 NOTE — Progress Notes (Signed)
Physical Therapy Note  Patient Details  Name: Juan [name] MRN: [id number] Date of Birth: 12/06/1990 Today's Date: 03/09/2015    Time: 1000-1100 60 minutes  1:1 No c/o pain.  Ortho present when PT arrived, stated pt is now WBAT BLEs.  Sit to stand multiple attempts at railing in hallway and with max A.  Pt unable to fully stand up due to hip and knee weakness, no c/o pain with weight bearing.  Stair negotiation with +2 assist for safety, pt able to ascend/descend 1 stair with max/total A.  Squat/scooting transfers practiced with focus now on wt bearing through LEs, pt requires increased time but improves with repetition. Nustep x 4 minutes with multiple rests, pt very motivated to go fast and enjoys nu step.  Pt's mother checked off for transfers w/c <> bed.   Juan [name] 03/09/2015, 10:57 AM

## 2015-03-09 NOTE — Progress Notes (Signed)
Occupational Therapy Weekly Progress Note  Patient Details  Name: Juan [name] MRN: [id number] Date of Birth: 1990/08/25  Beginning of progress report period: March 02, 2015 End of progress report period: March 09, 2015  Patient has met 4 of 4 short term goals.  Pt has made steady progress with BADLs, task initiation, sequencing, attention to task, and activity tolerance during this reporting period.  Pt currently performs UB dressing tasks with steady A while seated at EOB with occasional assist for sitting balance.  Pt lifts BLE to facilitate threading of pants and initiates pulling up pants but requires assistance to complete task.  Pt initiates all bathing tasks but requires HOH assistance to complete tasks.Pt continues to exhibit apraxic movements when performing tasks but exhibits improvement with repetition and reenforcement.  Pt's mother and girlfriend have been present and actively participated in BADL therapy sessions.  Pt was cleared today for bilateral WBAT through LEs which allows for more participation in transfers and standing now which began today.    Patient continues to demonstrate the following deficits: muscle weakness and muscle joint tightness, decreased cardiorespiratoy endurance, impaired timing and sequencing, abnormal tone, unbalanced muscle activation, motor apraxia, decreased coordination and decreased motor planning, decreased visual perceptual skills, decreased motor planning, decreased initiation, decreased attention, decreased awareness, decreased problem solving, decreased safety awareness, decreased memory and delayed processing and decreased sitting balance, decreased standing balance, decreased postural control, hemiplegia, decreased balance strategies and difficulty maintaining precautions and therefore will continue to benefit from skilled OT intervention to enhance overall performance with BADL and Reduce care partner burden.  Patient progressing toward long  term goals..  Continue plan of care.  Added a LTG for standing balance.   OT Short Term Goals Week 3:  OT Short Term Goal 1 (Week 3): Pt will maintain dynamic sitting balance EOB during dressing tasks with min A for 15 mins. OT Short Term Goal 1 - Progress (Week 3): Met OT Short Term Goal 2 (Week 3): Pt will perform UB dressing tasks with mod A OT Short Term Goal 2 - Progress (Week 3): Met OT Short Term Goal 3 (Week 3): Pt will bathe UB with min A while seated in supported position with min verbal cues for initiation OT Short Term Goal 3 - Progress (Week 3): Met OT Short Term Goal 4 (Week 3): Pt will exhibit sustained attention during ADLs for 3 min with min verbal cues OT Short Term Goal 4 - Progress (Week 3): Met Week 4:  OT Short Term Goal 1 (Week 4): Pt will perform shower transfers with max A - squat pivot OT Short Term Goal 2 (Week 4): Pt will perform BSC transfers with max A - squat pivot OT Short Term Goal 3 (Week 4): Pt will perform LB dressing tasks with max A OT Short Term Goal 4 (Week 4): Pt will perform toileting tasks with max A   Therapy Documentation Precautions:  Precautions Precautions: Fall Precaution Comments: BLE NWB; trach, peg Required Braces or Orthoses: Other Brace/Splint Other Brace/Splint: B resting hand splints; B PRAFO Restrictions Weight Bearing Restrictions: Yes RLE Weight Bearing: Non weight bearing LLE Weight Bearing: Non weight bearing  See Function Navigator for Current Functional Status.    Leotis Shames [facility] 03/09/2015, 3:00 PM

## 2015-03-10 ENCOUNTER — Inpatient Hospital Stay (HOSPITAL_COMMUNITY): Payer: 59 | Admitting: Speech Pathology

## 2015-03-10 NOTE — Progress Notes (Signed)
Speech Language Pathology Daily Session Note  Patient Details  Name: Juan [name] XXXHunt MRN: [id number] Date of Birth: 10/09/1990  Today'[name] Date: 03/10/2015 SLP Individual Time: 1435-1459 SLP Individual Time Calculation (min): 24 min  Short Term Goals: Week 4: SLP Short Term Goal 1 (Week 4): Patient will sustain attention to a task for 5 minutes with Max A multimodal cues.  SLP Short Term Goal 2 (Week 4): Patient will initiate functional tasks in 90% of opportunities with Mod A verbal cues.  SLP Short Term Goal 3 (Week 4): Patient will vocalize with Max A multimodal cues in 25% of opportunities  SLP Short Term Goal 4 (Week 4): Patient will express basic wants/needs in 90% of opportunities with Mod A multimodal cues.  SLP Short Term Goal 5 (Week 4): Patient will dys 2 textures and thin liquids with minimal overt [name]/[name] of aspiration with Mod A verbal cues for use of swallowing compensatory strategies.   Skilled Therapeutic Interventions:  Pt was seen for skilled ST targeting cognitive goals.  SLP facilitated the session with a basic barrier task targeting initiation and sustained attention. Pt initiated turn taking during the abovementioned task with overall min-mod assist verbal cues.  He sustained attention to task for ~3 minute intervals with mod verbal cues for redirection.  He also benefited from max assist verbal cues for functional problem solving due to impulsivity and decreased working memory of task protocols and procedures.  Pt was returned to room and left in wheelchair with quick release belt donned and family present at bedside.  Continue per current plan of care.    Function:  Eating Eating                 Cognition Comprehension Comprehension assist level: Understands basic 50 - 74% of the time/ requires cueing 25 - 49% of the time  Expression   Expression assist level: Expresses basic 25 - 49% of the time/requires cueing 50 - 75% of the time. Uses single words/gestures.   Social Interaction Social Interaction assist level: Interacts appropriately 50 - 74% of the time - May be physically or verbally inappropriate.  Problem Solving Problem solving assist level: Solves basic 25 - 49% of the time - needs direction more than half the time to initiate, plan or complete simple activities  Memory Memory assist level: Recognizes or recalls 25 - 49% of the time/requires cueing 50 - 75% of the time    Pain Pain Assessment Pain Assessment: No/denies pain  Therapy/Group: Individual Therapy  Alroy Portela, Melanee SpryNicole L 03/10/2015, 4:12 PM

## 2015-03-10 NOTE — Progress Notes (Signed)
[facility] PH[facility]ICAL MEDICINE & REHABILITATION     PROGRESS NOTE    Subjective/Complaints:  Patient seen this morning lying in bed. His father's at bedside. Patient slept well overnight. His father states his son is making good progress in therapies. He has questions regarding patient's prognosis, which are answered.  ROS --limited due to cognitive status. Appears to deny leg pain, foot pain  Objective: Vital Signs: Blood pressure 123/72, pulse 79, temperature 98.9 F (37.2 C), temperature source Oral, resp. rate 16, height 5\' 6"  (1.676 m), weight 63.8 kg (140 lb 10.5 oz), SpO2 100 %. Dg Tibia/fibula Right  03/08/2015  CLINICAL DATA:  Fracture.  Subsequent encounter. EXAM: RIGHT TIBIA AND FIBULA - 2 VIEW COMPARISON:  Radiographs 02/07/2015 FINDINGS: Intra medullary rod with proximal and distal locking screws traversing midshaft tibia fracture. The hardware is intact without periprosthetic lucency. There is been interval fracture healing with development of bridging callus laterally in non bridging callus posteriorly. The medial and anterior fracture lines remain readily visible. Fracture alignment is unchanged. Interval callus formation about the midshaft fibular fracture posterior, medial and laterally. Fibular fracture alignment is unchanged. Ghost tracks from prior external fixator are again seen. IMPRESSION: Post ORIF right tibial fracture with interval callus formation consistent with interval healing. No hardware complication. Interval callus formation about the midshaft fibular fracture. Alignment is unchanged. Electronically Signed   By: Rubye OaksMelanie  Ehinger M.D.   On: 03/08/2015 18:40   Dg Pelvis Comp Min 3v  03/08/2015  CLINICAL DATA:  Follow up pelvic fractures post ORIF. Subsequent encounter. EXAM: JUDET PELVIS - 3+ VIEW COMPARISON:  Radiographs 02/07/2015 and 01/15/2015. FINDINGS: The surgical hardware appears unchanged. A screw traverses both sacroiliac joints. There has been plate and  screw fixation of the symphysis pubis, and there are 2 cannulated screws in the left acetabulum. Left femoral intramedullary nail is partially imaged. The pelvic fractures appear unchanged in alignment. There is still some displacement of the inferior pubic ramus and ischium on the right, although some evidence of progressive healing is noted. The comminuted fracture of the proximal left femur also demonstrates partial healing. IMPRESSION: 1. Intact hardware and stable alignment status post fixation of multiple pelvic fractures. 2. Progressive partial healing of the fractures. Electronically Signed   By: Carey BullocksWilliam  Veazey M.D.   On: 03/08/2015 18:38   Dg Femur Min 2 Views Left  03/08/2015  CLINICAL DATA:  History of multiple pelvic fractures with fixation EXAM: LEFT FEMUR 2 VIEWS COMPARISON:  02/07/2015, 01/15/2015 FINDINGS: There again multiple fixation screws traversing the left sacroiliac joint as well as the pelvis on the left. A fixation sideplate with multiple fixation screws traverses the pubic symphysis anteriorly. A medullary rod is noted within the femur. A proximal femoral shaft fracture line is again seen with callus formation although the union is incomplete. On the oblique image there appears to be widening at the fracture site although a similar view is new not available from 02/07/2015. When compared with 01/15/2015, the fracture fragments head better apposition and these findings are suggestive of repeat fracture. The distal femur appears within normal limits. IMPRESSION: Changes of prior proximal femoral fracture with rod fixation. There appears to be increased offset at the fracture site when compared with the film from August of 2016. It would be difficult to exclude a repeat fracture in this area. Considerable callus formation is noted. Electronically Signed   By: Alcide CleverMark  Lukens M.D.   On: 03/08/2015 18:46   No results for input(s): WBC, HGB, HCT, PLT  in the last 72 hours. No results for  input(s): NA, K, CL, GLUCOSE, BUN, CREATININE, CALCIUM in the last 72 hours.  Invalid input(s): CO CBG (last 3)  No results for input(s): GLUCAP in the last 72 hours.  Wt Readings from Last 3 Encounters:  03/09/15 63.8 kg (140 lb 10.5 oz)  02/12/15 73.936 kg (163 lb)  07/31/12 63.504 kg (140 lb)    Physical Exam:  Constitutional: He appears well-developed and well-nourished.  Alert.  HENT:  Head: Normocephalic and atraumatic.  Right Ear: External ear normal.  Left Ear: External ear normal.  Eyes: Right eye exhibits no discharge. Left eye exhibits no discharge. No scleral icterus. EOMI.  Neck: Normal range of motion. Neck supple. No JVD present. No tracheal deviation present. No thyromegaly present. Hypergranulation over closed stoma--associated drainage Cardiovascular: Normal rate and regular rhythm.  Respiratory: Effort normal and breath sounds normal. No respiratory distress. He has no wheezes. no rales GI: Soft. Bowel sounds are normal. He exhibits no distension. There is no tenderness. There is no rebound and no guarding.  PEG area clean/dry.   Musculoskeletal: He exhibits no edema. No pain with range of motion of the knee or ankle, moderate tenderness with palpation of the tibia on the left side, no knee joint swelling, no ankle joint swelling Neurological: speaks, usually in a whisper, occasionally phonates partially.  Moves all extremities.  Still some primitive posturing with flexion in the upper and lower limbs which can be broken with verbal and tactile cues, left more than right.. No hypersensitivity to touch in both lower extremities Skin:  Numerous surgical scars on both LE's. All wounds essentially healed in both legs.  Neuro: follows one step commands more quickly. Sitting posture and head position improving.  Psychiatric:  Flat affect, ?depressed  Assessment/Plan: 1. 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 Physiatrist is providing close team supervision and 24 hour management of active medical problems listed below. Physiatrist and rehab team continue to assess barriers to discharge/monitor patient progress toward functional and medical goals.  Function:  Bathing Bathing position Bathing activity did not occur: N/A (Night Bath) Position: Bed  Bathing parts Body parts bathed by patient: Chest, Front perineal area, Abdomen, Right upper leg, Left upper leg Body parts bathed by helper: Right arm, Left arm, Chest, Abdomen, Front perineal area, Buttocks, Right upper leg, Left upper leg, Right lower leg, Left lower leg, Back  Bathing assist Assist Level: Touching or steadying assistance(Pt > 75%)      Upper Body Dressing/Undressing Upper body dressing   What is the patient wearing?: Hospital gown     Pull over shirt/dress - Perfomed by patient: Thread/unthread right sleeve, Thread/unthread left sleeve, Put head through opening, Pull shirt over trunk Pull over shirt/dress - Perfomed by helper: Thread/unthread left sleeve, Put head through opening, Pull shirt over trunk        Upper body assist Assist Level: Set up   Set up : To obtain clothing/put away  Lower Body Dressing/Undressing Lower body dressing   What is the patient wearing?: Hospital Gown   Underwear - Performed by helper: Thread/unthread right underwear leg, Thread/unthread left underwear leg, Pull underwear up/down   Pants- Performed by helper: Thread/unthread right pants leg, Thread/unthread left pants leg, Pull pants up/down   Non-skid slipper socks- Performed by helper: Don/doff right sock, Don/doff left sock   Socks - Performed by helper: Don/doff right sock,  Don/doff left sock              Lower body assist Assist for lower body dressing: Touching or steadying assistance (Pt > 75%)      Toileting Toileting Toileting activity did not occur:  N/A   Toileting steps completed by helper: Adjust clothing prior to toileting, Performs perineal hygiene, Adjust clothing after toileting    Toileting assist Assist level: Two helpers   Transfers Chair/bed transfer   Chair/bed transfer method: Lateral scoot Chair/bed transfer assist level: Touching or steadying assistance (Pt > 75%) Chair/bed transfer assistive device: Armrests, Sliding board Mechanical lift: Maximove   Locomotion Ambulation Ambulation activity did not occur: Safety/medical concerns (NWB bilateral lower extremities)         Wheelchair Wheelchair activity did not occur: Safety/medical concerns (low level TBI) Type: Manual Max wheelchair distance: 150 ft Assist Level: Touching or steadying assistance (Pt > 75%)  Cognition Comprehension Comprehension assist level: Understands basic 50 - 74% of the time/ requires cueing 25 - 49% of the time  Expression Expression assist level: Expresses basic 25 - 49% of the time/requires cueing 50 - 75% of the time. Uses single words/gestures.  Social Interaction Social Interaction assist level: Interacts appropriately 25 - 49% of time - Needs frequent redirection.  Problem Solving Problem solving assist level: Solves basic 25 - 49% of the time - needs direction more than half the time to initiate, plan or complete simple activities  Memory Memory assist level: Recognizes or recalls 25 - 49% of the time/requires cueing 50 - 75% of the time   Medical Problem List and Plan: 1. 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 2. DVT Prophylaxis/Anticoagulation: Subcutaneous Lovenox as well as IVC filter 01/12/2015---still indicated 3. Pain Management: have weaned off a lot of pain meds.    -increasing pain complaints likely due more awareness and coming off fentanyl  patch.   - continue tramadol 100 mg every 8 hours monitor for sedation, started low-dose MS Contin 15 mg morning to help with daytime pain 4. Mood/behavior/cognition: ritalin  to improve attention/initiation further.    -propranolol weaned.  Weaning klonopin due to neurosedating effects.  -sleep improving   -seroquel -- decreased to , decreased to  on 10/21, will cont to gradually wean    -continue sleep chart  -family at bedside for safety reasons---otherwise this patient would require some sort of restraintg 5. Neuropsych: This patient is not capable of making decisions on his own behalf. 6. Skin/Wound Care: Routine skin checks continue 7. Fluids/Electrolytes/Nutrition:   -K+ increased due to hemolysis. The remainders of labs normal  - Recent labs stable 8. Uncontrollable rhythmic movements/spasticity. Improved. Likely related to distractibility, also on baclofen  - Continue Vimpat   -Started tapering Keppra to 750 BID on 10/19.   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 Dysphagia: now on D1/thins  -Gastrostomy tube 01/18/2015   -off bolus feeds now, needs to push po  -dc supps and h20 boluses  -PEG/ foley d/ced 10/20 without complications  -all meds changed to oral form   11. Large tension pneumothorax on the right with multiple rib fractures/pulmonary  -?LLL pneumonia---CXR resolved.  -aspiration precautions  -trach stoma closing---apply silver nitrate to hypergranulation area 12. Multiple orthopedic fractures, L1-2 transverse process fractures,/left femur fracture with IM nailing, left and right open  tibia fibular fractures, transverse acetabulum fracture status post percutaneous pinning,CM pelvic ring status post ORIF, pubic symphysis and trans-sacral screw forL S I diastasis and right sacralalo fracture. Follow-up x-ray personally reviewed, which shows healing of fibula >tibia.   Ortho reevaluated pt and he is now WBAT b/l LE.  13. Urine retention/neurogenic bladder: flomax trial, urecholine trial  -spontaneous voiding improved. pvr's slowly decreasing, no I/O cath since yesterday  -timed voiding to continue to improve continence   LOS (Days) 25 A FACE TO FACE EVALUATION WAS PERFORMED  Ankit Karis Juba 03/10/2015 10:57 AM

## 2015-03-11 ENCOUNTER — Inpatient Hospital Stay (HOSPITAL_COMMUNITY): Payer: Self-pay

## 2015-03-11 NOTE — Progress Notes (Signed)
[facility] PH[facility]ICAL MEDICINE & REHABILITATION     PROGRESS NOTE    Subjective/Complaints:  Pt seen and examined this AM laying in bed.  Mother at bedside.  She states he is doing well.  He slept well again last night and she states that he likes to sleep even before his injury and that he never was a morning person.    ROS --limited due to cognitive status. Appears to deny leg pain, foot pain  Objective: Vital Signs: Blood pressure 113/72, pulse 86, temperature 97.7 F (36.5 C), temperature source Oral, resp. rate 18, height  (1.676 m), weight 67 kg (147 lb 11.3 oz), SpO2 100 %. No results found. No results for input(s): WBC, HGB, HCT, PLT in the last 72 hours. No results for input(s): NA, K, CL, GLUCOSE, BUN, CREATININE, CALCIUM in the last 72 hours.  Invalid input(s): CO CBG (last 3)  No results for input(s): GLUCAP in the last 72 hours.  Wt Readings from Last 3 Encounters:  03/11/15 67 kg (147 lb 11.3 oz)  02/12/15 73.936 kg (163 lb)  07/31/12 63.504 kg (140 lb)    Physical Exam:  Constitutional: He appears well-developed and well-nourished.  Alert.  HENT:  Head: Normocephalic and atraumatic.  Right Ear: External ear normal.  Left Ear: External ear normal.  Eyes: Right eye exhibits no discharge. Left eye exhibits no discharge. No scleral icterus. EOMI.  Neck: Normal range of motion. Neck supple. No JVD present. No tracheal deviation present. No thyromegaly present. Hypergranulation over closed stoma--associated drainage Cardiovascular: Normal rate and regular rhythm.  Respiratory: Effort normal and breath sounds normal. No respiratory distress. He has no wheezes. no rales GI: Soft. Bowel sounds are normal. He exhibits no distension. There is no tenderness. There is no rebound and no guarding.  PEG area clean/dry.   Musculoskeletal: He exhibits no edema. No pain with range of motion of the knee or ankle, moderate tenderness with palpation of the tibia on the left  side, no knee joint swelling, no ankle joint swelling Neurological: speaks, usually in a whisper, occasionally phonates partially.  Moves all extremities.  Still some primitive posturing with flexion in the upper and lower limbs which can be broken with verbal and tactile cues, left more than right.. No hypersensitivity to touch in both lower extremities Skin:  Numerous surgical scars on both LE's. All wounds essentially healed in both legs.  Neuro: follows one step commands more quickly. Sitting posture and head position improving.  Psychiatric:  Flat affect, ?depressed  Assessment/Plan: 1. 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 Physiatrist is providing close team supervision and 24 hour management of active medical problems listed below. Physiatrist and rehab team continue to assess barriers to discharge/monitor patient progress toward functional and medical goals.  Function:  Bathing Bathing position Bathing activity did not occur: N/A (Night Bath) Position: Bed  Bathing parts Body parts bathed by patient: Chest, Front perineal area, Abdomen, Right upper leg, Left upper leg Body parts bathed by helper: Right arm, Left arm, Chest, Abdomen, Front perineal area, Buttocks, Right upper leg, Left upper leg, Right lower leg, Left lower leg, Back  Bathing assist Assist Level: Touching or steadying assistance(Pt > 75%)      Upper Body Dressing/Undressing Upper body dressing   What is the patient wearing?: Pull over shirt/dress     Pull over shirt/dress - Perfomed by patient: Thread/unthread right sleeve, Thread/unthread  left sleeve Pull over shirt/dress - Perfomed by helper: Put head through opening, Pull shirt over trunk        Upper body assist Assist Level: More than reasonable time   Set up : To obtain clothing/put away  Lower Body Dressing/Undressing Lower body  dressing Lower body dressing/undressing activity did not occur: N/A (diaper) What is the patient wearing?: Hospital Gown   Underwear - Performed by helper: Thread/unthread right underwear leg, Thread/unthread left underwear leg, Pull underwear up/down   Pants- Performed by helper: Thread/unthread right pants leg, Thread/unthread left pants leg, Pull pants up/down   Non-skid slipper socks- Performed by helper: Don/doff right sock, Don/doff left sock   Socks - Performed by helper: Don/doff right sock, Don/doff left sock              Lower body assist Assist for lower body dressing: Touching or steadying assistance (Pt > 75%)      Toileting Toileting Toileting activity did not occur: N/A   Toileting steps completed by helper: Adjust clothing prior to toileting, Performs perineal hygiene, Adjust clothing after toileting Toileting Assistive Devices: Grab bar or rail  Toileting assist Assist level: Two helpers   Transfers Chair/bed transfer   Chair/bed transfer method: Lateral scoot Chair/bed transfer assist level: Touching or steadying assistance (Pt > 75%) Chair/bed transfer assistive device: Armrests, Sliding board Mechanical lift: Maximove   Locomotion Ambulation Ambulation activity did not occur: Safety/medical concerns (NWB bilateral lower extremities)   Max distance: 4 Assist level: 2 helpers (min to mod A with +2 for w/c follow)   Wheelchair Wheelchair activity did not occur: Safety/medical concerns (low level TBI) Type: Manual Max wheelchair distance: 150 ft Assist Level: Dependent (Pt equals 0%) (tilt in space w/c)  Cognition Comprehension Comprehension assist level: Understands basic 50 - 74% of the time/ requires cueing 25 - 49% of the time  Expression Expression assist level: Expresses basic 25 - 49% of the time/requires cueing 50 - 75% of the time. Uses single words/gestures.  Social Interaction Social Interaction assist level: Interacts appropriately 50 - 74% of  the time - May be physically or verbally inappropriate.  Problem Solving Problem solving assist level: Solves basic 25 - 49% of the time - needs direction more than half the time to initiate, plan or complete simple activities  Memory Memory assist level: Recognizes or recalls 25 - 49% of the time/requires cueing 50 - 75% of the time   Medical Problem List and Plan: 1. 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 2. DVT Prophylaxis/Anticoagulation: Subcutaneous Lovenox as well as IVC filter 01/12/2015---still indicated 3. Pain Management: have weaned off a lot of pain meds.    -increasing pain complaints likely due more awareness and coming off fentanyl patch.   - continue tramadol 100 mg every 8 hours monitor for sedation, started low-dose MS Contin 15 mg morning to help with daytime pain 4. Mood/behavior/cognition: ritalin 15mg  to improve attention/initiation further.    -propranolol weaned.  Weaning klonopin due to neurosedating effects.  -sleep well  -seroquel 75mg -- decreased to 50mg , decreased to 25mg  on 10/21, will cont to gradually wean    -continue sleep chart  -family at bedside for safety reasons---otherwise this patient would require some sort of restraining 5. Neuropsych: This patient is not capable of making decisions on his own behalf. 6. Skin/Wound Care: Routine skin checks continue 7.  Fluids/Electrolytes/Nutrition:   -K+ increased due to hemolysis. The remainders of labs normal  - Recent labs stable 8. Uncontrollable rhythmic movements/spasticity. Improved. Likely related to distractibility, also on baclofen  - Continue Vimpat   -Started tapering Keppra to 750 BID on 10/19.   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  Dysphagia: now on D1/thins  -Gastrostomy tube 01/18/2015   -off bolus feeds now, needs to push po  -dc supps and h20 boluses  -PEG/ foley d/ced 10/20 without complications  -all meds changed to oral form   11. Large tension pneumothorax on the right with multiple rib fractures/pulmonary  -?LLL pneumonia---CXR resolved.  -aspiration precautions  -trach stoma closing---apply silver nitrate to hypergranulation area 12. Multiple orthopedic fractures, L1-2 transverse process fractures,/left femur fracture with IM nailing, left and right open tibia fibular fractures, transverse acetabulum fracture status post percutaneous pinning,CM pelvic ring status post ORIF, pubic symphysis and trans-sacral screw forL S I diastasis and right sacralalo fracture. Follow-up x-ray personally reviewed, which shows healing of fibula >tibia.  Ortho reevaluated pt and he is now WBAT b/l LE.  13. Urine retention/neurogenic bladder: flomax trial, urecholine trial  -spontaneous voiding improved. pvr's slowly decreasing, no I/O cath since yesterday  -timed voiding to continue to improve continence   LOS (Days) 26 A FACE TO FACE EVALUATION WAS PERFORMED  Ankit Karis Juba 03/11/2015 3:47 PM

## 2015-03-11 NOTE — Progress Notes (Signed)
Physical Therapy Session Note  Patient Details  Name: Juan [name] XXXHunt MRN: [id number] Date of Birth: 08/04/1990  Today'[name] Date: 03/11/2015 PT Individual Time: 1300-1330 PT Individual Time Calculation (min): 30 min   Short Term Goals: Week 4:  PT Short Term Goal 1 (Week 4): Patient will perform slide board transfers with min A and 75% cues for sequencing and technique.  PT Short Term Goal 2 (Week 4): Patient will propel wheelchair using BUE in straight path x 50 ft with supervision and max cues.  PT Short Term Goal 3 (Week 4): Patient will sustain attention to functional tasks x 15 min with min cues.  Skilled Therapeutic Interventions/Progress Updates:   Pt already up in recliner with family present to observe session. Focused on neuro re-ed during standing and gait and functional sit <-> stands to increase functional mobility. Sit to stands with supervision to min assist with cues for hand placement and technique as well as facilitation for upright posture. Manual faciliation for weightshifting, postural control and for increased WB to LLE to advance RLE during gait trials. Gait x 3', x 4', and x 4'in parallel bars with overall min to mod assist with +2 for close w/c follow. Pt excited and eager to continue trials. End of session left up in w/c with pt'[name] dad.  Therapy Documentation Precautions:  Precautions Precautions: Fall Precaution Comments: BLE NWB; trach, peg Required Braces or Orthoses: Other Brace/Splint Other Brace/Splint: B resting hand splints; B PRAFO Restrictions Weight Bearing Restrictions: No RLE Weight Bearing: Weight bearing as tolerated LLE Weight Bearing: Weight bearing as tolerated Other Position/Activity Restrictions: 10/21 now WBAT through BLE and no ROM restrictions   Pain:  Denies pain.   See Function Navigator for Current Functional Status.   Therapy/Group: Individual Therapy  Karolee StampsGray, Petula Rotolo Darrol PokeBrescia  Prerna Harold B. Clarance Bollard, PT, DPT  03/11/2015, 2:48 PM

## 2015-03-12 ENCOUNTER — Inpatient Hospital Stay (HOSPITAL_COMMUNITY): Payer: 59 | Admitting: *Deleted

## 2015-03-12 ENCOUNTER — Inpatient Hospital Stay (HOSPITAL_COMMUNITY): Payer: 59 | Admitting: Speech Pathology

## 2015-03-12 ENCOUNTER — Inpatient Hospital Stay (HOSPITAL_COMMUNITY): Payer: Self-pay | Admitting: Physical Therapy

## 2015-03-12 MED ORDER — LEVETIRACETAM 250 MG PO TABS
250.0000 mg | ORAL_TABLET | Freq: Two times a day (BID) | ORAL | Status: AC
  Administered 2015-03-12 – 2015-03-16 (×9): 250 mg via ORAL
  Filled 2015-03-12 (×10): qty 1
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
## 2015-03-12 NOTE — Progress Notes (Signed)
Occupational Therapy Session Note  Patient Details  Name: Juan [name] MRN: [id number] Date of Birth: 01/27/1991  Today's Date: 03/12/2015 OT Individual Time: 0700-0800 OT Individual Time Calculation (min): 60 min    Short Term Goals: Week 4:  OT Short Term Goal 1 (Week 4): Pt will perform shower transfers with max A - squat pivot OT Short Term Goal 2 (Week 4): Pt will perform BSC transfers with max A - squat pivot OT Short Term Goal 3 (Week 4): Pt will perform LB dressing tasks with max A OT Short Term Goal 4 (Week 4): Pt will perform toileting tasks with max A  Skilled Therapeutic Interventions/Progress Updates:    Pt resting in bed with father present.  Pt indicated, when questioned, that he wasn't going to verbally communicate with therapist today.  Pt did, however, talk to MD later in session.  Pt engaged in BADL retraining including bathing at shower level and dressing with sit<>stand from w/c at sink.  Pt performed squat pivot transfers bed->w/c and w/c<>shower seat, requiring max A.  Pt required mod instructional cues to initiate all bathing tasks except washing hair.  Pt indicated he wanted to wash his hair first and initiated and completed task without assistance.  Pt required assistance to complete bathing tasks to assure thoroughness.  Pt required mod verbal cues to attend to task.  Pt was able to don his pullover shirt without assistance.  Pt initiated threading BLE into shorts but required assistance to complete task.  Pt completed grooming tasks seated in w/c at sink. Focus on activity tolerance, sit<>stand, functional transfers, task initiation, sequencing, and safety awareness.  Therapy Documentation Precautions:  Precautions Precautions: Fall Precaution Comments: BLE NWB; trach, peg Required Braces or Orthoses: Other Brace/Splint Other Brace/Splint B PRAFO Restrictions Weight Bearing Restrictions: Yes RLE Weight Bearing: Weight bearing as tolerated LLE Weight  Bearing: Weight bearing as tolerated Other Position/Activity Restrictions: 10/21 now WBAT through BLE and no ROM restrictions Pain: Pain Assessment Pain Assessment: No/denies pain ADL: ADL ADL Comments: see FIM  See Function Navigator for Current Functional Status.   Therapy/Group: Individual Therapy  Rich BraveLanier, [name] 03/12/2015, 8:03 AM

## 2015-03-12 NOTE — Progress Notes (Signed)
Speech Language Pathology Daily Session Note  Patient Details  Name: Juan [name] MRN: [id number] Date of Birth: 12/28/1990  Today's Date: 03/12/2015 SLP Individual Time: 0930-1030 SLP Individual Time Calculation (min): 60 min  Short Term Goals: Week 4: SLP Short Term Goal 1 (Week 4): Patient will sustain attention to a task for 5 minutes with Max A multimodal cues.  SLP Short Term Goal 2 (Week 4): Patient will initiate functional tasks in 90% of opportunities with Mod A verbal cues.  SLP Short Term Goal 3 (Week 4): Patient will vocalize with Max A multimodal cues in 25% of opportunities  SLP Short Term Goal 4 (Week 4): Patient will express basic wants/needs in 90% of opportunities with Mod A multimodal cues.  SLP Short Term Goal 5 (Week 4): Patient will dys 2 textures and thin liquids with minimal overt s/s of aspiration with Mod A verbal cues for use of swallowing compensatory strategies.   Skilled Therapeutic Interventions: Skilled treatment session focused on addressing dysphagia and cognition goals. SLP facilitated session by providing set-up assist of trials of regular textures and thin liquids, which patient consumed large portions of at a fast pace with a prolonged oral phase but no overt s/s of aspiration.  SLP and dad provided Mod verbal and visual cues for a slower pace to maximize safety.  Recommend diet advancement to Dys.3, soft solids with thin liquids and continued full supervision to ensure small portions and a slow pace for overall safety with PO.  SLP also facilitated session with a basic money management task which patient completed with Max faded to Mod assist verbal and visual cues to sort coins to a field of 4 and Max assist verbal and written cues to add coins accurately.  Patient able to accurately state denomination of each coin upon initial trial; however, confusion versus perseveration impacted accuracy during task.  Patient's verbal expression remains dysphonic, with  minimal output.     Function:  Cognition Comprehension Comprehension assist level: Understands basic 25 - 49% of the time/ requires cueing 50 - 75% of the time  Expression   Expression assist level: Expresses basic 25 - 49% of the time/requires cueing 50 - 75% of the time. Uses single words/gestures.  Social Interaction Social Interaction assist level: Interacts appropriately 25 - 49% of time - Needs frequent redirection.  Problem Solving Problem solving assist level: Solves basic less than 25% of the time - needs direction nearly all the time or does not effectively solve problems and may need a restraint for safety  Memory Memory assist level: Recognizes or recalls 25 - 49% of the time/requires cueing 50 - 75% of the time    Pain Pain Assessment Pain Assessment: No/denies pain  Therapy/Group: Individual Therapy  Charlane FerrettiMelissa Nijah Tejera, M.A., CCC-SLP [phone]  Kariann Wecker 03/12/2015, 12:46 PM

## 2015-03-12 NOTE — Progress Notes (Signed)
Physical Therapy Session Note  Patient Details  Name: Juan [name] XXXHunt MRN: [id number] Date of Birth: 10/21/1990  Today'[name] Date: 03/12/2015 PT Individual Time: 1100-1200 PT Individual Time Calculation (min): 60 min   Short Term Goals: Week 4:  PT Short Term Goal 1 (Week 4): Patient will perform slide board transfers with min A and 75% cues for sequencing and technique.  PT Short Term Goal 2 (Week 4): Patient will propel wheelchair using BUE in straight path x 50 ft with supervision and max cues.  PT Short Term Goal 3 (Week 4): Patient will sustain attention to functional tasks x 15 min with min cues.  Skilled Therapeutic Interventions/Progress Updates:   Pt received in w/c with family present to observe.  Continued gait and NMR in // bars; see below.  Also performed w/c mobility with LE propulsion for LE strengthening and activation x 50' with min A and cues for sequencing.  During gait training reviewed car transfer with pt initiating stepping into car and requiring +2 for safety and mod-max verbal cues for positioning once in car.  Returned to room and pt left in w/c with family present and one time use heating pads applied to R thigh and L lower LE for pain management.  Will review gentle ROM/stretching program with family tomorrow.    Therapy Documentation Precautions:  Precautions Precautions: Fall Precaution Comments: BLE NWB; trach, peg Required Braces or Orthoses: Other Brace/Splint Other Brace/Splint: B resting hand splints; B PRAFO Restrictions Weight Bearing Restrictions: Yes RLE Weight Bearing: Weight bearing as tolerated LLE Weight Bearing: Weight bearing as tolerated Other Position/Activity Restrictions: 10/21 now WBAT through BLE and no ROM restrictions Vital Signs: Therapy Vitals Pulse Rate: 91 BP: 129/75 mmHg Pain: Pain Assessment Pain Assessment: 0-10 Pain Score: 5  Pain Type: Acute pain Pain Location: Leg Pain Orientation: Right Pain Descriptors / Indicators:  Sore Pain Onset: Gradual Pain Intervention([name]): RN made aware;Heat applied;Rest Other Treatments: Treatments Neuromuscular Facilitation: Right;Left;Lower Extremity;Forced use;Activity to increase coordination;Activity to increase motor control;Activity to increase timing and sequencing;Activity to increase sustained activation;Activity to increase lateral weight shifting;Activity to increase anterior-posterior weight shifting beginning with gait in // bars and progressing to gait x 75' +100' +200' with 3 musketeer style to facilitate upright trunk posture, lateral weight shifting and automatic stepping sequence.  Also performed stair negotiation up/down 12 stairs x 2 reps with one UE support on rail and therapist under other UE with +2 needed for trunk control, LE advancement, weight shifting and sequencing.  Pt tolerated well and very motivated to continue but demonstrating antalgic gait and pain in RLE with prolonged ambulation.  Pt still demonstrating increased flexed posture and increased WB through UE to maintain trunk control/balance.    See Function Navigator for Current Functional Status.   Therapy/Group: Individual Therapy  Edman CircleHall, Mina Babula [facility] 03/12/2015, 12:35 PM

## 2015-03-13 ENCOUNTER — Inpatient Hospital Stay (HOSPITAL_COMMUNITY): Payer: 59 | Admitting: Speech Pathology

## 2015-03-13 ENCOUNTER — Inpatient Hospital Stay (HOSPITAL_COMMUNITY): Payer: 59 | Admitting: Physical Therapy

## 2015-03-13 ENCOUNTER — Inpatient Hospital Stay (HOSPITAL_COMMUNITY): Payer: 59 | Admitting: *Deleted
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
## 2015-03-13 NOTE — Progress Notes (Signed)
Physical Therapy Session Note  Patient Details  Name: Juan [name] MRN: [id number] Date of Birth: 09/24/1990  Today's Date: 03/13/2015 PT Individual Time: 1415-1530 PT Individual Time Calculation (min): 75 min   Short Term Goals: Week 4:  PT Short Term Goal 1 (Week 4): Patient will perform slide board transfers with min A and 75% cues for sequencing and technique.  PT Short Term Goal 2 (Week 4): Patient will propel wheelchair using BUE in straight path x 50 ft with supervision and max cues.  PT Short Term Goal 3 (Week 4): Patient will sustain attention to functional tasks x 15 min with min cues.  Skilled Therapeutic Interventions/Progress Updates:   Session focused on progression of functional ambulation, muscle extensibility, transfers, sitting balance, coordination, command following, postural control, and activity tolerance. Patient in wheelchair with mother, grandmother, and father present for session, handoff from SLP. Gait with B HHA x 2 persons x 50 ft progressed to use of RW x 250 ft in straight path with 5 seated rest breaks, therapist facilitating R pelvic anterior rotation to neutral and rehab tech controlling RW with max multimodal cues for increased RLE clearance, upright posture, and forward gaze. Patient with no c/o pain but report of BLE fatigue as limiting factor. Performed squat pivot transfer with supervision and max multimodal cues for sequencing. During supine rest break, performed BLE PROM: hamstrings, hip abductors, hip IR/ER, heel cords, x 30-60 sec each stretch, patient required max multimodal cues for verbalizing feeling stretch without becoming aggravated due to pain. Patient demonstrated significantly limited L hamstring length with decreased tolerance to stretching and maintains BLE externally rotated. Sitting balance edge of mat on raised table with feet unsupported, ball kicks x 10 each LE, max cues to count out loud. Progressed to chest passes/bounce passes with  basketball with attempted dual task naming categories. Patient either perseverated on previous response or stated "NO," or "NO WAY," about previous person's response despite max multimodal cues for adhering to task instructions. Patient performed NuStep initially using BUE/BLE at level 6 x 1 min at rapid pace, when challenge increased to BLE only at level 1, patient required therapist to manually prevent use of BUE with max multimodal cues for sequencing and coordination at much slower pace x 2 min. Patient left sitting in wheelchair with quick release belt on with family to return to room.   Therapy Documentation Precautions:  Precautions Precautions: Fall Precaution Comments: BLE NWB; trach, peg Required Braces or Orthoses: Other Brace/Splint Other Brace/Splint: B resting hand splints; B PRAFO Restrictions Weight Bearing Restrictions: Yes RLE Weight Bearing: Weight bearing as tolerated LLE Weight Bearing: Weight bearing as tolerated Other Position/Activity Restrictions: 10/21 now WBAT through BLE and no ROM restrictions Pain: Pain Assessment Pain Assessment: No/denies pain   See Function Navigator for Current Functional Status.   Therapy/Group: Individual Therapy  Kerney ElbeVarner, [name] 03/13/2015, 5:43 PM

## 2015-03-13 NOTE — Progress Notes (Signed)
Occupational Therapy Session Note  Patient Details  Name: Juan [name] MRN: [id number] Date of Birth: 07/09/1990  Today's Date: 03/13/2015 OT Individual Time: 0900-1000 OT Individual Time Calculation (min): 60 min    Short Term Goals: Week 4:  OT Short Term Goal 1 (Week 4): Pt will perform shower transfers with max A - squat pivot OT Short Term Goal 2 (Week 4): Pt will perform BSC transfers with max A - squat pivot OT Short Term Goal 3 (Week 4): Pt will perform LB dressing tasks with max A OT Short Term Goal 4 (Week 4): Pt will perform toileting tasks with max A  Skilled Therapeutic Interventions/Progress Updates:    Pt resting in w/c with mother present upon arrival.  Pt's mom stated that pt was already bathed and dressed and ready for therapy. Pt informed therapist that he wanted to walk this morning and engaged in functional amb + 2 to sit EOM and engaged in BUE activities with pipe tree.  Pt used BUEs to select appropriate pipe pieces but exhibited significant perceptual difficulties with placing pipes in appropriate place.  Pt also engaged in standing tasks in parallel bars to facilitate use of UE to retrieve items in preparation for standing to pull up pants.  Focus on activity tolerance, sit<>stand, functional amb, standing balance, BUE use for functional tasks, and safety awareness.  Therapy Documentation Precautions:  Precautions Precautions: Fall Precaution Comments: BLE NWB; trach, peg Required Braces or Orthoses: Other Brace/Splint Other Brace/Splint: B resting hand splints; B PRAFO Restrictions Weight Bearing Restrictions: Yes RLE Weight Bearing: Weight bearing as tolerated LLE Weight Bearing: Weight bearing as tolerated Other Position/Activity Restrictions: 10/21 now WBAT through BLE and no ROM restrictions Pain: Pain Assessment Pt denied pain ADL: ADL ADL Comments: see FIM  See Function Navigator for Current Functional Status.   Therapy/Group: Individual  Therapy  Rich BraveLanier, Alroy Portela Chappell 03/13/2015, 11:02 AM
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
## 2015-03-13 NOTE — Progress Notes (Signed)
Speech Language Pathology Weekly Progress and Session Note  Patient Details  Name: Juan [name] MRN: [id number] Date of Birth: 1991-02-12  Beginning of progress report period: March 06, 2015 End of progress report period: March 13, 2015  Today's Date: 03/13/2015 SLP Individual Time: 1315-1415 SLP Individual Time Calculation (min): 60 min  Short Term Goals: Week 4: SLP Short Term Goal 1 (Week 4): Patient will sustain attention to a task for 5 minutes with Max A multimodal cues.  SLP Short Term Goal 1 - Progress (Week 4): Met SLP Short Term Goal 2 (Week 4): Patient will initiate functional tasks in 90% of opportunities with Mod A verbal cues.  SLP Short Term Goal 2 - Progress (Week 4): Met SLP Short Term Goal 3 (Week 4): Patient will vocalize with Max A multimodal cues in 25% of opportunities  SLP Short Term Goal 3 - Progress (Week 4): Not met SLP Short Term Goal 4 (Week 4): Patient will express basic wants/needs in 90% of opportunities with Mod A multimodal cues.  SLP Short Term Goal 4 - Progress (Week 4): Met SLP Short Term Goal 5 (Week 4): Patient will dys 2 textures and thin liquids with minimal overt s/s of aspiration with Mod A verbal cues for use of swallowing compensatory strategies.  SLP Short Term Goal 5 - Progress (Week 4): Met    New Short Term Goals: Week 5: SLP Short Term Goal 1 (Week 5): Patient will sustain attention to a task for 10 minutes with Max A multimodal cues.  SLP Short Term Goal 2 (Week 5): Patient will demonstrate orientation to time with Max A multimodal cues.  SLP Short Term Goal 3 (Week 5): Patient will identify 1 physical and 1 cognitive deficit with Max A multimodal cues.  SLP Short Term Goal 4 (Week 5): Patient will consume current diet with minimal overt s/s of aspiration with Mod A multimodal cues for use of strategies.  SLP Short Term Goal 5 (Week 5): Patient will consume trials of regular textures and demonstrate efficient mastication without  overt s/s of aspiration over 2 consecutive sesions with Mod A multimodal cues.  SLP Short Term Goal 6 (Week 5): Patient will vocalize with Max A multimodal cues in 25% of opportunities   Weekly Progress Updates: Patient has made functional gains and has met 4 of 5 STG's this reporting period due to increased cognitive-linguistic and swallowing function. Patient is consuming Dys. 3 textures with thin liquids without overt s/s of aspiration and requires Max A multimodal cues for use of swallowing compensatory strategies. Patient is verbalizing at the phrase and sentence level in 90% of opportunities but remains aphonic, suspect due to cognitive impairments.  Patient is initiating functional tasks and following 1-step commands in 90% of opportunities with extra time and Min A multimodal cues. Patient can also sustain attention to functional tasks for 5 minutes with Mod A multimodal cues but requires total A for orientation to time and intellectual awareness of deficits. Family education is ongoing. Patient would benefit from continued skilled SLP intervention to maximize swallowing and cognitive function and functional communication in order to maximize functional independence and reduce caregiver burden prior to discharge. Continue with current plan of care.   Intensity: Minumum of 1-2 x/day, 30 to 90 minutes Frequency: 3 to 5 out of 7 days Duration/Length of Stay: 03/20/15 Treatment/Interventions: English as a second language teacher;Dysphagia/aspiration precaution training;Environmental controls;Functional tasks;Therapeutic Activities;Patient/family education;Cognitive remediation/compensation;Internal/external aids;Speech/Language facilitation   Daily Session  Skilled Therapeutic Interventions: Skilled treatment session focused on dysphagia and  cognitive goals. SLP facilitated session by providing Mod A multimodal cues for use of small bites and a slow rate of self-feeding during lunch meal of Dys. 3 textures with thin  liquids but did not demonstrate any overt s/s of aspiration. Patient's mother present and required verbal cues for increased cueing for patient to utilize small bites throughout the meal. Patient demonstrated selective attention throughout meal for 30 minutes with Min A multimodal cues and required total A multimodal cues for orientation to time. Patient also required Max A for immediate recall of 5 words and total A for delayed recall. Patient and his mom educated in regards to his current cognitive impairments and strategies to utilize at home to maximize carryover of information and recall. Patient handed off to PT. Continue with current plan of care.       Function:   Eating Eating   Modified Consistency Diet: Yes Eating Assist Level: More than reasonable amount of time;Supervision or verbal cues;Set up assist for   Eating Set Up Assist For: Opening containers       Cognition Comprehension Comprehension assist level: Understands basic 50 - 74% of the time/ requires cueing 25 - 49% of the time  Expression   Expression assist level: Expresses basic 50 - 74% of the time/requires cueing 25 - 49% of the time. Needs to repeat parts of sentences.  Social Interaction Social Interaction assist level: Interacts appropriately 50 - 74% of the time - May be physically or verbally inappropriate.  Problem Solving Problem solving assist level: Solves basic less than 25% of the time - needs direction nearly all the time or does not effectively solve problems and may need a restraint for safety  Memory Memory assist level: Recognizes or recalls 25 - 49% of the time/requires cueing 50 - 75% of the time   Pain Pain Assessment Pain Assessment: 0-10 Pain Score: 0-No pain  Therapy/Group: Individual Therapy  Estie Sproule 03/13/2015, 4:46 PM

## 2015-03-13 NOTE — Progress Notes (Signed)
Physical Therapy Weekly Progress Note  Patient Details  Name: Juan [name] MRN: [id number] Date of Birth: 1990/12/29  Beginning of progress report period: March 08, 2015 End of progress report period: March 14, 2015  Today's Date: 03/14/2015 PT Individual Time: 1100-1200 PT Individual Time Calculation (min): 60 min   Patient has met 1 of 3 short term goals, however, current short term goals do not provide accurate picture of patient progress since last reporting period as patient was upgraded to [place] after STGs were established. Patient has demonstrated excellent functional progress with upgrade in weightbearing status, including ambulation using RW and stair training with  Min-max A to +2A, sit <> stand with min A using RW, transfers with S-min A. Patient is initiating functional tasks and following 1-step commands in 90% of opportunities with extra time and min multimodal cues, sustaining attention to functional tasks for 5 minutes with Mod A multimodal cues, and total A for orientation to time and intellectual awareness of deficits. Patient and family education ongoing. Plan to perform car transfer to mother's car tomorrow.   Patient continues to demonstrate the following deficits: muscle weakness, muscle joint tightness, decreased endurance, impaired timing and sequencing, abnormal tone, unbalanced muscle activation, motor apraxia, decreased coordination, decreased motor planning, decreased midline orientation, decreased initiation, decreased attention, decreased awareness, decreased problem solving, decreased safety awareness, decreased memory, delayed processing, decreased sitting/standing balance, decreased postural control, decreased balance strategies and therefore will continue to benefit from skilled PT intervention to enhance overall performance with activity tolerance, balance, postural control, ability to compensate for deficits, functional use of  right upper extremity, right  lower extremity, left upper extremity and left lower extremity, attention, awareness and coordination.  Patient progressing toward long term goals.  Plan of care revisions: Goals upgraded to S-min A overall and ambulation and stair goal added. Downgraded wheelchair goal to min A due to slow progress. .  PT Short Term Goals Week 4:  PT Short Term Goal 1 (Week 4): Patient will perform slide board transfers with min A and 75% cues for sequencing and technique.  PT Short Term Goal 1 - Progress (Week 4): Met PT Short Term Goal 2 (Week 4): Patient will propel wheelchair using BUE in straight path x 50 ft with supervision and max cues.  PT Short Term Goal 2 - Progress (Week 4): Not met PT Short Term Goal 3 (Week 4): Patient will sustain attention to functional tasks x 15 min with min cues. PT Short Term Goal 3 - Progress (Week 4): Not met Week 5:  PT Short Term Goal 1 (Week 5): = LTGs due to anticipated LOS  Skilled Therapeutic Interventions/Progress Updates:   Patient received in manual wheelchair, propelled using BUE x 150 ft with supervision and max verbal/visual/tactile cues for steering, turning, obstacle negotiation, and straight path. Patient would run into walls but then eventually would be able to problem solve with cuing how to navigate. Gait using RW with min A overall of one person and up to max A for turning and backing up to surface before sitting, 2 x 30 ft + 60 ft. Stair training up 8 (3") steps and down 4 (6") steps using 2 rails with min A ascending and up to max A descending with max multimodal cues for sequencing, alignment, upright posture, and advancing BUE along rails. Patient very fatigued and had difficulty keeping his eyes open at end of session. Required max multimodal cues to propel wheelchair back to room using BLE only, forwards  and backwards with min A overall. Performed squat pivot transfer from manual wheelchair back to TIS wheelchair with mod A for safety and to clear hips  over cushion. Patient left in [place] wheelchair with mom present.   Therapy Documentation Precautions:  Precautions Precautions: Fall Precaution Comments: BLE NWB; trach, peg Required Braces or Orthoses: Other Brace/Splint Other Brace/Splint: B resting hand splints; B PRAFO Restrictions Weight Bearing Restrictions: No RLE Weight Bearing: Weight bearing as tolerated LLE Weight Bearing: Weight bearing as tolerated Other Position/Activity Restrictions: 10/21 now WBAT through BLE and no ROM restrictions Pain: Pain Assessment Pain Assessment: No/denies pain Pain Score: 0-No pain  See Function Navigator for Current Functional Status.  Therapy/Group: Individual Therapy  Laretta Alstrom 03/14/2015, 12:39 PM

## 2015-03-13 NOTE — Plan of Care (Signed)
Problem: RH BLADDER ELIMINATION Goal: RH STG MANAGE BLADDER WITH ASSISTANCE STG Manage Bladder With moderate Assistance  Outcome: Progressing Needs cues/reminders for urinating; still retaining some urine.

## 2015-03-14 ENCOUNTER — Inpatient Hospital Stay (HOSPITAL_COMMUNITY): Payer: 59 | Admitting: Occupational Therapy

## 2015-03-14 ENCOUNTER — Inpatient Hospital Stay (HOSPITAL_COMMUNITY): Payer: 59 | Admitting: Speech Pathology

## 2015-03-14 ENCOUNTER — Inpatient Hospital Stay (HOSPITAL_COMMUNITY): Payer: 59 | Admitting: *Deleted

## 2015-03-14 ENCOUNTER — Inpatient Hospital Stay (HOSPITAL_COMMUNITY): Payer: 59 | Admitting: Physical Therapy

## 2015-03-14 MED ORDER — PROPRANOLOL HCL 20 MG PO TABS
40.0000 mg | ORAL_TABLET | Freq: Three times a day (TID) | ORAL | Status: DC
  Administered 2015-03-14 – 2015-03-20 (×17): 40 mg via ORAL
  Filled 2015-03-14 (×18): qty 2

## 2015-03-14 NOTE — Progress Notes (Addendum)
[facility]IC[facility] & REHABILITATION     PROGRESS NOTE    Subjective/Complaints:  Pt seen this AM sitting up in bed.  Pt with low HR this AM, but is asymptomatic.    ROS --limited due to cognitive status. Appears to deny leg pain, foot pain  Objective: Vital Signs: Blood pressure 119/69, pulse 81, temperature 97 F (36.1 C), temperature source Oral, resp. rate 17, height  (1.676 m), weight 64.6 kg (142 lb 6.7 oz), SpO2 100 %. No results found. No results for input(s): WBC, HGB, HCT, PLT in the last 72 hours. No results for input(s): NA, K, CL, GLUCOSE, BUN, CREATININE, CALCIUM in the last 72 hours.  Invalid input(s): CO CBG (last 3)  No results for input(s): GLUCAP in the last 72 hours.  Wt Readings from Last 3 Encounters:  03/14/15 64.6 kg (142 lb 6.7 oz)  02/12/15 73.936 kg (163 lb)  07/31/12 63.504 kg (140 lb)    Physical Exam:  Constitutional: He appears well-developed and well-nourished.  Alert.  HENT:  Head: Normocephalic and atraumatic.  Right Ear: External ear normal.  Left Ear: External ear normal.  Eyes: Right eye exhibits no discharge. Left eye exhibits no discharge. No scleral icterus. EOMI.  Neck: Normal range of motion. Neck supple. No JVD present. No tracheal deviation present. No thyromegaly present. Hypergranulation over closed stoma--associated drainage Cardiovascular: Normal rate and regular rhythm.  Respiratory: Effort normal and breath sounds normal. No respiratory distress. He has no wheezes. no rales GI: Soft. Bowel sounds are normal. He exhibits no distension. There is no tenderness. There is no rebound and no guarding.  PEG area clean/dry.   Musculoskeletal: He exhibits no edema. No pain with range of motion of the knee or ankle, moderate tenderness with palpation of the tibia on the left side, no knee joint swelling, no ankle joint swelling Neurological: speaks, usually in a whisper, occasionally phonates partially.  Moves all  extremities.   Skin:  Numerous surgical scars on both LE's, healed.  Neuro: follows one step commands more quickly. Sitting posture and head position improving.  Psychiatric:  Flat affect  Assessment/Plan: 1. 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 Physiatrist is providing close team supervision and 24 hour management of active medical problems listed below. Physiatrist and rehab team continue to assess barriers to discharge/monitor patient progress toward functional and medical goals.  Function:  Bathing Bathing position Bathing activity did not occur: N/A (Night Bath) Position: Bed  Bathing parts Body parts bathed by patient: Chest, Front perineal area, Abdomen, Right upper leg, Left upper leg, Left arm Body parts bathed by helper: Right arm, Buttocks, Right lower leg, Left lower leg, Back  Bathing assist Assist Level: Touching or steadying assistance(Pt > 75%)      Upper Body Dressing/Undressing Upper body dressing   What is the patient wearing?: Pull over shirt/dress     Pull over shirt/dress - Perfomed by patient: Thread/unthread right sleeve, Thread/unthread left sleeve, Put head through opening, Pull shirt over trunk Pull over shirt/dress - Perfomed by helper: Put head through opening, Pull shirt over trunk        Upper body assist Assist Level: Set up, Supervision or verbal cues   Set up : To obtain clothing/put away  Lower Body Dressing/Undressing Lower body dressing Lower body dressing/undressing activity did not occur: N/A (diaper) What is the patient wearing?: Underwear, Pants, Socks, Shoes  Underwear - Performed by helper: Thread/unthread right underwear leg, Thread/unthread left underwear leg, Pull underwear up/down   Pants- Performed by helper: Thread/unthread left pants leg   Non-skid slipper socks- Performed by helper: Don/doff right sock,  Don/doff left sock   Socks - Performed by helper: Don/doff right sock, Don/doff left sock   Shoes - Performed by helper: Don/doff right shoe, Don/doff left shoe, Fasten right, Fasten left          Lower body assist Assist for lower body dressing: Touching or steadying assistance (Pt > 75%)      Toileting Toileting Toileting activity did not occur: N/A   Toileting steps completed by helper: Adjust clothing prior to toileting, Performs perineal hygiene, Adjust clothing after toileting Toileting Assistive Devices: Grab bar or rail  Toileting assist Assist level: Two helpers   Transfers Chair/bed transfer   Chair/bed transfer method: Lateral scoot Chair/bed transfer assist level: Touching or steadying assistance (Pt > 75%) Chair/bed transfer assistive device: Armrests Mechanical lift: Maximove   Locomotion Ambulation Ambulation activity did not occur: Safety/medical concerns (NWB bilateral lower extremities)   Max distance: 50 Assist level: 2 helpers   Education administratorWheelchair Wheelchair activity did not occur: Safety/medical concerns (low level TBI) Type: Manual Max wheelchair distance: 50 Assist Level: Touching or steadying assistance (Pt > 75%)  Cognition Comprehension Comprehension assist level: Understands basic 50 - 74% of the time/ requires cueing 25 - 49% of the time  Expression Expression assist level: Expresses basic 50 - 74% of the time/requires cueing 25 - 49% of the time. Needs to repeat parts of sentences.  Social Interaction Social Interaction assist level: Interacts appropriately 50 - 74% of the time - May be physically or verbally inappropriate.  Problem Solving Problem solving assist level: Solves basic less than 25% of the time - needs direction nearly all the time or does not effectively solve problems and may need a restraint for safety  Memory Memory assist level: Recognizes or recalls 25 - 49% of the time/requires cueing 50 - 75% of the time   Medical Problem List and  Plan: 1. 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  -Recommend followup with physician with experience with TBI in [place], in addition to PCP at discharge.  2. DVT Prophylaxis/Anticoagulation: Subcutaneous Lovenox as well as IVC filter 01/12/2015---still indicated 3. Pain Management: have weaned off a lot of pain meds.    -increasing pain complaints likely due more awareness and coming off fentanyl patch.   - continue tramadol 100 mg every 8 hours monitor for sedation,MS Contin 15 mg morning to help with daytime pain 4. Mood/behavior/cognition: ritalin 15mg  to improve attention/initiation further.    -propranolol weaned to 40mg  TID on 10/26.  Weaning klonopin due to neurosedating effects.  -sleep well  -seroquel 75mg -- d/c 10/26 after wean.    -continue sleep chart  -family at bedside for safety reasons---otherwise this patient would require some sort of restraining 5. Neuropsych: This patient is not capable of making decisions on his own behalf. 6. Skin/Wound Care: Routine skin checks continue 7. Fluids/Electrolytes/Nutrition:   -K+ increased due to hemolysis. The remainders of labs normal  - Recent labs stable 8. Uncontrollable rhythmic movements/spasticity. Improved. Likely related to distractibility, also on baclofen  - Continue Vimpat   -Started tapering Keppra to 750 BID on 10/19, weaned to 250 BID and then d/c on 10/28.   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 Dysphagia: now on D1/thins  -Gastrostomy tube 01/18/2015   -off bolus feeds now, needs to push po  -dc supps and h20 boluses  -PEG/ foley d/ced 10/20 without complications  -all meds changed to oral form   11. Large tension pneumothorax on the right with multiple rib  fractures/pulmonary  -?LLL pneumonia---CXR resolved.  -aspiration precautions  -trach stoma closing 12. Multiple orthopedic fractures, L1-2 transverse process fractures,/left femur fracture with IM nailing, left and right open tibia fibular fractures, transverse acetabulum fracture status post percutaneous pinning,CM pelvic ring status post ORIF, pubic symphysis and trans-sacral screw forL S I diastasis and right sacralalo fracture. Follow-up x-ray personally reviewed, which shows healing of fibula >tibia.  Ortho reevaluated pt and he is now WBAT b/l LE.  13. Urine retention/neurogenic bladder: flomax trial, urecholine trial  -spontaneous voiding improved. pvr's slowly decreasing, no I/O cath since yesterday  -timed voiding to continue to improve continence   LOS (Days) 29 A FACE TO FACE EVALUATION WAS PERFORMED  Ankit Karis Juba 03/14/2015 9:33 AM

## 2015-03-14 NOTE — Progress Notes (Signed)
Patient voided in the urinal & also had soaked diaper this morning.  Mother stated patient usually doesn't go (void) during the night. Further stated " he's going home on Tuesday & we don't want him started on that catheter again". Patient slept most of the night, family @ bedside very supportive with care.

## 2015-03-14 NOTE — Progress Notes (Signed)
Speech Language Pathology Daily Session Note  Patient Details  Name: Juan [name] MRN: [id number] Date of Birth: 03/08/1991  Today's Date: 03/14/2015 SLP Individual Time: 1400-1500 SLP Individual Time Calculation (min): 60 min  Short Term Goals: Week 5: SLP Short Term Goal 1 (Week 5): Patient will sustain attention to a task for 10 minutes with Max A multimodal cues.  SLP Short Term Goal 2 (Week 5): Patient will demonstrate orientation to time with Max A multimodal cues.  SLP Short Term Goal 3 (Week 5): Patient will identify 1 physical and 1 cognitive deficit with Max A multimodal cues.  SLP Short Term Goal 4 (Week 5): Patient will consume current diet with minimal overt s/s of aspiration with Mod A multimodal cues for use of strategies.  SLP Short Term Goal 5 (Week 5): Patient will consume trials of regular textures and demonstrate efficient mastication without overt s/s of aspiration over 2 consecutive sesions with Mod A multimodal cues.  SLP Short Term Goal 6 (Week 5): Patient will vocalize with Max A multimodal cues in 25% of opportunities   Skilled Therapeutic Interventions: Skilled treatment session focused on cognitive goals. SLP facilitated session by providing extra time and Max-Total A verbal and visual cues for functional problem solving during a basic money management task. SLP also facilitated session by providing Max A visual, verbal and question cues for decoding at the phrase and sentence level. Patient appeared to consistently demonstrate inattention to the left field of environment while reading with total A required for emergent awareness. Patient also unable to phonate despite Max A multimodal cues and attempts, suspect dysphonia is cognitive based, however, difficult to differentiate at this time. Patient left upright in wheelchair with family present. Continue with current plan of care.    Function:  Cognition Comprehension Comprehension assist level: Understands basic  75 - 89% of the time/ requires cueing 10 - 24% of the time  Expression   Expression assist level: Expresses basic 50 - 74% of the time/requires cueing 25 - 49% of the time. Needs to repeat parts of sentences.  Social Interaction Social Interaction assist level: Interacts appropriately 50 - 74% of the time - May be physically or verbally inappropriate.  Problem Solving Problem solving assist level: Solves basic less than 25% of the time - needs direction nearly all the time or does not effectively solve problems and may need a restraint for safety  Memory Memory assist level: Recognizes or recalls 25 - 49% of the time/requires cueing 50 - 75% of the time    Pain Pain Assessment Pain Assessment: No/denies pain   Therapy/Group: Individual Therapy  Juan [name] 03/14/2015, 4:03 PM

## 2015-03-14 NOTE — Progress Notes (Signed)
Occupational Therapy Session Note  Patient Details  Name: Juan [name] MRN: [id number] Date of Birth: 02/14/1991  Today's Date: 03/14/2015 OT Individual Time: 0800-0900 OT Individual Time Calculation (min): 60 min    Short Term Goals: Week 4:  OT Short Term Goal 1 (Week 4): Pt will perform shower transfers with max A - squat pivot OT Short Term Goal 2 (Week 4): Pt will perform BSC transfers with max A - squat pivot OT Short Term Goal 3 (Week 4): Pt will perform LB dressing tasks with max A OT Short Term Goal 4 (Week 4): Pt will perform toileting tasks with max A  Skilled Therapeutic Interventions/Progress Updates:    Pt resting in bed with Mom present upon arrival.  Pt stated that he wanted to walk.  Pt instructed to sit EOB in preparation for transfer to w/c.  Pt initiated moving BLE into correct position but required assistance to complete task secondary to continuing apraxia with bed mobility tasks.  Pt performed squat pivot transfer to w/c with max A.  Pt initially amb (+2) X 3 to sit on EOM and engaged in pipe tree tasks with focus on placing pipes in correct position to assemble structure similar to picture.  Pt continues to require hand over hand assistance to place pipes correctly.  Pt initiates task but is unable to determine correct placement.  Pt transitioned to standing tasks in parallel bars while reaching for objects with RUE and LUE to increase independence with LB dressing tasks.  Focus on standing balance, task initiation, sequencing, functional amb with HHA, following one step commands, and safety awareness.  Therapy Documentation Precautions:  Precautions Precautions: Fall Precaution Comments: BLE NWB; trach, peg Required Braces or Orthoses: Other Brace/Splint Other Brace/Splint: B resting hand splints; B PRAFO Restrictions Weight Bearing Restrictions: No RLE Weight Bearing: Weight bearing as tolerated LLE Weight Bearing: Weight bearing as tolerated Other  Position/Activity Restrictions: 10/21 now WBAT through BLE and no ROM restrictions Pain: Pain Assessment Pain Assessment: No/denies pain  See Function Navigator for Current Functional Status.   Therapy/Group: Individual Therapy  Rich BraveLanier, Tiago Humphrey Chappell 03/14/2015, 9:02 AM

## 2015-03-14 NOTE — Progress Notes (Signed)
Social Work Patient ID: Juan [name], male   DOB: 11/04/1990, 24 y.o.   MRN: [id number]   Have reviewed team conference with pt, mother and grandmother.  All very pleased with progress being made with mobility since WB restrictions were lifted on Friday.  Just received notification from insurance that they have extended coverage through to targeted d/c date of 03/20/15.  I am working with family to coordinate d/c referrals, DME, medical follow up, etc.  Family continues to be here daily and participating in all therapies.    Kadar Chance, LCSW

## 2015-03-14 NOTE — Progress Notes (Signed)
Occupational Therapy Session Note  Patient Details  Name: Juan [name] MRN: [id number] Date of Birth: 09/13/1990  Today's Date: 03/14/2015 OT Individual Time: 1530-1600 OT Individual Time Calculation (min): 30 min    Short Term Goals: Week 4:  OT Short Term Goal 1 (Week 4): Pt will perform shower transfers with max A - squat pivot OT Short Term Goal 2 (Week 4): Pt will perform BSC transfers with max A - squat pivot OT Short Term Goal 3 (Week 4): Pt will perform LB dressing tasks with max A OT Short Term Goal 4 (Week 4): Pt will perform toileting tasks with max A  Skilled Therapeutic Interventions/Progress Updates:  Upon entering the room, pt seated in wheelchair with family present in room. [facility] to decrease distraction. Pt verbalizing name,birthday,month, and location correctly. Pt utilized pen to write legible  first and last name with R hand. Pt engaged in game of tic tac toe. Pt unable to verbalize directions of game but did whisper , "I will be X". Pt initiated each turn but required mod verbal cues to turn blocks until X was displayed. Pt unable to problem solve strategy this game and needed hand over hand assistance to pick blocks that could be turned as game continued. Pt returned to room at end of session and performed stand pivot transfer with max A onto EOB. Pt performing sit >supine with steady assistance for safety. Family members remain present in room.   Therapy Documentation Precautions:  Precautions Precautions: Fall Precaution Comments: BLE NWB; trach, peg Required Braces or Orthoses: Other Brace/Splint Other Brace/Splint: B resting hand splints; B PRAFO Restrictions Weight Bearing Restrictions: No RLE Weight Bearing: Weight bearing as tolerated LLE Weight Bearing: Weight bearing as tolerated Other Position/Activity Restrictions: 10/21 now WBAT through BLE and no ROM restrictions General:   Vital Signs: Therapy  Vitals Temp: 98.7 F (37.1 C) Temp Source: Oral Pulse Rate: 86 Resp: 18 BP: 104/63 mmHg Patient Position (if appropriate): Sitting Oxygen Therapy SpO2: 100 % O2 Device: Not Delivered Pain: Pain Assessment Pain Assessment: No/denies pain Pain Intervention(s): Medication (See eMAR) (SCHEDULED) ADL: ADL ADL Comments: see FIM  See Function Navigator for Current Functional Status.   Therapy/Group: Individual Therapy  Lowella Gripittman, Jalan Bodi L 03/14/2015, 5:21 PM

## 2015-03-15 ENCOUNTER — Inpatient Hospital Stay (HOSPITAL_COMMUNITY): Payer: Self-pay | Admitting: *Deleted

## 2015-03-15 ENCOUNTER — Inpatient Hospital Stay (HOSPITAL_COMMUNITY): Payer: 59

## 2015-03-15 ENCOUNTER — Inpatient Hospital Stay (HOSPITAL_COMMUNITY): Payer: 59 | Admitting: Physical Therapy

## 2015-03-15 ENCOUNTER — Inpatient Hospital Stay (HOSPITAL_COMMUNITY): Payer: 59 | Admitting: Speech Pathology

## 2015-03-15 MED ORDER — TRAZODONE HCL 50 MG PO TABS: 50.0000 mg | ORAL_TABLET | Freq: Every evening | ORAL | Status: DC | PRN

## 2015-03-15 NOTE — Progress Notes (Signed)
Occupational Therapy Session Note  Patient Details  Name: Juan [name] MRN: [id number] Date of Birth: 07/25/1990  Today's Date: 03/15/2015 OT Individual Time: 0800-0900 OT Individual Time Calculation (min): 60 min    Short Term Goals: Week 4:  OT Short Term Goal 1 (Week 4): Pt will perform shower transfers with max A - squat pivot OT Short Term Goal 2 (Week 4): Pt will perform BSC transfers with max A - squat pivot OT Short Term Goal 3 (Week 4): Pt will perform LB dressing tasks with max A OT Short Term Goal 4 (Week 4): Pt will perform toileting tasks with max A  Skilled Therapeutic Interventions/Progress Updates:    Pt resting in bed upon arrival with girl friend present.  Pt stated he wanted to walk today.  Pt sat EOB this morning on right side.  Pt was able to perform supine->sit EOB with mod verbal cues and steady A.  Pt continues to exhibit apraxia wit BLE movements when performing this task.  Pt initiated threading BLE into shorts but required assistance with completion of task.  Pt required max A for sit<>stand and standing balance to facilitate pulling pants over hips.  Pt required max A for squat pivot transfer to w/c and propelled to sink to brush teeth.  Pt required min verbal cues for sequencing during task.  Pt transitioned to therapy gym and engaged in standing balance task with focus on use of unilateral UE for reaching task in preparation for standing to pull up pants.  Pt required max A for balance when reaching with UE.   Therapy Documentation Precautions:  Precautions Precautions: Fall Precaution Comments: BLE NWB; trach, peg Required Braces or Orthoses: Other Brace/Splint Other Brace/Splint: B resting hand splints; B PRAFO Restrictions Weight Bearing Restrictions: Yes RLE Weight Bearing: Weight bearing as tolerated LLE Weight Bearing: Weight bearing as tolerated Other Position/Activity Restrictions: 10/21 now WBAT through BLE and no ROM restrictions Pain: Pain  Assessment Pain Assessment: No/denies pain Pain Score: 0-No pain  See Function Navigator for Current Functional Status.   Therapy/Group: Individual Therapy  Rich BraveLanier, Jaise Moser Chappell 03/15/2015, 11:06 AM

## 2015-03-15 NOTE — Progress Notes (Signed)
Speech Language Pathology Daily Session Note  Patient Details  Name: Juan [name] XXXHunt MRN: [id number] Date of Birth: 05/17/1991  Today'[name] Date: 03/15/2015 SLP Individual Time: 0900-1000 SLP Individual Time Calculation (min): 60 min  Short Term Goals: Week 5: SLP Short Term Goal 1 (Week 5): Patient will sustain attention to a task for 10 minutes with Max A multimodal cues.  SLP Short Term Goal 2 (Week 5): Patient will demonstrate orientation to time with Max A multimodal cues.  SLP Short Term Goal 3 (Week 5): Patient will identify 1 physical and 1 cognitive deficit with Max A multimodal cues.  SLP Short Term Goal 4 (Week 5): Patient will consume current diet with minimal overt [name]/[name] of aspiration with Mod A multimodal cues for use of strategies.  SLP Short Term Goal 5 (Week 5): Patient will consume trials of regular textures and demonstrate efficient mastication without overt [name]/[name] of aspiration over 2 consecutive sesions with Mod A multimodal cues.  SLP Short Term Goal 6 (Week 5): Patient will vocalize with Max A multimodal cues in 25% of opportunities   Skilled Therapeutic Interventions: Skilled treatment session focused on dysphagia and cognitive goals. Upon arrival, patient was sitting upright in wheelchair but appeared lethargic and had not yet consumed his breakfast. SLP facilitated session by providing extra time and Mod A verbal and visual cues for initiation of self-feeding. Patient demonstrated selective attention to self-feeding in a mildly distracting environment for 5 minutes with Mod A verbal cues and required Max A verbal and question cues for problem solving with tray set-up. Patient consumed meal without overt [name]/[name] of aspiration and required Mod A verbal cues for use of swallowing compensatory strategies. Patient also required Max-Total A for problem solving during wheelchair propulsion back to his room. Patient was essentially nonverbal today with a flat affect, suspect overall function  was impacted by fatigue. Patient transferred back to bed at end of session. Patient left with girlfriend present. Continue with current plan of care.    Function:  Eating Eating   Modified Consistency Diet: Yes Eating Assist Level: More than reasonable amount of time;Supervision or verbal cues;Set up assist for   Eating Set Up Assist For: Opening containers       Cognition Comprehension Comprehension assist level: Understands basic 50 - 74% of the time/ requires cueing 25 - 49% of the time  Expression   Expression assist level: Expresses basic 50 - 74% of the time/requires cueing 25 - 49% of the time. Needs to repeat parts of sentences.  Social Interaction Social Interaction assist level: Interacts appropriately 25 - 49% of time - Needs frequent redirection.  Problem Solving Problem solving assist level: Solves basic less than 25% of the time - needs direction nearly all the time or does not effectively solve problems and may need a restraint for safety  Memory Memory assist level: Recognizes or recalls less than 25% of the time/requires cueing greater than 75% of the time    Pain Pain Assessment Pain Assessment: No/denies pain  Therapy/Group: Individual Therapy  Andres Escandon 03/15/2015, 10:43 AM

## 2015-03-15 NOTE — Progress Notes (Signed)
Occupational Therapy Note  Patient Details  Name: Juan [name] MRN: [id number] Date of Birth: 09/16/1990  Today's Date: 03/15/2015 OT Individual Time: 1300-1330 OT Individual Time Calculation (min): 30 min   Pt denied pain Individual Therapy  Pt initially engaged in cognitive remediation with focus on sustained attention, visual scanning to locate letters, and spelling name with letters.  Pt initiated tasks after objective described.  Pt selected the first two letter of his first name without verbal cues or assistance.  Pt required mod verbal cues to scan assortment of letters for last two letters.  Pt required max verbal cues to scan assortment of letters for last name and eventually required selecting letters from a field of two.  Pt perseverated on letters of first name and required verbal cues to state last name.  Pt transitioned to w/c mobility with focus on attention to environmental barriers and obstructions. Pt required mod verbal cues for correction of w/c path to avoid obstructions. Pt returned to room and remained in w/c with QRB in place and girlfriend present.  [name], Noheli Melder [facility] 03/15/2015, 1:51 PM

## 2015-03-15 NOTE — Progress Notes (Signed)
Physical Therapy Session Note  Patient Details  Name: Juan [name] MRN: [id number] Date of Birth: 06/09/1990  Today's Date: 03/15/2015 PT Individual Time: 1345-1430 PT Individual Time Calculation (min): 45 min   Short Term Goals: Week 5:  PT Short Term Goal 1 (Week 5): = LTGs due to anticipated LOS  Skilled Therapeutic Interventions/Progress Updates:   Session focused on hands-on family training with girlfriend for actual car transfer to her sedan. Patient propelled wheelchair x 200 ft with supervision, first 25% of distance requiring mod-max verbal/tactile cues for sequencing steering and obstacle negotiation progressed to no cues required and patient able to self correct and propel wheelchair in straight path. Performed car transfer x 1 with PT and x 2 with girlfriend. Short distance ambulation from wheelchair > car using RW with min A and up to max A for turning and backing up to car seat/wheelchair before attempting to sit and max multimodal cues for sequencing, technique, and assist for managing RW. Patient continues to demonstrate significant difficulty backing up to surfaces, especially with RLE and requires guidance to seat surface due to difficulty backing up enough to safely sit. Patient able to bring BLE in/out of car without difficulty and buckled seatbelt with assistance. Returned to rehab gym and in parallel bars worked on gait forwards and backwards with emphasis on weight shifting to L to Le[employer] Brothersunweight RLE and maintaining upright posture x 10 ft. Patient fatigued and requested to return to room, left in wheelchair with quick release belt on to return to room with girlfriend.   Therapy Documentation Precautions:  Precautions Precautions: Fall Precaution Comments: BLE NWB; trach, peg Required Braces or Orthoses: Other Brace/Splint Other Brace/Splint: B resting hand splints; B PRAFO Restrictions Weight Bearing Restrictions: Yes RLE Weight Bearing: Weight bearing as tolerated LLE  Weight Bearing: Weight bearing as tolerated Other Position/Activity Restrictions: 10/21 now WBAT through BLE and no ROM restrictions Pain: Pain Assessment Pain Assessment: No/denies pain   See Function Navigator for Current Functional Status.   Therapy/Group: Individual Therapy  Kerney ElbeVarner, Hokulani Rogel A 03/15/2015, 3:05 PM

## 2015-03-15 NOTE — Progress Notes (Signed)
[place] PHYSICAL MEDICINE & REHABILITATION     PROGRESS NOTE    Subjective/Complaints:  Family states he slept well, but he's having issues with energy this am. Delphina Cahill quite a bit yesterday. Family state he's felt "cold"  ROS --limited due to cognitive status. Appears to deny leg pain, foot pain  Objective: Vital Signs: Blood pressure 119/74, pulse 103, temperature 98.7 F (37.1 C), temperature source Oral, resp. rate 17, height  (1.676 m), weight 64.2 kg (141 lb 8.6 oz), SpO2 100 %. No results found. No results for input(s): WBC, HGB, HCT, PLT in the last 72 hours. No results for input(s): NA, K, CL, GLUCOSE, BUN, CREATININE, CALCIUM in the last 72 hours.  Invalid input(s): CO CBG (last 3)  No results for input(s): GLUCAP in the last 72 hours.  Wt Readings from Last 3 Encounters:  03/15/15 64.2 kg (141 lb 8.6 oz)  02/12/15 73.936 kg (163 lb)  07/31/12 63.504 kg (140 lb)    Physical Exam:  Constitutional: He appears well-developed and well-nourished.  Alert.  HENT:  Head: Normocephalic and atraumatic.  Right Ear: External ear normal.  Left Ear: External ear normal.  Eyes: Right eye exhibits no discharge. Left eye exhibits no discharge. No scleral icterus. EOMI.  Neck: Normal range of motion. Neck supple. No JVD present. No tracheal deviation present. No thyromegaly present. Hypergranulation over closed stoma--associated drainage Cardiovascular: Normal rate and regular rhythm.  Respiratory: Effort normal and breath sounds normal. No respiratory distress. He has no wheezes. no rales GI: Soft. Bowel sounds are normal. He exhibits no distension. There is no tenderness. There is no rebound and no guarding.  Prior PEG area clean/dry.   Musculoskeletal: He exhibits no edema. No pain with range of motion of the knee or ankle, moderate tenderness with palpation of the tibia on the left side, no knee joint swelling, no ankle joint swelling Neurological: speaks, usually in a  whisper, occasionally phonates partially.  Moves all extremities.   Skin:  Numerous surgical scars on both LE's, healed.  Neuro: follows one step commands more quickly. Sitting posture and head position improving.  Psychiatric:  Flat affect more dynamic  Assessment/Plan: 1. 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 Physiatrist is providing close team supervision and 24 hour management of active medical problems listed below. Physiatrist and rehab team continue to assess barriers to discharge/monitor patient progress toward functional and medical goals.  Function:  Bathing Bathing position Bathing activity did not occur: N/A (Night Bath) Position: Bed  Bathing parts Body parts bathed by patient: Chest, Front perineal area, Abdomen, Right upper leg, Left upper leg, Left arm Body parts bathed by helper: Right arm, Buttocks, Right lower leg, Left lower leg, Back  Bathing assist Assist Level: Touching or steadying assistance(Pt > 75%)      Upper Body Dressing/Undressing Upper body dressing   What is the patient wearing?: Pull over shirt/dress     Pull over shirt/dress - Perfomed by patient: Thread/unthread right sleeve, Thread/unthread left sleeve, Put head through opening, Pull shirt over trunk Pull over shirt/dress - Perfomed by helper: Put head through opening, Pull shirt over trunk        Upper body assist Assist Level: Set up, Supervision or verbal cues   Set up : To obtain clothing/put away  Lower Body Dressing/Undressing Lower body dressing Lower body dressing/undressing activity did not occur: N/A (diaper) What is the patient wearing?:  Underwear, Pants, Socks, Shoes   Underwear - Performed by helper: Thread/unthread right underwear leg, Thread/unthread left underwear leg, Pull underwear up/down   Pants- Performed by helper: Thread/unthread left pants leg    Non-skid slipper socks- Performed by helper: Don/doff right sock, Don/doff left sock   Socks - Performed by helper: Don/doff right sock, Don/doff left sock   Shoes - Performed by helper: Don/doff right shoe, Don/doff left shoe, Fasten right, Fasten left          Lower body assist Assist for lower body dressing: Touching or steadying assistance (Pt > 75%)      Toileting Toileting Toileting activity did not occur: N/A   Toileting steps completed by helper: Adjust clothing prior to toileting, Performs perineal hygiene, Adjust clothing after toileting Toileting Assistive Devices: Grab bar or rail  Toileting assist Assist level: Two helpers   Transfers Chair/bed transfer   Chair/bed transfer method: Stand pivot Chair/bed transfer assist level: Maximal assist (Pt 25 - 49%/lift and lower) Chair/bed transfer assistive device: Armrests Mechanical lift: Maximove   Locomotion Ambulation Ambulation activity did not occur: Safety/medical concerns (NWB bilateral lower extremities)   Max distance: 60 ft Assist level: Touching or steadying assistance (Pt > 75%)   Wheelchair Wheelchair activity did not occur: Safety/medical concerns (low level TBI) Type: Manual Max wheelchair distance: 150 ft Assist Level: Supervision or verbal cues  Cognition Comprehension Comprehension assist level: Understands basic 50 - 74% of the time/ requires cueing 25 - 49% of the time  Expression Expression assist level: Expresses basic 50 - 74% of the time/requires cueing 25 - 49% of the time. Needs to repeat parts of sentences.  Social Interaction Social Interaction assist level: Interacts appropriately 25 - 49% of time - Needs frequent redirection.  Problem Solving Problem solving assist level: Solves basic less than 25% of the time - needs direction nearly all the time or does not effectively solve problems and may need a restraint for safety  Memory Memory assist level: Recognizes or recalls less than 25% of  the time/requires cueing greater than 75% of the time   Medical Problem List and Plan: 1. 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  -discussed follow up with patient today. Will check with ortho before he goes home. 2. DVT Prophylaxis/Anticoagulation: Subcutaneous Lovenox as well as IVC filter 01/12/2015---still indicated 3. Pain Management:    - continue tramadol 100 mg every 8 hours monitor for sedation,  -dc'ed ms contin to help with sedation 4. Mood/behavior/cognition: ritalin  to improve attention/initiation further.    -propranolol weaned to  TID on 10/26.  Stopped klonopin today  -slept "well" last night per famiy, but i wonder if this was infact the case given day time fatigue today  -seroquel stopped. Will not resume, but added prn trazodone.    -continue sleep chart  -family at bedside for safety reasons---otherwise this patient would require some sort of restraining 5. Neuropsych: This patient is not capable of making decisions on his own behalf. 6. Skin/Wound Care: Routine skin checks continue 7. Fluids/Electrolytes/Nutrition:   -K+ increased due to hemolysis. The remainders of labs normal  - Recent labs stable 8. Uncontrollable rhythmic movements/spasticity. Improved. 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 Dysphagia: now on D1/thins  -Gastrostomy tube 01/18/2015   -off bolus feeds now, needs to push po  -dc supps and h20 boluses  -PEG/ foley d/ced 10/20 without complications  -all meds changed to oral form   11. Large tension pneumothorax on the right with multiple rib  fractures/pulmonary  -?LLL pneumonia---CXR resolved.  -aspiration precautions  -trach stoma closing 12. Multiple orthopedic fractures, L1-2 transverse process fractures,/left femur fracture with IM nailing, left and right open tibia fibular fractures, transverse acetabulum fracture status post percutaneous pinning,CM pelvic ring status post ORIF, pubic symphysis and trans-sacral screw forL S I diastasis and right sacralalo fracture. Follow-up x-ray personally reviewed, which shows healing of fibula >tibia.  Ortho reevaluated pt and he is now WBAT b/l LE--walking good dx's now!.  13. Urine retention/neurogenic bladder: flomax trial, urecholine trial  -spontaneous voiding improved. no I/O cath since 10/24  -timed voiding to continue to improve continence, has had incontinence at night   LOS (Days) 30 A FACE TO FACE EVALUATION WAS PERFORMED  SWARTZ,ZACHARY T 03/15/2015 2:31 PM

## 2015-03-16 ENCOUNTER — Inpatient Hospital Stay (HOSPITAL_COMMUNITY): Payer: Self-pay

## 2015-03-16 ENCOUNTER — Inpatient Hospital Stay (HOSPITAL_COMMUNITY): Payer: 59 | Admitting: Speech Pathology

## 2015-03-16 ENCOUNTER — Inpatient Hospital Stay (HOSPITAL_COMMUNITY): Payer: PRIVATE HEALTH INSURANCE

## 2015-03-16 ENCOUNTER — Inpatient Hospital Stay (HOSPITAL_COMMUNITY): Payer: 59 | Admitting: Physical Therapy

## 2015-03-16 MED ORDER — BETHANECHOL CHLORIDE 10 MG PO TABS
5.0000 mg | ORAL_TABLET | Freq: Three times a day (TID) | ORAL | Status: DC
  Administered 2015-03-16 – 2015-03-20 (×12): 5 mg via ORAL
  Filled 2015-03-16 (×12): qty 1

## 2015-03-16 NOTE — Progress Notes (Signed)
Speech Language Pathology Daily Session Note  Patient Details  Name: Juan [name] MRN: [id number] Date of Birth: 11/21/1990  Today's Date: 03/16/2015 SLP Individual Time: 1130-1200 SLP Individual Time Calculation (min): 30 min  Short Term Goals: Week 5: SLP Short Term Goal 1 (Week 5): Patient will sustain attention to a task for 10 minutes with Max A multimodal cues.  SLP Short Term Goal 2 (Week 5): Patient will demonstrate orientation to time with Max A multimodal cues.  SLP Short Term Goal 3 (Week 5): Patient will identify 1 physical and 1 cognitive deficit with Max A multimodal cues.  SLP Short Term Goal 4 (Week 5): Patient will consume current diet with minimal overt s/s of aspiration with Mod A multimodal cues for use of strategies.  SLP Short Term Goal 5 (Week 5): Patient will consume trials of regular textures and demonstrate efficient mastication without overt s/s of aspiration over 2 consecutive sesions with Mod A multimodal cues.  SLP Short Term Goal 6 (Week 5): Patient will vocalize with Max A multimodal cues in 25% of opportunities   Skilled Therapeutic Interventions: Skilled treatment session focused on addressing dysphagia goals. SLP facilitated session by providing set up assist along with girlfriend of regular textures and thin liquids via straw.  Patient self-fed with intermittent Min-Mod physical assist and despite taking large bites at a rapid pace, which required increased time for mastication and oral clearance, patient demonstrated no overt s/s of aspiration.  Recommend diet advancement to regular textures and thin lquids with continued full supervision with all PO.    Function:  Eating Eating   Modified Consistency Diet: No Eating Assist Level: More than reasonable amount of time;Supervision or verbal cues;Set up assist for;Helper checks for pocketed food;Helper scoops food on utensil;Helper brings food to mouth   Eating Set Up Assist For: Opening  containers;Cutting food Helper Scoops Food on Utensil: Occasionally Helper Brings Food to Mouth: Occasionally   Cognition Comprehension Comprehension assist level: Understands basic 50 - 74% of the time/ requires cueing 25 - 49% of the time  Expression   Expression assist level: Expresses basic 25 - 49% of the time/requires cueing 50 - 75% of the time. Uses single words/gestures.  Social Interaction Social Interaction assist level: Interacts appropriately 25 - 49% of time - Needs frequent redirection.  Problem Solving Problem solving assist level: Solves basic less than 25% of the time - needs direction nearly all the time or does not effectively solve problems and may need a restraint for safety  Memory Memory assist level: Recognizes or recalls less than 25% of the time/requires cueing greater than 75% of the time    Pain Pain Assessment Pain Assessment: No/denies pain  Therapy/Group: Individual Therapy  Charlane FerrettiMelissa Yidel Teuscher, M.A., CCC-SLP [phone]  [name] 03/16/2015, 12:57 PM

## 2015-03-16 NOTE — Progress Notes (Signed)
Occupational Therapy Session Note  Patient Details  Name: Juan [name] MRN: [id number] Date of Birth: 08/29/1990  Today's Date: 03/16/2015 OT Individual Time: 0700-0800 OT Individual Time Calculation (min): 60 min    Short Term Goals: Week 5:   STG=LTG secondary to ELOS  Skilled Therapeutic Interventions/Progress Updates:    Pt engaged in BADL retraining including bathing at shower level and dressing with sit<>stand from w/c at sink.  Pt continues to exhibit improved bed mobility and was able to perform supine->sit EOB with supervision this morning.  Pt initiates squat pivot transfer but requires max A to perform task.  Pt initiated washing hair and bathing tasks with min verbal cues when presented with supplies.  Pt required min verbal cues for thoroughness.  Pt was able to thread BLE into legs of shorts this morning.  Pt completed grooming tasks seated in w/c at sink.  Pt transitioned to w/c mobility tasks with focus on attention to environment to avoid running w/c into obstacles.  Pt required min verbal cues to attend to task and for proper technique with turning.    Therapy Documentation Precautions:  Precautions Precautions: Fall Precaution Comments: BLE NWB; trach, peg Required Braces or Orthoses: Other Brace/Splint Other Brace/Splint: B resting hand splints; B PRAFO Restrictions Weight Bearing Restrictions: Yes RLE Weight Bearing: Weight bearing as tolerated LLE Weight Bearing: Weight bearing as tolerated Other Position/Activity Restrictions: 10/21 now WBAT through BLE and no ROM restrictions Pain: Pain Assessment Pain Assessment: No/denies pain  See Function Navigator for Current Functional Status.   Therapy/Group: Individual Therapy  Rich BraveLanier, Lior Cartelli Chappell 03/16/2015, 8:04 AM

## 2015-03-16 NOTE — Progress Notes (Signed)
Occupational Therapy Weekly Progress Note  Patient Details  Name: Juan [name] MRN: [id number] Date of Birth: [birth year]  Beginning of progress report period: March 09, 2015 End of progress report period: March 16, 2015   Patient has met 4 of 4 short term goals. Pt made steady progress with BADLs, task initiation, sequencing, and attention to task during this past week.  Pt performs squat pivot transfers with max A and max verbal cues for sequencing.  Pt is able to stand with UE support while reaching for objects in preparation for standing to pull up pants without assistance.  Pt currently requires max A for standing and is unable to pull up pants.  Pt initiates bathing tasks with max verbal cues and min verbal cues to redirect when perseverating on a task.  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 Pt's girlfriend has been present for some therapy sessions.  Patient continues to demonstrate the following deficits: muscle weakness, decreased cardiorespiratoy endurance, impaired timing and sequencing, abnormal tone, unbalanced muscle activation, motor apraxia, decreased coordination and decreased motor planning, decreased visual perceptual skills, decreased motor planning, decreased initiation, decreased attention, decreased awareness, decreased problem solving, decreased safety awareness, decreased memory and delayed processing and decreased sitting balance, decreased standing balance, decreased postural control, decreased balance strategies and difficulty maintaining precautions and therefore will continue to benefit from skilled OT intervention to enhance overall performance with BADL and Reduce care partner burden.  Patient progressing toward long term goals..  Continue plan of care.  OT Short Term Goals Week 4:  OT Short Term Goal 1 (Week 4): Pt will perform shower transfers with max A -  squat pivot OT Short Term Goal 1 - Progress (Week 4): Met OT Short Term Goal 2 (Week 4): Pt will perform BSC transfers with max A - squat pivot OT Short Term Goal 2 - Progress (Week 4): Met OT Short Term Goal 3 (Week 4): Pt will perform LB dressing tasks with max A OT Short Term Goal 3 - Progress (Week 4): Met OT Short Term Goal 4 (Week 4): Pt will perform toileting tasks with max A OT Short Term Goal 4 - Progress (Week 4): Met Week 5:   STG=LTG secondary to ELOS       Therapy Documentation Precautions:  Precautions Precautions: Fall Precaution Comments: BLE NWB; trach, peg Required Braces or Orthoses: Other Brace/Splint Other Brace/Splint: B resting hand splints; B PRAFO Restrictions Weight Bearing Restrictions: Yes RLE Weight Bearing: Weight bearing as tolerated LLE Weight Bearing: Weight bearing as tolerated Other Position/Activity Restrictions: 10/21 now WBAT through BLE and no ROM restrictions   See Function Navigator for Current Functional Status.    Leotis Shames [facility] 03/16/2015, 6:51 AM

## 2015-03-16 NOTE — Progress Notes (Signed)
Occupational Therapy Note  Patient Details  Name: [name] MRN: [id number] Date of Birth: 05/30/1990  Today's Date: 03/16/2015 OT Individual Time: 1300-1330 OT Individual Time Calculation (min): 30 min   Pt denied pain Individual therapy  Pt resting in bed upon arrival and agreeable to therapy.  Initial focus on bed mobility and squat pivot transfer to w/c.  Pt required min verbal cues for sequencing but was able to come to sitting position at EOB.  Pt performed scoot transfer to w/c with mod A and min verbal cues.  Pt transitioned to therapy gym with focus on sit<>stand from w/c and standing without UE support.  Pt noted with lean to right when standing and difficulty lengthening trunk when standing.  PT hesitant to stand without UE support but was able to maintain standing position with min A for approx 15 seconds.  Activity focus on increased standing balance to increased independence with LB dressing and toileting tasks.   [name], Ordell Prichett [facility]ell 03/16/2015, 2:39 PM

## 2015-03-16 NOTE — Progress Notes (Signed)
[place] PHYSICAL MEDICINE & REHABILITATION     PROGRESS NOTE    Subjective/Complaints:  Took him a little longer to get to sleep last night but eventually he did around 10pm. After that slept well. Denies pain. Has occasional urinary incontinence. Walking with PT, propels w/c independently  ROS --limited due to cognitive status. Appears to deny leg pain, foot pain  Objective: Vital Signs: Blood pressure 114/63, pulse 97, temperature 98.6 F (37 C), temperature source Oral, resp. rate 16, height  (1.676 m), weight 62.5 kg (137 lb 12.6 oz), SpO2 99 %. No results found. No results for input(s): WBC, HGB, HCT, PLT in the last 72 hours. No results for input(s): NA, K, CL, GLUCOSE, BUN, CREATININE, CALCIUM in the last 72 hours.  Invalid input(s): CO CBG (last 3)  No results for input(s): GLUCAP in the last 72 hours.  Wt Readings from Last 3 Encounters:  03/16/15 62.5 kg (137 lb 12.6 oz)  02/12/15 73.936 kg (163 lb)  07/31/12 63.504 kg (140 lb)    Physical Exam:  Constitutional: He appears well-developed and well-nourished.  Alert.  HENT:  Head: Normocephalic and atraumatic.  Right Ear: External ear normal.  Left Ear: External ear normal.  Eyes: Right eye exhibits no discharge. Left eye exhibits no discharge. No scleral icterus. EOMI.  Neck: Normal range of motion. Neck supple. No JVD present. No tracheal deviation present. No thyromegaly present. Hypergranulation over closed stoma--associated drainage Cardiovascular: Normal rate and regular rhythm.  Respiratory: Effort normal and breath sounds normal. No respiratory distress. He has no wheezes. no rales GI: Soft. Bowel sounds are normal. He exhibits no distension. There is no tenderness. There is no rebound and no guarding.  Prior PEG area clean/dry.   Musculoskeletal: He exhibits no edema. No pain with range of motion of the knee or ankle, moderate tenderness with palpation of the tibia on the left side, no knee joint  swelling, no ankle joint swelling Neurological: speaks, usually in a whisper, occasionally phonates partially.  Moves all extremities.   Skin:  Numerous surgical scars on both LE's, healed.  Neuro: follows one step commands more quickly. Sitting posture and head position improving.  Psychiatric:  Flat affect more dynamic  Assessment/Plan: 1. 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 Physiatrist is providing close team supervision and 24 hour management of active medical problems listed below. Physiatrist and rehab team continue to assess barriers to discharge/monitor patient progress toward functional and medical goals.  Function:  Bathing Bathing position Bathing activity did not occur: N/A (Night Bath) Position: Bed  Bathing parts Body parts bathed by patient: Chest, Front perineal area, Abdomen, Right upper leg, Left upper leg, Left arm, Right arm Body parts bathed by helper: Buttocks, Right lower leg, Left lower leg  Bathing assist Assist Level: Touching or steadying assistance(Pt > 75%)      Upper Body Dressing/Undressing Upper body dressing   What is the patient wearing?: Pull over shirt/dress     Pull over shirt/dress - Perfomed by patient: Thread/unthread right sleeve, Thread/unthread left sleeve, Put head through opening, Pull shirt over trunk Pull over shirt/dress - Perfomed by helper: Put head through opening, Pull shirt over trunk        Upper body assist Assist Level: Set up, Supervision or verbal cues   Set up : To obtain clothing/put away  Lower Body Dressing/Undressing Lower body dressing Lower body dressing/undressing activity did  not occur: N/A (diaper) What is the patient wearing?: Underwear, Pants, Socks, Shoes   Underwear - Performed by helper: Thread/unthread right underwear leg, Thread/unthread left underwear leg, Pull underwear  up/down Pants- Performed by patient: Thread/unthread right pants leg, Thread/unthread left pants leg Pants- Performed by helper: Pull pants up/down   Non-skid slipper socks- Performed by helper: Don/doff right sock, Don/doff left sock   Socks - Performed by helper: Don/doff right sock, Don/doff left sock Shoes - Performed by patient: Don/doff right shoe, Don/doff left shoe Shoes - Performed by helper: Fasten right, Fasten left          Lower body assist Assist for lower body dressing: Touching or steadying assistance (Pt > 75%)      Toileting Toileting Toileting activity did not occur: N/A Toileting steps completed by patient: Performs perineal hygiene Toileting steps completed by helper: Adjust clothing prior to toileting, Adjust clothing after toileting Toileting Assistive Devices: Grab bar or rail  Toileting assist Assist level: Two helpers   Transfers Chair/bed transfer   Chair/bed transfer method: Squat pivot, Stand pivot Chair/bed transfer assist level: Maximal assist (Pt 25 - 49%/lift and lower) Chair/bed transfer assistive device: Armrests Mechanical lift: Maximove   Locomotion Ambulation Ambulation activity did not occur: Safety/medical concerns (NWB bilateral lower extremities)   Max distance: 45 ft Assist level: Maximal assist (Pt 25 - 49%)   Wheelchair Wheelchair activity did not occur: Safety/medical concerns (low level TBI) Type: Manual Max wheelchair distance: 150 ft Assist Level: Supervision or verbal cues  Cognition Comprehension Comprehension assist level: Understands basic 50 - 74% of the time/ requires cueing 25 - 49% of the time  Expression Expression assist level: Expresses basic 50 - 74% of the time/requires cueing 25 - 49% of the time. Needs to repeat parts of sentences.  Social Interaction Social Interaction assist level: Interacts appropriately 25 - 49% of time - Needs frequent redirection.  Problem Solving Problem solving assist level: Solves  basic less than 25% of the time - needs direction nearly all the time or does not effectively solve problems and may need a restraint for safety  Memory Memory assist level: Recognizes or recalls less than 25% of the time/requires cueing greater than 75% of the time   Medical Problem List and Plan: 1. 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  -discussed follow up with patient today. Will check with ortho before he goes home for follow up plan  -pt will live temporarily in [place] but will eventually return to [place]. 2. DVT Prophylaxis/Anticoagulation: Subcutaneous Lovenox as well as IVC filter 01/12/2015---still indicated 3. Pain Management:    - continue tramadol 100 mg every 8 hours, consider wean before dc,  -dc'ed ms contin to help with sedation 4. Mood/behavior/cognition: ritalin 15mg  to improve attention/initiation further.    -propranolol weaned to 40mg  TID on 10/26.  Wean further over next few days  -Stopped klonopin   -slept "well" last night per famiy, but i wonder if this was infact the case given day time fatigue today  -seroquel stopped. Will not resume, but added prn trazodone.    -continue sleep chart  -family at bedside for safety reasons---otherwise this patient would require some sort of restraining 5. Neuropsych: This patient is not capable of making decisions on his own behalf. 6. Skin/Wound Care: Routine skin checks continue 7. Fluids/Electrolytes/Nutrition:   -K+ increased due to  hemolysis. The remainders of labs normal  - Recent labs stable 8. Uncontrollable rhythmic movements/spasticity. 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 Dysphagia: now on  regular solids /thins   -intake generally improved  -PEG/ foley d/ced 10/20 without complications  11. Large tension pneumothorax on the right with multiple rib fractures/pulmonary  -?LLL pneumonia---CXR resolved.    12. Multiple orthopedic fractures, L1-2 transverse process fractures,/left femur fracture with IM nailing, left and right open tibia fibular fractures, transverse acetabulum fracture status post percutaneous pinning,CM pelvic ring status post ORIF, pubic symphysis and trans-sacral screw forL S I diastasis and right sacralalo fracture. Follow-up x-ray personally reviewed, which shows healing of fibula >tibia.  Ortho reevaluated pt and he is now WBAT b/l LE!!.  13. Urine retention/neurogenic bladder: flomax   -decrease urecholine to  tid to decrease incontinence  -spontaneous voiding improved. no I/O cath since 10/24  -timed voiding to continue to improve continence, has had incontinence at night   LOS (Days) 31 A FACE TO FACE EVALUATION WAS PERFORMED  SWARTZ,ZACHARY T 03/16/2015 12:47 PM

## 2015-03-16 NOTE — Progress Notes (Signed)
Speech Language Pathology Daily Session Note  Patient Details  Name: Juan [name] XXXHunt MRN: [id number] Date of Birth: 06/18/1990  Today'[name] Date: 03/16/2015 SLP Individual Time: 1455-1520 SLP Individual Time Calculation (min): 25 min  Short Term Goals: Week 5: SLP Short Term Goal 1 (Week 5): Patient will sustain attention to a task for 10 minutes with Max A multimodal cues.  SLP Short Term Goal 2 (Week 5): Patient will demonstrate orientation to time with Max A multimodal cues.  SLP Short Term Goal 3 (Week 5): Patient will identify 1 physical and 1 cognitive deficit with Max A multimodal cues.  SLP Short Term Goal 4 (Week 5): Patient will consume current diet with minimal overt [name]/[name] of aspiration with Mod A multimodal cues for use of strategies.  SLP Short Term Goal 5 (Week 5): Patient will consume trials of regular textures and demonstrate efficient mastication without overt [name]/[name] of aspiration over 2 consecutive sesions with Mod A multimodal cues.  SLP Short Term Goal 6 (Week 5): Patient will vocalize with Max A multimodal cues in 25% of opportunities   Skilled Therapeutic Interventions:  Pt was seen for skilled ST targeting dysphagia goals.  SLP facilitated the session with skilled observations completed during trials of regular textures to assess toleration of recent diet upgrade.  Pt consumed regular textures and thin liquids with min assist-supervision cues for use of swallowing precautions.   No overt [name]/[name] of aspiration were evident with solids or liquids and pt demonstrated complete clearance of residual solids from the oral cavity post swallow.  Pt left upright in wheelchair with family present in room.  Continue per current plan of care.    Function:  Eating Eating   Modified Consistency Diet: No Eating Assist Level: More than reasonable amount of time;Supervision or verbal cues;Set up assist for   Eating Set Up Assist For: Opening containers Helper Scoops Food on Utensil:  Occasionally Helper Brings Food to Mouth: Occasionally   Cognition Comprehension Comprehension assist level: Understands basic 50 - 74% of the time/ requires cueing 25 - 49% of the time  Expression   Expression assist level: Expresses basic 25 - 49% of the time/requires cueing 50 - 75% of the time. Uses single words/gestures.  Social Interaction Social Interaction assist level: Interacts appropriately 50 - 74% of the time - May be physically or verbally inappropriate.  Problem Solving Problem solving assist level: Solves basic less than 25% of the time - needs direction nearly all the time or does not effectively solve problems and may need a restraint for safety  Memory Memory assist level: Recognizes or recalls less than 25% of the time/requires cueing greater than 75% of the time    Pain Pain Assessment Pain Assessment: No/denies pain  Therapy/Group: Individual Therapy  Anushka Hartinger, Melanee SpryNicole L 03/16/2015, 4:37 PM

## 2015-03-16 NOTE — Progress Notes (Addendum)
Physical Therapy Session Note  Patient Details  Name: Juan [name] MRN: [id number] Date of Birth: 12/22/1990  Today's Date: 03/16/2015 PT Individual Time: 1000-1100 PT Individual Time Calculation (min): 60 min   Short Term Goals: Week 5:  PT Short Term Goal 1 (Week 5): = LTGs due to anticipated LOS  Skilled Therapeutic Interventions/Progress Updates:   Session focused with hands-on family training with patient's girlfriend [name] for stand pivot transfers with mod A (max A for last transfer at end of session due to fatigue) and squat pivot transfers with supervision with max cues for sequencing and technique, wheelchair mobility with supervision and max cues for sequencing steering, gait multiple trials between 30-45 ft using RW with min A overall and up to max A with focus on turning and safely backing up to surfaces before sitting with max cues for sequencing and safe use of AD, negotiating up/down 4 (6") steps and up/down 8 (3")/4 (6") steps with seated rest between using 2 rails with mod A > max A. Patient participated in shooting basketball from seated/standing position using BUE. Patient continues to demonstrate the most difficulty with backing up to chairs/seats, especially moving RLE backward and requires max multimodal cues for sequencing and upright posture. Patient's girlfriend cleared to assist patient with transfers and provided appropriate level of assistance and cueing during session. Patient left sitting in wheelchair to return to room with girlfriend.    Therapy Documentation Precautions:  Precautions Precautions: Fall Precaution Comments: BLE NWB; trach, peg Required Braces or Orthoses: Other Brace/Splint Other Brace/Splint: B resting hand splints; B PRAFO Restrictions Weight Bearing Restrictions: Yes RLE Weight Bearing: Weight bearing as tolerated LLE Weight Bearing: Weight bearing as tolerated Other Position/Activity Restrictions: 10/21 now WBAT through BLE and no ROM  restrictions Pain: Pain Assessment Pain Assessment: No/denies pain Pain Score: 0-No pain   See Function Navigator for Current Functional Status.   Therapy/Group: Individual Therapy  Kerney ElbeVarner, Antario Yasuda A 03/16/2015, 11:04 AM

## 2015-03-17 ENCOUNTER — Inpatient Hospital Stay (HOSPITAL_COMMUNITY): Payer: 59 | Admitting: Physical Therapy

## 2015-03-17 NOTE — Progress Notes (Signed)
[facility] PHYSICAL MEDICINE & REHABILITATION     PROGRESS NOTE    Subjective/Complaints:  Slept well last night. Didn't void overnight. Voided well during the day though  ROS --limited due to cognitive status.   denies leg pain, foot pain  Objective: Vital Signs: Blood pressure 118/80, pulse 82, temperature 98.4 F (36.9 C), temperature source Oral, resp. rate 18, height  (1.676 m), weight 64 kg (141 lb 1.5 oz), SpO2 99 %. No results found. No results for input(s): WBC, HGB, HCT, PLT in the last 72 hours. No results for input(s): NA, K, CL, GLUCOSE, BUN, CREATININE, CALCIUM in the last 72 hours.  Invalid input(s): CO CBG (last 3)  No results for input(s): GLUCAP in the last 72 hours.  Wt Readings from Last 3 Encounters:  03/17/15 64 kg (141 lb 1.5 oz)  02/12/15 73.936 kg (163 lb)  07/31/12 63.504 kg (140 lb)    Physical Exam:  Constitutional: He appears well-developed and well-nourished.  Alert.  HENT:  Head: Normocephalic and atraumatic.  Right Ear: External ear normal.  Left Ear: External ear normal.  Eyes: Right eye exhibits no discharge. Left eye exhibits no discharge. No scleral icterus. EOMI.  Neck: Normal range of motion. Neck supple. No JVD present. No tracheal deviation present. No thyromegaly present. Hypergranulation over closed stoma--associated drainage Cardiovascular: Normal rate and regular rhythm.  Respiratory: Effort normal and breath sounds normal. No respiratory distress. He has no wheezes. no rales GI: Soft. Bowel sounds are normal. He exhibits no distension. There is no tenderness. There is no rebound and no guarding.  Prior PEG area clean/dry.   Musculoskeletal: He exhibits no edema. No pain with range of motion of the knee or ankle, moderate tenderness with palpation of the tibia on the left side, no knee joint swelling, no ankle joint swelling Neurological: speaks, usually in a whisper, occasionally phonates partially.  Moves all extremities.    Skin:  Numerous surgical scars on both LE's, healed.  Neuro: follows one step commands more quickly. Sitting posture and head position improved.  Psychiatric:  Flat affect more dynamic. Displays more emotions  Assessment/Plan: 1. 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 Physiatrist is providing close team supervision and 24 hour management of active medical problems listed below. Physiatrist and rehab team continue to assess barriers to discharge/monitor patient progress toward functional and medical goals.  Function:  Bathing Bathing position Bathing activity did not occur: N/A (Night Bath) Position: Bed  Bathing parts Body parts bathed by patient: Chest, Front perineal area, Abdomen, Right upper leg, Left upper leg, Left arm, Right arm Body parts bathed by helper: Buttocks, Right lower leg, Left lower leg  Bathing assist Assist Level: Touching or steadying assistance(Pt > 75%)      Upper Body Dressing/Undressing Upper body dressing   What is the patient wearing?: Pull over shirt/dress     Pull over shirt/dress - Perfomed by patient: Thread/unthread right sleeve, Thread/unthread left sleeve, Put head through opening, Pull shirt over trunk Pull over shirt/dress - Perfomed by helper: Put head through opening, Pull shirt over trunk        Upper body assist Assist Level: Set up, Supervision or verbal cues   Set up : To obtain clothing/put away  Lower Body Dressing/Undressing Lower body dressing Lower body dressing/undressing activity did not occur: N/A (diaper) What is the patient wearing?: Underwear, Pants, Socks, Shoes   Underwear -  Performed by helper: Thread/unthread right underwear leg, Thread/unthread left underwear leg, Pull underwear up/down Pants- Performed by patient: Thread/unthread right pants leg, Thread/unthread left pants leg Pants- Performed by  helper: Pull pants up/down   Non-skid slipper socks- Performed by helper: Don/doff right sock, Don/doff left sock   Socks - Performed by helper: Don/doff right sock, Don/doff left sock Shoes - Performed by patient: Don/doff right shoe, Don/doff left shoe Shoes - Performed by helper: Fasten right, Fasten left          Lower body assist Assist for lower body dressing: Touching or steadying assistance (Pt > 75%)      Toileting Toileting Toileting activity did not occur: N/A Toileting steps completed by patient: Performs perineal hygiene Toileting steps completed by helper: Adjust clothing prior to toileting, Adjust clothing after toileting Toileting Assistive Devices: Grab bar or rail  Toileting assist Assist level: Two helpers   Transfers Chair/bed transfer   Chair/bed transfer method: Squat pivot, Stand pivot Chair/bed transfer assist level: Maximal assist (Pt 25 - 49%/lift and lower) Chair/bed transfer assistive device: Armrests Mechanical lift: Maximove   Locomotion Ambulation Ambulation activity did not occur: Safety/medical concerns (NWB bilateral lower extremities)   Max distance: 45 ft Assist level: Maximal assist (Pt 25 - 49%)   Wheelchair Wheelchair activity did not occur: Safety/medical concerns (low level TBI) Type: Manual Max wheelchair distance: 150 ft Assist Level: Supervision or verbal cues  Cognition Comprehension Comprehension assist level: Understands basic 50 - 74% of the time/ requires cueing 25 - 49% of the time  Expression Expression assist level: Expresses basic 25 - 49% of the time/requires cueing 50 - 75% of the time. Uses single words/gestures.  Social Interaction Social Interaction assist level: Interacts appropriately 50 - 74% of the time - May be physically or verbally inappropriate.  Problem Solving Problem solving assist level: Solves basic 25 - 49% of the time - needs direction more than half the time to initiate, plan or complete simple  activities  Memory Memory assist level: Recognizes or recalls less than 25% of the time/requires cueing greater than 75% of the time   Medical Problem List and Plan: 1. 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  -discussed follow up with patient today. Will check with ortho before he goes home for follow up plan  -pt will live temporarily in [place] but will eventually return to [place]. I can consult with PCP there regarding any rehab needs or questions---don't think it makes too much sense to find a PMR doc there if he will be back in [place] in near future. 2. DVT Prophylaxis/Anticoagulation: Subcutaneous Lovenox as well as IVC filter 01/12/2015---still indicated 3. Pain Management:    - continue tramadol 100 mg every 8 hours, consider wean before dc,  -dc'ed ms contin to help with sedation 4. Mood/behavior/cognition: ritalin 15mg  to improve attention/initiation further.    -propranolol weaned to 40mg  TID on 10/26.  Decrease further ?monday  -Stopped klonopin      -for sleep added prn trazodone.    -continue sleep chart  -family at bedside for safety reasons---otherwise this patient would require some sort of restraining 5. Neuropsych: This patient is not capable of making decisions on his own behalf. 6. Skin/Wound Care: Routine skin checks continue 7. Fluids/Electrolytes/Nutrition:   -K+ increased due to hemolysis. The remainders of labs normal  - Recent labs stable 8.  Uncontrollable rhythmic movements/spasticity. 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 Dysphagia: now on regular solids /thins   -intake generally improved  -PEG/ foley d/ced 10/20 without complications   11. Large tension pneumothorax on the right with multiple rib fractures/pulmonary  -?LLL pneumonia---CXR resolved.    12. Multiple orthopedic fractures, L1-2 transverse process fractures,/left femur fracture with IM nailing, left and right open tibia fibular fractures, transverse acetabulum fracture status post percutaneous pinning,CM pelvic ring status post ORIF, pubic symphysis and trans-sacral screw forL S I diastasis and right sacralalo fracture. Follow-up x-ray personally reviewed, which shows healing of fibula >tibia.  Ortho reevaluated pt and he is now WBAT b/l LE!!.  13. Urine retention/neurogenic bladder: flomax   -decrease urecholine to  tid to decrease incontinence  -spontaneous voiding improved. no I/O cath since 10/24  -timed voiding to continue to improve continence  -no incontinence at night now. May go overnight without voiding   LOS (Days) 32 A FACE TO FACE EVALUATION WAS PERFORMED  Havannah Streat T 03/17/2015 9:26 AM

## 2015-03-17 NOTE — Progress Notes (Signed)
Physical Therapy Session Note  Patient Details  Name: Juan [name] MRN: [id number] Date of Birth: 02/22/1991  Today's Date: 03/17/2015 PT Individual Time: 1100-1200 PT Individual Time Calculation (min): 60 min   Short Term Goals: Week 5:  PT Short Term Goal 1 (Week 5): = LTGs due to anticipated LOS  Skilled Therapeutic Interventions/Progress Updates:   Session focused on family training with patient and patient's mother. Patient propelled wheelchair using BUE throughout rehab unit with supervision and mod verbal/tactile cues for sequencing due to difficulty with L/R discrimination for steering, patient able to self correct with increased time. Patient's mother reporting increased L ankle pain with weightbearing yesterday PM and today. Patient stated he could "walk by myself," planned failure with sit <> stand as patient was unable to come to complete stand independently and sat back in wheelchair, required min [name] for sit > stand and verbalized understanding of need for assistance, continue to reinforce. Patient ambulated using RW with mom providing supervision overall and hands-on assistance for turning/guidance to chair for safety x 50 ft + 30 ft, max verbal cues for sequencing and safe use of AD when turning to sit. Patient declined further ambulation/stair training this date due to pain. Patient's mom instructed in BLE stretching program with therapist demonstrating on LLE (decreased muscle extensibility and more painful compared to RLE) and mom return demonstrating each stretch on RLE with mod cues for technique. Patient's mother educated on importance of optimal alignment and hand placement for each stretch, verbalized understanding and provided with handout of stretches with pictures for visual aids. Patient required min [name] for reciprocal scooting to edge of mat and cues for hand placement with squat pivot transfer, supervision. Discussed need for quick release belt when patient up in wheelchair  at home as he is at high risk for falls due to cognitive impairments and decreased awareness of physical/cognitive deficits, patient's mother instructed to purchase gait belt at gift shop and in agreement with recommendation. Discussed car ride for return trip home at discharge and need to stand/ambulate as able during frequent rest breaks. Patient propelled wheelchair back to room and left sitting in wheelchair with mother present. Patient and mother with no questions/concerns regarding discharge at this time.   Therapy Documentation Precautions:  Precautions Precautions: Fall Precaution Comments: BLE NWB; trach, peg Required Braces or Orthoses: Other Brace/Splint Other Brace/Splint: B resting hand splints; B PRAFO Restrictions Weight Bearing Restrictions: Yes RLE Weight Bearing: Weight bearing as tolerated LLE Weight Bearing: Weight bearing as tolerated Other Position/Activity Restrictions: 10/21 now WBAT through BLE and no ROM restrictions Pain: Pain Assessment Pain Assessment: Faces Faces Pain Scale: Hurts even more Pain Type: Acute pain Pain Location: Ankle Pain Orientation: Left Pain Descriptors / Indicators: Grimacing;Discomfort Pain Onset: With Activity Pain Intervention(s): Repositioned;Rest;Other (Comment) (stretching)   See Function Navigator for Current Functional Status.   Therapy/Group: Individual Therapy  Juan [name], Juan [name] 03/17/2015, 1:03 PM

## 2015-03-17 NOTE — Plan of Care (Signed)
Problem: RH Car Transfers Goal: LTG Patient will perform car transfers with assist (PT) LTG: Patient will perform car transfers with assistance (PT).  Downgraded due to slow pt progress as patient requires increased assistance backing RLE up to surface before sitting safely.

## 2015-03-18 ENCOUNTER — Inpatient Hospital Stay (HOSPITAL_COMMUNITY): Payer: 59 | Admitting: *Deleted

## 2015-03-18 MED ORDER — QUETIAPINE FUMARATE 50 MG PO TABS
50.0000 mg | ORAL_TABLET | Freq: Every day | ORAL | Status: DC
  Administered 2015-03-18 – 2015-03-19 (×2): 50 mg via ORAL
  Filled 2015-03-18 (×2): qty 1

## 2015-03-18 MED ORDER — TRAMADOL HCL 50 MG PO TABS
50.0000 mg | ORAL_TABLET | Freq: Three times a day (TID) | ORAL | Status: DC
  Administered 2015-03-18 – 2015-03-20 (×6): 50 mg
  Filled 2015-03-18 (×6): qty 1

## 2015-03-18 NOTE — Progress Notes (Signed)
Physical Therapy Session Note  Patient Details  Name: Juan [name] MRN: [id number] Date of Birth: 08/28/1990  Today's Date: 03/18/2015 PT Individual Time: 0900-1000 PT Individual Time Calculation (min): 60 min   Short Term Goals: Week 5:  PT Short Term Goal 1 (Week 5): = LTGs due to anticipated LOS  Skilled Therapeutic Interventions/Progress Updates:  Tx focused on functional mobility training, cognitive remediation, gait with RW, and NMR via forced use, manual facilitation, and multi-modal cues during balance activities and activity tolerance. Pt with with mom and grandmother present. Discussed stretching program, functional status, DC plans. They have no questions or concerns at this time and plan to perfrom stretching program today.   Pt performed squat-pivot transfers throughout tx and sit<>stands with min A overall for steadying and anterior translation.    PT propelled WC x150' with Min A for maintaining straight path with tactile and demo cues for turning technique.   Dynamic sitting and standing balance performed during rebounder ball toss and seated punching bag with various hitting patterns. Pt stood 5x611min with up to Mod A for standing balance during ball rebounder and safety cues.   Performed supine stretching program with hip felxor stretch inThomas position x1 min each mulscle group bil.   Gait training with RW 1x35' and 1x50' with RW and min A overall, max safety cues for turning and retro walking.  [facility]       Therapy Documentation Precautions:  Precautions Precautions: Fall Precaution Comments: BLE NWB; trach, peg Required Braces or Orthoses: Other Brace/Splint Other Brace/Splint: B resting hand splints; B PRAFO Restrictions Weight Bearing Restrictions: Yes RLE Weight Bearing: Weight bearing as tolerated LLE Weight Bearing: Weight bearing as tolerated Other Position/Activity Restrictions: 10/21 now WBAT through BLE and no ROM  restrictions General:   Vital Signs: Therapy Vitals Temp: 98.6 F (37 C) Temp Source: Oral Pulse Rate: 75 Resp: 18 BP: 128/80 mmHg Patient Position (if appropriate): Sitting Oxygen Therapy SpO2: 100 % O2 Device: Not Delivered Pain: none   See Function Navigator for Current Functional Status.   Therapy/Group: Individual Therapy   Clydene Lamingole [name], PT, DPT  03/18/2015, 9:21 AM

## 2015-03-18 NOTE — Progress Notes (Signed)
Juan [name] PHYSICAL MEDICINE & REHABILITATION     PROGRESS NOTE    Subjective/Complaints:  Slept well last night. Didn't void overnight. Voided well during the day though  ROS --limited due to cognitive status.   denies leg pain, foot pain  Objective: Vital Signs: Blood pressure 128/80, pulse 75, temperature 98.6 F (37 C), temperature source Oral, resp. rate 18, height  (1.676 m), weight 63.8 kg (140 lb 10.5 oz), SpO2 100 %. No results found. No results for input(s): WBC, HGB, HCT, PLT in the last 72 hours. No results for input(s): NA, K, CL, GLUCOSE, BUN, CREATININE, CALCIUM in the last 72 hours.  Invalid input(s): CO CBG (last 3)  No results for input(s): GLUCAP in the last 72 hours.  Wt Readings from Last 3 Encounters:  03/18/15 63.8 kg (140 lb 10.5 oz)  02/12/15 73.936 kg (163 lb)  07/31/12 63.504 kg (140 lb)    Physical Exam:  Constitutional: He appears well-developed and well-nourished.  Alert.  HENT:  Head: Normocephalic and atraumatic.  Right Ear: External ear normal.  Left Ear: External ear normal.  Eyes: Right eye exhibits no discharge. Left eye exhibits no discharge. No scleral icterus. EOMI.  Neck: Normal range of motion. Neck supple. No JVD present. No tracheal deviation present. No thyromegaly present. Hypergranulation over closed stoma--associated drainage Cardiovascular: Normal rate and regular rhythm.  Respiratory: Effort normal and breath sounds normal. No respiratory distress. He has no wheezes. no rales GI: Soft. Bowel sounds are normal. He exhibits no distension. There is no tenderness. There is no rebound and no guarding.  Prior PEG area clean/dry.   Musculoskeletal: He exhibits no edema. No pain with range of motion of the knee or ankle, moderate tenderness with palpation of the tibia on the left side, no knee joint swelling, no ankle joint swelling Neurological: speaks, usually in a whisper, occasionally phonates partially.  Moves all  extremities.   Skin:  Numerous surgical scars on both LE's, healed.  Neuro: follows one step commands more quickly. Sitting posture and head position improved.  Psychiatric:  Flat affect more dynamic. Displays more emotions  Assessment/Plan: 1. 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 Physiatrist is providing close team supervision and 24 hour management of active medical problems listed below. Physiatrist and rehab team continue to assess barriers to discharge/monitor patient progress toward functional and medical goals.  Function:  Bathing Bathing position Bathing activity did not occur: N/A (Night Bath) Position: Bed  Bathing parts Body parts bathed by patient: Chest, Front perineal area, Abdomen, Right upper leg, Left upper leg, Left arm, Right arm Body parts bathed by helper: Buttocks, Right lower leg, Left lower leg  Bathing assist Assist Level: Touching or steadying assistance(Pt > 75%)      Upper Body Dressing/Undressing Upper body dressing   What is the patient wearing?: Pull over shirt/dress     Pull over shirt/dress - Perfomed by patient: Thread/unthread right sleeve, Thread/unthread left sleeve, Put head through opening, Pull shirt over trunk Pull over shirt/dress - Perfomed by helper: Put head through opening, Pull shirt over trunk        Upper body assist Assist Level: Set up, Supervision or verbal cues   Set up : To obtain clothing/put away  Lower Body Dressing/Undressing Lower body dressing Lower body dressing/undressing activity did not occur: N/A (diaper) What is the patient wearing?: Underwear, Pants, Socks, Shoes   Underwear -  Performed by helper: Thread/unthread right underwear leg, Thread/unthread left underwear leg, Pull underwear up/down Pants- Performed by patient: Thread/unthread right pants leg, Thread/unthread left pants leg Pants-  Performed by helper: Pull pants up/down   Non-skid slipper socks- Performed by helper: Don/doff right sock, Don/doff left sock   Socks - Performed by helper: Don/doff right sock, Don/doff left sock Shoes - Performed by patient: Don/doff right shoe, Don/doff left shoe Shoes - Performed by helper: Fasten right, Fasten left          Lower body assist Assist for lower body dressing: Touching or steadying assistance (Pt > 75%)      Toileting Toileting Toileting activity did not occur: N/A Toileting steps completed by patient: Performs perineal hygiene Toileting steps completed by helper: Adjust clothing prior to toileting, Adjust clothing after toileting Toileting Assistive Devices: Grab bar or rail  Toileting assist Assist level: Touching or steadying assistance (Pt.75%)   Transfers Chair/bed transfer   Chair/bed transfer method: Lateral scoot Chair/bed transfer assist level: Supervision or verbal cues Chair/bed transfer assistive device: Armrests Mechanical lift: Maximove   Locomotion Ambulation Ambulation activity did not occur: Safety/medical concerns (NWB bilateral lower extremities)   Max distance: 50 ft Assist level: Touching or steadying assistance (Pt > 75%)   Wheelchair Wheelchair activity did not occur: Safety/medical concerns (low level TBI) Type: Manual Max wheelchair distance: 150 ft Assist Level: Supervision or verbal cues  Cognition Comprehension Comprehension assist level: Understands basic 50 - 74% of the time/ requires cueing 25 - 49% of the time  Expression Expression assist level: Expresses basic 50 - 74% of the time/requires cueing 25 - 49% of the time. Needs to repeat parts of sentences.  Social Interaction Social Interaction assist level: Interacts appropriately 50 - 74% of the time - May be physically or verbally inappropriate.  Problem Solving Problem solving assist level: Solves basic 25 - 49% of the time - needs direction more than half the time to  initiate, plan or complete simple activities  Memory Memory assist level: Recognizes or recalls 25 - 49% of the time/requires cueing 50 - 75% of the time   Medical Problem List and Plan: 1. 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  -answered multiple questions from family today regarding prognosis, after care, follow up. Will try to pull things together this week.   -pt will live temporarily in [place] but will eventually return to [place]. I can consult with PCP there regarding any rehab needs or questions---don't think it makes too much sense to find a PMR doc there if he will be back in [place] in near future. 2. DVT Prophylaxis/Anticoagulation: Subcutaneous Lovenox as well as IVC filter 01/12/2015---lovenox still indicated 3. Pain Management:    - reduce tramadol to 50 mg every 8 hours  -dc'ed ms contin to help with sedation 4. Mood/behavior/cognition: ritalin 15mg  to improve attention/initiation further.    -propranolol weaned to 40mg  TID on 10/26.  Decrease further ?monday  -Stopped klonopin      -resume seroquel for sleep. Can dc once he's home.    -continue sleep chart  -family at bedside for safety reasons---otherwise this patient would require some sort of restraining 5. Neuropsych: This patient is not capable of making decisions on his own behalf. 6. Skin/Wound Care: Routine skin checks continue 7. Fluids/Electrolytes/Nutrition:   -K+ increased due to hemolysis. The remainders of labs normal  -  Recent labs stable 8. Uncontrollable rhythmic movements/spasticity. 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 Dysphagia: now on regular solids /thins   -intake generally  improved  -PEG/ foley d/ced 10/20 without complications  11. Large tension pneumothorax on the right with multiple rib fractures/pulmonary  -?LLL pneumonia---CXR resolved.    12. Multiple orthopedic fractures, L1-2 transverse process fractures,/left femur fracture with IM nailing, left and right open tibia fibular fractures, transverse acetabulum fracture status post percutaneous pinning,CM pelvic ring status post ORIF, pubic symphysis and trans-sacral screw forL S I diastasis and right sacralalo fracture. Follow-up x-ray personally reviewed, which shows healing of fibula >tibia.  Ortho reevaluated pt and he is now WBAT b/l LE!!---further follow up before dc?.  13. Urine retention/neurogenic bladder: flomax   -decreased urecholine to  tid to decrease incontinence  -spontaneous voiding improved. no I/O cath since 10/24  -timed voiding to continue to improve continence  -no incontinence at night now. May go overnight without voiding   LOS (Days) 33 A FACE TO FACE EVALUATION WAS PERFORMED  SWARTZ,ZACHARY T 03/18/2015 9:59 AM

## 2015-03-19 ENCOUNTER — Inpatient Hospital Stay (HOSPITAL_COMMUNITY): Payer: 59 | Admitting: Physical Therapy

## 2015-03-19 ENCOUNTER — Encounter (HOSPITAL_COMMUNITY): Payer: Self-pay

## 2015-03-19 ENCOUNTER — Inpatient Hospital Stay (HOSPITAL_COMMUNITY): Payer: 59 | Admitting: Speech Pathology

## 2015-03-19 MED ORDER — METHYLPHENIDATE HCL 5 MG PO TABS: 15.0000 mg | ORAL_TABLET | Freq: Two times a day (BID) | ORAL | Status: DC

## 2015-03-19 MED ORDER — TAMSULOSIN HCL 0.4 MG PO CAPS: 0.4000 mg | ORAL_CAPSULE | Freq: Every day | ORAL | Status: DC

## 2015-03-19 MED ORDER — PROPRANOLOL HCL 40 MG PO TABS: 40.0000 mg | ORAL_TABLET | Freq: Three times a day (TID) | ORAL | Status: DC

## 2015-03-19 MED ORDER — BETHANECHOL CHLORIDE 5 MG PO TABS: 5.0000 mg | ORAL_TABLET | Freq: Three times a day (TID) | ORAL | Status: DC

## 2015-03-19 MED ORDER — HYDROCODONE-ACETAMINOPHEN 5-325 MG PO TABS: 1.0000 | ORAL_TABLET | Freq: Four times a day (QID) | ORAL | Status: DC | PRN

## 2015-03-19 MED ORDER — CLONAZEPAM 0.5 MG PO TABS: 0.5000 mg | ORAL_TABLET | Freq: Two times a day (BID) | ORAL | Status: DC

## 2015-03-19 MED ORDER — LACOSAMIDE 200 MG PO TABS: 200.0000 mg | ORAL_TABLET | Freq: Two times a day (BID) | ORAL | Status: DC

## 2015-03-19 MED ORDER — TRAMADOL HCL 50 MG PO TABS: 50.0000 mg | ORAL_TABLET | Freq: Three times a day (TID) | ORAL | Status: DC

## 2015-03-19 MED ORDER — CLONAZEPAM 0.5 MG PO TABS
0.5000 mg | ORAL_TABLET | Freq: Two times a day (BID) | ORAL | Status: DC
  Administered 2015-03-19 – 2015-03-20 (×3): 0.5 mg via ORAL
  Filled 2015-03-19 (×3): qty 1
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
## 2015-03-19 NOTE — Progress Notes (Signed)
Speech Language Pathology Daily Session Notes  Patient Details  Name: Juan [name] MRN: [id number] Date of Birth: Nov 30, 1990  Today's Date: 03/19/2015  Session 1: SLP Individual Time: 0800-0900 SLP Individual Time Calculation (min): 60 min   Session 2: SLP Individual Time: 1415-1445 SLP Individual Time Calculation (min): 30 min    Short Term Goals: Week 5: SLP Short Term Goal 1 (Week 5): Patient will sustain attention to a task for 10 minutes with Max A multimodal cues.  SLP Short Term Goal 2 (Week 5): Patient will demonstrate orientation to time with Max A multimodal cues.  SLP Short Term Goal 3 (Week 5): Patient will identify 1 physical and 1 cognitive deficit with Max A multimodal cues.  SLP Short Term Goal 4 (Week 5): Patient will consume current diet with minimal overt s/s of aspiration with Mod A multimodal cues for use of strategies.  SLP Short Term Goal 5 (Week 5): Patient will consume trials of regular textures and demonstrate efficient mastication without overt s/s of aspiration over 2 consecutive sesions with Mod A multimodal cues.  SLP Short Term Goal 6 (Week 5): Patient will vocalize with Max A multimodal cues in 25% of opportunities   Skilled Therapeutic Interventions:  Session 1: Skilled treatment session focused on dysphagia and cognitive goals. SLP facilitated session by providing set-up assist with breakfast meal of regular textures with thin liquids. Patient consumed meal without overt s/s of aspiration and was Mod I for use of swallowing compensatory strategies. Patient also performed basic cognitive-linguistic tasks and answered basic yes/no questions with 100% accuracy, complex yes/no questions with 90% accuracy and yes/no questions at the paragraph level with 65% accuracy. Patient also followed 1 and 2 step directions with extra time and repetition and required total A to follow 3 step commands, suspect due to impaired recall and apraxia. Patient completed basic  naming and responsive naming tasks with 100% accuracy and required Mod A multimodal cues for orientation to time. Patient left upright in wheelchair with family present. Continue with current plan of care.    Session 2: Skilled treamtent session focused on cognitive goals and completion of education with the patient's girlfriend. Upon arrival, patient was awake while sitting EOB with his girlfriend present and donned his socks and shoes with extra time and Mod A verbal cues for problem solving and was transferred to the wheelchair by his girlfriend. The patient and his girlfriend were educated on the importance of utilizing a routine/schedule at home, therefore, the patient created a routine/list of activities he can do at home with Mod-Max A verbal and question cues for problem solving and awareness throughout the task. Patient left in wheelchair with girlfriend present. Continue with current plan of care.   Function:  Eating Eating   Modified Consistency Diet: No Eating Assist Level: Set up assist for   Eating Set Up Assist For: Opening containers       Cognition Comprehension Comprehension assist level: Understands basic 75 - 89% of the time/ requires cueing 10 - 24% of the time  Expression   Expression assist level: Expresses basic 75 - 89% of the time/requires cueing 10 - 24% of the time. Needs helper to occlude trach/needs to repeat words.  Social Interaction Social Interaction assist level: Interacts appropriately 50 - 74% of the time - May be physically or verbally inappropriate.  Problem Solving Problem solving assist level: Solves basic 25 - 49% of the time - needs direction more than half the time to initiate, plan or  complete simple activities  Memory Memory assist level: Recognizes or recalls 25 - 49% of the time/requires cueing 50 - 75% of the time    Pain Pain Assessment Pain Assessment: No/denies pain  Therapy/Group: Individual Therapy  Zuleyka Kloc 03/19/2015, 4:07  PM

## 2015-03-19 NOTE — Progress Notes (Signed)
Occupational Therapy Session Note  Patient Details  Name: Juan [name] MRN: [id number] Date of Birth: 06/03/1990  Today's Date: 03/19/2015 OT Individual Time: 0900-1000 OT Individual Time Calculation (min): 60 min    Short Term Goals: Week 4:  OT Short Term Goal 1 (Week 4): Pt will perform shower transfers with max A - squat pivot OT Short Term Goal 1 - Progress (Week 4): Met OT Short Term Goal 2 (Week 4): Pt will perform BSC transfers with max A - squat pivot OT Short Term Goal 2 - Progress (Week 4): Met OT Short Term Goal 3 (Week 4): Pt will perform LB dressing tasks with max A OT Short Term Goal 3 - Progress (Week 4): Met OT Short Term Goal 4 (Week 4): Pt will perform toileting tasks with max A OT Short Term Goal 4 - Progress (Week 4): Met  Week 5: STG=LTG secondary to ELOS  Skilled Therapeutic Interventions/Progress Updates:    Pt resting in w/c upon arrival with mother present.  Pt stated he wanted to walk.  Pt's mom stated pt had already bathed and his clothing was clean.  Discussed how much assistance pt required during bathing/dressing tasks (noted in Functional Status). Pt's Mom pleased with progress and is comfortable with assisting pt appropriately after discharge.  Pt practiced amb with RW to bathroom and practiced toileting funcitons. Pt transitioned to therapy gym and amb with RW to therapy mat and engaged in standing tasks while using unilateral UE to complete activities.  Pt also engaged in pipe tree tasks while seated.  Pt continues to exhibit a left inattention requiring max verbal cues to attend to left when assembling structures.  Pt noted with continued motor apraxia and perseveration, although improved. Pt transitioned to sit<>stand tasks with emphasis on controlled movements and balanced weight distribution while standing.  Pt returned to room with mom present.    Therapy Documentation Precautions:  Precautions Precautions: Fall Precaution Comments: BLE NWB;  trach, peg Required Braces or Orthoses: Other Brace/Splint Other Brace/Splint: B resting hand splints; B PRAFO Restrictions Weight Bearing Restrictions: Yes RLE Weight Bearing: Weight bearing as tolerated LLE Weight Bearing: Weight bearing as tolerated Other Position/Activity Restrictions: 10/21 now WBAT through BLE and no ROM restrictions Pain:  Pt denied pain ADL: ADL ADL Comments: see FIM  See Function Navigator for Current Functional Status.   Therapy/Group: Individual Therapy  Leroy Libman 03/19/2015, 10:03 AM

## 2015-03-19 NOTE — Progress Notes (Signed)
Patient/family called out for RN close to beginning of shift, patient complaining of bilateral foot tingling/pain.  Mother voicing concerns of this "sudden foot pain", increasing tremors in arms (especially right upper extremity), and medication changes.  Discussed with patient and mother changes of the past few days of medications and encouraged to talk with MD in AM to further assess.  Scheduled pain medication given and successful. Will continue to monitor.

## 2015-03-19 NOTE — Plan of Care (Signed)
Problem: RH Memory Goal: LTG Patient will demonstrate ability for day to day (OT) LTG: Patient will demonstrate ability for day to day recall/carryover during activities of daily living with assist (OT)  Outcome: Not Met (add Reason) Max assist     

## 2015-03-19 NOTE — Plan of Care (Signed)
Problem: RH BLADDER ELIMINATION Goal: RH STG MANAGE BLADDER WITH ASSISTANCE STG Manage Bladder With moderate Assistance  Outcome: Progressing Still experiences incontinent episodes

## 2015-03-19 NOTE — Plan of Care (Signed)
Problem: RH Awareness Goal: LTG: Patient will demonstrate intellectual/emergent (OT) LTG: Patient will demonstrate intellectual/emergent/anticipatory awareness with assist during a functional activity (OT)  Outcome: Not Met (add Reason) Max A

## 2015-03-19 NOTE — Progress Notes (Signed)
Occupational Therapy Discharge Summary  Patient Details  Name: Juan [name] MRN: [id number] Date of Birth: 1991/04/12   Patient has met 11 of 13 long term goals due to improved activity tolerance, improved balance, postural control, ability to compensate for deficits and functional use of  RIGHT upper, RIGHT lower, LEFT upper and LEFT lower extremity.  Pt made steady progress with BADLs during this admission.  Pt continues to require mod verbal cues for sustained attention, max verbal cues for intellectual awareness and memory recall.  Pt is mod A for bathing, LB dressing, functional transfers, and toileting tasks.  Pt requires mod verbal cues for task initiation and sequencing.  Pt continues to exhibit a left inattention requiring mod verbal cues to attend to left during functional tasks.  Pt's mom, grandmother, and girlfriend have been present during therapy sessions and provided appropriate level of assistance.  Patient to discharge at overall Mod Assist level.  Patient's care partner is independent to provide the necessary physical and cognitive assistance at discharge.    Reasons goals not met: Pt still requires max A for for memory and max A for intellectual awareness. Pt with poor carry over with functional tasks requiring max A; family able to provide this level of cognitive assistance.    Recommendation:  Patient will benefit from ongoing skilled OT services in home health setting to continue to advance functional skills in the area of BADL, iADL and Reduce care partner burden.  Equipment: No equipment provided Pt's family purchased shower seate and BSC from local source in [place].  Reasons for discharge: lack of progress toward goals, treatment goals met and discharge from hospital  Patient/family agrees with progress made and goals achieved: Yes  OT Discharge ADL ADL ADL Comments: see FIM Vision/Perception  Vision- History Baseline Vision/History: Wears glasses Wears  Glasses: At all times Patient Visual Report: Other (comment) (no change but unable to accurately determine)  Cognition Overall Cognitive Status: Impaired/Different from baseline Arousal/Alertness: Awake/alert Orientation Level: Oriented to person;Oriented to place;Oriented to situation Attention: Focused;Sustained Focused Attention: Appears intact Focused Attention Impairment: Verbal basic;Functional basic Sustained Attention: Impaired Sustained Attention Impairment: Functional basic Memory: Impaired Awareness: Impaired Awareness Impairment: Intellectual impairment;Emergent impairment Problem Solving: Impaired Problem Solving Impairment: Functional basic Safety/Judgment: Impaired Sensation Sensation Light Touch: Appears Intact Stereognosis: Impaired by gross assessment Hot/Cold: Appears Intact Proprioception: Impaired by gross assessment Coordination Gross Motor Movements are Fluid and Coordinated: No Fine Motor Movements are Fluid and Coordinated: No     Trunk/Postural Assessment  Cervical Assessment Cervical Assessment: Within Functional Limits Thoracic Assessment Thoracic Assessment: Within Functional Limits Lumbar Assessment Lumbar Assessment: Within Functional Limits Postural Control Postural Control: Deficits on evaluation  Balance Static Sitting Balance Static Sitting - Balance Support: No upper extremity supported;Feet unsupported Static Sitting - Level of Assistance: 5: Stand by assistance Dynamic Sitting Balance Sitting balance - Comments: steady A Extremity/Trunk Assessment RUE Assessment RUE Assessment: Within Functional Limits LUE Assessment LUE Assessment: Within Functional Limits   See Function Navigator for Current Functional Status.  Leotis Shames [facility] 03/19/2015, 3:33 PM

## 2015-03-19 NOTE — Discharge Summary (Signed)
Discharge summary job # [id number]

## 2015-03-19 NOTE — Discharge Instructions (Signed)
Inpatient Rehab Discharge Instructions  Juan PilotJacob [name] XXXHunt Discharge date and time: No discharge date for patient encounter.   Activities/Precautions/ Functional Status: Activity: activity as tolerated Diet: soft Wound Care: keep wound clean and dry Functional status:  ___ No restrictions     ___ Walk up steps independently _x__ 24/7 supervision/assistance   ___ Walk up steps with assistance ___ Intermittent supervision/assistance  ___ Bathe/dress independently ___ Walk with walker     ___ Bathe/dress with assistance ___ Walk Independently    ___ Shower independently _x__ Walk with assistance    ___ Shower with assistance ___ No alcohol     ___ Return to work/school ________    COMMUNITY REFERRALS UPON DISCHARGE:    Home Health:   PT     OT     ST                            Agency:  [facility] Phone:  [phone]   Medical Equipment/Items Ordered: wheelchair and cushion                                                     Agency/SupplierMolly [name]:  [employer] @ [phone]       Special Instructions:    My questions have been answered and I understand these instructions. I will adhere to these goals and the provided educational materials after my discharge from the hospital.  Patient/Caregiver Signature _______________________________ Date __________  Clinician Signature _______________________________________ Date __________  Please bring this form and your medication list with you to all your follow-up doctor'[name] appointments.

## 2015-03-19 NOTE — Progress Notes (Signed)
Speech Language Pathology Discharge Summary  Patient Details  Name: Juan [name] MRN: [id number] Date of Birth: 10-20-1990   Patient has met 8 of 8 long term goals.  Patient to discharge at overall Mod;Max level.   Reasons goals not met: N/A   Clinical Impression/Discharge Summary: Patient has made functional gains and has met 8 of 8 LTG's this admission due to increased cognitive-linguistic and swallowing function. Currently, patient is consuming regular textures with thin liquids without overt s/s of aspiration and requires Supervision-Min A multimodal cues for use of swallowing compensatory strategies. Patient is verbalizing at the phrase and sentence level in 90% of opportunities but remains aphonic, suspect due to cognitive impairments.  Patient also requires overall Mod-Max A multimodal cues for orientation, sustained attention, intellectual awareness of deficits, functional problem solving, recall and safety awareness. Family education is complete and patient will discharge home with 24 hour supervision from family. Patient would benefit from f/u skilled SLP intervention in a home health setting to maximize his cognitive function and functional communication in order to maximize his overall functional independence and reduce caregiver burden.   Care Partner:  Caregiver Able to Provide Assistance: Yes  Type of Caregiver Assistance: Physical;Cognitive  Recommendation:  Home Health SLP;24 hour supervision/assistance  Rationale for SLP Follow Up: Maximize functional communication;Maximize cognitive function and independence;Reduce caregiver burden   Equipment: N/A   Reasons for discharge: Treatment goals met;Discharged from hospital   Patient/Family Agrees with Progress Made and Goals Achieved: Yes   Cannon, Frederick 03/19/2015, 4:26 PM
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
## 2015-03-20 MED ORDER — QUETIAPINE FUMARATE 50 MG PO TABS: 50.0000 mg | ORAL_TABLET | Freq: Every day | ORAL | Status: DC
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
## 2015-03-20 NOTE — Progress Notes (Signed)
Social Work  Discharge Note  The overall goal for the admission was met for:   Discharge location: Yes - home with parents in [place]  Length of Stay: Yes - 35 days  Discharge activity level: Yes - minimal assistance overall  Home/community participation: Yes  Services provided included: MD, RD, PT, OT, SLP, RN, TR, Pharmacy and [employer]: Private Insurance: [facility]  Follow-up services arranged: Home Health: PT, OT, ST via [facility], DME: 16x16 lightweight w/c, cushion via [employer] and Patient/Family has no preference for HH/DME agencies  Comments (or additional information):  Patient/Family verbalized understanding of follow-up arrangements: Yes  Individual responsible for coordination of the follow-up plan: pt/ mother  Confirmed correct DME delivered: Lennart Pall 03/20/2015    Marcellene Shivley

## 2015-03-20 NOTE — Progress Notes (Signed)
Patient discharged to home accompanied by his family. 

## 2015-04-23 ENCOUNTER — Ambulatory Visit: Payer: 59 | Attending: Neurology

## 2015-04-23 DIAGNOSIS — H5500 Unspecified nystagmus: Secondary | ICD-10-CM | POA: Insufficient documentation

## 2015-04-23 DIAGNOSIS — Z87820 Personal history of traumatic brain injury: Secondary | ICD-10-CM | POA: Insufficient documentation

## 2015-04-23 DIAGNOSIS — G959 Disease of spinal cord, unspecified: Secondary | ICD-10-CM | POA: Insufficient documentation

## 2015-04-23 DIAGNOSIS — R270 Ataxia, unspecified: Secondary | ICD-10-CM | POA: Insufficient documentation

## 2015-04-23 DIAGNOSIS — G40909 Epilepsy, unspecified, not intractable, without status epilepticus: Secondary | ICD-10-CM | POA: Insufficient documentation

## 2015-04-23 DIAGNOSIS — R413 Other amnesia: Secondary | ICD-10-CM | POA: Insufficient documentation
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
## 2015-05-02 ENCOUNTER — Ambulatory Visit: Payer: 59 | Attending: Neurology

## 2015-05-02 ENCOUNTER — Other Ambulatory Visit: Payer: Self-pay | Admitting: Neurology

## 2015-05-02 DIAGNOSIS — G959 Disease of spinal cord, unspecified: Secondary | ICD-10-CM | POA: Insufficient documentation

## 2015-05-02 MED ORDER — GADOBUTROL 1 MMOL/ML IV SOLN
6.5000 mL | Freq: Once | INTRAVENOUS | Status: AC | PRN
Start: 2015-05-02 — End: 2015-05-02
  Administered 2015-05-02: 6.5 mmol via INTRAVENOUS
  Filled 2015-05-02: qty 7.5
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
## 2015-05-21 ENCOUNTER — Ambulatory Visit: Payer: 59 | Attending: Neurology

## 2015-06-27 ENCOUNTER — Encounter (HOSPITAL_COMMUNITY): Payer: Self-pay | Admitting: Speech Pathology

## 2015-06-27 ENCOUNTER — Ambulatory Visit (HOSPITAL_COMMUNITY): Payer: Commercial Managed Care - PPO | Attending: Family Medicine | Admitting: Speech Pathology

## 2015-06-27 DIAGNOSIS — Z789 Other specified health status: Secondary | ICD-10-CM | POA: Diagnosis present

## 2015-06-27 DIAGNOSIS — F09 Unspecified mental disorder due to known physiological condition: Secondary | ICD-10-CM | POA: Insufficient documentation

## 2015-06-27 DIAGNOSIS — M79604 Pain in right leg: Secondary | ICD-10-CM | POA: Diagnosis present

## 2015-06-27 DIAGNOSIS — G729 Myopathy, unspecified: Secondary | ICD-10-CM | POA: Insufficient documentation

## 2015-06-27 DIAGNOSIS — M79605 Pain in left leg: Secondary | ICD-10-CM | POA: Insufficient documentation

## 2015-06-27 DIAGNOSIS — S069X0S Unspecified intracranial injury without loss of consciousness, sequela: Secondary | ICD-10-CM | POA: Diagnosis present

## 2015-06-27 DIAGNOSIS — Z658 Other specified problems related to psychosocial circumstances: Secondary | ICD-10-CM | POA: Diagnosis present

## 2015-06-27 DIAGNOSIS — S0990XS Unspecified injury of head, sequela: Secondary | ICD-10-CM | POA: Insufficient documentation

## 2015-06-27 DIAGNOSIS — R2681 Unsteadiness on feet: Secondary | ICD-10-CM | POA: Insufficient documentation

## 2015-06-27 DIAGNOSIS — M6281 Muscle weakness (generalized): Secondary | ICD-10-CM | POA: Diagnosis present

## 2015-06-27 DIAGNOSIS — M25662 Stiffness of left knee, not elsewhere classified: Secondary | ICD-10-CM | POA: Diagnosis present

## 2015-06-27 DIAGNOSIS — R29898 Other symptoms and signs involving the musculoskeletal system: Secondary | ICD-10-CM | POA: Diagnosis present

## 2015-06-27 DIAGNOSIS — X58XXXS Exposure to other specified factors, sequela: Secondary | ICD-10-CM | POA: Insufficient documentation

## 2015-06-27 DIAGNOSIS — R279 Unspecified lack of coordination: Secondary | ICD-10-CM | POA: Insufficient documentation
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
## 2015-06-28 ENCOUNTER — Ambulatory Visit (HOSPITAL_COMMUNITY): Payer: Commercial Managed Care - PPO | Admitting: Occupational Therapy

## 2015-06-28 ENCOUNTER — Encounter (HOSPITAL_COMMUNITY): Payer: Self-pay | Admitting: Occupational Therapy

## 2015-06-28 DIAGNOSIS — M6281 Muscle weakness (generalized): Secondary | ICD-10-CM

## 2015-06-28 DIAGNOSIS — Z789 Other specified health status: Secondary | ICD-10-CM

## 2015-06-28 DIAGNOSIS — S069X0S Unspecified intracranial injury without loss of consciousness, sequela: Secondary | ICD-10-CM

## 2015-06-28 DIAGNOSIS — R279 Unspecified lack of coordination: Secondary | ICD-10-CM

## 2015-06-28 DIAGNOSIS — R29898 Other symptoms and signs involving the musculoskeletal system: Secondary | ICD-10-CM

## 2015-06-28 NOTE — Patient Instructions (Signed)
1) Shoulder Protraction    Begin with elbows by your side, slowly "punch" straight out in front of you keeping arms/elbows straight.      2) Shoulder Flexion  Supine:     Standing:         Begin with arms at your side with thumbs pointed up, slowly raise both arms up and forward towards overhead.         3) Horizontal abduction/adduction  Supine:   Standing:           Begin with arms straight out in front of you, bring out to the side in at "T" shape. Keep arms straight entire time.         4) Internal & External Rotation    *No band* -Stand with elbows at the side and elbows bent 90 degrees. Move your forearms away from your body, then bring back inward toward the body.     5) Shoulder Abduction  Supine:     Standing:       Lying on your back begin with your arms flat on the table next to your side. Slowly move your arms out to the side so that they go overhead, in a jumping jack or snow angel movement.      Repeat all exercises 10-15 times, 1-2 times per day.  
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
## 2015-07-03 ENCOUNTER — Encounter (HOSPITAL_COMMUNITY): Payer: Self-pay

## 2015-07-03 ENCOUNTER — Ambulatory Visit (HOSPITAL_COMMUNITY): Payer: Commercial Managed Care - PPO

## 2015-07-03 ENCOUNTER — Ambulatory Visit (HOSPITAL_COMMUNITY): Payer: Commercial Managed Care - PPO | Admitting: Speech Pathology

## 2015-07-03 ENCOUNTER — Encounter (HOSPITAL_COMMUNITY): Payer: Self-pay | Admitting: Speech Pathology

## 2015-07-03 DIAGNOSIS — R29898 Other symptoms and signs involving the musculoskeletal system: Secondary | ICD-10-CM

## 2015-07-03 DIAGNOSIS — M6281 Muscle weakness (generalized): Secondary | ICD-10-CM

## 2015-07-03 DIAGNOSIS — F09 Unspecified mental disorder due to known physiological condition: Secondary | ICD-10-CM

## 2015-07-03 DIAGNOSIS — S069X0S Unspecified intracranial injury without loss of consciousness, sequela: Secondary | ICD-10-CM | POA: Diagnosis not present

## 2015-07-03 DIAGNOSIS — S0990XS Unspecified injury of head, sequela: Principal | ICD-10-CM

## 2015-07-03 DIAGNOSIS — R279 Unspecified lack of coordination: Secondary | ICD-10-CM
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
## 2015-07-04 ENCOUNTER — Ambulatory Visit (HOSPITAL_COMMUNITY): Payer: Commercial Managed Care - PPO

## 2015-07-04 ENCOUNTER — Ambulatory Visit (HOSPITAL_COMMUNITY): Payer: Commercial Managed Care - PPO | Admitting: Speech Pathology

## 2015-07-04 ENCOUNTER — Encounter (HOSPITAL_COMMUNITY): Payer: Self-pay

## 2015-07-04 DIAGNOSIS — M6281 Muscle weakness (generalized): Secondary | ICD-10-CM

## 2015-07-04 DIAGNOSIS — S069X0S Unspecified intracranial injury without loss of consciousness, sequela: Secondary | ICD-10-CM | POA: Diagnosis not present

## 2015-07-04 DIAGNOSIS — S0990XS Unspecified injury of head, sequela: Principal | ICD-10-CM

## 2015-07-04 DIAGNOSIS — F09 Unspecified mental disorder due to known physiological condition: Secondary | ICD-10-CM
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
## 2015-07-04 NOTE — Patient Instructions (Signed)
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
## 2015-07-05 ENCOUNTER — Other Ambulatory Visit (HOSPITAL_COMMUNITY): Payer: Self-pay | Admitting: Neurology

## 2015-07-05 DIAGNOSIS — I6389 Other cerebral infarction: Secondary | ICD-10-CM

## 2015-07-10 ENCOUNTER — Ambulatory Visit (HOSPITAL_COMMUNITY): Payer: Commercial Managed Care - PPO | Admitting: Speech Pathology

## 2015-07-10 ENCOUNTER — Encounter (HOSPITAL_COMMUNITY): Payer: Self-pay | Admitting: Occupational Therapy

## 2015-07-10 ENCOUNTER — Ambulatory Visit (HOSPITAL_COMMUNITY): Payer: Commercial Managed Care - PPO | Admitting: Occupational Therapy

## 2015-07-10 DIAGNOSIS — M6281 Muscle weakness (generalized): Secondary | ICD-10-CM

## 2015-07-10 DIAGNOSIS — S069X0S Unspecified intracranial injury without loss of consciousness, sequela: Secondary | ICD-10-CM | POA: Diagnosis not present

## 2015-07-10 DIAGNOSIS — S0990XS Unspecified injury of head, sequela: Principal | ICD-10-CM

## 2015-07-10 DIAGNOSIS — F09 Unspecified mental disorder due to known physiological condition: Secondary | ICD-10-CM
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
## 2015-07-11 ENCOUNTER — Ambulatory Visit (HOSPITAL_COMMUNITY): Payer: Commercial Managed Care - PPO

## 2015-07-11 ENCOUNTER — Encounter (HOSPITAL_COMMUNITY): Payer: Self-pay

## 2015-07-11 DIAGNOSIS — R279 Unspecified lack of coordination: Secondary | ICD-10-CM

## 2015-07-11 DIAGNOSIS — M6281 Muscle weakness (generalized): Secondary | ICD-10-CM

## 2015-07-11 DIAGNOSIS — R2681 Unsteadiness on feet: Secondary | ICD-10-CM

## 2015-07-11 DIAGNOSIS — M79604 Pain in right leg: Secondary | ICD-10-CM

## 2015-07-11 DIAGNOSIS — S069X0S Unspecified intracranial injury without loss of consciousness, sequela: Secondary | ICD-10-CM | POA: Diagnosis not present

## 2015-07-11 DIAGNOSIS — R29898 Other symptoms and signs involving the musculoskeletal system: Secondary | ICD-10-CM

## 2015-07-11 DIAGNOSIS — M6289 Other specified disorders of muscle: Secondary | ICD-10-CM

## 2015-07-11 DIAGNOSIS — R6889 Other general symptoms and signs: Secondary | ICD-10-CM

## 2015-07-11 DIAGNOSIS — Z789 Other specified health status: Secondary | ICD-10-CM

## 2015-07-11 DIAGNOSIS — M25662 Stiffness of left knee, not elsewhere classified: Secondary | ICD-10-CM

## 2015-07-11 DIAGNOSIS — M79605 Pain in left leg: Secondary | ICD-10-CM
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
## 2015-07-16 ENCOUNTER — Ambulatory Visit (HOSPITAL_COMMUNITY): Payer: Commercial Managed Care - PPO | Admitting: Physical Therapy

## 2015-07-16 DIAGNOSIS — R2681 Unsteadiness on feet: Secondary | ICD-10-CM

## 2015-07-16 DIAGNOSIS — M6289 Other specified disorders of muscle: Secondary | ICD-10-CM

## 2015-07-16 DIAGNOSIS — R279 Unspecified lack of coordination: Secondary | ICD-10-CM

## 2015-07-16 DIAGNOSIS — Z789 Other specified health status: Secondary | ICD-10-CM

## 2015-07-16 DIAGNOSIS — S069X0S Unspecified intracranial injury without loss of consciousness, sequela: Secondary | ICD-10-CM | POA: Diagnosis not present

## 2015-07-16 DIAGNOSIS — R29898 Other symptoms and signs involving the musculoskeletal system: Secondary | ICD-10-CM

## 2015-07-16 DIAGNOSIS — M6281 Muscle weakness (generalized): Secondary | ICD-10-CM

## 2015-07-16 DIAGNOSIS — R6889 Other general symptoms and signs: Secondary | ICD-10-CM
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
## 2015-07-16 NOTE — Patient Instructions (Signed)
   SEATED CALF STRETCH - GASTROC  While sitting, use a towel or other strap looped around your foot. Gently pull your ankle back until a stretch is felt along the back of your lower leg. Keep knee straight. Hold 20-30 seconds, repeat 3 times

## 2015-07-17 ENCOUNTER — Encounter (HOSPITAL_COMMUNITY): Payer: Self-pay | Admitting: Speech Pathology

## 2015-07-17 ENCOUNTER — Ambulatory Visit (HOSPITAL_COMMUNITY): Payer: Commercial Managed Care - PPO | Admitting: Speech Pathology

## 2015-07-17 ENCOUNTER — Ambulatory Visit (HOSPITAL_COMMUNITY): Payer: Commercial Managed Care - PPO

## 2015-07-17 ENCOUNTER — Encounter (HOSPITAL_COMMUNITY): Payer: Self-pay

## 2015-07-17 DIAGNOSIS — M6281 Muscle weakness (generalized): Secondary | ICD-10-CM

## 2015-07-17 DIAGNOSIS — S0990XS Unspecified injury of head, sequela: Principal | ICD-10-CM

## 2015-07-17 DIAGNOSIS — R279 Unspecified lack of coordination: Secondary | ICD-10-CM

## 2015-07-17 DIAGNOSIS — F09 Unspecified mental disorder due to known physiological condition: Secondary | ICD-10-CM

## 2015-07-17 DIAGNOSIS — S069X0S Unspecified intracranial injury without loss of consciousness, sequela: Secondary | ICD-10-CM | POA: Diagnosis not present

## 2015-07-17 DIAGNOSIS — R29898 Other symptoms and signs involving the musculoskeletal system: Secondary | ICD-10-CM
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
## 2015-07-19 ENCOUNTER — Ambulatory Visit (HOSPITAL_COMMUNITY)
Admission: RE | Admit: 2015-07-19 | Discharge: 2015-07-19 | Disposition: A | Discharge status: Home / Self Care | Payer: Commercial Managed Care - PPO | Source: Ambulatory Visit | Attending: Neurology | Admitting: Neurology

## 2015-07-19 DIAGNOSIS — R930 Abnormal findings on diagnostic imaging of skull and head, not elsewhere classified: Secondary | ICD-10-CM | POA: Insufficient documentation

## 2015-07-19 DIAGNOSIS — I6389 Other cerebral infarction: Secondary | ICD-10-CM

## 2015-07-19 DIAGNOSIS — I638 Other cerebral infarction: Secondary | ICD-10-CM | POA: Insufficient documentation

## 2015-07-23 ENCOUNTER — Ambulatory Visit (HOSPITAL_COMMUNITY): Payer: Self-pay | Admitting: Physical Therapy

## 2015-07-24 ENCOUNTER — Encounter (HOSPITAL_COMMUNITY): Payer: Self-pay | Admitting: Speech Pathology

## 2015-07-24 ENCOUNTER — Encounter (HOSPITAL_COMMUNITY): Payer: Self-pay

## 2015-07-24 ENCOUNTER — Ambulatory Visit (HOSPITAL_COMMUNITY): Payer: Commercial Managed Care - PPO | Admitting: Speech Pathology

## 2015-07-24 ENCOUNTER — Encounter (HOSPITAL_COMMUNITY): Payer: Self-pay | Admitting: Occupational Therapy

## 2015-07-24 ENCOUNTER — Ambulatory Visit (HOSPITAL_COMMUNITY): Payer: Commercial Managed Care - PPO

## 2015-07-24 ENCOUNTER — Ambulatory Visit (HOSPITAL_COMMUNITY): Payer: Commercial Managed Care - PPO | Attending: Family Medicine | Admitting: Occupational Therapy

## 2015-07-24 DIAGNOSIS — T148 Other injury of unspecified body region: Secondary | ICD-10-CM | POA: Diagnosis present

## 2015-07-24 DIAGNOSIS — M79605 Pain in left leg: Secondary | ICD-10-CM | POA: Insufficient documentation

## 2015-07-24 DIAGNOSIS — M25662 Stiffness of left knee, not elsewhere classified: Secondary | ICD-10-CM | POA: Insufficient documentation

## 2015-07-24 DIAGNOSIS — M6281 Muscle weakness (generalized): Secondary | ICD-10-CM | POA: Diagnosis present

## 2015-07-24 DIAGNOSIS — R6889 Other general symptoms and signs: Secondary | ICD-10-CM

## 2015-07-24 DIAGNOSIS — S069X0S Unspecified intracranial injury without loss of consciousness, sequela: Secondary | ICD-10-CM | POA: Diagnosis present

## 2015-07-24 DIAGNOSIS — G729 Myopathy, unspecified: Secondary | ICD-10-CM | POA: Insufficient documentation

## 2015-07-24 DIAGNOSIS — S0990XS Unspecified injury of head, sequela: Secondary | ICD-10-CM

## 2015-07-24 DIAGNOSIS — M79604 Pain in right leg: Secondary | ICD-10-CM | POA: Insufficient documentation

## 2015-07-24 DIAGNOSIS — Z09 Encounter for follow-up examination after completed treatment for conditions other than malignant neoplasm: Secondary | ICD-10-CM | POA: Insufficient documentation

## 2015-07-24 DIAGNOSIS — Z789 Other specified health status: Secondary | ICD-10-CM

## 2015-07-24 DIAGNOSIS — F09 Unspecified mental disorder due to known physiological condition: Secondary | ICD-10-CM | POA: Insufficient documentation

## 2015-07-24 DIAGNOSIS — Z658 Other specified problems related to psychosocial circumstances: Secondary | ICD-10-CM | POA: Insufficient documentation

## 2015-07-24 DIAGNOSIS — M6289 Other specified disorders of muscle: Secondary | ICD-10-CM

## 2015-07-24 DIAGNOSIS — R279 Unspecified lack of coordination: Secondary | ICD-10-CM | POA: Insufficient documentation

## 2015-07-24 DIAGNOSIS — R29898 Other symptoms and signs involving the musculoskeletal system: Secondary | ICD-10-CM | POA: Insufficient documentation

## 2015-07-24 DIAGNOSIS — R2681 Unsteadiness on feet: Secondary | ICD-10-CM
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
## 2015-07-25 ENCOUNTER — Encounter: Payer: Self-pay | Admitting: Physical Medicine & Rehabilitation

## 2015-07-25 ENCOUNTER — Telehealth: Payer: Self-pay | Admitting: Physical Medicine & Rehabilitation

## 2015-07-25 ENCOUNTER — Encounter
Payer: Commercial Managed Care - PPO | Attending: Physical Medicine & Rehabilitation | Admitting: Physical Medicine & Rehabilitation

## 2015-07-25 DIAGNOSIS — S7292XA Unspecified fracture of left femur, initial encounter for closed fracture: Secondary | ICD-10-CM | POA: Diagnosis not present

## 2015-07-25 DIAGNOSIS — S32019A Unspecified fracture of first lumbar vertebra, initial encounter for closed fracture: Secondary | ICD-10-CM | POA: Insufficient documentation

## 2015-07-25 DIAGNOSIS — T791XXS Fat embolism (traumatic), sequela: Secondary | ICD-10-CM | POA: Diagnosis not present

## 2015-07-25 DIAGNOSIS — G939 Disorder of brain, unspecified: Secondary | ICD-10-CM | POA: Insufficient documentation

## 2015-07-25 DIAGNOSIS — T791XXA Fat embolism (traumatic), initial encounter: Secondary | ICD-10-CM | POA: Insufficient documentation

## 2015-07-25 DIAGNOSIS — Z959 Presence of cardiac and vascular implant and graft, unspecified: Secondary | ICD-10-CM | POA: Insufficient documentation

## 2015-07-25 DIAGNOSIS — S2243XB Multiple fractures of ribs, bilateral, initial encounter for open fracture: Secondary | ICD-10-CM | POA: Diagnosis not present

## 2015-07-25 DIAGNOSIS — S32029A Unspecified fracture of second lumbar vertebra, initial encounter for closed fracture: Secondary | ICD-10-CM | POA: Diagnosis not present

## 2015-07-25 NOTE — Telephone Encounter (Signed)
Dabney,  Hi, this Ivory BroadZach Swartz. I am a physiatrist treating Mr. Juan [name] [facility] [place] [facility] [place] [place]. I saw him in my clinic today and family/pt are interested in driving. I wanted some feedback from his treatment team regarding cognitive and physical ability to do so. I told the patient and family that I am impressed with how far he has come, but that I would request that he participate in an [place] [facility] assessment before returning to the wheel---when we are at that point. Do you or his other therapists have any thoughts?  Thanks  Ivory BroadZach Swartz, MD

## 2015-07-25 NOTE — Patient Instructions (Signed)
Be more aggressive with anti-inflammatories. Try naproxen over the counter, 1-2 tabs in the morning with breakfast.
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
## 2015-07-26 ENCOUNTER — Ambulatory Visit (HOSPITAL_COMMUNITY): Payer: Commercial Managed Care - PPO

## 2015-07-26 ENCOUNTER — Other Ambulatory Visit: Payer: Self-pay | Admitting: Physical Medicine & Rehabilitation

## 2015-07-26 ENCOUNTER — Ambulatory Visit (HOSPITAL_COMMUNITY): Payer: Commercial Managed Care - PPO | Admitting: Occupational Therapy

## 2015-07-26 ENCOUNTER — Encounter (HOSPITAL_COMMUNITY): Payer: Self-pay | Admitting: Occupational Therapy

## 2015-07-26 ENCOUNTER — Ambulatory Visit (HOSPITAL_COMMUNITY): Payer: Commercial Managed Care - PPO | Admitting: Speech Pathology

## 2015-07-26 DIAGNOSIS — Z789 Other specified health status: Secondary | ICD-10-CM

## 2015-07-26 DIAGNOSIS — M79604 Pain in right leg: Secondary | ICD-10-CM

## 2015-07-26 DIAGNOSIS — R29898 Other symptoms and signs involving the musculoskeletal system: Secondary | ICD-10-CM

## 2015-07-26 DIAGNOSIS — F09 Unspecified mental disorder due to known physiological condition: Secondary | ICD-10-CM

## 2015-07-26 DIAGNOSIS — I82409 Acute embolism and thrombosis of unspecified deep veins of unspecified lower extremity: Secondary | ICD-10-CM

## 2015-07-26 DIAGNOSIS — M6289 Other specified disorders of muscle: Secondary | ICD-10-CM

## 2015-07-26 DIAGNOSIS — M25662 Stiffness of left knee, not elsewhere classified: Secondary | ICD-10-CM

## 2015-07-26 DIAGNOSIS — R2681 Unsteadiness on feet: Secondary | ICD-10-CM

## 2015-07-26 DIAGNOSIS — R279 Unspecified lack of coordination: Secondary | ICD-10-CM

## 2015-07-26 DIAGNOSIS — R6889 Other general symptoms and signs: Secondary | ICD-10-CM

## 2015-07-26 DIAGNOSIS — S069X0S Unspecified intracranial injury without loss of consciousness, sequela: Secondary | ICD-10-CM

## 2015-07-26 DIAGNOSIS — M6281 Muscle weakness (generalized): Secondary | ICD-10-CM

## 2015-07-26 DIAGNOSIS — S0990XS Unspecified injury of head, sequela: Principal | ICD-10-CM

## 2015-07-26 DIAGNOSIS — M79605 Pain in left leg: Secondary | ICD-10-CM
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
## 2015-07-26 NOTE — Patient Instructions (Addendum)
   STRAIGHT LEG RAISE - SLR  While lying or sitting, raise up your leg with a straight knee.  Keep the opposite knee bent with the foot planted to the ground.  1 sets of 15 reps, every day, 2x/day complete on both legs        SLR - Hip Extension  Lie on stomach.  Tighten the core muscles and glutes, then lift the leg keeping the knee locked.  Raise 2-6 inches.  Stop if the trunk arches or twists.  1 sets of 15 reps, every day, 2x/day complete on both legs       SLR - Adduction  Lie on the affected side with the uneffected leg positioned as shown.  Tighten the quadriceps and lock the knee. Lift the bottom (affected) leg a couple inches off the floor keeping the knee locked.  1 sets of 15 reps, every day, 2x/day complete on both legs      SLR abduction   Lie down on your side. Bend your bottom leg while keeping the top leg straight. Your top leg should be in line with your torso. Raise that leg up off the table and back down. Make sure that you don't lead the lift with your toe, but keep the outside of your foot flat and parellel with the ceiling. You should feel the muscle in your outer hip working.  1 sets of 15 reps, every day, 2x/day complete on both legs

## 2015-07-27 ENCOUNTER — Ambulatory Visit (INDEPENDENT_AMBULATORY_CARE_PROVIDER_SITE_OTHER): Payer: Commercial Managed Care - PPO | Admitting: Neurology

## 2015-07-27 ENCOUNTER — Encounter: Payer: Self-pay | Admitting: Neurology

## 2015-07-27 VITALS — BP 130/82 | HR 89 | Resp 16 | Wt 143.0 lb

## 2015-07-27 DIAGNOSIS — T791XXA Fat embolism (traumatic), initial encounter: Secondary | ICD-10-CM | POA: Insufficient documentation

## 2015-07-27 DIAGNOSIS — Z87820 Personal history of traumatic brain injury: Secondary | ICD-10-CM

## 2015-07-27 NOTE — Patient Instructions (Signed)
1. Continue PT, OT, and speech therapy 2. Schedule repeat Neuropsychological evaluation in December 3. Follow-up in 10 months, call for any changes
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
## 2015-07-30 ENCOUNTER — Ambulatory Visit (HOSPITAL_COMMUNITY): Payer: Self-pay | Admitting: Physical Therapy

## 2015-07-31 ENCOUNTER — Ambulatory Visit (HOSPITAL_COMMUNITY): Payer: Commercial Managed Care - PPO

## 2015-07-31 ENCOUNTER — Ambulatory Visit (HOSPITAL_COMMUNITY): Payer: Commercial Managed Care - PPO | Admitting: Speech Pathology

## 2015-07-31 ENCOUNTER — Encounter (HOSPITAL_COMMUNITY): Payer: Self-pay

## 2015-07-31 ENCOUNTER — Encounter (HOSPITAL_COMMUNITY): Payer: Self-pay | Admitting: Speech Pathology

## 2015-07-31 DIAGNOSIS — M25662 Stiffness of left knee, not elsewhere classified: Secondary | ICD-10-CM

## 2015-07-31 DIAGNOSIS — M79605 Pain in left leg: Secondary | ICD-10-CM

## 2015-07-31 DIAGNOSIS — S0990XS Unspecified injury of head, sequela: Principal | ICD-10-CM

## 2015-07-31 DIAGNOSIS — R6889 Other general symptoms and signs: Secondary | ICD-10-CM

## 2015-07-31 DIAGNOSIS — Z789 Other specified health status: Secondary | ICD-10-CM

## 2015-07-31 DIAGNOSIS — F09 Unspecified mental disorder due to known physiological condition: Secondary | ICD-10-CM

## 2015-07-31 DIAGNOSIS — M6289 Other specified disorders of muscle: Secondary | ICD-10-CM

## 2015-07-31 DIAGNOSIS — R29898 Other symptoms and signs involving the musculoskeletal system: Secondary | ICD-10-CM

## 2015-07-31 DIAGNOSIS — R2681 Unsteadiness on feet: Secondary | ICD-10-CM

## 2015-07-31 DIAGNOSIS — M6281 Muscle weakness (generalized): Secondary | ICD-10-CM

## 2015-07-31 DIAGNOSIS — S069X0S Unspecified intracranial injury without loss of consciousness, sequela: Secondary | ICD-10-CM

## 2015-07-31 DIAGNOSIS — M79604 Pain in right leg: Secondary | ICD-10-CM

## 2015-07-31 DIAGNOSIS — R279 Unspecified lack of coordination: Secondary | ICD-10-CM | POA: Diagnosis not present
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
## 2015-08-01 ENCOUNTER — Ambulatory Visit: Payer: Self-pay | Admitting: Physical Medicine & Rehabilitation

## 2015-08-03 ENCOUNTER — Encounter (HOSPITAL_COMMUNITY): Payer: Self-pay

## 2015-08-03 ENCOUNTER — Ambulatory Visit (HOSPITAL_COMMUNITY): Payer: Commercial Managed Care - PPO | Admitting: Physical Therapy

## 2015-08-03 ENCOUNTER — Ambulatory Visit (HOSPITAL_COMMUNITY): Payer: Commercial Managed Care - PPO

## 2015-08-03 DIAGNOSIS — R29898 Other symptoms and signs involving the musculoskeletal system: Secondary | ICD-10-CM

## 2015-08-03 DIAGNOSIS — R279 Unspecified lack of coordination: Secondary | ICD-10-CM

## 2015-08-03 DIAGNOSIS — R2681 Unsteadiness on feet: Secondary | ICD-10-CM

## 2015-08-03 DIAGNOSIS — M6281 Muscle weakness (generalized): Secondary | ICD-10-CM

## 2015-08-03 DIAGNOSIS — M25662 Stiffness of left knee, not elsewhere classified: Secondary | ICD-10-CM

## 2015-08-03 DIAGNOSIS — M6289 Other specified disorders of muscle: Secondary | ICD-10-CM
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
## 2015-08-06 ENCOUNTER — Ambulatory Visit (HOSPITAL_COMMUNITY): Payer: Self-pay

## 2015-08-07 ENCOUNTER — Encounter (HOSPITAL_COMMUNITY): Payer: Self-pay

## 2015-08-07 ENCOUNTER — Encounter (HOSPITAL_COMMUNITY): Payer: Self-pay | Admitting: Speech Pathology

## 2015-08-07 ENCOUNTER — Ambulatory Visit (HOSPITAL_COMMUNITY): Payer: Commercial Managed Care - PPO | Admitting: Speech Pathology

## 2015-08-07 ENCOUNTER — Ambulatory Visit (HOSPITAL_COMMUNITY): Payer: Commercial Managed Care - PPO

## 2015-08-07 DIAGNOSIS — M79604 Pain in right leg: Secondary | ICD-10-CM

## 2015-08-07 DIAGNOSIS — M6289 Other specified disorders of muscle: Secondary | ICD-10-CM

## 2015-08-07 DIAGNOSIS — R2681 Unsteadiness on feet: Secondary | ICD-10-CM

## 2015-08-07 DIAGNOSIS — Z789 Other specified health status: Secondary | ICD-10-CM

## 2015-08-07 DIAGNOSIS — R279 Unspecified lack of coordination: Secondary | ICD-10-CM

## 2015-08-07 DIAGNOSIS — R6889 Other general symptoms and signs: Secondary | ICD-10-CM

## 2015-08-07 DIAGNOSIS — F09 Unspecified mental disorder due to known physiological condition: Secondary | ICD-10-CM

## 2015-08-07 DIAGNOSIS — S0990XS Unspecified injury of head, sequela: Principal | ICD-10-CM

## 2015-08-07 DIAGNOSIS — R29898 Other symptoms and signs involving the musculoskeletal system: Secondary | ICD-10-CM

## 2015-08-07 DIAGNOSIS — S069X0S Unspecified intracranial injury without loss of consciousness, sequela: Secondary | ICD-10-CM

## 2015-08-07 DIAGNOSIS — M6281 Muscle weakness (generalized): Secondary | ICD-10-CM

## 2015-08-07 DIAGNOSIS — M79605 Pain in left leg: Secondary | ICD-10-CM

## 2015-08-07 DIAGNOSIS — M25662 Stiffness of left knee, not elsewhere classified: Secondary | ICD-10-CM
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
## 2015-08-08 ENCOUNTER — Ambulatory Visit
Admission: RE | Admit: 2015-08-08 | Discharge: 2015-08-08 | Disposition: A | Discharge status: Home / Self Care | Payer: Commercial Managed Care - PPO | Source: Ambulatory Visit | Attending: Physical Medicine & Rehabilitation | Admitting: Physical Medicine & Rehabilitation

## 2015-08-08 ENCOUNTER — Other Ambulatory Visit: Payer: Self-pay | Admitting: Physical Medicine & Rehabilitation

## 2015-08-08 DIAGNOSIS — I82409 Acute embolism and thrombosis of unspecified deep veins of unspecified lower extremity: Secondary | ICD-10-CM
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
## 2015-08-09 ENCOUNTER — Encounter (HOSPITAL_COMMUNITY): Payer: Self-pay | Admitting: Speech Pathology

## 2015-08-09 ENCOUNTER — Ambulatory Visit (HOSPITAL_COMMUNITY): Payer: Commercial Managed Care - PPO

## 2015-08-09 ENCOUNTER — Encounter (HOSPITAL_COMMUNITY): Payer: Self-pay

## 2015-08-09 ENCOUNTER — Ambulatory Visit (HOSPITAL_COMMUNITY): Payer: Commercial Managed Care - PPO | Admitting: Speech Pathology

## 2015-08-09 ENCOUNTER — Other Ambulatory Visit (HOSPITAL_COMMUNITY): Payer: Self-pay | Admitting: Interventional Radiology

## 2015-08-09 DIAGNOSIS — M79604 Pain in right leg: Secondary | ICD-10-CM

## 2015-08-09 DIAGNOSIS — R29898 Other symptoms and signs involving the musculoskeletal system: Secondary | ICD-10-CM

## 2015-08-09 DIAGNOSIS — T148XXA Other injury of unspecified body region, initial encounter: Secondary | ICD-10-CM

## 2015-08-09 DIAGNOSIS — M6281 Muscle weakness (generalized): Secondary | ICD-10-CM

## 2015-08-09 DIAGNOSIS — Z09 Encounter for follow-up examination after completed treatment for conditions other than malignant neoplasm: Secondary | ICD-10-CM

## 2015-08-09 DIAGNOSIS — S0990XS Unspecified injury of head, sequela: Principal | ICD-10-CM

## 2015-08-09 DIAGNOSIS — R2681 Unsteadiness on feet: Secondary | ICD-10-CM

## 2015-08-09 DIAGNOSIS — M79605 Pain in left leg: Secondary | ICD-10-CM

## 2015-08-09 DIAGNOSIS — F09 Unspecified mental disorder due to known physiological condition: Secondary | ICD-10-CM

## 2015-08-09 DIAGNOSIS — M6289 Other specified disorders of muscle: Secondary | ICD-10-CM

## 2015-08-09 DIAGNOSIS — M25662 Stiffness of left knee, not elsewhere classified: Secondary | ICD-10-CM

## 2015-08-09 DIAGNOSIS — R279 Unspecified lack of coordination: Secondary | ICD-10-CM

## 2015-08-09 DIAGNOSIS — S069X0S Unspecified intracranial injury without loss of consciousness, sequela: Secondary | ICD-10-CM
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
## 2015-08-13 ENCOUNTER — Ambulatory Visit (HOSPITAL_COMMUNITY): Payer: Self-pay

## 2015-08-14 ENCOUNTER — Encounter (HOSPITAL_COMMUNITY): Payer: Self-pay | Admitting: Speech Pathology

## 2015-08-14 ENCOUNTER — Ambulatory Visit (HOSPITAL_COMMUNITY): Payer: Commercial Managed Care - PPO | Admitting: Occupational Therapy

## 2015-08-14 ENCOUNTER — Ambulatory Visit (HOSPITAL_COMMUNITY): Payer: Commercial Managed Care - PPO

## 2015-08-14 ENCOUNTER — Ambulatory Visit (HOSPITAL_COMMUNITY): Payer: Commercial Managed Care - PPO | Admitting: Speech Pathology

## 2015-08-14 ENCOUNTER — Encounter (HOSPITAL_COMMUNITY): Payer: Self-pay

## 2015-08-14 ENCOUNTER — Encounter (HOSPITAL_COMMUNITY): Payer: Self-pay | Admitting: Occupational Therapy

## 2015-08-14 DIAGNOSIS — M6281 Muscle weakness (generalized): Secondary | ICD-10-CM

## 2015-08-14 DIAGNOSIS — T148XXA Other injury of unspecified body region, initial encounter: Secondary | ICD-10-CM

## 2015-08-14 DIAGNOSIS — S0990XS Unspecified injury of head, sequela: Principal | ICD-10-CM

## 2015-08-14 DIAGNOSIS — S069X0S Unspecified intracranial injury without loss of consciousness, sequela: Secondary | ICD-10-CM

## 2015-08-14 DIAGNOSIS — R29898 Other symptoms and signs involving the musculoskeletal system: Secondary | ICD-10-CM

## 2015-08-14 DIAGNOSIS — M79605 Pain in left leg: Secondary | ICD-10-CM

## 2015-08-14 DIAGNOSIS — F09 Unspecified mental disorder due to known physiological condition: Secondary | ICD-10-CM

## 2015-08-14 DIAGNOSIS — M6289 Other specified disorders of muscle: Secondary | ICD-10-CM

## 2015-08-14 DIAGNOSIS — M25662 Stiffness of left knee, not elsewhere classified: Secondary | ICD-10-CM

## 2015-08-14 DIAGNOSIS — R2681 Unsteadiness on feet: Secondary | ICD-10-CM

## 2015-08-14 DIAGNOSIS — M79604 Pain in right leg: Secondary | ICD-10-CM

## 2015-08-14 DIAGNOSIS — R279 Unspecified lack of coordination: Secondary | ICD-10-CM | POA: Diagnosis not present
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
## 2015-08-16 ENCOUNTER — Encounter (HOSPITAL_COMMUNITY): Payer: Self-pay

## 2015-08-16 ENCOUNTER — Ambulatory Visit (HOSPITAL_COMMUNITY): Payer: Commercial Managed Care - PPO

## 2015-08-16 ENCOUNTER — Encounter (HOSPITAL_COMMUNITY): Payer: Self-pay | Admitting: Speech Pathology

## 2015-08-16 DIAGNOSIS — M6289 Other specified disorders of muscle: Secondary | ICD-10-CM

## 2015-08-16 DIAGNOSIS — R279 Unspecified lack of coordination: Secondary | ICD-10-CM | POA: Diagnosis not present

## 2015-08-16 DIAGNOSIS — R2681 Unsteadiness on feet: Secondary | ICD-10-CM

## 2015-08-16 DIAGNOSIS — R6889 Other general symptoms and signs: Secondary | ICD-10-CM

## 2015-08-16 DIAGNOSIS — S069X0S Unspecified intracranial injury without loss of consciousness, sequela: Secondary | ICD-10-CM

## 2015-08-16 DIAGNOSIS — R29898 Other symptoms and signs involving the musculoskeletal system: Secondary | ICD-10-CM

## 2015-08-16 DIAGNOSIS — M79605 Pain in left leg: Principal | ICD-10-CM

## 2015-08-16 DIAGNOSIS — M79604 Pain in right leg: Secondary | ICD-10-CM

## 2015-08-16 DIAGNOSIS — M25662 Stiffness of left knee, not elsewhere classified: Secondary | ICD-10-CM

## 2015-08-16 DIAGNOSIS — Z789 Other specified health status: Secondary | ICD-10-CM
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
## 2015-08-20 ENCOUNTER — Ambulatory Visit (HOSPITAL_COMMUNITY): Payer: Self-pay

## 2015-08-21 ENCOUNTER — Ambulatory Visit (HOSPITAL_COMMUNITY): Payer: Commercial Managed Care - PPO

## 2015-08-21 ENCOUNTER — Encounter (HOSPITAL_COMMUNITY): Payer: Self-pay | Admitting: Speech Pathology

## 2015-08-21 ENCOUNTER — Encounter (HOSPITAL_COMMUNITY): Payer: Self-pay | Admitting: Occupational Therapy

## 2015-08-21 ENCOUNTER — Other Ambulatory Visit: Payer: Self-pay | Admitting: Radiology

## 2015-08-21 NOTE — Patient Instructions (Signed)
NPO after [place] needs driver arrive at [id number]

## 2015-08-22 ENCOUNTER — Encounter (HOSPITAL_COMMUNITY): Payer: Self-pay

## 2015-08-22 ENCOUNTER — Ambulatory Visit (HOSPITAL_COMMUNITY)
Admission: RE | Admit: 2015-08-22 | Discharge: 2015-08-22 | Disposition: A | Discharge status: Home / Self Care | Payer: Commercial Managed Care - PPO | Source: Ambulatory Visit | Attending: Interventional Radiology | Admitting: Interventional Radiology

## 2015-08-22 DIAGNOSIS — Z87891 Personal history of nicotine dependence: Secondary | ICD-10-CM | POA: Insufficient documentation

## 2015-08-22 DIAGNOSIS — Z833 Family history of diabetes mellitus: Secondary | ICD-10-CM | POA: Insufficient documentation

## 2015-08-22 DIAGNOSIS — Z4689 Encounter for fitting and adjustment of other specified devices: Secondary | ICD-10-CM | POA: Insufficient documentation

## 2015-08-22 DIAGNOSIS — T148XXA Other injury of unspecified body region, initial encounter: Secondary | ICD-10-CM

## 2015-08-22 DIAGNOSIS — Z8249 Family history of ischemic heart disease and other diseases of the circulatory system: Secondary | ICD-10-CM | POA: Insufficient documentation

## 2015-08-22 MED ORDER — LIDOCAINE HCL 1 % IJ SOLN
INTRAMUSCULAR | Status: AC
  Filled 2015-08-22: qty 20

## 2015-08-22 MED ORDER — IOHEXOL 300 MG/ML  SOLN
150.0000 mL | Freq: Once | INTRAMUSCULAR | Status: AC | PRN
  Administered 2015-08-22: 60 mL via INTRAVENOUS

## 2015-08-22 NOTE — H&P (Signed)
Chief Complaint: s/p multiple orthopedic fractures with fat emboli requiring IVC filter placement, needs this removed  Referring Physician: Dr. Faith Rogue  Supervising Physician: Ruel Favors  HPI: Juan [name] is an 25 y.o. male known to [place] for IVC filter placement in 2016.  He was seen by Dr. Miles Costain in the clinic 08-08-15.  He was approved for IVC filter and presents today for this to be completed.  Past Medical History:  Past Medical History  Diagnosis Date  . Seizures (HCC)     x 1 at age 27  . Open fracture of right tibia 01/06/2015  . Open fracture of shaft of left tibia, type III 01/06/2015  . Closed left subtrochanteric femur fracture (HCC) 01/06/2015    Past Surgical History:  Past Surgical History  Procedure Laterality Date  . Femur im nail Bilateral 01/06/2015    Procedure: IRRIGATION AND DEBRIDEMENT BILATERAL LEGS WITH APPLICATION EXTERNAL FIXATOR RIGHT  TIBIA AND APPLICATION EXTERNAL FIXATORS TO LEFT FEMUR  AND LEFT TIBIA ;  Surgeon: Teryl Lucy, MD;  Location: [facility] OR;  Service: Orthopedics;  Laterality: Bilateral;  . Percutaneous tracheostomy N/A 01/18/2015    Procedure: PERCUTANEOUS TRACHEOSTOMY (BEDSIDE);  Surgeon: Violeta Gelinas, MD;  [facility] OR;  Service: General;  Laterality: N/A;  . Peg placement N/A 01/18/2015    Procedure: PERCUTANEOUS ENDOSCOPIC GASTROSTOMY (PEG) PLACEMENT;  Surgeon: Violeta Gelinas, MD;  Location: [facility] ENDOSCOPY;  Service: General;  Laterality: N/A;  bedside  . Esophagogastroduodenoscopy (egd) with propofol N/A 01/18/2015    Procedure: ESOPHAGOGASTRODUODENOSCOPY (EGD) WITH PROPOFOL;  Surgeon: Violeta Gelinas, MD;  Location: [facility] ENDOSCOPY;  Service: General;  Laterality: N/A;  . Orif pelvic fracture Bilateral 01/15/2015    Procedure: orif pelvis bilateral iliac screws percantaneous fixation left tavern, im nail bilateral tibia, retrograde im nail left femur, removal of external fixation ;  Surgeon: Myrene Galas, MD;  Location: [facility] OR;  Service:  Orthopedics;  Laterality: Bilateral;  . Tibia im nail insertion Bilateral 01/15/2015    Procedure: INTRAMEDULLARY (IM) NAIL TIBIAL;  Surgeon: Myrene Galas, MD;  [facility] OR;  Service: Orthopedics;  Laterality: Bilateral;  . Femur im nail Left 01/15/2015    Procedure: INTRAMEDULLARY (IM) RETROGRADE FEMORAL NAILING;  Surgeon: Myrene Galas, MD;  Location: [facility] OR;  Service: Orthopedics;  Laterality: Left;  . I&d extremity Bilateral 01/15/2015    Procedure: IRRIGATION AND DEBRIDEMENT BILATERAL EXTREMITY;  Surgeon: Myrene Galas, MD;  [facility] OR;  Service: Orthopedics;  Laterality: Bilateral;    Family History:  Family History  Problem Relation Age of Onset  . Diabetes Father   . Hypertension Mother   . Cancer Maternal Grandfather     Social History:  reports that he has quit smoking. His smoking use included Cigarettes. He has quit using smokeless tobacco. He reports that he does not drink alcohol or use illicit drugs.  Allergies: No Known Allergies  Medications:   Medication List    ASK your doctor about these medications        ibuprofen 800 MG tablet  Commonly known as:  ADVIL,MOTRIN  Take 800 mg by mouth every 8 (eight) hours as needed for mild pain.        Please HPI for pertinent positives, otherwise complete 10 system ROS negative.  Mallampati Score: MD Evaluation Airway: WNL Heart: WNL Abdomen: WNL Chest/ Lungs: WNL ASA  Classification: 3 Mallampati/Airway Score: One  Physical Exam: There were no vitals taken for this visit. There is no weight on file to calculate BMI.  General: pleasant, WD, WN white male who is laying in bed in NAD.  Ambulates with a cane. HEENT: head is normocephalic, atraumatic.  Sclera are noninjected.  PERRL.  Ears and nose without any masses or lesions.  Mouth is pink and moist Heart: regular, rate, and rhythm.  Normal s1,s2. No obvious gallops, or rubs noted. +murmur  Lungs: CTAB, no wheezes, rhonchi, or rales noted.  Respiratory  effort nonlabored Psych: A&Ox3 with an appropriate affect.   Labs: No results found for this or any previous visit (from the past 48 hour(s)).  Imaging: No results found.  Assessment/Plan 1. IVC filter retrieval -plan for IVC filter retrieval today.  The patient does not want sedation. -Risks and Benefits discussed with the patient including, but not limited to bleeding, infection, contrast induced renal failure. All of the patient's questions were answered, patient is agreeable to proceed. Consent signed and in chart.    Thank you for this interesting consult.  I greatly enjoyed meeting Juan [name] and look forward to participating in their care.  A copy of this report was sent to the requesting provider on this date.  Electronically Signed: Letha CapeSBORNE,Trestin Vences E 08/22/2015, 11:02 AM   I spent a total of    15 Minutes in face to face in clinical consultation, greater than 50% of which was counseling/coordinating care for IVC filter retrieval

## 2015-08-22 NOTE — Sedation Documentation (Signed)
No d/c instructions available for IVC retrieval.. Verbal instructions given about signs and symptoms of infection and instructions for bleeding post procedure and dressing change going forward.  Pt and family verbalize understanding.  Ready for d/c.  No sedation, meds or IVs today.  Tolerated procedure well, taking po's without difficulty.  Requests ambulation for d/c.

## 2015-08-22 NOTE — Procedures (Signed)
Successful IVC FILTER RETRIEVAL No comp Stable Full report in PACS 

## 2015-08-22 NOTE — Discharge Instructions (Signed)
Inferior Vena Cava Filter Insertion Insertion of an inferior vena cava (IVC) filter is a procedure in which a filter is placed into the large vein in your abdomen that carries blood from the lower part of your body to your heart (inferior vena cava). Placement of the filter here helps prevent blood clots in the legs or pelvis from traveling to your lungs. A large blood clot in the lungs can cause death.  The filter is a small, metal device about an inch long. It is shaped like the spokes of an umbrella and is inserted through a pathway created in your neck or groin. The risks of this procedure are usually small and easily managed. Inferior vena cava filters are only used when blood thinners (anticoagulants) cannot be used to prevent blood clots from forming. This may occur because of:  You have severe platelet problems or shortage.  You have had recent or current major bleeding that cannot be treated.  You have bleeding associated with anticoagulants.  You have recurrence of blood clots while on anticoagulants.  You have a need for surgery in the near future.  You have bleeding in your head. EXPECTATIONS OF A FILTER  An IVC filter will reduce the risk of a large blood clot making its way to your lungs (pulmonary embolus, or PE). It cannot eliminate the risk completely, or prevent small PEs from occurring.  It does not stop blood clots from growing, recurring, or developing into postphlebitic syndrome. This is a condition that can occur after there is inflammation in a vein (phlebitis). LET YOUR HEALTH CARE PROVIDER KNOW ABOUT:  Any allergies you have. This includes an allergy to iodine or contrast dye.  All medicines you are taking, including vitamins, herbs, eye drops, creams and over-the-counter medicines.  Previous problems you or members of your family have had with the use of anesthetics.  Any blood disorders you have.  Previous surgeries you had had.  Medical conditions you  have.  Possibility of pregnancy, if this applies. RISKS AND COMPLICATIONS Generally, this is a safe procedure. However, as with any procedure, problems can occur. Possible problems include:  A small bruise around the needle insertion site. A larger pooling of blood called a hematoma may form. This is usually of no concern.  The filter can block the vena cava. This can cause some swelling of the legs.  The filter may eventually fail and not work properly.  Continued bleeding or infections (uncommon).  Damage to the vein by the catheter (rare). BEFORE THE PROCEDURE  Your health care provider may want you to have blood tests. These tests can help tell how well your kidneys and liver are working. They can also show how well your blood clots.  If you take blood thinners, ask your health care provider if and when you should stop taking them.  Do not eat or drink for 4 hours before the procedure or as directed by your health care provider.  Make arrangements for someone to drive you home. Depending on the procedure, you may be able to go home the same day.  PROCEDURE   Insertion of a Vena Cava filter is performed by a vascular surgeon or cardiologist in a cardiac catheterization lab.  The procedure usually takes about 30 minutes to 1 hour. This can vary.  An IV needle will be inserted into one of your veins. Medicine will be able to flow directly into your body through this needle.  Medicines may given to help you relax   and relieve anxiety (sedative).  The procedure is done through a large vein either in your neck or groin. The skin around this area is cleaned and shaved, as necessary.  The skin and deeper tissues over the vein will be made numb with a local anesthetic. You are awake during the procedure and can let your health care providers know if you have discomfort.  A needle is then put into the vein. A guide wire is placed through the needle and into the vein. This is used to  help insert a catheter and the IVC filter into your vein.  Contrast dye may be injected into the inferior vena cava to help guide the catheter and verify precise placement of the IVC filter. X-ray equipment may also be used to verify that the catheter and the wire are in the correct position.  The wire is then withdrawn.  The IVC filter is then passed over the catheter into the vein, and inserted into the correct location in the vena cava.  The catheter is then removed. Pressure will be kept on the needle insertion point for several minutes or until it is unlikely to bleed. AFTER THE PROCEDURE  You will stay in a recovery area until any sedation medicine you were given has worn off. Your blood pressure and pulse will be checked.  If there are no problems, you should be able to go home after the procedure.  You may feel sore at the area of the needle insertion for a few days.   This information is not intended to replace advice given to you by your health care provider. Make sure you discuss any questions you have with your health care provider.   Document Released: 06/25/2005 Document Revised: 05/26/2014 Document Reviewed: 01/10/2013 Elsevier Interactive Patient Education 2016 [employer]  

## 2015-08-22 NOTE — Sedation Documentation (Signed)
Received to holding area post IVC removal.  PT refused sedation and IV.  Will hold 2 hours post procedure, pt verbalizes understanding.

## 2015-08-23 ENCOUNTER — Ambulatory Visit (HOSPITAL_COMMUNITY): Payer: Commercial Managed Care - PPO | Attending: Family Medicine | Admitting: Speech Pathology

## 2015-08-23 ENCOUNTER — Ambulatory Visit (HOSPITAL_COMMUNITY): Payer: Commercial Managed Care - PPO

## 2015-08-23 ENCOUNTER — Encounter (HOSPITAL_COMMUNITY): Payer: Self-pay | Admitting: Speech Pathology

## 2015-08-23 ENCOUNTER — Encounter (HOSPITAL_COMMUNITY): Payer: Self-pay

## 2015-08-23 DIAGNOSIS — M25662 Stiffness of left knee, not elsewhere classified: Secondary | ICD-10-CM | POA: Insufficient documentation

## 2015-08-23 DIAGNOSIS — R2681 Unsteadiness on feet: Secondary | ICD-10-CM

## 2015-08-23 DIAGNOSIS — M79661 Pain in right lower leg: Secondary | ICD-10-CM | POA: Diagnosis present

## 2015-08-23 DIAGNOSIS — M6281 Muscle weakness (generalized): Secondary | ICD-10-CM | POA: Insufficient documentation

## 2015-08-23 DIAGNOSIS — M79662 Pain in left lower leg: Secondary | ICD-10-CM

## 2015-08-23 DIAGNOSIS — R41841 Cognitive communication deficit: Secondary | ICD-10-CM | POA: Insufficient documentation
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
## 2015-08-27 ENCOUNTER — Ambulatory Visit (HOSPITAL_COMMUNITY): Payer: Self-pay

## 2015-08-28 ENCOUNTER — Encounter (HOSPITAL_COMMUNITY): Payer: Self-pay | Admitting: Speech Pathology

## 2015-08-28 ENCOUNTER — Ambulatory Visit (HOSPITAL_COMMUNITY): Payer: Commercial Managed Care - PPO | Admitting: Speech Pathology

## 2015-08-28 ENCOUNTER — Ambulatory Visit (HOSPITAL_COMMUNITY): Payer: Commercial Managed Care - PPO | Admitting: Physical Therapy

## 2015-08-28 DIAGNOSIS — M79662 Pain in left lower leg: Secondary | ICD-10-CM

## 2015-08-28 DIAGNOSIS — R41841 Cognitive communication deficit: Secondary | ICD-10-CM | POA: Diagnosis not present

## 2015-08-28 DIAGNOSIS — M6281 Muscle weakness (generalized): Secondary | ICD-10-CM

## 2015-08-28 DIAGNOSIS — M79661 Pain in right lower leg: Secondary | ICD-10-CM

## 2015-08-28 DIAGNOSIS — R2681 Unsteadiness on feet: Secondary | ICD-10-CM

## 2015-08-28 DIAGNOSIS — M25662 Stiffness of left knee, not elsewhere classified: Secondary | ICD-10-CM
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
## 2015-08-30 ENCOUNTER — Ambulatory Visit (HOSPITAL_COMMUNITY): Payer: Self-pay

## 2015-08-30 ENCOUNTER — Encounter (HOSPITAL_COMMUNITY): Payer: Self-pay | Admitting: Occupational Therapy

## 2015-09-03 ENCOUNTER — Ambulatory Visit (HOSPITAL_COMMUNITY): Payer: Self-pay | Admitting: Physical Therapy

## 2015-09-04 ENCOUNTER — Encounter (HOSPITAL_COMMUNITY): Payer: Self-pay

## 2015-09-04 ENCOUNTER — Ambulatory Visit (HOSPITAL_COMMUNITY): Payer: Commercial Managed Care - PPO | Admitting: Speech Pathology

## 2015-09-04 ENCOUNTER — Ambulatory Visit (HOSPITAL_COMMUNITY): Payer: Commercial Managed Care - PPO

## 2015-09-04 ENCOUNTER — Telehealth (HOSPITAL_COMMUNITY): Payer: Self-pay

## 2015-09-04 NOTE — Telephone Encounter (Signed)
No show, called and spoke to pt and girlfriend who stated they did not make it to apt today due to waiting on call from our front office on approval for additional visits per insurance.  Front office adviced to inform pt on response from insurance.    [address]Kristee Angus, LPTA; CBIS [phone]

## 2015-09-05 ENCOUNTER — Encounter
Payer: Commercial Managed Care - PPO | Attending: Physical Medicine & Rehabilitation | Admitting: Physical Medicine & Rehabilitation

## 2015-09-05 ENCOUNTER — Encounter: Payer: Self-pay | Admitting: Physical Medicine & Rehabilitation

## 2015-09-05 VITALS — BP 138/71 | HR 106

## 2015-09-05 DIAGNOSIS — S7222XS Displaced subtrochanteric fracture of left femur, sequela: Secondary | ICD-10-CM

## 2015-09-05 DIAGNOSIS — S7292XA Unspecified fracture of left femur, initial encounter for closed fracture: Secondary | ICD-10-CM | POA: Insufficient documentation

## 2015-09-05 DIAGNOSIS — G939 Disorder of brain, unspecified: Secondary | ICD-10-CM | POA: Insufficient documentation

## 2015-09-05 DIAGNOSIS — Z959 Presence of cardiac and vascular implant and graft, unspecified: Secondary | ICD-10-CM | POA: Insufficient documentation

## 2015-09-05 DIAGNOSIS — S069X4S Unspecified intracranial injury with loss of consciousness of 6 hours to 24 hours, sequela: Secondary | ICD-10-CM | POA: Diagnosis not present

## 2015-09-05 DIAGNOSIS — S3282XS Multiple fractures of pelvis without disruption of pelvic ring, sequela: Secondary | ICD-10-CM | POA: Diagnosis not present

## 2015-09-05 DIAGNOSIS — T791XXA Fat embolism (traumatic), initial encounter: Secondary | ICD-10-CM | POA: Insufficient documentation

## 2015-09-05 DIAGNOSIS — S32029A Unspecified fracture of second lumbar vertebra, initial encounter for closed fracture: Secondary | ICD-10-CM | POA: Diagnosis not present

## 2015-09-05 DIAGNOSIS — S2243XB Multiple fractures of ribs, bilateral, initial encounter for open fracture: Secondary | ICD-10-CM | POA: Diagnosis not present

## 2015-09-05 DIAGNOSIS — S32019A Unspecified fracture of first lumbar vertebra, initial encounter for closed fracture: Secondary | ICD-10-CM | POA: Diagnosis not present
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
## 2015-09-06 ENCOUNTER — Ambulatory Visit (HOSPITAL_COMMUNITY): Payer: Commercial Managed Care - PPO | Admitting: Speech Pathology

## 2015-09-06 ENCOUNTER — Encounter (HOSPITAL_COMMUNITY): Payer: Self-pay | Admitting: Occupational Therapy

## 2015-09-06 ENCOUNTER — Ambulatory Visit (HOSPITAL_COMMUNITY): Payer: Commercial Managed Care - PPO

## 2015-09-10 ENCOUNTER — Ambulatory Visit (HOSPITAL_COMMUNITY): Payer: Self-pay | Admitting: Physical Therapy

## 2015-09-11 ENCOUNTER — Ambulatory Visit (HOSPITAL_COMMUNITY): Payer: Commercial Managed Care - PPO | Admitting: Physical Therapy

## 2015-09-11 ENCOUNTER — Telehealth (HOSPITAL_COMMUNITY): Payer: Self-pay

## 2015-09-11 ENCOUNTER — Ambulatory Visit (HOSPITAL_COMMUNITY): Payer: Commercial Managed Care - PPO | Admitting: Speech Pathology

## 2015-09-11 ENCOUNTER — Encounter (HOSPITAL_COMMUNITY): Payer: Self-pay | Admitting: Occupational Therapy

## 2015-09-11 NOTE — Telephone Encounter (Signed)
09/11/15 called to cancel appts because we still didn't have the approval from the insurance company.

## 2015-09-13 ENCOUNTER — Ambulatory Visit (HOSPITAL_COMMUNITY): Payer: Commercial Managed Care - PPO | Admitting: Speech Pathology

## 2015-09-13 ENCOUNTER — Ambulatory Visit (HOSPITAL_COMMUNITY): Payer: Self-pay | Admitting: Physical Therapy

## 2015-09-13 ENCOUNTER — Telehealth (HOSPITAL_COMMUNITY): Payer: Self-pay

## 2015-09-13 ENCOUNTER — Encounter (HOSPITAL_COMMUNITY): Payer: Self-pay | Admitting: Occupational Therapy

## 2015-09-18 ENCOUNTER — Telehealth (HOSPITAL_COMMUNITY): Payer: Self-pay

## 2015-09-18 ENCOUNTER — Encounter (HOSPITAL_COMMUNITY): Payer: Self-pay | Admitting: Speech Pathology

## 2015-09-18 NOTE — Telephone Encounter (Signed)
09/18/15 girlfriend called about coming .... Still no approval for visits and spoke to insurance co last week and still working on  Getting visits approved

## 2015-09-20 ENCOUNTER — Encounter (HOSPITAL_COMMUNITY): Payer: Self-pay | Admitting: Speech Pathology

## 2015-09-20 ENCOUNTER — Telehealth (HOSPITAL_COMMUNITY): Payer: Self-pay

## 2015-09-20 NOTE — Telephone Encounter (Signed)
09/20/15  Left message to let Juan [name] and his girlfriend know where things stood with his insurance company.  I spoke to QatarStephanie at [employer] and she clarified dates that he started, how many PT/SP visits he has already had.  She has all of the documentation that was faxed earlier in April and now has to pass along to a physician there to overlook and approve the visits that we are requesting.  Judeth CornfieldStephanie said that she would give us a call back when she had notification ot the visits.  Will stay on top of this so we can get him back on the schedule very soon.

## 2015-09-25 ENCOUNTER — Encounter (HOSPITAL_COMMUNITY): Payer: Self-pay | Admitting: Speech Pathology

## 2015-09-27 ENCOUNTER — Encounter (HOSPITAL_COMMUNITY): Payer: Self-pay | Admitting: Speech Pathology

## 2015-10-01 ENCOUNTER — Telehealth (HOSPITAL_COMMUNITY): Payer: Self-pay

## 2015-10-01 NOTE — Telephone Encounter (Signed)
09/28/15 insurance company called and said that for SPEECH the last 13 visits are approved for 2x week and also another 4 visits approved and then discharge to HEP.  The Authorization date starts 5/15 thru 6/2; Auth. # [id number].  PT sent out too but don't have that information back yet.

## 2015-10-02 ENCOUNTER — Telehealth (HOSPITAL_COMMUNITY): Payer: Self-pay

## 2015-10-02 ENCOUNTER — Encounter (HOSPITAL_COMMUNITY): Payer: Self-pay | Admitting: Speech Pathology

## 2015-10-02 NOTE — Telephone Encounter (Signed)
10/02/15 Received call from insurance cmpany -- 6 more PT visits have been approved... 2x for 2 weeks then 1 x week for 2 weeks and then transition to HEP.  5/22 thru 11/09/15 Auth. # [id number]

## 2015-10-04 ENCOUNTER — Encounter (HOSPITAL_COMMUNITY): Payer: Self-pay | Admitting: Speech Pathology

## 2015-10-09 ENCOUNTER — Encounter (HOSPITAL_COMMUNITY): Payer: Self-pay | Admitting: Speech Pathology

## 2015-10-09 ENCOUNTER — Ambulatory Visit (HOSPITAL_COMMUNITY): Payer: Commercial Managed Care - PPO | Attending: Family Medicine | Admitting: Physical Therapy

## 2015-10-09 ENCOUNTER — Telehealth (HOSPITAL_COMMUNITY): Payer: Self-pay

## 2015-10-09 DIAGNOSIS — R41841 Cognitive communication deficit: Secondary | ICD-10-CM | POA: Diagnosis present

## 2015-10-09 DIAGNOSIS — M79662 Pain in left lower leg: Secondary | ICD-10-CM | POA: Diagnosis present

## 2015-10-09 DIAGNOSIS — M6281 Muscle weakness (generalized): Secondary | ICD-10-CM

## 2015-10-09 DIAGNOSIS — M25662 Stiffness of left knee, not elsewhere classified: Secondary | ICD-10-CM | POA: Diagnosis present

## 2015-10-09 DIAGNOSIS — R2681 Unsteadiness on feet: Secondary | ICD-10-CM

## 2015-10-09 DIAGNOSIS — M79661 Pain in right lower leg: Secondary | ICD-10-CM | POA: Diagnosis present

## 2015-10-09 NOTE — Telephone Encounter (Signed)
10/09/15 pt cx SP and said he would be at the PT appt
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
## 2015-10-11 ENCOUNTER — Ambulatory Visit (HOSPITAL_COMMUNITY): Payer: Commercial Managed Care - PPO | Admitting: Speech Pathology

## 2015-10-11 ENCOUNTER — Encounter (HOSPITAL_COMMUNITY): Payer: Self-pay | Admitting: Speech Pathology

## 2015-10-11 DIAGNOSIS — R41841 Cognitive communication deficit: Secondary | ICD-10-CM

## 2015-10-11 DIAGNOSIS — M6281 Muscle weakness (generalized): Secondary | ICD-10-CM | POA: Diagnosis not present
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
## 2015-10-16 ENCOUNTER — Encounter (HOSPITAL_COMMUNITY): Payer: Self-pay | Admitting: Speech Pathology

## 2015-10-16 ENCOUNTER — Ambulatory Visit (HOSPITAL_COMMUNITY): Payer: Commercial Managed Care - PPO | Admitting: Speech Pathology

## 2015-10-16 DIAGNOSIS — M6281 Muscle weakness (generalized): Secondary | ICD-10-CM | POA: Diagnosis not present

## 2015-10-16 DIAGNOSIS — R41841 Cognitive communication deficit: Secondary | ICD-10-CM
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
## 2015-10-18 ENCOUNTER — Ambulatory Visit (HOSPITAL_COMMUNITY): Payer: Commercial Managed Care - PPO | Attending: Family Medicine | Admitting: Speech Pathology

## 2015-10-18 ENCOUNTER — Encounter: Payer: Self-pay | Admitting: Family Medicine

## 2015-10-18 DIAGNOSIS — R41841 Cognitive communication deficit: Secondary | ICD-10-CM | POA: Insufficient documentation

## 2015-10-18 DIAGNOSIS — R2681 Unsteadiness on feet: Secondary | ICD-10-CM | POA: Diagnosis present

## 2015-10-18 DIAGNOSIS — M6281 Muscle weakness (generalized): Secondary | ICD-10-CM | POA: Insufficient documentation

## 2015-10-18 DIAGNOSIS — M79662 Pain in left lower leg: Secondary | ICD-10-CM | POA: Diagnosis present
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
## 2015-10-22 ENCOUNTER — Telehealth (HOSPITAL_COMMUNITY): Payer: Self-pay | Admitting: Physical Therapy

## 2015-10-22 ENCOUNTER — Ambulatory Visit (HOSPITAL_COMMUNITY): Payer: Commercial Managed Care - PPO | Admitting: Physical Therapy

## 2015-10-22 NOTE — Telephone Encounter (Signed)
Pt missed appointment.  Therapist called patient and let him know that his next appointment is on 6/12/ 2017.   Virgina Organynthia Russell, PT CLT [phone]

## 2015-10-24 ENCOUNTER — Ambulatory Visit (HOSPITAL_COMMUNITY): Payer: Self-pay | Admitting: Physical Therapy

## 2015-10-29 ENCOUNTER — Ambulatory Visit (HOSPITAL_COMMUNITY): Payer: Commercial Managed Care - PPO | Admitting: Physical Therapy

## 2015-10-29 DIAGNOSIS — M79662 Pain in left lower leg: Secondary | ICD-10-CM

## 2015-10-29 DIAGNOSIS — R41841 Cognitive communication deficit: Secondary | ICD-10-CM | POA: Diagnosis not present

## 2015-10-29 DIAGNOSIS — M6281 Muscle weakness (generalized): Secondary | ICD-10-CM

## 2015-10-29 DIAGNOSIS — R2681 Unsteadiness on feet: Secondary | ICD-10-CM
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
## 2015-10-31 ENCOUNTER — Ambulatory Visit (HOSPITAL_COMMUNITY): Payer: Commercial Managed Care - PPO

## 2015-10-31 DIAGNOSIS — M79662 Pain in left lower leg: Secondary | ICD-10-CM

## 2015-10-31 DIAGNOSIS — M6281 Muscle weakness (generalized): Secondary | ICD-10-CM

## 2015-10-31 DIAGNOSIS — R41841 Cognitive communication deficit: Secondary | ICD-10-CM | POA: Diagnosis not present

## 2015-10-31 DIAGNOSIS — R2681 Unsteadiness on feet: Secondary | ICD-10-CM

## 2015-10-31 NOTE — Patient Instructions (Signed)
Arm / Leg Extension: Alternate (All-Fours)    Raise right arm and opposite leg. Do not arch neck. Repeat 10-20 times per set. Do 2 sets per session.   http://orth.exer.us/110   Copyright  VHI. All rights reserved.   Band Walk: Side Stepping    Tie band around legs, just above knees. Step down hallway with theraband around legs, then step back to start. Note: Small towel between band and skin eases rubbing.  http://plyo.exer.us/76   Copyright  VHI. All rights reserved.   Tandem Stance    Right foot in front of left, heel touching toe both feet "straight ahead". Stand on Foot Triangle of Support with both feet. Balance in this position 30 seconds. Do with left foot in front of right.  Copyright  VHI. All rights reserved.   SINGLE LIMB STANCE    Stance: single leg on floor. Raise leg. Hold up to 60"seconds. Repeat with other leg. Complete 5 times daily Copyright  VHI. All rights reserved.
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
## 2015-11-05 ENCOUNTER — Ambulatory Visit (HOSPITAL_COMMUNITY): Payer: Commercial Managed Care - PPO | Admitting: Physical Therapy

## 2015-11-05 DIAGNOSIS — R2681 Unsteadiness on feet: Secondary | ICD-10-CM

## 2015-11-05 DIAGNOSIS — M6281 Muscle weakness (generalized): Secondary | ICD-10-CM

## 2015-11-05 DIAGNOSIS — R41841 Cognitive communication deficit: Secondary | ICD-10-CM | POA: Diagnosis not present

## 2015-11-05 DIAGNOSIS — M79662 Pain in left lower leg: Secondary | ICD-10-CM
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
## 2015-12-05 ENCOUNTER — Encounter: Payer: Self-pay | Admitting: Physical Medicine & Rehabilitation

## 2015-12-05 ENCOUNTER — Encounter
Payer: Commercial Managed Care - PPO | Attending: Physical Medicine & Rehabilitation | Admitting: Physical Medicine & Rehabilitation

## 2015-12-05 VITALS — BP 122/79 | HR 95

## 2015-12-05 DIAGNOSIS — S72002D Fracture of unspecified part of neck of left femur, subsequent encounter for closed fracture with routine healing: Secondary | ICD-10-CM | POA: Diagnosis not present

## 2015-12-05 DIAGNOSIS — S32402D Unspecified fracture of left acetabulum, subsequent encounter for fracture with routine healing: Secondary | ICD-10-CM | POA: Insufficient documentation

## 2015-12-05 DIAGNOSIS — T791XXA Fat embolism (traumatic), initial encounter: Secondary | ICD-10-CM | POA: Diagnosis not present

## 2015-12-05 DIAGNOSIS — E291 Testicular hypofunction: Secondary | ICD-10-CM | POA: Diagnosis not present

## 2015-12-05 DIAGNOSIS — R52 Pain, unspecified: Secondary | ICD-10-CM | POA: Diagnosis present

## 2015-12-05 DIAGNOSIS — S32009S Unspecified fracture of unspecified lumbar vertebra, sequela: Secondary | ICD-10-CM

## 2015-12-05 DIAGNOSIS — S7222XS Displaced subtrochanteric fracture of left femur, sequela: Secondary | ICD-10-CM | POA: Diagnosis not present

## 2015-12-05 DIAGNOSIS — S32019D Unspecified fracture of first lumbar vertebra, subsequent encounter for fracture with routine healing: Secondary | ICD-10-CM | POA: Diagnosis not present

## 2015-12-05 DIAGNOSIS — S069X4S Unspecified intracranial injury with loss of consciousness of 6 hours to 24 hours, sequela: Secondary | ICD-10-CM

## 2015-12-05 DIAGNOSIS — S32029D Unspecified fracture of second lumbar vertebra, subsequent encounter for fracture with routine healing: Secondary | ICD-10-CM | POA: Diagnosis not present

## 2015-12-05 DIAGNOSIS — G939 Disorder of brain, unspecified: Secondary | ICD-10-CM | POA: Diagnosis present

## 2015-12-05 DIAGNOSIS — Z87891 Personal history of nicotine dependence: Secondary | ICD-10-CM | POA: Insufficient documentation

## 2015-12-05 DIAGNOSIS — S32401D Unspecified fracture of right acetabulum, subsequent encounter for fracture with routine healing: Secondary | ICD-10-CM | POA: Diagnosis not present

## 2015-12-05 DIAGNOSIS — S32008S Other fracture of unspecified lumbar vertebra, sequela: Secondary | ICD-10-CM | POA: Diagnosis not present

## 2015-12-05 MED ORDER — IBUPROFEN 800 MG PO TABS: 800.0000 mg | ORAL_TABLET | Freq: Three times a day (TID) | ORAL | Status: DC | PRN
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
## 2015-12-05 NOTE — Patient Instructions (Signed)
RETURN TO DRIVING PLAN:  WITH THE SUPERVISION OF A LICENSED DRIVER, PLEASE DRIVE IN AN EMPTY PARKING LOT FOR AT LEAST 2-3 TRIALS TO TEST REACTION TIME, VISION, USE OF EQUIPMENT IN CAR, ETC.  IF SUCCESSFUL WITH THE PARKING LOT DRIVING, PROCEED TO SUPERVISED DRIVING TRIALS IN YOUR NEIGHBORHOOD STREETS AT LOW TRAFFIC TIMES TO TEST OBSERVATION TO TRAFFIC SIGNALS, REACTION TIME, ETC. PLEASE ATTEMPT AT LEAST 2-3 TRIALS IN YOUR NEIGHBORHOOD.  IF NEIGHBORHOOD DRIVING IS SUCCESSFUL, YOU MAY PROCEED TO DRIVING IN BUSIER AREAS IN YOUR COMMUNITY WITH SUPERVISION OF A LICENSED DRIVER. PLEASE ATTEMPT AT LEAST 4-5 TRIALS.  IF COMMUNITY DRIVING IS SUCCESSFUL, YOU MAY PROCEED TO DRIVING ALONE, DURING THE DAY TIME, IN NON-PEAK TRAFFIC TIMES. YOU SHOULD DRIVE NO FURTHER THAN 20 MINUTES IN ONE DIRECTION. PLEASE DO NOT DRIVE IF YOU FEEL FATIGUED OR UNDER THE INFLUENCE OF MEDICATION.    Consider aquatic walking/pool walking to help improve balance and form

## 2016-02-11 ENCOUNTER — Telehealth (HOSPITAL_COMMUNITY): Payer: Self-pay | Admitting: Physical Therapy

## 2016-02-11 ENCOUNTER — Encounter (HOSPITAL_COMMUNITY): Payer: Self-pay | Admitting: Physical Therapy

## 2016-02-11 NOTE — Telephone Encounter (Signed)
Talked to pt's girlfriend and she requsted that  his number be the primary number from now on, she will not be handling this for him anymore. Nf 02/11/16

## 2016-04-07 ENCOUNTER — Encounter
Payer: Commercial Managed Care - PPO | Attending: Physical Medicine & Rehabilitation | Admitting: Physical Medicine & Rehabilitation

## 2016-04-07 ENCOUNTER — Encounter: Payer: Self-pay | Admitting: Physical Medicine & Rehabilitation

## 2016-04-07 VITALS — BP 127/80 | HR 81 | Resp 14

## 2016-04-07 DIAGNOSIS — M25562 Pain in left knee: Secondary | ICD-10-CM | POA: Insufficient documentation

## 2016-04-07 DIAGNOSIS — M170 Bilateral primary osteoarthritis of knee: Secondary | ICD-10-CM | POA: Insufficient documentation

## 2016-04-07 DIAGNOSIS — Z809 Family history of malignant neoplasm, unspecified: Secondary | ICD-10-CM | POA: Diagnosis not present

## 2016-04-07 DIAGNOSIS — Z833 Family history of diabetes mellitus: Secondary | ICD-10-CM | POA: Insufficient documentation

## 2016-04-07 DIAGNOSIS — Z8249 Family history of ischemic heart disease and other diseases of the circulatory system: Secondary | ICD-10-CM | POA: Insufficient documentation

## 2016-04-07 DIAGNOSIS — Z9889 Other specified postprocedural states: Secondary | ICD-10-CM | POA: Insufficient documentation

## 2016-04-07 DIAGNOSIS — T791XXS Fat embolism (traumatic), sequela: Secondary | ICD-10-CM

## 2016-04-07 DIAGNOSIS — M25561 Pain in right knee: Secondary | ICD-10-CM | POA: Diagnosis not present

## 2016-04-07 DIAGNOSIS — X58XXXA Exposure to other specified factors, initial encounter: Secondary | ICD-10-CM | POA: Insufficient documentation

## 2016-04-07 DIAGNOSIS — T791XXA Fat embolism (traumatic), initial encounter: Secondary | ICD-10-CM | POA: Diagnosis present

## 2016-04-07 DIAGNOSIS — M172 Bilateral post-traumatic osteoarthritis of knee: Secondary | ICD-10-CM | POA: Insufficient documentation

## 2016-04-07 DIAGNOSIS — Z87891 Personal history of nicotine dependence: Secondary | ICD-10-CM | POA: Diagnosis not present

## 2016-04-07 DIAGNOSIS — Z8781 Personal history of (healed) traumatic fracture: Secondary | ICD-10-CM | POA: Insufficient documentation

## 2016-04-07 DIAGNOSIS — E291 Testicular hypofunction: Secondary | ICD-10-CM | POA: Diagnosis not present

## 2016-04-07 DIAGNOSIS — S3282XS Multiple fractures of pelvis without disruption of pelvic ring, sequela: Secondary | ICD-10-CM | POA: Diagnosis not present

## 2016-04-07 MED ORDER — MELOXICAM 7.5 MG PO TABS: 7.5000 mg | ORAL_TABLET | Freq: Every day | ORAL | 4 refills | Status: DC
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
## 2016-04-07 NOTE — Patient Instructions (Signed)
JOINT SUPPLEMENTS:  GLUCOSAMINE WITH CHONDROITIN CELERY SEED TURMERIC  TART CHERRY EXTRACT GINGER OMEGA 3 FATTY ACIDS   PLEASE CALL ME WITH ANY PROBLEMS OR QUESTIONS [phone])   HAPPY THANKSGIVING!!!!

## 2016-04-15 ENCOUNTER — Telehealth: Payer: Self-pay | Admitting: Physical Medicine & Rehabilitation

## 2016-04-15 NOTE — Telephone Encounter (Signed)
[name] (patient's girlfriend) normally comes to patient's visits, but couldn't on last visit.  She has questions about new medication that he was put on.  If someone could call her at [phone].

## 2016-04-15 NOTE — Telephone Encounter (Signed)
Consulted pt's DPR, contacted, [name] (pt's girlfriend).  She had questions about meloxicam. Concerned if it is once a day, or once a day prn.  I instructed that pt is to take once a day as needed for pain.  No more than one tablet a day.  Pt's girlfriend understood

## 2016-05-28 ENCOUNTER — Ambulatory Visit: Payer: Self-pay | Admitting: Neurology

## 2016-05-28 ENCOUNTER — Encounter: Payer: Self-pay | Admitting: Neurology

## 2016-05-28 ENCOUNTER — Ambulatory Visit (INDEPENDENT_AMBULATORY_CARE_PROVIDER_SITE_OTHER): Payer: Medicaid Other | Admitting: Neurology

## 2016-05-28 VITALS — BP 124/76 | HR 80 | Ht 67.0 in | Wt 169.3 lb

## 2016-05-28 DIAGNOSIS — Z87820 Personal history of traumatic brain injury: Secondary | ICD-10-CM | POA: Diagnosis not present

## 2016-05-28 NOTE — Patient Instructions (Signed)
1. Call our office once you are ready to go back to work and we will get you scheduled for the Neuropsychological testing 2. Follow-up in 1 year, call for any changes
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
## 2016-06-08 ENCOUNTER — Encounter: Payer: Self-pay | Admitting: Neurology

## 2016-07-08 ENCOUNTER — Encounter: Payer: Self-pay | Admitting: Physical Medicine & Rehabilitation

## 2016-07-08 ENCOUNTER — Encounter: Payer: Medicaid Other | Attending: Physical Medicine & Rehabilitation | Admitting: Physical Medicine & Rehabilitation

## 2016-07-08 VITALS — BP 123/82 | HR 84

## 2016-07-08 DIAGNOSIS — T791XXA Fat embolism (traumatic), initial encounter: Secondary | ICD-10-CM | POA: Diagnosis not present

## 2016-07-08 DIAGNOSIS — I69319 Unspecified symptoms and signs involving cognitive functions following cerebral infarction: Secondary | ICD-10-CM

## 2016-07-08 DIAGNOSIS — M170 Bilateral primary osteoarthritis of knee: Secondary | ICD-10-CM | POA: Diagnosis not present

## 2016-07-08 DIAGNOSIS — Z8249 Family history of ischemic heart disease and other diseases of the circulatory system: Secondary | ICD-10-CM | POA: Insufficient documentation

## 2016-07-08 DIAGNOSIS — Z833 Family history of diabetes mellitus: Secondary | ICD-10-CM | POA: Insufficient documentation

## 2016-07-08 DIAGNOSIS — Z87891 Personal history of nicotine dependence: Secondary | ICD-10-CM | POA: Diagnosis not present

## 2016-07-08 DIAGNOSIS — Z8781 Personal history of (healed) traumatic fracture: Secondary | ICD-10-CM | POA: Diagnosis not present

## 2016-07-08 DIAGNOSIS — Z9889 Other specified postprocedural states: Secondary | ICD-10-CM | POA: Diagnosis not present

## 2016-07-08 DIAGNOSIS — M25561 Pain in right knee: Secondary | ICD-10-CM | POA: Insufficient documentation

## 2016-07-08 DIAGNOSIS — M25562 Pain in left knee: Secondary | ICD-10-CM | POA: Insufficient documentation

## 2016-07-08 DIAGNOSIS — Z809 Family history of malignant neoplasm, unspecified: Secondary | ICD-10-CM | POA: Insufficient documentation

## 2016-07-08 DIAGNOSIS — T791XXS Fat embolism (traumatic), sequela: Secondary | ICD-10-CM | POA: Diagnosis not present

## 2016-07-08 DIAGNOSIS — E291 Testicular hypofunction: Secondary | ICD-10-CM | POA: Diagnosis not present

## 2016-07-08 NOTE — Patient Instructions (Signed)
CONTINUE TO WORK ON A ROUTINE AT HOME KEEP A CALENDAR OR ORGANIZER DO THINGS ONE AT A TIME, AVOID MULTI-TASKING WHEN YOU NEED TO FOCUS, FIND A QUIET ENVIRONMENT   PLEASE FEEL FREE TO CALL OUR OFFICE WITH ANY PROBLEMS OR QUESTIONS [phone]
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
## 2016-07-23 IMAGING — CR DG CHEST 1V PORT
1 series · 1 of 1 positions shown · non-contrast
Comparison: 02/14/2015

CLINICAL DATA: Evaluate for pneumonia

EXAM:
PORTABLE CHEST 1 VIEW

[AP]
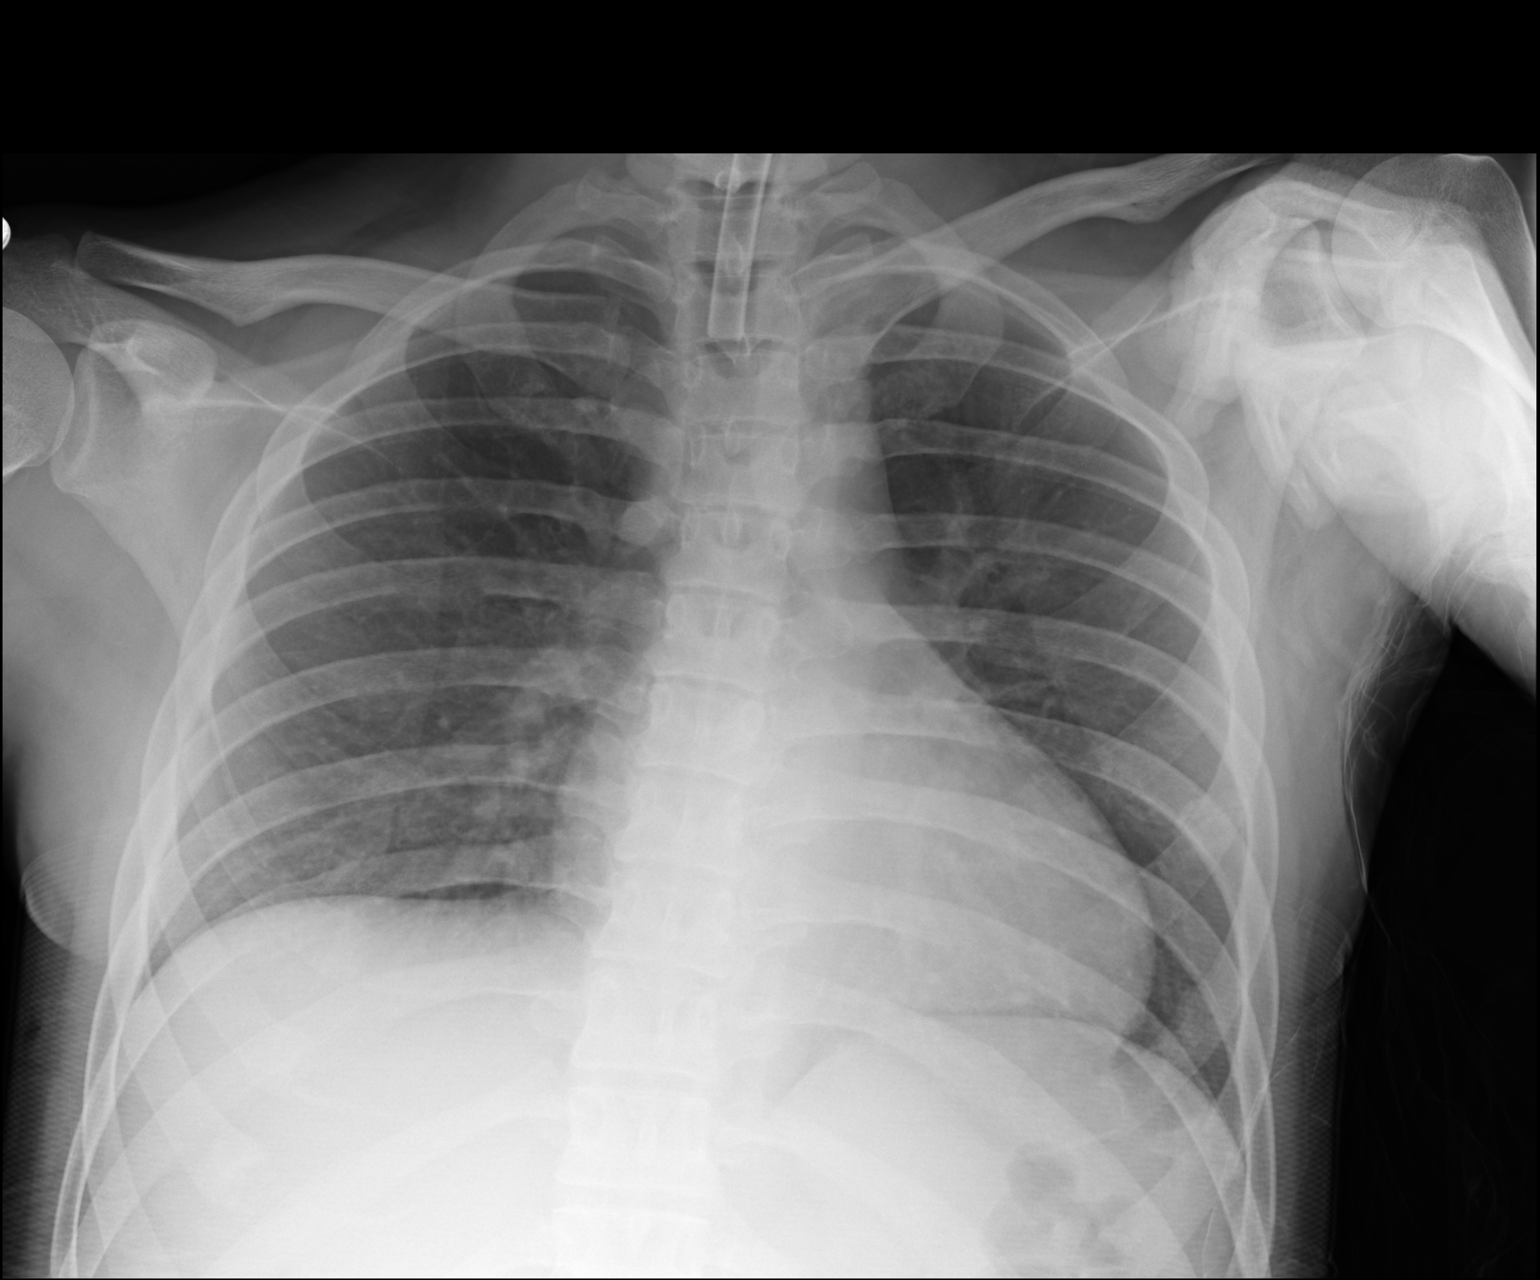

[1 of 1 positions shown; findings below may reference images not displayed]

FINDINGS: Tracheostomy tube tip is above the carina. Heart size is normal. No
pleural effusion or edema. No airspace consolidation.
IMPRESSION: 1. No acute cardiopulmonary abnormalities. No evidence for
pneumonia.

## 2017-01-05 ENCOUNTER — Encounter: Payer: Medicaid Other | Attending: Physical Medicine & Rehabilitation | Admitting: Physical Medicine & Rehabilitation

## 2017-02-23 ENCOUNTER — Ambulatory Visit: Payer: Self-pay | Admitting: Physical Medicine & Rehabilitation

## 2017-03-04 ENCOUNTER — Encounter: Payer: Medicaid Other | Attending: Physical Medicine & Rehabilitation | Admitting: Physical Medicine & Rehabilitation

## 2017-03-04 ENCOUNTER — Encounter: Payer: Self-pay | Admitting: Physical Medicine & Rehabilitation

## 2017-03-04 VITALS — BP 130/87 | HR 81

## 2017-03-04 DIAGNOSIS — I69319 Unspecified symptoms and signs involving cognitive functions following cerebral infarction: Secondary | ICD-10-CM | POA: Insufficient documentation

## 2017-03-04 MED ORDER — AMPHETAMINE-DEXTROAMPHETAMINE 5 MG PO TABS: 5.0000 mg | ORAL_TABLET | Freq: Every day | ORAL | 0 refills | Status: DC

## 2017-03-04 NOTE — Patient Instructions (Signed)
PLEASE FEEL FREE TO CALL OUR OFFICE WITH ANY PROBLEMS OR QUESTIONS ([phone])      
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
## 2017-04-08 ENCOUNTER — Other Ambulatory Visit: Payer: Self-pay

## 2017-04-08 ENCOUNTER — Encounter: Payer: Medicaid Other | Attending: Physical Medicine & Rehabilitation | Admitting: Physical Medicine & Rehabilitation

## 2017-04-08 ENCOUNTER — Encounter: Payer: Self-pay | Admitting: Physical Medicine & Rehabilitation

## 2017-04-08 DIAGNOSIS — I69319 Unspecified symptoms and signs involving cognitive functions following cerebral infarction: Secondary | ICD-10-CM | POA: Insufficient documentation

## 2017-04-08 MED ORDER — AMPHETAMINE-DEXTROAMPHETAMINE 10 MG PO TABS: 10.0000 mg | ORAL_TABLET | Freq: Every day | ORAL | 0 refills | Status: DC

## 2017-04-08 NOTE — Patient Instructions (Signed)
PLEASE FEEL FREE TO CALL OUR OFFICE WITH ANY PROBLEMS OR QUESTIONS ([phone])      

## 2017-04-08 NOTE — Progress Notes (Deleted)
Subjective:    Patient ID: Juan [name], male    DOB: 10/09/1990, 26 y.o.   MRN: [id number]  HPI Pain Inventory Average Pain  Pain Right Now {NUMBERS; 0-10:5044} My pain is {PAIN DESCRIPTION:21022940}  In the last 24 hours, has pain interfered with the following? General activity {NUMBERS; 0-10:5044} Relation with others {NUMBERS; 0-10:5044} Enjoyment of life {NUMBERS; 0-10:5044} What TIME of day is your pain at its worst? {TIME OF WUJ:81191478}AY:21022941} Sleep (in general) {BHH GOOD/FAIR/POOR:22877}  Pain is worse with: {ACTIVITIES:21022942} Pain improves with: {PAIN IMPROVES GNFA:21308657}WITH:21022943} Relief from Meds: {NUMBERS; 0-10:5044}  Mobility {MOBILITY QIO:96295284}ADL:21022944}  Function {FUNCTION:21022946}  Neuro/Psych {NEURO/PSYCH:21022948}  Prior Studies {CPRM PRIOR STUDIES:21022953}  Physicians involved in your care {CPRM PHYSICIANS INVOLVED IN YOUR CARE:21022954}   Family History  Problem Relation Age of Onset  . Diabetes Father   . Hypertension Mother   . Cancer Maternal Grandfather    Social History   Socioeconomic History  . Marital status: Single    Spouse name: Not on file  . Number of children: 1  . Years of education: Not on file  . Highest education level: Not on file  Social Needs  . Financial resource strain: Not on file  . Food insecurity - worry: Not on file  . Food insecurity - inability: Not on file  . Transportation needs - medical: Not on file  . Transportation needs - non-medical: Not on file  Occupational History  . Occupation: Unemployed  Tobacco Use  . Smoking status: Former Smoker    Types: Cigarettes  . Smokeless tobacco: Former Engineer, waterUser  Substance and Sexual Activity  . Alcohol use: No    Alcohol/week: 0.0 oz  . Drug use: No    Comment: last used 07/25/12  . Sexual activity: Yes  Other Topics Concern  . Not on file  Social History Narrative   ** Merged History Encounter **       Past Surgical History:  Procedure Laterality Date  .  ESOPHAGOGASTRODUODENOSCOPY (EGD) WITH PROPOFOL N/A 01/18/2015   Procedure: ESOPHAGOGASTRODUODENOSCOPY (EGD) WITH PROPOFOL;  Surgeon: [name], MD;  [facility];  Service: General;  Laterality: N/A;  . FEMUR IM NAIL Bilateral 01/06/2015   Procedure: IRRIGATION AND DEBRIDEMENT BILATERAL LEGS WITH APPLICATION EXTERNAL FIXATOR RIGHT  TIBIA AND APPLICATION EXTERNAL FIXATORS TO LEFT FEMUR  AND LEFT TIBIA ;  Surgeon: [name], MD;  Location: [facility] OR;  Service: Orthopedics;  Laterality: Bilateral;  . FEMUR IM NAIL Left 01/15/2015   Procedure: INTRAMEDULLARY (IM) RETROGRADE FEMORAL NAILING;  Surgeon: [name], MD;  Location: [facility] OR;  Service: Orthopedics;  Laterality: Left;  . I&D EXTREMITY Bilateral 01/15/2015   Procedure: IRRIGATION AND DEBRIDEMENT BILATERAL EXTREMITY;  Surgeon: [name], MD;  [facility];  Service: Orthopedics;  Laterality: Bilateral;  . ORIF PELVIC FRACTURE Bilateral 01/15/2015   Procedure: orif pelvis bilateral iliac screws percantaneous fixation left tavern, im nail bilateral tibia, retrograde im nail left femur, removal of external fixation ;  Surgeon: [name], MD;  [facility];  Service: Orthopedics;  Laterality: Bilateral;  . PEG PLACEMENT N/A 01/18/2015   Procedure: PERCUTANEOUS ENDOSCOPIC GASTROSTOMY (PEG) PLACEMENT;  Surgeon: [name], MD;  [facility];  Service: General;  Laterality: N/A;  bedside  . PERCUTANEOUS TRACHEOSTOMY N/A 01/18/2015   Procedure: PERCUTANEOUS TRACHEOSTOMY (BEDSIDE);  Surgeon: [name], MD;  [facility];  Service: General;  Laterality: N/A;  . TIBIA IM NAIL INSERTION Bilateral 01/15/2015   Procedure: INTRAMEDULLARY (IM) NAIL TIBIAL;  Surgeon: [name],  MD;  [facility]: [facility] OR;  Service: Orthopedics;  Laterality: Bilateral;   Past Medical History:  Diagnosis Date  . Closed left subtrochanteric femur fracture (HCC) 01/06/2015  . Open fracture of right tibia 01/06/2015  . Open fracture of shaft of  left tibia, type III 01/06/2015  . Seizures (HCC)    x 1 at age 26   There were no vitals taken for this visit.  Opioid Risk Score:   Fall Risk Score:  `1  Depression screen PHQ 2/9  Dep[facility] screen [facility] 2/9 04/08/2017 04/07/2016 07/25/2015  Decreased Interest 0 0 0  Down, Depressed, Hopeless 0 0 0  PHQ - 2 Score 0 0 0     Review of Systems     Objective:   Physical Exam        Assessment & Plan:
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
## 2017-05-26 ENCOUNTER — Other Ambulatory Visit: Payer: Self-pay | Admitting: Physical Medicine & Rehabilitation

## 2017-05-26 NOTE — Telephone Encounter (Signed)
Recieved electronic medication request for Mobic 7.5mg , patient is not currently prescribed that medication from this clinic and is currently prescribed ibuprofen from here.  Please advise.

## 2017-05-29 ENCOUNTER — Encounter: Payer: Self-pay | Admitting: Neurology

## 2017-05-29 ENCOUNTER — Ambulatory Visit (INDEPENDENT_AMBULATORY_CARE_PROVIDER_SITE_OTHER): Payer: Medicaid Other | Admitting: Neurology

## 2017-05-29 VITALS — BP 122/84 | HR 89 | Ht 67.0 in | Wt 180.0 lb

## 2017-05-29 DIAGNOSIS — Z87820 Personal history of traumatic brain injury: Secondary | ICD-10-CM | POA: Diagnosis not present

## 2017-05-29 NOTE — Patient Instructions (Signed)
Schedule Neuropsychological testing with Dr. Alinda DoomsBailar. Follow-up as needed, call for any changes
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
## 2017-06-07 ENCOUNTER — Other Ambulatory Visit: Payer: Self-pay | Admitting: Physical Medicine & Rehabilitation

## 2017-06-07 DIAGNOSIS — S7222XS Displaced subtrochanteric fracture of left femur, sequela: Secondary | ICD-10-CM

## 2017-06-07 DIAGNOSIS — S069X4S Unspecified intracranial injury with loss of consciousness of 6 hours to 24 hours, sequela: Secondary | ICD-10-CM

## 2017-06-07 DIAGNOSIS — S32009S Unspecified fracture of unspecified lumbar vertebra, sequela: Secondary | ICD-10-CM

## 2017-06-08 ENCOUNTER — Other Ambulatory Visit: Payer: Self-pay

## 2017-06-08 ENCOUNTER — Encounter: Payer: Medicaid Other | Attending: Physical Medicine & Rehabilitation | Admitting: Physical Medicine & Rehabilitation

## 2017-06-08 ENCOUNTER — Encounter: Payer: Self-pay | Admitting: Physical Medicine & Rehabilitation

## 2017-06-08 VITALS — BP 127/81 | HR 100

## 2017-06-08 DIAGNOSIS — Z87820 Personal history of traumatic brain injury: Secondary | ICD-10-CM

## 2017-06-08 DIAGNOSIS — I69319 Unspecified symptoms and signs involving cognitive functions following cerebral infarction: Secondary | ICD-10-CM | POA: Diagnosis not present

## 2017-06-08 MED ORDER — AMPHETAMINE-DEXTROAMPHETAMINE 10 MG PO TABS: 10.0000 mg | ORAL_TABLET | Freq: Every day | ORAL | 0 refills | Status: DC

## 2017-06-08 MED ORDER — DICLOFENAC SODIUM 50 MG PO TBEC: 50.0000 mg | DELAYED_RELEASE_TABLET | Freq: Two times a day (BID) | ORAL | 3 refills | Status: DC
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
## 2017-06-08 NOTE — Patient Instructions (Addendum)
PLEASE FEEL FREE TO CALL OUR OFFICE WITH ANY PROBLEMS OR QUESTIONS [phone])    CALL ME A WEEK PRIOR TO RUNNING OUT TO FILL YOUR ADDERALL IF YOU WANT ME TO REFILL

## 2017-06-10 ENCOUNTER — Telehealth: Payer: Self-pay

## 2017-06-10 NOTE — Telephone Encounter (Signed)
Recieved fax from patients pharmacy needed prior auth for diclofenac sodium tabs, started 06-09-17

## 2017-06-10 NOTE — Telephone Encounter (Signed)
Prior auth returned today as approved 06-10-17
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
## 2017-08-31 DIAGNOSIS — E291 Testicular hypofunction: Secondary | ICD-10-CM | POA: Diagnosis not present

## 2017-08-31 DIAGNOSIS — E559 Vitamin D deficiency, unspecified: Secondary | ICD-10-CM | POA: Diagnosis not present

## 2017-08-31 DIAGNOSIS — R5383 Other fatigue: Secondary | ICD-10-CM | POA: Diagnosis not present

## 2017-10-05 ENCOUNTER — Encounter: Payer: Self-pay | Admitting: Physical Medicine & Rehabilitation

## 2017-10-05 ENCOUNTER — Encounter: Payer: Medicare Other | Attending: Physical Medicine & Rehabilitation | Admitting: Physical Medicine & Rehabilitation

## 2017-10-05 VITALS — BP 116/78 | HR 77 | Ht 67.0 in | Wt 185.0 lb

## 2017-10-05 DIAGNOSIS — Z87891 Personal history of nicotine dependence: Secondary | ICD-10-CM | POA: Diagnosis not present

## 2017-10-05 DIAGNOSIS — Z8781 Personal history of (healed) traumatic fracture: Secondary | ICD-10-CM | POA: Diagnosis not present

## 2017-10-05 DIAGNOSIS — Z87820 Personal history of traumatic brain injury: Secondary | ICD-10-CM | POA: Insufficient documentation

## 2017-10-05 DIAGNOSIS — Z809 Family history of malignant neoplasm, unspecified: Secondary | ICD-10-CM | POA: Diagnosis not present

## 2017-10-05 DIAGNOSIS — T791XXD Fat embolism (traumatic), subsequent encounter: Secondary | ICD-10-CM | POA: Insufficient documentation

## 2017-10-05 DIAGNOSIS — I69319 Unspecified symptoms and signs involving cognitive functions following cerebral infarction: Secondary | ICD-10-CM | POA: Diagnosis not present

## 2017-10-05 DIAGNOSIS — Z833 Family history of diabetes mellitus: Secondary | ICD-10-CM | POA: Diagnosis not present

## 2017-10-05 DIAGNOSIS — Z09 Encounter for follow-up examination after completed treatment for conditions other than malignant neoplasm: Secondary | ICD-10-CM | POA: Diagnosis not present

## 2017-10-05 DIAGNOSIS — Z9889 Other specified postprocedural states: Secondary | ICD-10-CM | POA: Diagnosis not present

## 2017-10-05 DIAGNOSIS — Z8249 Family history of ischemic heart disease and other diseases of the circulatory system: Secondary | ICD-10-CM | POA: Diagnosis not present

## 2017-10-05 MED ORDER — MELOXICAM 15 MG PO TABS: 15.0000 mg | ORAL_TABLET | Freq: Every day | ORAL | 3 refills | Status: DC | PRN

## 2017-10-05 MED ORDER — AMPHETAMINE-DEXTROAMPHETAMINE 10 MG PO TABS: 10.0000 mg | ORAL_TABLET | Freq: Every day | ORAL | 0 refills | Status: DC
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
## 2017-10-05 NOTE — Patient Instructions (Signed)
PLEASE FEEL FREE TO CALL OUR OFFICE WITH ANY PROBLEMS OR QUESTIONS ([phone])      

## 2017-10-13 ENCOUNTER — Ambulatory Visit (INDEPENDENT_AMBULATORY_CARE_PROVIDER_SITE_OTHER): Payer: Medicare Other | Admitting: Psychology

## 2017-10-13 ENCOUNTER — Encounter: Payer: Self-pay | Admitting: Psychology

## 2017-10-13 DIAGNOSIS — R413 Other amnesia: Secondary | ICD-10-CM

## 2017-10-13 DIAGNOSIS — S069X9S Unspecified intracranial injury with loss of consciousness of unspecified duration, sequela: Secondary | ICD-10-CM
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
## 2017-10-23 ENCOUNTER — Telehealth: Payer: Self-pay | Admitting: Psychology

## 2017-10-23 ENCOUNTER — Encounter: Payer: Self-pay | Admitting: Psychology

## 2017-10-23 ENCOUNTER — Ambulatory Visit: Payer: Medicare Other | Admitting: Psychology

## 2017-10-23 DIAGNOSIS — S069X9S Unspecified intracranial injury with loss of consciousness of unspecified duration, sequela: Secondary | ICD-10-CM

## 2017-10-23 NOTE — Progress Notes (Signed)
   Neuropsychology Note  Juan LuxJacob [name] completed 120 minutes of neuropsychological testing with technician, Wallace Kellerana Chamberlain, BS, under the supervision of Dr. Elvis CoilMaryBeth Bailar, Licensed Psychologist. The patient did not appear overtly distressed by the testing session, per behavioral observation or via self-report to the technician. Rest breaks were offered.   Clinical Decision Making: In considering the patient's current level of functioning, level of presumed impairment, nature of symptoms, emotional and behavioral responses during the interview, level of literacy, and observed level of motivation/effort, a battery of tests was selected and communicated to the psychometrician.  Communication between the psychologist and technician was ongoing throughout the testing session and changes were made as deemed necessary based on patient performance on testing, technician observations and additional pertinent factors such as those listed above.  Juan LuxJacob [name] will return within approximately 2 weeks for an interactive feedback session with Dr. Alinda DoomsBailar at which time his test performances, clinical impressions and treatment recommendations will be reviewed in detail. The patient understands he can contact our office should he require our assistance before this time.  35 minutes spent performing neuropsychological evaluation services/clinical decision making (psychologist). [CPT 96132] 120 minutes spent face-to-face with patient administering standardized tests, 30 minutes spent scoring (technician). [CPT [id number], 96139]  Full report to follow.

## 2017-10-23 NOTE — Telephone Encounter (Signed)
Patient needed to cancel his appointment for his results due to his work schedule. I do not see any other follow up appointments available. Is there a way he can be given his results over the phone? He also would like for his results to be sent to Dr. Arvilla MarketShwartz [facility](Rehabilitaion Specialist). Thanks

## 2017-10-27 DIAGNOSIS — R194 Change in bowel habit: Secondary | ICD-10-CM | POA: Diagnosis not present

## 2017-10-29 ENCOUNTER — Encounter (INDEPENDENT_AMBULATORY_CARE_PROVIDER_SITE_OTHER): Payer: Medicare Other | Admitting: Psychology

## 2017-10-29 ENCOUNTER — Encounter: Payer: Self-pay | Admitting: Psychology

## 2017-10-29 DIAGNOSIS — S069X9S Unspecified intracranial injury with loss of consciousness of unspecified duration, sequela: Secondary | ICD-10-CM

## 2017-10-29 DIAGNOSIS — R413 Other amnesia: Secondary | ICD-10-CM
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
## 2017-11-03 ENCOUNTER — Encounter: Payer: Self-pay | Admitting: Psychology

## 2017-11-03 NOTE — Telephone Encounter (Signed)
I spoke with the patient and his girlfriend today via phone to review the results of his neuropsychological evaluation. Full report available in Epic. Report was also mailed to the patient, and forwarded to his providers.

## 2018-02-23 DIAGNOSIS — E559 Vitamin D deficiency, unspecified: Secondary | ICD-10-CM | POA: Diagnosis not present

## 2018-02-23 DIAGNOSIS — R5383 Other fatigue: Secondary | ICD-10-CM | POA: Diagnosis not present

## 2018-02-23 DIAGNOSIS — F524 Premature ejaculation: Secondary | ICD-10-CM | POA: Diagnosis not present

## 2018-02-23 DIAGNOSIS — R194 Change in bowel habit: Secondary | ICD-10-CM | POA: Diagnosis not present

## 2018-04-01 DIAGNOSIS — Z23 Encounter for immunization: Secondary | ICD-10-CM | POA: Diagnosis not present

## 2018-04-01 DIAGNOSIS — E559 Vitamin D deficiency, unspecified: Secondary | ICD-10-CM | POA: Diagnosis not present

## 2018-04-01 DIAGNOSIS — R6882 Decreased libido: Secondary | ICD-10-CM | POA: Diagnosis not present

## 2018-04-01 DIAGNOSIS — R5383 Other fatigue: Secondary | ICD-10-CM | POA: Diagnosis not present

## 2018-04-01 DIAGNOSIS — E291 Testicular hypofunction: Secondary | ICD-10-CM | POA: Diagnosis not present

## 2018-04-01 DIAGNOSIS — F524 Premature ejaculation: Secondary | ICD-10-CM | POA: Diagnosis not present

## 2018-04-07 ENCOUNTER — Encounter: Payer: Medicare Other | Attending: Physical Medicine & Rehabilitation | Admitting: Physical Medicine & Rehabilitation

## 2018-04-07 ENCOUNTER — Encounter: Payer: Self-pay | Admitting: Physical Medicine & Rehabilitation

## 2018-04-07 VITALS — BP 123/81 | HR 95 | Resp 14 | Ht 67.0 in | Wt 182.0 lb

## 2018-04-07 DIAGNOSIS — M170 Bilateral primary osteoarthritis of knee: Secondary | ICD-10-CM | POA: Diagnosis not present

## 2018-04-07 DIAGNOSIS — Z87820 Personal history of traumatic brain injury: Secondary | ICD-10-CM | POA: Diagnosis not present

## 2018-04-07 DIAGNOSIS — X58XXXS Exposure to other specified factors, sequela: Secondary | ICD-10-CM | POA: Diagnosis not present

## 2018-04-07 DIAGNOSIS — T791XXS Fat embolism (traumatic), sequela: Secondary | ICD-10-CM | POA: Insufficient documentation

## 2018-04-07 DIAGNOSIS — I69319 Unspecified symptoms and signs involving cognitive functions following cerebral infarction: Secondary | ICD-10-CM

## 2018-04-07 DIAGNOSIS — S82251F Displaced comminuted fracture of shaft of right tibia, subsequent encounter for open fracture type IIIA, IIIB, or IIIC with routine healing: Secondary | ICD-10-CM | POA: Diagnosis not present

## 2018-04-07 DIAGNOSIS — M25552 Pain in left hip: Secondary | ICD-10-CM | POA: Diagnosis not present

## 2018-04-07 DIAGNOSIS — S32511D Fracture of superior rim of right pubis, subsequent encounter for fracture with routine healing: Secondary | ICD-10-CM | POA: Diagnosis not present

## 2018-04-07 DIAGNOSIS — S72352D Displaced comminuted fracture of shaft of left femur, subsequent encounter for closed fracture with routine healing: Secondary | ICD-10-CM | POA: Diagnosis not present

## 2018-04-07 DIAGNOSIS — S3282XS Multiple fractures of pelvis without disruption of pelvic ring, sequela: Secondary | ICD-10-CM | POA: Diagnosis not present

## 2018-04-07 DIAGNOSIS — M1712 Unilateral primary osteoarthritis, left knee: Secondary | ICD-10-CM | POA: Diagnosis not present

## 2018-04-07 MED ORDER — MELOXICAM 15 MG PO TABS: 15.0000 mg | ORAL_TABLET | Freq: Every day | ORAL | 3 refills | Status: DC | PRN

## 2018-04-07 NOTE — Progress Notes (Signed)
Subjective:    Patient ID: Juan [name], male    DOB: 1990/09/17, 27 y.o.   MRN: [id number]  HPI [name] is here in follow up of his fat emboli syndrome. He is now working at [employer] in [employer].  He is working around 20 hours a week.  He remains very limited by concentration and memory.  He is able to meet the demands of his job which are fairly routine.  He is also limited from a pain and fatigue standpoint by his hip and left knee pain.  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  Fortunately electronic station is small enough where he can manage his pain in the short hours he is working.  He has spoken with Dr. Carola Frost regarding further intervention regarding the left leg.  [name] does remark that he has had left knee pain prior to the accident and remembers swelling in the leg.  Currently the knee will swell when he standing on it for any prolonged period of time or if he is walking on it.  He is taking meloxicam on the days he works in general.  Vickie continues on thyroid supplementation per his primary.  He stopped taking the Adderall as it was causing GI upset.  Even being off the Adderall he was able to focus enough to meet the basic demands of his job.  Pain Inventory Average Pain 0 Pain Right Now 0 My pain is no pain  In the last 24 hours, has pain interfered with the following? General activity 0 Relation with others 0 Enjoyment of life 0 What TIME of day is your pain at its worst? no pain Sleep (in general) Good  Pain is worse with: no pain Pain improves with: no pain Relief from Meds: no pain  Mobility walk without assistance ability to climb steps?  yes do you drive?  yes  Function employed # of hrs/week .  Neuro/Psych No problems in this area  Prior Studies Any changes since last visit?  no  Physicians involved in your care Any changes since last visit?  no   Family History  Problem Relation Age of  Onset  . Diabetes Father   . Hypertension Mother   . Cancer Maternal Grandfather    Social History   Socioeconomic History  . Marital status: Single    Spouse name: Not on file  . Number of children: 1  . Years of education: Not on file  . Highest education level: Not on file  Occupational History  . Occupation: Unemployed  Social Needs  . Financial resource strain: Not on file  . Food insecurity:    Worry: Not on file    Inability: Not on file  . Transportation needs:    Medical: Not on file    Non-medical: Not on file  Tobacco Use  . Smoking status: Former Smoker    Types: Cigarettes  . Smokeless tobacco: Former Engineer, water and Sexual Activity  . Alcohol use: No    Alcohol/week: 0.0 standard drinks  . Drug use: No    Types: Marijuana    Comment: last used 07/25/12  . Sexual activity: Yes  Lifestyle  . Physical activity:    Days per week: Not on file    Minutes per session: Not on file  . Stress: Not on file  Relationships  . Social connections:    Talks on phone: Not on file  Gets together: Not on file    Attends religious service: Not on file    Active member of club or organization: Not on file    Attends meetings of clubs or organizations: Not on file    Relationship status: Not on file  Other Topics Concern  . Not on file  Social History Narrative   ** Merged History Encounter **       Past Surgical History:  Procedure Laterality Date  . ESOPHAGOGASTRODUODENOSCOPY (EGD) WITH PROPOFOL N/A 01/18/2015   Procedure: ESOPHAGOGASTRODUODENOSCOPY (EGD) WITH PROPOFOL;  Surgeon: [name], MD;  [facility];  Service: General;  Laterality: N/A;  . FEMUR IM NAIL Bilateral 01/06/2015   Procedure: IRRIGATION AND DEBRIDEMENT BILATERAL LEGS WITH APPLICATION EXTERNAL FIXATOR RIGHT  TIBIA AND APPLICATION EXTERNAL FIXATORS TO LEFT FEMUR  AND LEFT TIBIA ;  Surgeon: [name], MD;  Location: [facility] OR;  Service: Orthopedics;  Laterality: Bilateral;  . FEMUR  IM NAIL Left 01/15/2015   Procedure: INTRAMEDULLARY (IM) RETROGRADE FEMORAL NAILING;  Surgeon: [name], MD;  Location: [facility] OR;  Service: Orthopedics;  Laterality: Left;  . I&D EXTREMITY Bilateral 01/15/2015   Procedure: IRRIGATION AND DEBRIDEMENT BILATERAL EXTREMITY;  Surgeon: [name], MD;  [facility];  Service: Orthopedics;  Laterality: Bilateral;  . ORIF PELVIC FRACTURE Bilateral 01/15/2015   Procedure: orif pelvis bilateral iliac screws percantaneous fixation left tavern, im nail bilateral tibia, retrograde im nail left femur, removal of external fixation ;  Surgeon: [name], MD;  [facility];  Service: Orthopedics;  Laterality: Bilateral;  . PEG PLACEMENT N/A 01/18/2015   Procedure: PERCUTANEOUS ENDOSCOPIC GASTROSTOMY (PEG) PLACEMENT;  Surgeon: [name], MD;  [facility];  Service: General;  Laterality: N/A;  bedside  . PERCUTANEOUS TRACHEOSTOMY N/A 01/18/2015   Procedure: PERCUTANEOUS TRACHEOSTOMY (BEDSIDE);  Surgeon: [name], MD;  [facility];  Service: General;  Laterality: N/A;  . TIBIA IM NAIL INSERTION Bilateral 01/15/2015   Procedure: INTRAMEDULLARY (IM) NAIL TIBIAL;  Surgeon: [name], MD;  [facility];  Service: Orthopedics;  Laterality: Bilateral;   Past Medical History:  Diagnosis Date  . Closed left subtrochanteric femur fracture (HCC) 01/06/2015  . Open fracture of right tibia 01/06/2015  . Open fracture of shaft of left tibia, type III 01/06/2015  . Seizures (HCC)    x 1 at age 27   BP 123/81 (BP Location: Right Arm, Patient Position: Sitting, Cuff Size: Normal)   Pulse 95   Resp 14   Ht 5\' 7"  (1.702 m)   Wt 182 lb (82.6 kg)   SpO2 97%   BMI 28.51 kg/m   Opioid Risk Score:   Fall Risk Score:  `1  Depression screen PHQ 2/9  Depression screen [facility] 06/08/2017 04/08/2017 04/07/2016 07/25/2015  Decreased Interest 0 0 0 0  Down, Depressed, Hopeless 0 0 0 0  PHQ - 2 Score 0 0 0 0   2 Review of Systems   Constitutional: Negative.   HENT: Negative.   Eyes: Negative.   Respiratory: Negative.   Cardiovascular: Negative.   Gastrointestinal: Negative.   Endocrine: Negative.   Genitourinary: Negative.   Musculoskeletal: Negative.   Skin: Negative.   Allergic/Immunologic: Negative.   Neurological: Negative.   Hematological: Negative.   Psychiatric/Behavioral: Negative.        Objective:   Physical Exam General: No acute distress HEENT: EOMI, oral membranes moist Cards: reg rate  Chest: normal effort Abdomen: Soft, NT, ND Skin: dry, intact Extremities: no edema  Musculoskeletal:Left  knee effusion noted.  Area slightly tender.  Is antalgic with weightbearing on the left side. Neurological:Intention and focus are functional but he loses train of thought at times during conversation and needs frequent reminders regarding certain topics.  There is better with visual cues... Reasonable insight and awareness.  Processing speed improved but still slower than normal..  Motor 5/5 in all 4's. Sensation intact. Skin:  Numerous surgical scars on both LE's, healed. Tattoos.--no changes  Psychiatric:Pleasant and appropriate as always   Assessment & Plan:  1. 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  However, I do understand that it was causing GI upset and he is off the medication at present.  He seems to be making do without it for the time being so I did not pursue discussions regarding other options there.  -Although he has worked on a part-time basis he remains very limited due to his physical and cognitive deficits as to what type of work and what intensity and work he can pursue.  I really do not see him being able to tolerate more than 20 hours or so per week of sedentary/routine work which does not  require excessive concentration or detailed processing or high level organizational skills. 2.  Thyroid supplementation per primary 3. Pain Management/post traumatic arthritis of knees: meloxicam worked better for him than diclofenac, changed him back to this today  -Discussed options for pain mgt---supplements, knee brace, ice, injections  -He will discuss any surgical options or therapy with orthopedic surgery   4. Multiple orthopedic fractures, L1-2 transverse process fractures,/left femur fracture with IM nailing, left and right open tibia fibular fractures, transverse acetabulum fracture status post percutaneous pinning,CM pelvic ring status post ORIF, pubic symphysis and trans-sacral screw forL S I diastasis and right sacr-alalo fracture.  5. Driving--low traffic driving only    15minutes of face to face patient care time were spent during this visit. All questions were encouraged and answered.  F/u with me as needed.

## 2018-04-07 NOTE — Patient Instructions (Signed)
SUPPLEMENTS USEFUL FOR OSTEOARTHRITIS: OMEGA 3 FATTY ACIDS, TURMERIC, GINGER, TART CHERRY EXTRACT, CELERY SEED, GLUCOSAMINE WITH CHONDROITIN   ?

## 2018-05-24 DIAGNOSIS — F524 Premature ejaculation: Secondary | ICD-10-CM | POA: Diagnosis not present

## 2018-07-05 DIAGNOSIS — R5383 Other fatigue: Secondary | ICD-10-CM | POA: Diagnosis not present

## 2018-07-05 DIAGNOSIS — F524 Premature ejaculation: Secondary | ICD-10-CM | POA: Diagnosis not present

## 2018-07-13 ENCOUNTER — Other Ambulatory Visit: Payer: Self-pay | Admitting: Physical Medicine & Rehabilitation

## 2018-07-13 DIAGNOSIS — I69319 Unspecified symptoms and signs involving cognitive functions following cerebral infarction: Secondary | ICD-10-CM

## 2018-07-13 DIAGNOSIS — Z87820 Personal history of traumatic brain injury: Secondary | ICD-10-CM

## 2018-11-08 ENCOUNTER — Ambulatory Visit (INDEPENDENT_AMBULATORY_CARE_PROVIDER_SITE_OTHER): Payer: Self-pay | Admitting: Internal Medicine

## 2019-03-01 ENCOUNTER — Telehealth (INDEPENDENT_AMBULATORY_CARE_PROVIDER_SITE_OTHER): Payer: Self-pay

## 2019-03-01 DIAGNOSIS — R197 Diarrhea, unspecified: Secondary | ICD-10-CM

## 2019-03-01 NOTE — Telephone Encounter (Signed)
PT want to know if he can get a referral to see the GI dr? Pt was seen perviously brefore for this . Pt is having the trouble again. Pt was instructed to go to urgent care due to no opening today to help today.. Pt is getting tired of constantly having to go more then normal. Pt spouse ask if could get a referral to go to GI MD?

## 2019-03-01 NOTE — Telephone Encounter (Signed)
Order sent to [place]

## 2019-03-01 NOTE — Telephone Encounter (Signed)
Yes, that is fine, he can go to gastroenterology again for diarrhea.

## 2019-03-02 ENCOUNTER — Encounter: Payer: Self-pay | Admitting: Internal Medicine

## 2019-03-07 ENCOUNTER — Other Ambulatory Visit: Payer: Self-pay

## 2019-03-07 ENCOUNTER — Ambulatory Visit (INDEPENDENT_AMBULATORY_CARE_PROVIDER_SITE_OTHER): Payer: Medicare Other

## 2019-03-07 ENCOUNTER — Ambulatory Visit
Admission: EM | Admit: 2019-03-07 | Discharge: 2019-03-07 | Disposition: A | Discharge status: Home / Self Care | Payer: Medicare Other | Attending: Emergency Medicine | Admitting: Emergency Medicine

## 2019-03-07 DIAGNOSIS — M25571 Pain in right ankle and joints of right foot: Secondary | ICD-10-CM

## 2019-03-07 DIAGNOSIS — S92414A Nondisplaced fracture of proximal phalanx of right great toe, initial encounter for closed fracture: Secondary | ICD-10-CM

## 2019-03-07 DIAGNOSIS — S90111A Contusion of right great toe without damage to nail, initial encounter: Secondary | ICD-10-CM | POA: Diagnosis not present

## 2019-03-07 MED ORDER — NAPROXEN 500 MG PO TABS: 500.0000 mg | ORAL_TABLET | Freq: Two times a day (BID) | ORAL | 0 refills | Status: DC

## 2019-03-07 NOTE — Discharge Instructions (Addendum)
X-rays concerning for broken toe Post-op shoe given and buddy tape applied Continue conservative management of rest, ice, and gentle stretches Take naproxen as needed for pain relief (may cause abdominal discomfort, ulcers, and GI bleeds avoid taking with other NSAIDs) Follow up with PCP or orthopedist in 1-2 weeks for recheck and to ensure your symptoms are improving Return or go to the ER if you have any new or worsening symptoms (fever, chills, increased swelling, redness, chest pain, worsening symptoms despite treatment, etc...)

## 2019-03-07 NOTE — ED Provider Notes (Signed)
[facility]   [id number] 03/07/19 Arrival Time: 8921  CC: Right great toe pain  SUBJECTIVE: History from: patient. Juan [name] is a 28 y.o. male complains of right great toe that began 2 days ago.  Symptoms began after pallet fell on his RT great toe.  Localizes the pain to the RT great toe.  Describes the pain as constant and achy, and intermittently throbbing in character.  Has tried OTC medications without relief.  Symptoms are made worse with walking.  Complains of swelling and bruising.  Denies fever, chills, erythema, weakness, numbness and tingling.  ROS: As per HPI.  All other pertinent ROS negative.     Past Medical History:  Diagnosis Date   Closed left subtrochanteric femur fracture ([place]) 01/06/2015   Open fracture of right tibia 01/06/2015   Open fracture of shaft of left tibia, type III 01/06/2015   Seizures ([place])    x 1 at age 61   Past Surgical History:  Procedure Laterality Date   ESOPHAGOGASTRODUODENOSCOPY (EGD) WITH PROPOFOL N/A 01/18/2015   Procedure: ESOPHAGOGASTRODUODENOSCOPY (EGD) WITH PROPOFOL;  Surgeon: Georganna Skeans, MD;  Location: [place];  Service: General;  Laterality: N/A;   FEMUR IM NAIL Bilateral 01/06/2015   Procedure: IRRIGATION AND DEBRIDEMENT BILATERAL LEGS WITH APPLICATION EXTERNAL FIXATOR RIGHT  TIBIA AND APPLICATION EXTERNAL FIXATORS TO LEFT FEMUR  AND LEFT TIBIA ;  Surgeon: Marchia Bond, MD;  Location: [place];  Service: Orthopedics;  Laterality: Bilateral;   FEMUR IM NAIL Left 01/15/2015   Procedure: INTRAMEDULLARY (IM) RETROGRADE FEMORAL NAILING;  Surgeon: Altamese Cherryland, MD;  Location: [place];  Service: Orthopedics;  Laterality: Left;   I&D EXTREMITY Bilateral 01/15/2015   Procedure: IRRIGATION AND DEBRIDEMENT BILATERAL EXTREMITY;  Surgeon: Altamese Lewisville, MD;  Location: [facility];  Service: Orthopedics;  Laterality: Bilateral;   ORIF PELVIC FRACTURE Bilateral 01/15/2015   Procedure: orif pelvis bilateral iliac screws percantaneous  fixation left tavern, im nail bilateral tibia, retrograde im nail left femur, removal of external fixation ;  Surgeon: Altamese Fleming, MD;  Location: [facility];  Service: Orthopedics;  Laterality: Bilateral;   PEG PLACEMENT N/A 01/18/2015   Procedure: PERCUTANEOUS ENDOSCOPIC GASTROSTOMY (PEG) PLACEMENT;  Surgeon: Georganna Skeans, MD;  Location: [place];  Service: General;  Laterality: N/A;  bedside   PERCUTANEOUS TRACHEOSTOMY N/A 01/18/2015   Procedure: PERCUTANEOUS TRACHEOSTOMY (BEDSIDE);  Surgeon: Georganna Skeans, MD;  Location: [place];  Service: General;  Laterality: N/A;   TIBIA IM NAIL INSERTION Bilateral 01/15/2015   Procedure: INTRAMEDULLARY (IM) NAIL TIBIAL;  Surgeon: Altamese [place] Ricard, MD;  Location: [facility];  Service: Orthopedics;  Laterality: Bilateral;   No Known Allergies No current facility-administered medications on file prior to encounter.    Current Outpatient Medications on File Prior to Encounter  Medication Sig Dispense Refill   thyroid (ARMOUR) 30 MG tablet Take 30 mg by mouth daily before breakfast.     Social History   Socioeconomic History   Marital status: Single    Spouse name: Not on file   Number of children: 1   Years of education: Not on file   Highest education level: Not on file  Occupational History   Occupation: Unemployed  Social Designer, fashion/clothing strain: Not on file   Food insecurity    Worry: Not on file    Inability: Not on file   Transportation needs    Medical: Not on file    Non-medical: Not on file  Tobacco Use   Smoking status: Former Smoker  Types: Cigarettes   Smokeless tobacco: Former Engineer, waterUser  Substance and Sexual Activity   Alcohol use: No    Alcohol/week: 0.0 standard drinks   Drug use: No    Types: Marijuana    Comment: last used 07/25/12   Sexual activity: Yes  Lifestyle   Physical activity    Days per week: Not on file    Minutes per session: Not on file   Stress: Not on file  Relationships   Social  connections    Talks on phone: Not on file    Gets together: Not on file    Attends religious service: Not on file    Active member of club or organization: Not on file    Attends meetings of clubs or organizations: Not on file    Relationship status: Not on file   Intimate partner violence    Fear of current or ex partner: Not on file    Emotionally abused: Not on file    Physically abused: Not on file    Forced sexual activity: Not on file  Other Topics Concern   Not on file  Social History Narrative   ** Merged History Encounter **       Family History  Problem Relation Age of Onset   Diabetes Father    Hypertension Mother    Cancer Maternal Grandfather     OBJECTIVE:  Vitals:   03/07/19 1024  BP: (!) 133/57  Pulse: 78  Resp: 16  Temp: 98.1 F (36.7 C)  TempSrc: Oral  SpO2: 99%    General appearance: ALERT; in no acute distress.  Head: NCAT Lungs: Normal respiratory effort CV: Dorsalis pedis pulses 2+. Cap refill < 2 seconds Musculoskeletal: Right foot Inspection: Ecchymosis and swelling RT great toe Palpation: Diffusely TTP over RT great toe especially over proximal phalanx ROM: LROM about the toe Strength: 5/5 dorsiflexion, 5/5 plantar flexion Skin: warm and dry Neurologic: Ambulates with minimal difficulty; Sensation intact about the lower extremities Psychological: alert and cooperative; normal mood and affect  DIAGNOSTIC STUDIES:  Dg Toe Great Right  Result Date: 03/07/2019 CLINICAL DATA:  Heavy object fell on great toe 2 days ago. Bruising, swelling, pain EXAM: RIGHT GREAT TOE COMPARISON:  None. FINDINGS: There is cortical irregularity noted along the lateral aspect of the right great toe proximal phalanx which could reflect a nondisplaced proximal phalangeal fracture. No subluxation or dislocation. No additional acute bony abnormality. IMPRESSION: Cortical irregularity along the lateral aspect of the right great toe proximal phalanx concerning for  nondisplaced fracture. Electronically Signed   By: Charlett NoseKevin  Dover M.D.   On: 03/07/2019 11:13    X-rays positive for nondisplaced proximal phalanx fracture of the RT great toe  I have reviewed the x-rays myself and the radiologist interpretation. I am in agreement with the radiologist interpretation.     ASSESSMENT & PLAN:  1. Nondisplaced fracture of proximal phalanx of right great toe, initial encounter for closed fracture   2. Pain in joint involving right ankle and foot    Meds ordered this encounter  Medications   naproxen (NAPROSYN) 500 MG tablet    Sig: Take 1 tablet (500 mg total) by mouth 2 (two) times daily.    Dispense:  30 tablet    Refill:  0    Order Specific Question:   Supervising Provider    Answer:   Eustace MooreELSON, YVONNE SUE [[id number]]   X-rays concerning for broken toe Post-op shoe given and buddy tape applied Continue conservative management of  rest, ice, and gentle stretches Take naproxen as needed for pain relief (may cause abdominal discomfort, ulcers, and GI bleeds avoid taking with other NSAIDs) Follow up with PCP or orthopedist in 1-2 weeks for recheck and to ensure your symptoms are improving Return or go to the ER if you have any new or worsening symptoms (fever, chills, increased swelling, redness, chest pain, worsening symptoms despite treatment, etc...)  Work restrictions given  Reviewed expectations re: course of current medical issues. Questions answered. Outlined signs and symptoms indicating need for more acute intervention. Patient verbalized understanding. After Visit Summary given.    Rennis Harding, PA-C 03/07/19 1515

## 2019-03-07 NOTE — ED Triage Notes (Signed)
Pt presents to UC w/ c/o right large toe injury x2 days ago. Pt's right large toe is bruised and has full ROM.

## 2019-03-15 ENCOUNTER — Encounter (INDEPENDENT_AMBULATORY_CARE_PROVIDER_SITE_OTHER): Payer: Self-pay | Admitting: Nurse Practitioner

## 2019-03-15 ENCOUNTER — Ambulatory Visit (INDEPENDENT_AMBULATORY_CARE_PROVIDER_SITE_OTHER): Payer: Medicare Other | Admitting: Nurse Practitioner

## 2019-03-15 DIAGNOSIS — S92414A Nondisplaced fracture of proximal phalanx of right great toe, initial encounter for closed fracture: Secondary | ICD-10-CM | POA: Insufficient documentation

## 2019-03-15 NOTE — Progress Notes (Deleted)
     Subjective:  Patient ID: Juan [name], male    DOB: 1991-05-18  Age: 28 y.o. MRN: [id number]  CC: No chief complaint on file.     HPI    Past Medical History:  Diagnosis Date  . Closed left subtrochanteric femur fracture ([place]) 01/06/2015  . Open fracture of right tibia 01/06/2015  . Open fracture of shaft of left tibia, type III 01/06/2015  . Seizures ([place])    x 1 at age 89      Family History  Problem Relation Age of Onset  . Diabetes Father   . Hypertension Mother   . Cancer Maternal Grandfather     Social History   Social History Narrative   ** Merged History Encounter **         No outpatient medications have been marked as taking for the 03/15/19 encounter (Appointment) with Ailene Ards, NP.    ROS:  ROS   Objective:   Today's Vitals: There were no vitals taken for this visit. Vitals with BMI 03/07/2019 07/31/2012 07/30/2012  Height - 5\' 4"  -  Weight - 140 lbs -  BMI - 25.9 -  Systolic [phone]  Diastolic 57 89 68  Pulse 78 83 69  Some encounter information is confidential and restricted. Go to Review Flowsheets activity to see all data.     Physical Exam       Assessment   1. Closed nondisplaced fracture of proximal phalanx of right great toe with routine healing, subsequent encounter       Tests ordered No orders of the defined types were placed in this encounter.    Plan: Please see assessment and plan per problem list below.   No orders of the defined types were placed in this encounter.   Patient to follow-up in ***.  Ailene Ards, NP

## 2019-03-16 ENCOUNTER — Ambulatory Visit: Payer: Medicare Other | Admitting: Gastroenterology

## 2019-03-17 ENCOUNTER — Encounter: Payer: Self-pay | Admitting: Nurse Practitioner

## 2019-03-17 ENCOUNTER — Encounter: Payer: Self-pay | Admitting: Internal Medicine

## 2019-03-17 ENCOUNTER — Other Ambulatory Visit: Payer: Self-pay

## 2019-03-17 ENCOUNTER — Ambulatory Visit (INDEPENDENT_AMBULATORY_CARE_PROVIDER_SITE_OTHER): Payer: Medicare Other | Admitting: Nurse Practitioner

## 2019-03-17 DIAGNOSIS — R197 Diarrhea, unspecified: Secondary | ICD-10-CM | POA: Diagnosis not present

## 2019-03-17 NOTE — Assessment & Plan Note (Signed)
The patient describes 4-year history of diarrhea since hospitalization after [place] [address] leading to prolonged hospitalization.  He required a PEG tube for about 3 to 4 months.  Ever since then he has had frequent diarrhea.  Has about 9 stools a day, 20% normal stools 80% diarrhea.  Varies from loose stools to watery stools.  Denies abdominal pain or nausea/vomiting association.  He does have thyroid disease but it sounds like his TSH is relatively within normal range, possibly a little elevated which would not likely explain her because the degree of diarrhea has had over the past 4 years.  At this point I will check CBC, CMP.  I will also tag celiac panel.  For completeness I will check a C. difficile and GI path panel although I doubt he has an infection vessel ongoing.  If his stool studies are normal we can try an antidiarrheal such as Bentyl to see if this helps.  I will also have him start probiotic.  Follow-up in 2 months, call for any worsening or severe symptoms.

## 2019-03-17 NOTE — Patient Instructions (Signed)
Your health issues we discussed today were:   Diarrhea: 1. Have your labs drawn when you are able to 2. Bring your stool studies back to the lab as soon as you can 3. Depending on your results we will call with further recommendations 4. Start taking a probiotic for at least the next 2 to 3 months.  It may take several weeks for this to start kicking in 5. Call us if you have any worsening or severe symptoms  Overall I recommend:  1. Continue your other current medications 2. Return for follow-up in 2 months 3. Call us again any questions or concerns.   Because of recent events of COVID-19 ("Coronavirus"), follow CDC recommendations:  1. Wash your hand frequently 2. Avoid touching your face 3. Stay away from people who are sick 4. If you have symptoms such as fever, cough, shortness of breath then call your healthcare provider for further guidance 5. If you are sick, STAY AT HOME unless otherwise directed by your healthcare provider. 6. Follow directions from state and national officials regarding staying safe    At [facility] we value your feedback. You may receive a survey about your visit today. Please share your experience as we strive to create trusting relationships with our patients to provide genuine, compassionate, quality care.  We appreciate your understanding and patience as we review any laboratory studies, imaging, and other diagnostic tests that are ordered as we care for you. Our office policy is 5 business days for review of these results, and any emergent or urgent results are addressed in a timely manner for your best interest. If you do not hear from our office in 1 week, please contact us.   We also encourage the use of MyChart, which contains your medical information for your review as well. If you are not enrolled in this feature, an access code is on this after visit summary for your convenience. Thank you for allowing [place] to be involved in your  care.  It was great to see you today!  I hope you have a great Fall and Happy Halloween!!

## 2019-03-17 NOTE — Progress Notes (Signed)
CC'ED TO PCP 
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
## 2019-04-11 ENCOUNTER — Telehealth (INDEPENDENT_AMBULATORY_CARE_PROVIDER_SITE_OTHER): Payer: Self-pay | Admitting: Nurse Practitioner

## 2019-04-11 NOTE — Telephone Encounter (Signed)
I called this patient to discuss scheduling him for follow-up as he did not make his last follow-up that was scheduled at this office.  He was agreeable to being rescheduled and he is now scheduled for a virtual office visit a week from today.

## 2019-04-18 ENCOUNTER — Telehealth (INDEPENDENT_AMBULATORY_CARE_PROVIDER_SITE_OTHER): Payer: Medicare Other | Admitting: Nurse Practitioner

## 2019-04-18 ENCOUNTER — Telehealth (INDEPENDENT_AMBULATORY_CARE_PROVIDER_SITE_OTHER): Payer: Self-pay | Admitting: Nurse Practitioner

## 2019-04-18 NOTE — Progress Notes (Signed)
Patient was a "no show" for this virtual visit. Disregard office visit.

## 2019-04-18 NOTE — Telephone Encounter (Signed)
I attempted to do a virtual visit with this patient at 11 AM today.  He did not check in on my chart nor did he answer his phone when I tried to call him to start the visit.  I did leave a message asking him to call this office back if he would like to complete this visit and/or to reschedule if necessary.

## 2019-04-19 ENCOUNTER — Telehealth (INDEPENDENT_AMBULATORY_CARE_PROVIDER_SITE_OTHER): Payer: Self-pay | Admitting: Nurse Practitioner

## 2019-04-19 NOTE — Telephone Encounter (Signed)
I attempted to call this patient to reschedule his no-show.  He did not answer the phone but I did leave a voicemail explaining to him that we would like to reschedule this missed appointment.  I did also tell him that it is our policy that if the patient has 3 no-shows, the patient may be discharged from the practice.  We will await his call back to reschedule this appointment.

## 2019-04-29 ENCOUNTER — Other Ambulatory Visit (INDEPENDENT_AMBULATORY_CARE_PROVIDER_SITE_OTHER): Payer: Self-pay | Admitting: Internal Medicine

## 2019-04-29 ENCOUNTER — Other Ambulatory Visit: Payer: Self-pay

## 2019-04-29 DIAGNOSIS — Z20828 Contact with and (suspected) exposure to other viral communicable diseases: Secondary | ICD-10-CM | POA: Diagnosis not present

## 2019-04-29 DIAGNOSIS — Z20822 Contact with and (suspected) exposure to covid-19: Secondary | ICD-10-CM

## 2019-04-30 LAB — NOVEL CORONAVIRUS, NAA: SARS-CoV-2, NAA: NOT DETECTED

## 2019-05-11 ENCOUNTER — Other Ambulatory Visit (INDEPENDENT_AMBULATORY_CARE_PROVIDER_SITE_OTHER): Payer: Self-pay | Admitting: Internal Medicine

## 2019-05-11 ENCOUNTER — Encounter (INDEPENDENT_AMBULATORY_CARE_PROVIDER_SITE_OTHER): Payer: Self-pay | Admitting: Internal Medicine

## 2019-05-11 MED ORDER — LORATADINE 10 MG PO TABS: 10.0000 mg | ORAL_TABLET | Freq: Every day | ORAL | 3 refills | Status: DC

## 2019-05-18 ENCOUNTER — Ambulatory Visit: Payer: Medicare Other | Admitting: Nurse Practitioner

## 2019-05-18 ENCOUNTER — Encounter: Payer: Self-pay | Admitting: Internal Medicine

## 2019-05-18 ENCOUNTER — Telehealth: Payer: Self-pay | Admitting: Internal Medicine
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
## 2019-05-18 NOTE — Telephone Encounter (Signed)
Patient was a no show and letter sent  °

## 2019-05-26 ENCOUNTER — Encounter (INDEPENDENT_AMBULATORY_CARE_PROVIDER_SITE_OTHER): Payer: Self-pay | Admitting: Internal Medicine

## 2019-05-30 DIAGNOSIS — F524 Premature ejaculation: Secondary | ICD-10-CM | POA: Diagnosis not present

## 2019-06-06 ENCOUNTER — Ambulatory Visit (INDEPENDENT_AMBULATORY_CARE_PROVIDER_SITE_OTHER): Payer: Medicare Other | Admitting: Internal Medicine

## 2019-06-13 ENCOUNTER — Ambulatory Visit (INDEPENDENT_AMBULATORY_CARE_PROVIDER_SITE_OTHER): Payer: Medicare Other | Admitting: Internal Medicine

## 2019-06-13 ENCOUNTER — Encounter (INDEPENDENT_AMBULATORY_CARE_PROVIDER_SITE_OTHER): Payer: Self-pay | Admitting: Internal Medicine

## 2019-06-13 ENCOUNTER — Other Ambulatory Visit: Payer: Self-pay

## 2019-06-13 VITALS — BP 110/70 | HR 72 | Ht 67.0 in | Wt 191.8 lb

## 2019-06-13 DIAGNOSIS — R358 Other polyuria: Secondary | ICD-10-CM | POA: Diagnosis not present

## 2019-06-13 DIAGNOSIS — R5381 Other malaise: Secondary | ICD-10-CM

## 2019-06-13 DIAGNOSIS — R5383 Other fatigue: Secondary | ICD-10-CM | POA: Diagnosis not present

## 2019-06-13 DIAGNOSIS — R3589 Other polyuria: Secondary | ICD-10-CM

## 2019-06-13 DIAGNOSIS — E559 Vitamin D deficiency, unspecified: Secondary | ICD-10-CM | POA: Diagnosis not present

## 2019-06-13 LAB — COMPLETE METABOLIC PANEL WITH GFR
AG Ratio: 1.9 (calc) (ref 1.0–2.5)
ALT: 13 U/L (ref 9–46)
AST: 14 U/L (ref 10–40)
Albumin: 4.2 g/dL (ref 3.6–5.1)
Alkaline phosphatase (APISO): 71 U/L (ref 36–130)
BUN: 15 mg/dL (ref 7–25)
CO2: 28 mmol/L (ref 20–32)
Calcium: 9.4 mg/dL (ref 8.6–10.3)
Chloride: 107 mmol/L (ref 98–110)
Creat: 0.82 mg/dL (ref 0.60–1.35)
GFR, Est African American: 139 mL/min/{1.73_m2} (ref >=60)
GFR, Est Non African American: 120 mL/min/{1.73_m2} (ref >=60)
Globulin: 2.2 g/dL (calc) (ref 1.9–3.7)
Glucose, Bld: 90 mg/dL (ref 65–99)
Potassium: 4.3 mmol/L (ref 3.5–5.3)
Sodium: 140 mmol/L (ref 135–146)
Total Bilirubin: 0.3 mg/dL (ref 0.2–1.2)
Total Protein: 6.4 g/dL (ref 6.1–8.1)

## 2019-06-13 LAB — T3, FREE: T3, Free: 3.5 pg/mL (ref 2.3–4.2)

## 2019-06-13 LAB — VITAMIN D 25 HYDROXY (VIT D DEFICIENCY, FRACTURES): Vit D, 25-Hydroxy: 36 ng/mL (ref 30–100)

## 2019-06-13 LAB — TSH: TSH: 1.41 mIU/L (ref 0.40–4.50)
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
## 2019-06-20 NOTE — Progress Notes (Signed)
Called and  notified pt of mychart has his message and resutls of blood work.left a voicemail on mobile phone.

## 2019-06-29 ENCOUNTER — Other Ambulatory Visit (INDEPENDENT_AMBULATORY_CARE_PROVIDER_SITE_OTHER): Payer: Self-pay | Admitting: Internal Medicine

## 2019-06-29 ENCOUNTER — Encounter (INDEPENDENT_AMBULATORY_CARE_PROVIDER_SITE_OTHER): Payer: Self-pay | Admitting: Internal Medicine

## 2019-06-29 MED ORDER — NP THYROID 90 MG PO TABS: 90.0000 mg | ORAL_TABLET | Freq: Every day | ORAL | 3 refills | Status: DC

## 2019-08-27 ENCOUNTER — Other Ambulatory Visit: Payer: Self-pay | Admitting: Physical Medicine & Rehabilitation

## 2019-08-27 DIAGNOSIS — Z87820 Personal history of traumatic brain injury: Secondary | ICD-10-CM

## 2019-08-27 DIAGNOSIS — I69319 Unspecified symptoms and signs involving cognitive functions following cerebral infarction: Secondary | ICD-10-CM

## 2019-09-13 ENCOUNTER — Ambulatory Visit (INDEPENDENT_AMBULATORY_CARE_PROVIDER_SITE_OTHER): Payer: Medicare Other | Admitting: Nurse Practitioner

## 2019-09-21 ENCOUNTER — Encounter (INDEPENDENT_AMBULATORY_CARE_PROVIDER_SITE_OTHER): Payer: Self-pay | Admitting: Internal Medicine

## 2019-09-21 ENCOUNTER — Ambulatory Visit (INDEPENDENT_AMBULATORY_CARE_PROVIDER_SITE_OTHER): Payer: Medicare Other | Admitting: Nurse Practitioner

## 2019-09-21 DIAGNOSIS — X58XXXA Exposure to other specified factors, initial encounter: Secondary | ICD-10-CM | POA: Diagnosis not present

## 2019-09-21 DIAGNOSIS — S46812A Strain of other muscles, fascia and tendons at shoulder and upper arm level, left arm, initial encounter: Secondary | ICD-10-CM | POA: Diagnosis not present

## 2019-09-22 ENCOUNTER — Encounter (INDEPENDENT_AMBULATORY_CARE_PROVIDER_SITE_OTHER): Payer: Self-pay | Admitting: Nurse Practitioner

## 2019-09-22 ENCOUNTER — Telehealth (INDEPENDENT_AMBULATORY_CARE_PROVIDER_SITE_OTHER): Payer: Self-pay | Admitting: Nurse Practitioner

## 2019-09-22 NOTE — Telephone Encounter (Signed)
I left him a detailed message stating that he is scheduled Monday May 10th 2021 at 9:40 am

## 2019-09-22 NOTE — Telephone Encounter (Signed)
Benedetto Goad, will you call this patient and try to work him in to the schedule with Dr. Karilyn Cota today if possible.  If not, see if you can get him on the schedule sometime next week to evaluate his shoulder.  I did send him a message in his MyChart regarding this, so as long as he is read the message he should be expecting your call.  Thank you.

## 2019-09-26 ENCOUNTER — Ambulatory Visit (INDEPENDENT_AMBULATORY_CARE_PROVIDER_SITE_OTHER): Payer: Medicare Other | Admitting: Nurse Practitioner

## 2019-10-02 ENCOUNTER — Encounter (INDEPENDENT_AMBULATORY_CARE_PROVIDER_SITE_OTHER): Payer: Self-pay | Admitting: Internal Medicine

## 2019-10-02 DIAGNOSIS — M542 Cervicalgia: Secondary | ICD-10-CM | POA: Diagnosis not present

## 2019-10-03 ENCOUNTER — Ambulatory Visit (INDEPENDENT_AMBULATORY_CARE_PROVIDER_SITE_OTHER): Payer: Medicare Other

## 2019-10-03 ENCOUNTER — Other Ambulatory Visit: Payer: Self-pay

## 2019-10-03 ENCOUNTER — Ambulatory Visit
Admission: EM | Admit: 2019-10-03 | Discharge: 2019-10-03 | Disposition: A | Discharge status: Home / Self Care | Payer: Medicare Other | Attending: Emergency Medicine | Admitting: Emergency Medicine

## 2019-10-03 DIAGNOSIS — M542 Cervicalgia: Secondary | ICD-10-CM | POA: Diagnosis not present
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
## 2019-10-03 NOTE — Discharge Instructions (Addendum)
Follow-up with PCP Take prednisone and Robaxin  that was prescribed that was prescribed at [facility] ED May take OTC Tylenol as needed for pain Return or go to ED for worsening symptoms

## 2019-10-03 NOTE — ED Triage Notes (Signed)
Pt c/o L sided neck pain radiating into L shoulder. Was seen in ER last night, given a script for prednisone and robaxin, has not picked up from pharmacy yet. Was given injection of steroid at ER, states not better this AM. Pt also taking ibuprofen at home.

## 2019-10-04 ENCOUNTER — Ambulatory Visit (INDEPENDENT_AMBULATORY_CARE_PROVIDER_SITE_OTHER): Payer: Medicare Other | Admitting: Internal Medicine

## 2019-10-04 ENCOUNTER — Other Ambulatory Visit (INDEPENDENT_AMBULATORY_CARE_PROVIDER_SITE_OTHER): Payer: Self-pay | Admitting: Internal Medicine

## 2019-10-04 ENCOUNTER — Encounter (INDEPENDENT_AMBULATORY_CARE_PROVIDER_SITE_OTHER): Payer: Self-pay | Admitting: Internal Medicine

## 2019-10-04 VITALS — BP 120/85 | HR 106 | Temp 97.9°F | Ht 67.0 in | Wt 194.2 lb

## 2019-10-04 DIAGNOSIS — M542 Cervicalgia: Secondary | ICD-10-CM

## 2019-10-04 MED ORDER — CYCLOBENZAPRINE HCL 5 MG PO TABS: 5.0000 mg | ORAL_TABLET | Freq: Three times a day (TID) | ORAL | 1 refills | Status: DC | PRN

## 2019-10-04 NOTE — Progress Notes (Signed)
Metrics: Intervention Frequency ACO  Documented Smoking Status Yearly  Screened one or more times in 24 months  Cessation Counseling or  Active cessation medication Past 24 months  Past 24 months   Guideline developer: UpToDate (See UpToDate for funding source) Date Released: 2014       Wellness Office Visit  Subjective:  Patient ID: Juan [name], male    DOB: Apr 05, 1991  Age: 29 y.o. MRN: [id number]  CC: Neck pain HPI  This man comes in for an acute visit for the above symptoms which he has had for the last 3 weeks or so.  He had gone to the emergency room in [place] and was prescribed oral steroids.  He then went to an urgent care center in [place] and he had a neck x-ray which showed some degenerative joint disease but no other major abnormalities.  He continues to get left-sided neck pain radiating to the upper left trapezius area.  He does not have any weakness in his arms.  He is able to walk without any problems. Past Medical History:  Diagnosis Date  . Closed left subtrochanteric femur fracture (HCC) 01/06/2015  . Open fracture of right tibia 01/06/2015  . Open fracture of shaft of left tibia, type III 01/06/2015  . Seizures (HCC)    x 1 at age 4      Family History  Problem Relation Age of Onset  . Diabetes Father   . Hypertension Mother   . Cancer Maternal Grandfather   . Colon cancer Neg Hx   . Gastric cancer Neg Hx   . Esophageal cancer Neg Hx     Social History   Social History Narrative    Lives with wife,married on August 2020-been together 7 years.Lives with wife and 1 son.Works for [employer].   Social History   Tobacco Use  . Smoking status: Former Smoker    Types: Cigarettes  . Smokeless tobacco: Former Neurosurgeon  . Tobacco comment: socially smokes now, typically 3 times a month "only when I'm drinking"  Substance Use Topics  . Alcohol use: Yes    Alcohol/week: 12.0 standard drinks    Types: 12 Cans of beer per week    Current Meds   Medication Sig  . Cholecalciferol (VITAMIN D-3) 125 MCG (5000 UT) TABS Take 1 tablet by mouth daily.   Marland Kitchen loratadine (CLARITIN) 10 MG tablet Take 1 tablet (10 mg total) by mouth daily.  . meloxicam (MOBIC) 15 MG tablet TAKE 1 TABLET(15 MG) BY MOUTH DAILY AS NEEDED FOR PAIN  . methylPREDNISolone (MEDROL DOSEPAK) 4 MG TBPK tablet Take 4 mg by mouth.  . NP THYROID 90 MG tablet Take 1 tablet (90 mg total) by mouth daily.  . predniSONE (DELTASONE) 20 MG tablet Take 20 mg by mouth in the morning and at bedtime.       Objective:   Today's Vitals: BP 120/85 (BP Location: Right Arm, Patient Position: Sitting, Cuff Size: Normal)   Pulse (!) 106   Temp 97.9 F (36.6 C) (Temporal)   Ht 5\' 7"  (1.702 m)   Wt 194 lb 3.2 oz (88.1 kg)   SpO2 99%   BMI 30.42 kg/m  Vitals with BMI 10/04/2019 10/03/2019 06/13/2019  Height 5\' 7"  - 5\' 7"   Weight 194 lbs 3 oz - 191 lbs 13 oz  BMI 30.41 - 30.03  Systolic 120 137 06/15/2019  Diastolic 85 80 70  Pulse 106 106 72  Some encounter information is confidential and restricted. Go to Review  Flowsheets activity to see all data.     Physical Exam  Examination does not reveal any neurological abnormalities.  He does seem to have a spasm in the left trapezius area and one particular area which reproduces the pain.     Assessment   1. Neck pain       Tests ordered No orders of the defined types were placed in this encounter.    Plan: 1. I think the pain is associated with spasm and muscular sprain.  He should continue with oral steroids he has already been given and I am going to prescribe for him cyclobenzaprine as a muscle relaxant.   I explained possible side effects from this.  If he does not improve, he will let me know. 2. Follow-up in 4 weeks.  I have counseled him regarding his several no-shows that he has had in the last several months and that if he does not keep the next appointment, is likely to be dismissed from the practice   Meds ordered  this encounter  Medications  . cyclobenzaprine (FLEXERIL) 5 MG tablet    Sig: Take 1 tablet (5 mg total) by mouth 3 (three) times daily as needed for muscle spasms.    Dispense:  30 tablet    Refill:  1    Trayquan Kolakowski Luther Parody, MD

## 2019-10-05 ENCOUNTER — Other Ambulatory Visit: Payer: Self-pay

## 2019-10-05 ENCOUNTER — Telehealth: Payer: Self-pay | Admitting: Internal Medicine

## 2019-10-05 DIAGNOSIS — R197 Diarrhea, unspecified: Secondary | ICD-10-CM

## 2019-10-05 DIAGNOSIS — Z79899 Other long term (current) drug therapy: Secondary | ICD-10-CM

## 2019-10-05 NOTE — Telephone Encounter (Signed)
Pt was seen in 02/2019 and No Show in 04/2019 for his follow up. His wife called today saying that he messed up his stool sample that we had ordered and needed another one. She said his stool was black and goes multiple times a day. I made him an OV in next available (6/23) and put him on a cancellation list. Wife would like to know if he needs to do the stool sample prior to OV. Please advise. [phone]

## 2019-10-05 NOTE — Telephone Encounter (Signed)
Spoke with pt and spouse. New lab orders were placed for stool study since the last orders were placed 6 months ago.

## 2019-10-12 DIAGNOSIS — Z20828 Contact with and (suspected) exposure to other viral communicable diseases: Secondary | ICD-10-CM | POA: Diagnosis not present

## 2019-10-24 DIAGNOSIS — R197 Diarrhea, unspecified: Secondary | ICD-10-CM | POA: Diagnosis not present

## 2019-10-24 DIAGNOSIS — Z79899 Other long term (current) drug therapy: Secondary | ICD-10-CM | POA: Diagnosis not present

## 2019-10-26 LAB — GASTROINTESTINAL PATHOGEN PANEL PCR
C. difficile Tox A/B, PCR: NOT DETECTED
Campylobacter, PCR: NOT DETECTED
Cryptosporidium, PCR: NOT DETECTED
E coli (ETEC) LT/ST PCR: NOT DETECTED
E coli (STEC) stx1/stx2, PCR: NOT DETECTED
E coli 0157, PCR: NOT DETECTED
Giardia lamblia, PCR: NOT DETECTED
Norovirus, PCR: NOT DETECTED
Rotavirus A, PCR: NOT DETECTED
Salmonella, PCR: NOT DETECTED
Shigella, PCR: NOT DETECTED

## 2019-11-08 ENCOUNTER — Encounter (INDEPENDENT_AMBULATORY_CARE_PROVIDER_SITE_OTHER): Payer: Self-pay | Admitting: Internal Medicine

## 2019-11-08 ENCOUNTER — Ambulatory Visit (INDEPENDENT_AMBULATORY_CARE_PROVIDER_SITE_OTHER): Payer: Medicare Other | Admitting: Internal Medicine

## 2019-11-08 ENCOUNTER — Other Ambulatory Visit: Payer: Self-pay

## 2019-11-08 VITALS — BP 130/80 | HR 89 | Temp 97.9°F | Resp 18 | Ht 66.0 in | Wt 191.0 lb

## 2019-11-08 DIAGNOSIS — E559 Vitamin D deficiency, unspecified: Secondary | ICD-10-CM

## 2019-11-08 DIAGNOSIS — R5381 Other malaise: Secondary | ICD-10-CM | POA: Diagnosis not present

## 2019-11-08 DIAGNOSIS — R5383 Other fatigue: Secondary | ICD-10-CM

## 2019-11-08 MED ORDER — NP THYROID 90 MG PO TABS: 90.0000 mg | ORAL_TABLET | Freq: Every day | ORAL | 3 refills | Status: DC

## 2019-11-08 NOTE — Progress Notes (Signed)
Metrics: Intervention Frequency ACO  Documented Smoking Status Yearly  Screened one or more times in 24 months  Cessation Counseling or  Active cessation medication Past 24 months  Past 24 months   Guideline developer: UpToDate (See UpToDate for funding source) Date Released: 2014       Wellness Office Visit  Subjective:  Patient ID: Juan [name], male    DOB: 05-14-1991  Age: 29 y.o. MRN: [id number]  CC: This man comes in for follow-up of symptoms of thyroid deficiency, vitamin D deficiency, recent history of neck pain. HPI  He says that his neck pain is improved since he changed jobs and went back to [employer]. He was having a hard time walking on the hard floor at the previous job. He has been taking NP thyroid but not on a regular basis I think. He has no other specific complaints today. Past Medical History:  Diagnosis Date  . Closed left subtrochanteric femur fracture ([place]) 01/06/2015  . Open fracture of right tibia 01/06/2015  . Open fracture of shaft of left tibia, type III 01/06/2015  . Seizures ([place])    x 1 at age 57   Past Surgical History:  Procedure Laterality Date  . ESOPHAGOGASTRODUODENOSCOPY (EGD) WITH PROPOFOL N/A 01/18/2015   Procedure: ESOPHAGOGASTRODUODENOSCOPY (EGD) WITH PROPOFOL;  Surgeon: Georganna Skeans, MD;  Location: [place];  Service: General;  Laterality: N/A;  . FEMUR IM NAIL Bilateral 01/06/2015   Procedure: IRRIGATION AND DEBRIDEMENT BILATERAL LEGS WITH APPLICATION EXTERNAL FIXATOR RIGHT  TIBIA AND APPLICATION EXTERNAL FIXATORS TO LEFT FEMUR  AND LEFT TIBIA ;  Surgeon: Marchia Bond, MD;  Location: [place];  Service: Orthopedics;  Laterality: Bilateral;  . FEMUR IM NAIL Left 01/15/2015   Procedure: INTRAMEDULLARY (IM) RETROGRADE FEMORAL NAILING;  Surgeon: Altamese Cooper, MD;  Location: [place];  Service: Orthopedics;  Laterality: Left;  . I & D EXTREMITY Bilateral 01/15/2015   Procedure: IRRIGATION AND DEBRIDEMENT BILATERAL EXTREMITY;  Surgeon: Altamese [place],  MD;  Location: [place];  Service: Orthopedics;  Laterality: Bilateral;  . ORIF PELVIC FRACTURE Bilateral 01/15/2015   Procedure: orif pelvis bilateral iliac screws percantaneous fixation left tavern, im nail bilateral tibia, retrograde im nail left femur, removal of external fixation ;  Surgeon: Altamese [place], MD;  Location: [place];  Service: Orthopedics;  Laterality: Bilateral;  . PEG PLACEMENT N/A 01/18/2015   Procedure: PERCUTANEOUS ENDOSCOPIC GASTROSTOMY (PEG) PLACEMENT;  Surgeon: Georganna Skeans, MD;  Location: [place];  Service: General;  Laterality: N/A;  bedside  . PERCUTANEOUS TRACHEOSTOMY N/A 01/18/2015   Procedure: PERCUTANEOUS TRACHEOSTOMY (BEDSIDE);  Surgeon: Georganna Skeans, MD;  Location: [place];  Service: General;  Laterality: N/A;  . TIBIA IM NAIL INSERTION Bilateral 01/15/2015   Procedure: INTRAMEDULLARY (IM) NAIL TIBIAL;  Surgeon: Altamese [place], MD;  Location: [place];  Service: Orthopedics;  Laterality: Bilateral;     Family History  Problem Relation Age of Onset  . Diabetes Father   . Hypertension Mother   . Cancer Maternal Grandfather   . Colon cancer Neg Hx   . Gastric cancer Neg Hx   . Esophageal cancer Neg Hx     Social History   Social History Narrative    Lives with wife,married on August 2020-been together 7 years.Lives with wife and 1 son.Works for [employer] .   Social History   Tobacco Use  . Smoking status: Former Smoker    Types: Cigarettes  . Smokeless tobacco: Former Systems developer  . Tobacco comment: socially smokes now, typically 3 times a month "only  when I'm drinking"  Substance Use Topics  . Alcohol use: Yes    Alcohol/week: 12.0 standard drinks    Types: 12 Cans of beer per week    Current Meds  Medication Sig  . Cholecalciferol (VITAMIN D-3) 125 MCG (5000 UT) TABS Take 2 tablets by mouth daily.   . cyclobenzaprine (FLEXERIL) 5 MG tablet Take 1 tablet (5 mg total) by mouth 3 (three) times daily as needed for muscle spasms.  Marland Kitchen loratadine (CLARITIN) 10 MG  tablet Take 1 tablet (10 mg total) by mouth daily.  . NP THYROID 90 MG tablet Take 1 tablet (90 mg total) by mouth daily.  . [DISCONTINUED] meloxicam (MOBIC) 15 MG tablet TAKE 1 TABLET(15 MG) BY MOUTH DAILY AS NEEDED FOR PAIN  . [DISCONTINUED] methylPREDNISolone (MEDROL DOSEPAK) 4 MG TBPK tablet Take 4 mg by mouth.  . [DISCONTINUED] NP THYROID 90 MG tablet Take 1 tablet (90 mg total) by mouth daily.  . [DISCONTINUED] predniSONE (DELTASONE) 20 MG tablet Take 20 mg by mouth in the morning and at bedtime.      Depression screen PHQ 2/9 10/04/2019  Decreased Interest 0  Down, Depressed, Hopeless 0  PHQ - 2 Score 0  Some encounter information is confidential and restricted. Go to Review Flowsheets activity to see all data.     Objective:   Today's Vitals: BP 130/80 (BP Location: Right Arm, Patient Position: Sitting, Cuff Size: Normal)   Pulse 89   Temp 97.9 F (36.6 C) (Temporal)   Resp 18   Ht 5\' 6"  (1.676 m)   Wt 191 lb (86.6 kg)   SpO2 98%   BMI 30.83 kg/m  Vitals with BMI 11/08/2019 10/04/2019 10/03/2019  Height 5\' 6"  5\' 7"  -  Weight 191 lbs 194 lbs 3 oz -  BMI 30.84 30.41 -  Systolic 130 120 10/05/2019  Diastolic 80 85 80  Pulse 89 106 106  Some encounter information is confidential and restricted. Go to Review Flowsheets activity to see all data.     Physical Exam   He looks systemically well. His weight is stable. Blood pressure stable. No other new physical findings today.    Assessment   1. Malaise and fatigue   2. Vitamin D deficiency disease       Tests ordered No orders of the defined types were placed in this encounter.    Plan: 1. I refilled his NP thyroid today, this is being used off label, for symptoms of thyroid deficiency. 2. He will continue with vitamin D 3 supplementation for vitamin D deficiency. 3. He will follow-up with in about 3 months for an annual Medicare wellness visit.   Meds ordered this encounter  Medications  . NP THYROID 90  MG tablet    Sig: Take 1 tablet (90 mg total) by mouth daily.    Dispense:  30 tablet    Refill:  3    Taralynn Quiett , MD

## 2019-11-09 ENCOUNTER — Encounter: Payer: Self-pay | Admitting: Gastroenterology

## 2019-11-09 ENCOUNTER — Ambulatory Visit (INDEPENDENT_AMBULATORY_CARE_PROVIDER_SITE_OTHER): Payer: Medicare Other | Admitting: Gastroenterology

## 2019-11-09 VITALS — BP 131/84 | HR 79 | Temp 97.0°F | Ht 67.0 in | Wt 187.8 lb

## 2019-11-09 DIAGNOSIS — R197 Diarrhea, unspecified: Secondary | ICD-10-CM

## 2019-11-09 MED ORDER — DICYCLOMINE HCL 10 MG PO CAPS: 10.0000 mg | ORAL_CAPSULE | Freq: Three times a day (TID) | ORAL | 3 refills | Status: DC

## 2019-11-09 NOTE — Progress Notes (Signed)
Referring Provider: Wilson Singer, MD Primary Care Physician:  Wilson Singer, MD Primary GI: Dr. Jena Gauss   Chief Complaint  Patient presents with  . Diarrhea    ongoing issue since 2016. No matter what he eats. Stools are dark, no blood    HPI:   Juan [name] is a 29 y.o. male presenting today with a history of chronic diarrhea dating back to hospitalization 5 years ago. GI pathogen panel negative June 2021. Cdiff not completed. Celiac panel not completed as requested.   No abdominal pain. Rare cramping, a lot of noises.  Stools range in frequency. Postprandial. Sometimes as much as 12 per day. Sometimes watery, sometimes soft. Rarely hard. Appetite is good. Coffee makes symptoms worse, otherwise no issues. No hematochezia. Stool is dark after coffee. No weight loss. Tried to change diet without improvement. No prior colonoscopy.   Past Medical History:  Diagnosis Date  . Closed left subtrochanteric femur fracture (HCC) 01/06/2015  . Open fracture of right tibia 01/06/2015  . Open fracture of shaft of left tibia, type III 01/06/2015  . Seizures (HCC)    x 1 at age 1    Past Surgical History:  Procedure Laterality Date  . ESOPHAGOGASTRODUODENOSCOPY (EGD) WITH PROPOFOL N/A 01/18/2015   Procedure: ESOPHAGOGASTRODUODENOSCOPY (EGD) WITH PROPOFOL;  Surgeon: Violeta Gelinas, MD;  Location: [facility] ENDOSCOPY;  Service: General;  Laterality: N/A;  . FEMUR IM NAIL Bilateral 01/06/2015   Procedure: IRRIGATION AND DEBRIDEMENT BILATERAL LEGS WITH APPLICATION EXTERNAL FIXATOR RIGHT  TIBIA AND APPLICATION EXTERNAL FIXATORS TO LEFT FEMUR  AND LEFT TIBIA ;  Surgeon: Teryl Lucy, MD;  Location: [facility] OR;  Service: Orthopedics;  Laterality: Bilateral;  . FEMUR IM NAIL Left 01/15/2015   Procedure: INTRAMEDULLARY (IM) RETROGRADE FEMORAL NAILING;  Surgeon: Myrene Galas, MD;  Location: [facility] OR;  Service: Orthopedics;  Laterality: Left;  . I & D EXTREMITY Bilateral 01/15/2015   Procedure: IRRIGATION AND  DEBRIDEMENT BILATERAL EXTREMITY;  Surgeon: Myrene Galas, MD;  Location[facility] OR;  Service: Orthopedics;  Laterality: Bilateral;  . ORIF PELVIC FRACTURE Bilateral 01/15/2015   Procedure: orif pelvis bilateral iliac screws percantaneous fixation left tavern, im nail bilateral tibia, retrograde im nail left femur, removal of external fixation ;  Surgeon: Myrene Galas, MD;  Location: [facility] OR;  Service: Orthopedics;  Laterality: Bilateral;  . PEG PLACEMENT N/A 01/18/2015   Procedure: PERCUTANEOUS ENDOSCOPIC GASTROSTOMY (PEG) PLACEMENT;  Surgeon: Violeta Gelinas, MD;  Location: [facility] ENDOSCOPY;  Service: General;  Laterality: N/A;  bedside  . PERCUTANEOUS TRACHEOSTOMY N/A 01/18/2015   Procedure: PERCUTANEOUS TRACHEOSTOMY (BEDSIDE);  Surgeon: Violeta Gelinas, MD;  Location: [facility] OR;  Service: General;  Laterality: N/A;  . TIBIA IM NAIL INSERTION Bilateral 01/15/2015   Procedure: INTRAMEDULLARY (IM) NAIL TIBIAL;  Surgeon: Myrene Galas, MD;  Location: [facility] OR;  Service: Orthopedics;  Laterality: Bilateral;    Current Outpatient Medications  Medication Sig Dispense Refill  . Cholecalciferol (VITAMIN D-3) 125 MCG (5000 UT) TABS Take 2 tablets by mouth daily.     . cyclobenzaprine (FLEXERIL) 5 MG tablet Take 1 tablet (5 mg total) by mouth 3 (three) times daily as needed for muscle spasms. 30 tablet 1  . loratadine (CLARITIN) 10 MG tablet Take 1 tablet (10 mg total) by mouth daily. (Patient taking differently: Take 10 mg by mouth daily. As needed) 30 tablet 3  . NP THYROID 90 MG tablet Take 1 tablet (90 mg total) by mouth daily. 30 tablet 3  . dicyclomine (BENTYL) 10  MG capsule Take 1 capsule (10 mg total) by mouth 4 (four) times daily -  before meals and at bedtime. 120 capsule 3   No current facility-administered medications for this visit.    Allergies as of 11/09/2019  . (No Known Allergies)    Family History  Problem Relation Age of Onset  . Diabetes Father   . Hypertension Mother   . Cancer Maternal  Grandfather   . Colon cancer Neg Hx   . Gastric cancer Neg Hx   . Esophageal cancer Neg Hx     Social History   Socioeconomic History  . Marital status: Single    Spouse name: Not on file  . Number of children: 1  . Years of education: Not on file  . Highest education level: Not on file  Occupational History  . Occupation: Unemployed  Tobacco Use  . Smoking status: Former Smoker    Types: Cigarettes  . Smokeless tobacco: Former Neurosurgeon  . Tobacco comment: socially smokes now, typically 3 times a month "only when I'm drinking"  Substance and Sexual Activity  . Alcohol use: Yes    Alcohol/week: 12.0 standard drinks    Types: 12 Cans of beer per week  . Drug use: No    Types: Marijuana    Comment: last used 07/25/12  . Sexual activity: Yes  Other Topics Concern  . Not on file  Social History Narrative    Lives with wife,married on August 2020-been together 7 years.Lives with wife and 1 son.Works for [employer] .   Social Determinants of Health   Financial Resource Strain:   . Difficulty of Paying Living Expenses:   Food Insecurity:   . Worried About Programme researcher, broadcasting/film/video in the Last Year:   . Barista in the Last Year:   Transportation Needs:   . Freight forwarder (Medical):   Marland Kitchen Lack of Transportation (Non-Medical):   Physical Activity:   . Days of Exercise per Week:   . Minutes of Exercise per Session:   Stress:   . Feeling of Stress :   Social Connections:   . Frequency of Communication with Friends and Family:   . Frequency of Social Gatherings with Friends and Family:   . Attends Religious Services:   . Active Member of Clubs or Organizations:   . Attends Banker Meetings:   Marland Kitchen Marital Status:     Review of Systems: Gen: Denies fever, chills, anorexia. Denies fatigue, weakness, weight loss.  CV: Denies chest pain, palpitations, syncope, peripheral edema, and claudication. Resp: Denies dyspnea at rest, cough, wheezing, coughing up blood, and  pleurisy. GI: see HPI Derm: Denies rash, itching, dry skin Psych: Denies depression, anxiety, memory loss, confusion. No homicidal or suicidal ideation.  Heme: Denies bruising, bleeding, and enlarged lymph nodes.  Physical Exam: BP 131/84   Pulse 79   Temp (!) 97 F (36.1 C)   Ht 5\' 7"  (1.702 m)   Wt 187 lb 12.8 oz (85.2 kg)   BMI 29.41 kg/m  General:   Alert and oriented. No distress noted. Pleasant and cooperative.  Head:  Normocephalic and atraumatic. Eyes:  Conjuctiva clear without scleral icterus. Mouth:  Mask in place Abdomen:  +BS, soft, non-tender and non-distended. No rebound or guarding. No HSM or masses noted. Msk:  Symmetrical without gross deformities. Normal posture. Extremities:  Without edema. Neurologic:  Alert and  oriented x4 Psych:  Alert and cooperative. Normal mood and affect.  ASSESSMENT: Juan [name] is  a 29 y.o. male presenting today with a 5-year-long history of chronic diarrhea, postprandial, without overt GI bleeding, abdominal pain, or weight loss. Symptom onset after prolonged hospitalization 5 years ago. GI pathogen panel negative recently. Cdiff was requested but sample was not processed by lab as was not refrigerated.   As he has had diarrhea for 5 years, will hold off on obtaining Cdiff testing. TSH normal several months ago. Symptoms at baseline for patient. We will order celiac serologies as previously requested. I've recommended dicyclomine to start, and I anticipate he will need an ileocolonoscopy in future if no improvement. He is to call with an update after starting dicyclomine.    PLAN:   CBC, celiac serologies today  Trial of Bentyl QID  Call with progress report  Colonoscopy if no improvement  Return in 3 months   Annitta Needs, PhD, [facility] Gastroenterology

## 2019-11-09 NOTE — Patient Instructions (Signed)
Please have blood work completed.  I sent in dicyclomine (Bentyl) to take before meals and at bedtime. Monitor for constipation, dry mouth, dizziness, confusion. Let me know on MyChart how this is working for you!  If no improvement, we will need to do a colonoscopy.  We will see you in 3 months!  It was a pleasure to see you today. I want to create trusting relationships with patients to provide genuine, compassionate, and quality care. I value your feedback. If you receive a survey regarding your visit,  I greatly appreciate you taking time to fill this out.   Gelene Mink, PhD, ANP-BC [facility] Gastroenterology

## 2019-11-10 NOTE — Progress Notes (Signed)
Cc'ed to pcp °

## 2019-11-23 DIAGNOSIS — Z79899 Other long term (current) drug therapy: Secondary | ICD-10-CM | POA: Diagnosis not present

## 2019-11-23 DIAGNOSIS — R197 Diarrhea, unspecified: Secondary | ICD-10-CM | POA: Diagnosis not present

## 2019-11-24 LAB — C. DIFFICILE GDH AND TOXIN A/B
GDH ANTIGEN: NOT DETECTED
MICRO NUMBER:: 10676921
SPECIMEN QUALITY:: ADEQUATE
TOXIN A AND B: NOT DETECTED

## 2020-01-25 ENCOUNTER — Encounter (INDEPENDENT_AMBULATORY_CARE_PROVIDER_SITE_OTHER): Payer: Self-pay | Admitting: Internal Medicine

## 2020-01-26 ENCOUNTER — Other Ambulatory Visit (INDEPENDENT_AMBULATORY_CARE_PROVIDER_SITE_OTHER): Payer: Self-pay | Admitting: Internal Medicine

## 2020-01-26 MED ORDER — SILVER SULFADIAZINE 1 % EX CREA
1.0000 | TOPICAL_CREAM | Freq: Every day | CUTANEOUS | 0 refills | Status: DC
Start: 2020-01-26 — End: 2020-02-14

## 2020-01-26 NOTE — Progress Notes (Signed)
silvidene

## 2020-02-14 ENCOUNTER — Encounter: Payer: Self-pay | Admitting: Gastroenterology

## 2020-02-14 ENCOUNTER — Ambulatory Visit (INDEPENDENT_AMBULATORY_CARE_PROVIDER_SITE_OTHER): Payer: Medicare Other | Admitting: Gastroenterology

## 2020-02-14 ENCOUNTER — Encounter (INDEPENDENT_AMBULATORY_CARE_PROVIDER_SITE_OTHER): Payer: Self-pay | Admitting: Nurse Practitioner

## 2020-02-14 ENCOUNTER — Other Ambulatory Visit: Payer: Self-pay

## 2020-02-14 ENCOUNTER — Ambulatory Visit (INDEPENDENT_AMBULATORY_CARE_PROVIDER_SITE_OTHER): Payer: Medicare Other | Admitting: Nurse Practitioner

## 2020-02-14 VITALS — BP 139/71 | HR 78 | Temp 97.1°F | Ht 66.0 in | Wt 195.8 lb

## 2020-02-14 VITALS — BP 120/72 | HR 85 | Temp 97.5°F | Resp 18 | Ht 66.0 in | Wt 196.0 lb

## 2020-02-14 DIAGNOSIS — Z1159 Encounter for screening for other viral diseases: Secondary | ICD-10-CM

## 2020-02-14 DIAGNOSIS — R197 Diarrhea, unspecified: Secondary | ICD-10-CM | POA: Diagnosis not present

## 2020-02-14 DIAGNOSIS — R5381 Other malaise: Secondary | ICD-10-CM

## 2020-02-14 DIAGNOSIS — Z0000 Encounter for general adult medical examination without abnormal findings: Secondary | ICD-10-CM | POA: Diagnosis not present

## 2020-02-14 DIAGNOSIS — R5383 Other fatigue: Secondary | ICD-10-CM

## 2020-02-14 DIAGNOSIS — E559 Vitamin D deficiency, unspecified: Secondary | ICD-10-CM

## 2020-02-14 NOTE — Patient Instructions (Signed)
I am glad you are doing well!  Keep the Bentyl on hand in case you need it.  Please call if recurrent diarrhea, abdominal pain, or rectal bleeding.  We will see you back in 1 year!  I enjoyed seeing you again today! As you know, I value our relationship and want to provide genuine, compassionate, and quality care. I welcome your feedback. If you receive a survey regarding your visit,  I greatly appreciate you taking time to fill this out. See you next time!  Gelene Mink, PhD, ANP-BC [facility]astroenter[facility]

## 2020-02-14 NOTE — Progress Notes (Signed)
Referring Provider: Wilson Singer, MD Primary Care Physician:  Wilson Singer, MD Primary GI: Dr. Jena Gauss   Chief Complaint  Patient presents with  . Follow-up    was having 8 to 9 bms a day, stopped med and much better now    HPI:   Juan [name] is a 29 y.o. male presenting today with a history of chronic diarrhea dating back to hospitalization 5 years ago. Celiac serologies were recommended. Bentyl given for supportive measures. Colonoscopy if no improvement.   Was hard to take dicyclomine with him to work. Forgot to take but now doing well. He is down to 4 soft postprandial stools daily. Previously 8-9 stools per day. Frequency depends on what he eats. No rectal bleeding. No abdominal pain. Not taking dicyclomine. Good appetite. No N/V. Doing well and has no complaints. No other medication changes.    Past Medical History:  Diagnosis Date  . Closed left subtrochanteric femur fracture (HCC) 01/06/2015  . Open fracture of right tibia 01/06/2015  . Open fracture of shaft of left tibia, type III 01/06/2015  . Seizures (HCC)    x 1 at age 21    Past Surgical History:  Procedure Laterality Date  . ESOPHAGOGASTRODUODENOSCOPY (EGD) WITH PROPOFOL N/A 01/18/2015   Procedure: ESOPHAGOGASTRODUODENOSCOPY (EGD) WITH PROPOFOL;  Surgeon: Violeta Gelinas, MD;  Location: [facility] ENDOSCOPY;  Service: General;  Laterality: N/A;  . FEMUR IM NAIL Bilateral 01/06/2015   Procedure: IRRIGATION AND DEBRIDEMENT BILATERAL LEGS WITH APPLICATION EXTERNAL FIXATOR RIGHT  TIBIA AND APPLICATION EXTERNAL FIXATORS TO LEFT FEMUR  AND LEFT TIBIA ;  Surgeon: Teryl Lucy, MD;  Location: [facility] OR;  Service: Orthopedics;  Laterality: Bilateral;  . FEMUR IM NAIL Left 01/15/2015   Procedure: INTRAMEDULLARY (IM) RETROGRADE FEMORAL NAILING;  Surgeon: Myrene Galas, MD;  Location: [facility] OR;  Service: Orthopedics;  Laterality: Left;  . I & D EXTREMITY Bilateral 01/15/2015   Procedure: IRRIGATION AND DEBRIDEMENT BILATERAL  EXTREMITY;  Surgeon: Myrene Galas, MD;  Location[facility] OR;  Service: Orthopedics;  Laterality: Bilateral;  . ORIF PELVIC FRACTURE Bilateral 01/15/2015   Procedure: orif pelvis bilateral iliac screws percantaneous fixation left tavern, im nail bilateral tibia, retrograde im nail left femur, removal of external fixation ;  Surgeon: Myrene Galas, MD;  Location: [facility] OR;  Service: Orthopedics;  Laterality: Bilateral;  . PEG PLACEMENT N/A 01/18/2015   Procedure: PERCUTANEOUS ENDOSCOPIC GASTROSTOMY (PEG) PLACEMENT;  Surgeon: Violeta Gelinas, MD;  Location: [facility] ENDOSCOPY;  Service: General;  Laterality: N/A;  bedside  . PERCUTANEOUS TRACHEOSTOMY N/A 01/18/2015   Procedure: PERCUTANEOUS TRACHEOSTOMY (BEDSIDE);  Surgeon: Violeta Gelinas, MD;  Location: [facility] OR;  Service: General;  Laterality: N/A;  . TIBIA IM NAIL INSERTION Bilateral 01/15/2015   Procedure: INTRAMEDULLARY (IM) NAIL TIBIAL;  Surgeon: Myrene Galas, MD;  Location: [facility] OR;  Service: Orthopedics;  Laterality: Bilateral;    Current Outpatient Medications  Medication Sig Dispense Refill  . Cholecalciferol (VITAMIN D-3) 125 MCG (5000 UT) TABS Take 2 tablets by mouth daily.     . cyclobenzaprine (FLEXERIL) 5 MG tablet Take 1 tablet (5 mg total) by mouth 3 (three) times daily as needed for muscle spasms. (Patient taking differently: Take by mouth as needed for muscle spasms. ) 30 tablet 1  . loratadine (CLARITIN) 10 MG tablet Take 1 tablet (10 mg total) by mouth daily. (Patient taking differently: Take 10 mg by mouth daily. As needed) 30 tablet 3  . NP THYROID 90 MG tablet Take 1 tablet (90  mg total) by mouth daily. 30 tablet 3  . sertraline (ZOLOFT) 25 MG tablet Take 25 mg by mouth daily.     No current facility-administered medications for this visit.    Allergies as of 02/14/2020  . (No Known Allergies)    Family History  Problem Relation Age of Onset  . Diabetes Father   . Hypertension Mother   . Cancer Maternal Grandfather   . Colon cancer Neg  Hx   . Gastric cancer Neg Hx   . Esophageal cancer Neg Hx     Social History   Socioeconomic History  . Marital status: Single    Spouse name: Not on file  . Number of children: 1  . Years of education: Not on file  . Highest education level: Not on file  Occupational History  . Occupation: Unemployed  Tobacco Use  . Smoking status: Former Smoker    Types: Cigarettes  . Smokeless tobacco: Former Neurosurgeon  . Tobacco comment: socially smokes now, typically 3 times a month "only when I'm drinking"  Substance and Sexual Activity  . Alcohol use: Yes    Comment: occasionally   . Drug use: No    Types: Marijuana    Comment: last used 07/25/12  . Sexual activity: Yes  Other Topics Concern  . Not on file  Social History Narrative    Lives with wife,married on August 2020-been together 7 years.Lives with wife and 1 son.Works for [employer] .   Social Determinants of Health   Financial Resource Strain:   . Difficulty of Paying Living Expenses: Not on file  Food Insecurity:   . Worried About Programme researcher, broadcasting/film/video in the Last Year: Not on file  . Ran Out of Food in the Last Year: Not on file  Transportation Needs:   . Lack of Transportation (Medical): Not on file  . Lack of Transportation (Non-Medical): Not on file  Physical Activity:   . Days of Exercise per Week: Not on file  . Minutes of Exercise per Session: Not on file  Stress:   . Feeling of Stress : Not on file  Social Connections:   . Frequency of Communication with Friends and Family: Not on file  . Frequency of Social Gatherings with Friends and Family: Not on file  . Attends Religious Services: Not on file  . Active Member of Clubs or Organizations: Not on file  . Attends Banker Meetings: Not on file  . Marital Status: Not on file    Review of Systems: Gen: Denies fever, chills, anorexia. Denies fatigue, weakness, weight loss.  CV: Denies chest pain, palpitations, syncope, peripheral edema, and  claudication. Resp: Denies dyspnea at rest, cough, wheezing, coughing up blood, and pleurisy. GI: see HPI Derm: Denies rash, itching, dry skin Psych: Denies depression, anxiety, memory loss, confusion. No homicidal or suicidal ideation.  Heme: Denies bruising, bleeding, and enlarged lymph nodes.  Physical Exam: BP 139/71   Pulse 78   Temp (!) 97.1 F (36.2 C) (Temporal)   Ht 5\' 6"  (1.676 m)   Wt 195 lb 12.8 oz (88.8 kg)   BMI 31.60 kg/m  General:   Alert and oriented. No distress noted. Pleasant and cooperative.  Head:  Normocephalic and atraumatic. Eyes:  Conjuctiva clear without scleral icterus. Mouth:  Mask in place Abdomen:  +BS, soft, non-tender and non-distended. No rebound or guarding. No HSM or masses noted. Msk:  Symmetrical without gross deformities. Normal posture. Extremities:  Without edema. Neurologic:  Alert and  oriented x4 Psych:  Alert and cooperative. Normal mood and affect.  ASSESSMENT/PLAN: Dequincy Born is a 29 y.o. male presenting today with history of chronic diarrhea that has now markedly improved since when last seen. He had been provided Bentyl but has not required this, stating he now has just one postprandial soft bowel movement after eating. No concerning signs/symptoms. No need for colonoscopy. He will keep Bentyl on hand if needed; otherwise, we will see him in 1 year. Call with any changes.   Gelene Mink, PhD, ANP-BC [facility] Gastroenterology

## 2020-02-14 NOTE — Progress Notes (Signed)
Subjective:   Juan [name] is a 29 y.o. male who presents for Medicare Annual/Subsequent preventive examination.   Cardiac Risk Factors include: male gender;obesity (BMI >30kg/m2)     Objective:    Today's Vitals   02/14/20 1633  BP: 120/72  Pulse: 85  Resp: 18  Temp: (!) 97.5 F (36.4 C)  TempSrc: Temporal  SpO2: 98%  Weight: 196 lb (88.9 kg)  Height: 5\' 6"  (1.676 m)   Body mass index is 31.64 kg/m.  Advanced Directives 02/14/2020 10/03/2019  Does Patient Have a Medical Advance Directive? No No  Would patient like information on creating a medical advance directive? No - Patient declined No - Patient declined  Some encounter information is confidential and restricted. Go to Review Flowsheets activity to see all data.    Current Medications (verified) Outpatient Encounter Medications as of 02/14/2020  Medication Sig  . Cholecalciferol (VITAMIN D-3) 125 MCG (5000 UT) TABS Take 2 tablets by mouth daily.   . cyclobenzaprine (FLEXERIL) 5 MG tablet Take 1 tablet (5 mg total) by mouth 3 (three) times daily as needed for muscle spasms. (Patient taking differently: Take by mouth as needed for muscle spasms. )  . loratadine (CLARITIN) 10 MG tablet Take 1 tablet (10 mg total) by mouth daily. (Patient taking differently: Take 10 mg by mouth daily. As needed)  . methocarbamol (ROBAXIN) 500 MG tablet Take 500 mg by mouth 3 (three) times daily.  . NP THYROID 90 MG tablet Take 1 tablet (90 mg total) by mouth daily.  . sertraline (ZOLOFT) 25 MG tablet Take 25 mg by mouth daily.  . [DISCONTINUED] loperamide (IMODIUM) 2 MG capsule Take 2-8 mg by mouth daily.   No facility-administered encounter medications on file as of 02/14/2020.    Allergies (verified) Patient has no known allergies.   History: Past Medical History:  Diagnosis Date  . Closed left subtrochanteric femur fracture (HCC) 01/06/2015  . Open fracture of right tibia 01/06/2015  . Open fracture of shaft of left tibia, type  III 01/06/2015  . Seizures (HCC)    x 1 at age 29   Past Surgical History:  Procedure Laterality Date  . ESOPHAGOGASTRODUODENOSCOPY (EGD) WITH PROPOFOL N/A 01/18/2015   Procedure: ESOPHAGOGASTRODUODENOSCOPY (EGD) WITH PROPOFOL;  Surgeon: [name], MD;  [facility];  Service: General;  Laterality: N/A;  . FEMUR IM NAIL Bilateral 01/06/2015   Procedure: IRRIGATION AND DEBRIDEMENT BILATERAL LEGS WITH APPLICATION EXTERNAL FIXATOR RIGHT  TIBIA AND APPLICATION EXTERNAL FIXATORS TO LEFT FEMUR  AND LEFT TIBIA ;  Surgeon: [name], MD;  Location: [facility] OR;  Service: Orthopedics;  Laterality: Bilateral;  . FEMUR IM NAIL Left 01/15/2015   Procedure: INTRAMEDULLARY (IM) RETROGRADE FEMORAL NAILING;  Surgeon: [name], MD;  Location: [facility] OR;  Service: Orthopedics;  Laterality: Left;  . I & D EXTREMITY Bilateral 01/15/2015   Procedure: IRRIGATION AND DEBRIDEMENT BILATERAL EXTREMITY;  Surgeon: [name], MD;  [facility];  Service: Orthopedics;  Laterality: Bilateral;  . ORIF PELVIC FRACTURE Bilateral 01/15/2015   Procedure: orif pelvis bilateral iliac screws percantaneous fixation left tavern, im nail bilateral tibia, retrograde im nail left femur, removal of external fixation ;  Surgeon: [name], MD;  [facility];  Service: Orthopedics;  Laterality: Bilateral;  . PEG PLACEMENT N/A 01/18/2015   Procedure: PERCUTANEOUS ENDOSCOPIC GASTROSTOMY (PEG) PLACEMENT;  Surgeon: [name], MD;  [facility];  Service: General;  Laterality: N/A;  bedside  . PERCUTANEOUS TRACHEOSTOMY N/A 01/18/2015   Procedure: PERCUTANEOUS TRACHEOSTOMY (  BEDSIDE);  Surgeon: Violeta Gelinas, MD;  Location: [facility] OR;  Service: General;  Laterality: N/A;  . TIBIA IM NAIL INSERTION Bilateral 01/15/2015   Procedure: INTRAMEDULLARY (IM) NAIL TIBIAL;  Surgeon: Myrene Galas, MD;  [facility] OR;  Service: Orthopedics;  Laterality: Bilateral;   Family History  Problem Relation Age of Onset  . Diabetes  Father   . Hypertension Mother   . Cancer Maternal Grandfather   . Colon cancer Neg Hx   . Gastric cancer Neg Hx   . Esophageal cancer Neg Hx    Social History   Socioeconomic History  . Marital status: Single    Spouse name: Not on file  . Number of children: 1  . Years of education: Not on file  . Highest education level: Not on file  Occupational History  . Occupation: Unemployed  Tobacco Use  . Smoking status: Former Smoker    Types: Cigarettes  . Smokeless tobacco: Former Neurosurgeon  . Tobacco comment: socially smokes now, typically 3 times a month "only when I'm drinking"  Substance and Sexual Activity  . Alcohol use: Yes    Comment: occasionally   . Drug use: No    Types: Marijuana    Comment: last used 07/25/12  . Sexual activity: Yes  Other Topics Concern  . Not on file  Social History Narrative    Lives with wife,married on August 2020-been together 7 years.Lives with wife and 1 son.Works for [employer] .   Social Determinants of Health   Financial Resource Strain:   . Difficulty of Paying Living Expenses: Not on file  Food Insecurity:   . Worried About Programme researcher, broadcasting/film/video in the Last Year: Not on file  . Ran Out of Food in the Last Year: Not on file  Transportation Needs:   . Lack of Transportation (Medical): Not on file  . Lack of Transportation (Non-Medical): Not on file  Physical Activity:   . Days of Exercise per Week: Not on file  . Minutes of Exercise per Session: Not on file  Stress:   . Feeling of Stress : Not on file  Social Connections:   . Frequency of Communication with Friends and Family: Not on file  . Frequency of Social Gatherings with Friends and Family: Not on file  . Attends Religious Services: Not on file  . Active Member of Clubs or Organizations: Not on file  . Attends Banker Meetings: Not on file  . Marital Status: Not on file    Tobacco Counseling Counseling given: Not Answered Comment: socially smokes now, typically 3  times a month "only when I'm drinking"   Clinical Intake:  Pre-visit preparation completed: Yes  Pain : No/denies pain     BMI - recorded: 31.64 Nutritional Status: BMI > 30  Obese Nutritional Risks: None Diabetes: No  How often do you need to have someone help you when you read instructions, pamphlets, or other written materials from your doctor or pharmacy?: 1 - Never What is the last grade level you completed in school?: 9th grade  Diabetic? No   Interpreter Needed?: No  Information entered by :: Jiles Prows, NP-C   Activities of Daily Living In your present state of health, do you have any difficulty performing the following activities: 02/14/2020  Hearing? N  Vision? Y  Comment Can see well with glasses  Difficulty concentrating or making decisions? Y  Walking or climbing stairs? N  Dressing or bathing? N  Doing errands, shopping? N  Preparing Food and eating ? N  Using the Toilet? N  In the past six months, have you accidently leaked urine? N  Do you have problems with loss of bowel control? N  Managing your Medications? N  Managing your Finances? N  Housekeeping or managing your Housekeeping? N  Some recent data might be hidden    Patient Care Team: Wilson Singer, MD as PCP - General (Internal Medicine) Jena Gauss, Gerrit Friends, MD as Consulting Physician (Gastroenterology)  Indicate any recent Medical Services you may have received from other than [facility] providers in the past year (date may be approximate).     Assessment:   This is a routine wellness examination for Juan [name].  Hearing/Vision screen No exam data present  Dietary issues and exercise activities discussed: Current Exercise Habits: The patient has a physically strenuous job, but has no regular exercise apart from work., Exercise limited by: None identified  Goals   None    Depression Screen PHQ 2/9 Scores 02/14/2020 10/04/2019  PHQ - 2 Score 0 0  Exception Documentation - Medical reason   Some encounter information is confidential and restricted. Go to Review Flowsheets activity to see all data.    Fall Risk Fall Risk  02/14/2020 10/04/2019 04/07/2016  Falls in the past year? 0 0 -  Comment - - fell last sunday 03/30/16  Number falls in past yr: 0 0 -  Injury with Fall? 0 0 -  Comment - - went to ED/"sprained arm"  Risk for fall due to : - No Fall Risks -  Follow up Education provided;Falls prevention discussed;Falls evaluation completed Falls evaluation completed -  Some encounter information is confidential and restricted. Go to Review Flowsheets activity to see all data.    Any stairs in or around the home? Yes  If so, are there any without handrails? Yes  Home free of loose throw rugs in walkways, pet beds, electrical cords, etc? No  Adequate lighting in your home to reduce risk of falls? Yes   ASSISTIVE DEVICES UTILIZED TO PREVENT FALLS:  Life alert? No  Use of a cane, walker or w/c? No  Grab bars in the bathroom? No  Shower chair or bench in shower? Yes  Elevated toilet seat or a handicapped toilet? No   TIMED UP AND GO:  Was the test performed? Yes .  Length of time to ambulate 10 feet: 8 sec.   Gait steady and fast without use of assistive device  Cognitive Function:     6CIT Screen 02/14/2020  What Year? 0 points  What month? 0 points  What time? 0 points  Count back from 20 0 points  Months in reverse 4 points  Repeat phrase 2 points  Total Score 6    Immunizations Immunization History  Administered Date(s) Administered  . Influenza,inj,Quad PF,6+ Mos 01/19/2015  . Tdap 01/06/2015    TDAP status: Up to date Flu Vaccine status: Declined, Education has been provided regarding the importance of this vaccine but patient still declined. Advised may receive this vaccine at local pharmacy or Health Dept. Aware to provide a copy of the vaccination record if obtained from local pharmacy or Health Dept. Verbalized acceptance and  understanding. Pneumonia Vaccine: patient does not qualify Covid-19 vaccine status: Declined, Education has been provided regarding the importance of this vaccine but patient still declined. Advised may receive this vaccine at local pharmacy or Health Dept.or vaccine clinic. Aware to provide a copy of the vaccination record if obtained from local pharmacy  or Health Dept. Verbalized acceptance and understanding.  Qualifies for Shingles Vaccine? No   Zostavax completed No   Shingrix: does not qualify  Screening Tests Health Maintenance  Topic Date Due  . Hepatitis C Screening  Never done  . COVID-19 Vaccine (1) Never done  . INFLUENZA VACCINE  12/18/2019  . TETANUS/TDAP  01/05/2025  . HIV Screening  Completed    Health Maintenance  Health Maintenance Due  Topic Date Due  . Hepatitis C Screening  Never done  . COVID-19 Vaccine (1) Never done  . INFLUENZA VACCINE  12/18/2019    Colon Cancer Screening: does not qualify  Lung Cancer Screening: (Low Dose CT Chest recommended if Age 70-80 years, 30 pack-year currently smoking OR have quit w/in 15years.) does not qualify.  (PY is 5.4)  Lung Cancer Screening Referral: N/A  Additional Screening:  Hepatitis C Screening: does qualify; Completed: today  Vision Screening: Recommended annual ophthalmology exams for early detection of glaucoma and other disorders of the eye. Is the patient up to date with their annual eye exam?  Yes  Who is the provider or what is the name of the office in which the patient attends annual eye exams? Dr. Charise Killian If pt is not established with a provider, would they like to be referred to a provider to establish care? No .   Dental Screening: Recommended annual dental exams for proper oral hygiene  Community Resource Referral / Chronic Care Management: CRR required this visit?  No   CCM required this visit?  No      Plan:    Due for flu and COVID-19 vaccines.  He has declined to have these  administered today.  When offered to schedule him for flu shot next week, he tells me he will let me know when he is available to have this administered.  He is up-to-date with most screenings recommended for a male of his age.  Is due for hepatitis C screening today which we will do today.  I have personally reviewed and noted the following in the patient's chart:   . Medical and social history . Use of alcohol, tobacco or illicit drugs  . Current medications and supplements . Functional ability and status . Nutritional status . Physical activity . Advanced directives . List of other physicians . Hospitalizations, surgeries, and ER visits in previous 12 months . Vitals . Screenings to include cognitive, depression, and falls . Referrals and appointments  In addition, I have reviewed and discussed with patient certain preventive protocols, quality metrics, and best practice recommendations. A written personalized care plan for preventive services as well as general preventive health recommendations were provided to patient.    He will follow-up for office visit in 3 months.   Elenore Paddy, NP   02/14/2020

## 2020-02-14 NOTE — Patient Instructions (Signed)
  Mr. Juan [name] , Thank you for taking time to come for your Medicare Wellness Visit. I appreciate your ongoing commitment to your health goals. Please review the following plan we discussed and let me know if I can assist you in the future.   These are the goals we discussed: Goals   None     This is a list of the screening recommended for you and due dates:  Health Maintenance  Topic Date Due  .  Hepatitis C: One time screening is recommended by Center for Disease Control  (CDC) for  adults born from [birth year] through 1965.   Never done  . COVID-19 Vaccine (1) Never done  . Flu Shot  12/18/2019  . Tetanus Vaccine  01/05/2025  . HIV Screening  Completed

## 2020-02-15 DIAGNOSIS — R5381 Other malaise: Secondary | ICD-10-CM | POA: Diagnosis not present

## 2020-02-15 DIAGNOSIS — R5383 Other fatigue: Secondary | ICD-10-CM | POA: Diagnosis not present

## 2020-02-15 DIAGNOSIS — Z1159 Encounter for screening for other viral diseases: Secondary | ICD-10-CM | POA: Diagnosis not present

## 2020-02-15 DIAGNOSIS — E559 Vitamin D deficiency, unspecified: Secondary | ICD-10-CM | POA: Diagnosis not present

## 2020-02-16 LAB — COMPLETE METABOLIC PANEL WITH GFR
AG Ratio: 1.8 (calc) (ref 1.0–2.5)
ALT: 24 U/L (ref 9–46)
AST: 17 U/L (ref 10–40)
Albumin: 4.7 g/dL (ref 3.6–5.1)
Alkaline phosphatase (APISO): 81 U/L (ref 36–130)
BUN: 12 mg/dL (ref 7–25)
CO2: 28 mmol/L (ref 20–32)
Calcium: 9.9 mg/dL (ref 8.6–10.3)
Chloride: 102 mmol/L (ref 98–110)
Creat: 0.83 mg/dL (ref 0.60–1.35)
GFR, Est African American: 139 mL/min/{1.73_m2} (ref >=60)
GFR, Est Non African American: 120 mL/min/{1.73_m2} (ref >=60)
Globulin: 2.6 g/dL (calc) (ref 1.9–3.7)
Glucose, Bld: 119 mg/dL — ABNORMAL HIGH (ref 65–99)
Potassium: 4 mmol/L (ref 3.5–5.3)
Sodium: 138 mmol/L (ref 135–146)
Total Bilirubin: 0.4 mg/dL (ref 0.2–1.2)
Total Protein: 7.3 g/dL (ref 6.1–8.1)

## 2020-02-16 LAB — CBC
HCT: 42.1 % (ref 38.5–50.0)
Hemoglobin: 14.4 g/dL (ref 13.2–17.1)
MCH: 29.4 pg (ref 27.0–33.0)
MCHC: 34.2 g/dL (ref 32.0–36.0)
MCV: 85.9 fL (ref 80.0–100.0)
MPV: 10 fL (ref 7.5–12.5)
Platelets: 253 10*3/uL (ref 140–400)
RBC: 4.9 10*6/uL (ref 4.20–5.80)
RDW: 12.9 % (ref 11.0–15.0)
WBC: 5.9 10*3/uL (ref 3.8–10.8)

## 2020-02-16 LAB — T3, FREE: T3, Free: 4.1 pg/mL (ref 2.3–4.2)

## 2020-02-16 LAB — T4, FREE: Free T4: 1.1 ng/dL (ref 0.8–1.8)

## 2020-02-16 LAB — VITAMIN D 25 HYDROXY (VIT D DEFICIENCY, FRACTURES): Vit D, 25-Hydroxy: 59 ng/mL (ref 30–100)

## 2020-02-16 LAB — TSH: TSH: 0.42 mIU/L (ref 0.40–4.50)

## 2020-02-16 LAB — HEPATITIS C ANTIBODY
Hepatitis C Ab: NONREACTIVE
SIGNAL TO CUT-OFF: 0.01 (ref <1.00)

## 2020-02-28 ENCOUNTER — Telehealth (INDEPENDENT_AMBULATORY_CARE_PROVIDER_SITE_OTHER): Payer: Self-pay | Admitting: Internal Medicine

## 2020-02-28 ENCOUNTER — Other Ambulatory Visit (INDEPENDENT_AMBULATORY_CARE_PROVIDER_SITE_OTHER): Payer: Self-pay | Admitting: Internal Medicine

## 2020-02-28 MED ORDER — NP THYROID 90 MG PO TABS
90.0000 mg | ORAL_TABLET | Freq: Every day | ORAL | 3 refills | Status: DC
Start: 2020-02-28 — End: 2020-07-23

## 2020-02-28 NOTE — Telephone Encounter (Signed)
Done

## 2020-04-08 ENCOUNTER — Other Ambulatory Visit: Payer: Self-pay

## 2020-04-08 ENCOUNTER — Ambulatory Visit
Admission: EM | Admit: 2020-04-08 | Discharge: 2020-04-08 | Disposition: A | Discharge status: Home / Self Care | Payer: Medicare Other | Attending: Emergency Medicine | Admitting: Emergency Medicine

## 2020-04-08 ENCOUNTER — Encounter: Payer: Self-pay | Admitting: Emergency Medicine

## 2020-04-08 DIAGNOSIS — Z1152 Encounter for screening for COVID-19: Secondary | ICD-10-CM

## 2020-04-08 DIAGNOSIS — J069 Acute upper respiratory infection, unspecified: Secondary | ICD-10-CM

## 2020-04-08 DIAGNOSIS — R059 Cough, unspecified: Secondary | ICD-10-CM | POA: Diagnosis not present

## 2020-04-08 MED ORDER — DEXAMETHASONE 4 MG PO TABS: 4.0000 mg | ORAL_TABLET | Freq: Every day | ORAL | 0 refills | Status: AC

## 2020-04-08 MED ORDER — BENZONATATE 100 MG PO CAPS: 100.0000 mg | ORAL_CAPSULE | Freq: Three times a day (TID) | ORAL | 0 refills | Status: DC

## 2020-04-08 MED ORDER — FLUTICASONE PROPIONATE 50 MCG/ACT NA SUSP: 1.0000 | Freq: Every day | NASAL | 0 refills | Status: DC

## 2020-04-08 NOTE — ED Triage Notes (Signed)
Body aches,runny nose and cough x 3 days  

## 2020-04-08 NOTE — Discharge Instructions (Addendum)
COVID testing ordered.  It will take between 2-7 days for test results.  Someone will contact you regarding abnormal results.    In the meantime: You should remain isolated in your home for 10 days from symptom onset AND greater than 24 hours after symptoms resolution (absence of fever without the use of fever-reducing medication and improvement in respiratory symptoms), whichever is longer Get plenty of rest and push fluids Tessalon Perles prescribed for cough Continue zyrtec for nasal congestion, runny nose, and/or sore throat flonase for nasal congestion and runny nose Decadron was prescribed Use medications daily for symptom relief Use OTC medications like ibuprofen or tylenol as needed fever or pain Call or go to the ED if you have any new or worsening symptoms such as fever, worsening cough, shortness of breath, chest tightness, chest pain, turning blue, changes in mental status, etc..Marland Kitchen

## 2020-04-08 NOTE — ED Provider Notes (Signed)
[facility]   [id number] 04/08/20 Arrival Time: 1323   CC: COVID symptoms  SUBJECTIVE: History from: patient.  Juan [name] is a 29 y.o. male who presents  to the urgent care with a complaint of cough, nasal congestion, body aches for the past 3 days.  Denies sick exposure to COVID, flu or strep.  Denies recent travel.  Has tried OTC medication without relief.  Denies aggravating factors.  Denies previous symptoms in the past.   Denies fever, chills, fatigue, sinus pain, rhinorrhea, sore throat, SOB, wheezing, chest pain, nausea, changes in bowel or bladder habits.     ROS: As per HPI.  All other pertinent ROS negative.     Past Medical History:  Diagnosis Date  . Closed left subtrochanteric femur fracture (HCC) 01/06/2015  . Open fracture of right tibia 01/06/2015  . Open fracture of shaft of left tibia, type III 01/06/2015  . Seizures (HCC)    x 1 at age 73   Past Surgical History:  Procedure Laterality Date  . ESOPHAGOGASTRODUODENOSCOPY (EGD) WITH PROPOFOL N/A 01/18/2015   Procedure: ESOPHAGOGASTRODUODENOSCOPY (EGD) WITH PROPOFOL;  Surgeon: Violeta Gelinas, MD;  Location: [facility] ENDOSCOPY;  Service: General;  Laterality: N/A;  . FEMUR IM NAIL Bilateral 01/06/2015   Procedure: IRRIGATION AND DEBRIDEMENT BILATERAL LEGS WITH APPLICATION EXTERNAL FIXATOR RIGHT  TIBIA AND APPLICATION EXTERNAL FIXATORS TO LEFT FEMUR  AND LEFT TIBIA ;  Surgeon: Teryl Lucy, MD;  Location: [facility] OR;  Service: Orthopedics;  Laterality: Bilateral;  . FEMUR IM NAIL Left 01/15/2015   Procedure: INTRAMEDULLARY (IM) RETROGRADE FEMORAL NAILING;  Surgeon: Myrene Galas, MD;  Location: [facility] OR;  Service: Orthopedics;  Laterality: Left;  . I & D EXTREMITY Bilateral 01/15/2015   Procedure: IRRIGATION AND DEBRIDEMENT BILATERAL EXTREMITY;  Surgeon: Myrene Galas, MD;  Location: [facility] OR;  Service: Orthopedics;  Laterality: Bilateral;  . ORIF PELVIC FRACTURE Bilateral 01/15/2015   Procedure: orif pelvis bilateral iliac screws  percantaneous fixation left tavern, im nail bilateral tibia, retrograde im nail left femur, removal of external fixation ;  Surgeon: Myrene Galas, MD;  Location: [facility] OR;  Service: Orthopedics;  Laterality: Bilateral;  . PEG PLACEMENT N/A 01/18/2015   Procedure: PERCUTANEOUS ENDOSCOPIC GASTROSTOMY (PEG) PLACEMENT;  Surgeon: Violeta Gelinas, MD;  Location: [facility] ENDOSCOPY;  Service: General;  Laterality: N/A;  bedside  . PERCUTANEOUS TRACHEOSTOMY N/A 01/18/2015   Procedure: PERCUTANEOUS TRACHEOSTOMY (BEDSIDE);  Surgeon: Violeta Gelinas, MD;  Location: [facility] OR;  Service: General;  Laterality: N/A;  . TIBIA IM NAIL INSERTION Bilateral 01/15/2015   Procedure: INTRAMEDULLARY (IM) NAIL TIBIAL;  Surgeon: Myrene Galas, MD;  Location: [facility] OR;  Service: Orthopedics;  Laterality: Bilateral;   No Known Allergies No current facility-administered medications on file prior to encounter.   Current Outpatient Medications on File Prior to Encounter  Medication Sig Dispense Refill  . Cholecalciferol (VITAMIN D-3) 125 MCG (5000 UT) TABS Take 2 tablets by mouth daily.     . cyclobenzaprine (FLEXERIL) 5 MG tablet Take 1 tablet (5 mg total) by mouth 3 (three) times daily as needed for muscle spasms. (Patient taking differently: Take by mouth as needed for muscle spasms. ) 30 tablet 1  . loratadine (CLARITIN) 10 MG tablet Take 1 tablet (10 mg total) by mouth daily. (Patient taking differently: Take 10 mg by mouth daily. As needed) 30 tablet 3  . methocarbamol (ROBAXIN) 500 MG tablet Take 500 mg by mouth 3 (three) times daily.    . NP THYROID 90 MG tablet Take 1 tablet (90 mg  total) by mouth daily. 30 tablet 3  . sertraline (ZOLOFT) 25 MG tablet Take 25 mg by mouth daily.    . [DISCONTINUED] loperamide (IMODIUM) 2 MG capsule Take 2-8 mg by mouth daily.     Social History   Socioeconomic History  . Marital status: Single    Spouse name: Not on file  . Number of children: 1  . Years of education: Not on file  . Highest  education level: Not on file  Occupational History  . Occupation: Unemployed  Tobacco Use  . Smoking status: Former Smoker    Types: Cigarettes  . Smokeless tobacco: Former Neurosurgeon  . Tobacco comment: socially smokes now, typically 3 times a month "only when I'm drinking"  Substance and Sexual Activity  . Alcohol use: Yes    Comment: occasionally   . Drug use: No    Types: Marijuana    Comment: last used 07/25/12  . Sexual activity: Yes  Other Topics Concern  . Not on file  Social History Narrative    Lives with wife,married on August 2020-been together 7 years.Lives with wife and 1 son.Works for [employer] .   Social Determinants of Health   Financial Resource Strain:   . Difficulty of Paying Living Expenses: Not on file  Food Insecurity:   . Worried About Programme researcher, broadcasting/film/video in the Last Year: Not on file  . Ran Out of Food in the Last Year: Not on file  Transportation Needs:   . Lack of Transportation (Medical): Not on file  . Lack of Transportation (Non-Medical): Not on file  Physical Activity:   . Days of Exercise per Week: Not on file  . Minutes of Exercise per Session: Not on file  Stress:   . Feeling of Stress : Not on file  Social Connections:   . Frequency of Communication with Friends and Family: Not on file  . Frequency of Social Gatherings with Friends and Family: Not on file  . Attends Religious Services: Not on file  . Active Member of Clubs or Organizations: Not on file  . Attends Banker Meetings: Not on file  . Marital Status: Not on file  Intimate Partner Violence:   . Fear of Current or Ex-Partner: Not on file  . Emotionally Abused: Not on file  . Physically Abused: Not on file  . Sexually Abused: Not on file   Family History  Problem Relation Age of Onset  . Diabetes Father   . Hypertension Mother   . Cancer Maternal Grandfather   . Colon cancer Neg Hx   . Gastric cancer Neg Hx   . Esophageal cancer Neg Hx     OBJECTIVE:  Vitals:    04/08/20 1445 04/08/20 1446  BP: 122/83   Pulse: 79   Resp: 17   Temp: 98.9 F (37.2 C)   TempSrc: Oral   SpO2: 98%   Weight:  185 lb (83.9 kg)  Height:  5\' 7"  (1.702 m)     General appearance: alert; appears fatigued, but nontoxic; speaking in full sentences and tolerating own secretions HEENT: NCAT; Ears: EACs clear, TMs pearly gray; Eyes: PERRL.  EOM grossly intact. Sinuses: nontender; Nose: nares patent without rhinorrhea, Throat: oropharynx clear, tonsils non erythematous or enlarged, uvula midline  Neck: supple without LAD Lungs: unlabored respirations, symmetrical air entry; cough: moderate; no respiratory distress; CTAB Heart: regular rate and rhythm.  Radial pulses 2+ symmetrical bilaterally Skin: warm and dry Psychological: alert and cooperative; normal mood and affect  LABS:  No results found for this or any previous visit (from the past 24 hour(s)).   ASSESSMENT & PLAN:  1. URI with cough and congestion   2. Encounter for screening for COVID-19     Meds ordered this encounter  Medications  . fluticasone (FLONASE) 50 MCG/ACT nasal spray    Sig: Place 1 spray into both nostrils daily for 14 days.    Dispense:  16 g    Refill:  0  . benzonatate (TESSALON) 100 MG capsule    Sig: Take 1 capsule (100 mg total) by mouth every 8 (eight) hours.    Dispense:  30 capsule    Refill:  0  . dexamethasone (DECADRON) 4 MG tablet    Sig: Take 1 tablet (4 mg total) by mouth daily for 7 days.    Dispense:  7 tablet    Refill:  0   Discharge instructions  COVID testing ordered.  It will take between 2-7 days for test results.  Someone will contact you regarding abnormal results.    In the meantime: You should remain isolated in your home for 10 days from symptom onset AND greater than 24 hours after symptoms resolution (absence of fever without the use of fever-reducing medication and improvement in respiratory symptoms), whichever is longer Get plenty of rest and push  fluids Tessalon Perles prescribed for cough Continue zyrtec for nasal congestion, runny nose, and/or sore throat flonase for nasal congestion and runny nose Decadron was prescribed Use medications daily for symptom relief Use OTC medications like ibuprofen or tylenol as needed fever or pain Call or go to the ED if you have any new or worsening symptoms such as fever, worsening cough, shortness of breath, chest tightness, chest pain, turning blue, changes in mental status, etc...    Reviewed expectations re: course of current medical issues. Questions answered. Outlined signs and symptoms indicating need for more acute intervention. Patient verbalized understanding. After Visit Summary given.         Durward Parcel, FNP 04/08/20 1524

## 2020-04-09 LAB — SARS-COV-2, NAA 2 DAY TAT

## 2020-04-09 LAB — NOVEL CORONAVIRUS, NAA: SARS-CoV-2, NAA: DETECTED — AB

## 2020-04-10 ENCOUNTER — Telehealth (INDEPENDENT_AMBULATORY_CARE_PROVIDER_SITE_OTHER): Payer: Self-pay

## 2020-04-10 ENCOUNTER — Telehealth (HOSPITAL_COMMUNITY): Payer: Self-pay
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
## 2020-04-10 NOTE — Telephone Encounter (Signed)
Patients wife Juan [name] called and stated that the patient tested positive for Covid yesterday and was seen at urgent care on Sunday and his symptoms started on Friday. Wife and patient both have Covid and patient wants to know when he can go back to work?  Also wants to know if he should call to be screened for the antibody infusion? Patient denies symptoms other than like having a mild cold. Patient wants to go back to work quickly.  Please advise.

## 2020-04-10 NOTE — Telephone Encounter (Signed)
1.  He should call the antibody infusion clinic to see if he qualifies for monoclonal antibody infusion. 2.  Going back to work will depend on #1.  If he does not qualify for monoclonal antibody infusion, he will have to wait 2 weeks before he can go to work.

## 2020-04-10 NOTE — Telephone Encounter (Signed)
Called patient back and spoke to his wife and gave her the message from Dr. Karilyn Cota. I let her know that I have contacted the infusion center and they will reach out to him to screen him. Wife stated that she does not have any symptoms and she did get a covid test from [facility] and is awaiting the results. This is a correction to the original statement that she gave me.

## 2020-04-12 ENCOUNTER — Encounter (INDEPENDENT_AMBULATORY_CARE_PROVIDER_SITE_OTHER): Payer: Self-pay | Admitting: Internal Medicine

## 2020-04-14 ENCOUNTER — Ambulatory Visit (HOSPITAL_COMMUNITY): Admission: RE | Admit: 2020-04-14 | Payer: Medicare Other | Source: Ambulatory Visit

## 2020-04-14 ENCOUNTER — Other Ambulatory Visit: Payer: Self-pay | Admitting: Physician Assistant

## 2020-04-14 DIAGNOSIS — U071 COVID-19: Secondary | ICD-10-CM

## 2020-04-14 NOTE — Progress Notes (Signed)
I connected by phone with Juan [name] on 04/14/2020 at 2:01 PM to discuss the potential use of a new treatment for mild to moderate COVID-19 viral infection in non-hospitalized patients.  This patient is a 29 y.o. male that meets the FDA criteria for Emergency Use Authorization of COVID monoclonal antibody casirivimab/imdevimab, bamlanivimab/eteseviamb, or sotrovimab.  Has a (+) direct SARS-CoV-2 viral test result  Has mild or moderate COVID-19   Is NOT hospitalized due to COVID-19  Is within 10 days of symptom onset  Has at least one of the high risk factor(s) for progression to severe COVID-19 and/or hospitalization as defined in EUA.  Specific high risk criteria : BMI > 25 and Other high risk medical condition per CDC:  unvaccinated for COVID   I have spoken and communicated the following to the patient or parent/caregiver regarding COVID monoclonal antibody treatment:  1. FDA has authorized the emergency use for the treatment of mild to moderate COVID-19 in adults and pediatric patients with positive results of direct SARS-CoV-2 viral testing who are 37 years of age and older weighing at least 40 kg, and who are at high risk for progressing to severe COVID-19 and/or hospitalization.  2. The significant known and potential risks and benefits of COVID monoclonal antibody, and the extent to which such potential risks and benefits are unknown.  3. Information on available alternative treatments and the risks and benefits of those alternatives, including clinical trials.  4. Patients treated with COVID monoclonal antibody should continue to self-isolate and use infection control measures (e.g., wear mask, isolate, social distance, avoid sharing personal items, clean and disinfect "high touch" surfaces, and frequent handwashing) according to CDC guidelines.   5. The patient or parent/caregiver has the option to accept or refuse COVID monoclonal antibody treatment.  After reviewing this  information with the patient, the patient has agreed to receive one of the available covid 19 monoclonal antibodies and will be provided an appropriate fact sheet prior to infusion.    [place], [place] 04/14/2020 2:01 PM

## 2020-04-16 ENCOUNTER — Telehealth (INDEPENDENT_AMBULATORY_CARE_PROVIDER_SITE_OTHER): Payer: Self-pay

## 2020-04-16 NOTE — Telephone Encounter (Signed)
Wife left a detailed VM and stated that they had not heard from the Infusion center yet.   I called and spoke with wife Gladys Damme and she stated that the patient spoke to someone at the infusion center and they told him it would be $200 for the infusion and he decided not to get it due to the cost. Patient is feeling better just has fatigue. Advised to rest and stay hydrated. Wife verbalized an understanding.

## 2020-05-22 ENCOUNTER — Ambulatory Visit (INDEPENDENT_AMBULATORY_CARE_PROVIDER_SITE_OTHER): Payer: Medicare Other | Admitting: Internal Medicine

## 2020-05-24 ENCOUNTER — Other Ambulatory Visit: Payer: Self-pay

## 2020-05-24 ENCOUNTER — Ambulatory Visit (INDEPENDENT_AMBULATORY_CARE_PROVIDER_SITE_OTHER): Payer: Medicare Other | Admitting: Internal Medicine

## 2020-05-24 ENCOUNTER — Encounter (INDEPENDENT_AMBULATORY_CARE_PROVIDER_SITE_OTHER): Payer: Self-pay | Admitting: Internal Medicine

## 2020-05-24 VITALS — BP 132/78 | HR 88 | Temp 97.9°F | Resp 18 | Ht 67.0 in | Wt 185.0 lb

## 2020-05-24 DIAGNOSIS — R5383 Other fatigue: Secondary | ICD-10-CM

## 2020-05-24 DIAGNOSIS — R5381 Other malaise: Secondary | ICD-10-CM

## 2020-05-24 DIAGNOSIS — E559 Vitamin D deficiency, unspecified: Secondary | ICD-10-CM | POA: Diagnosis not present

## 2020-05-24 DIAGNOSIS — F419 Anxiety disorder, unspecified: Secondary | ICD-10-CM

## 2020-05-24 MED ORDER — SERTRALINE HCL 25 MG PO TABS: 25.0000 mg | ORAL_TABLET | Freq: Every day | ORAL | 0 refills | Status: DC

## 2020-05-24 NOTE — Progress Notes (Signed)
Metrics: Intervention Frequency ACO  Documented Smoking Status Yearly  Screened one or more times in 24 months  Cessation Counseling or  Active cessation medication Past 24 months  Past 24 months   Guideline developer: UpToDate (See UpToDate for funding source) Date Released: 2014       Wellness Office Visit  Subjective:  Patient ID: Juan [name], male    DOB: 1991-03-29  Age: 30 y.o. MRN: [id number]  CC: This man comes in for follow-up of anxiety, vitamin D deficiency. HPI  Is very functional than the last time I met him and we wondered whether he still needs thyroid therapy. He does need a refill on Zoloft because this seems to help him. He had COVID-19 disease Thanksgiving last year and thankfully made a good recovery. Past Medical History:  Diagnosis Date  . Closed left subtrochanteric femur fracture ([place]) 01/06/2015  . Open fracture of right tibia 01/06/2015  . Open fracture of shaft of left tibia, type III 01/06/2015  . Seizures ([place])    x 1 at age 30   Past Surgical History:  Procedure Laterality Date  . ESOPHAGOGASTRODUODENOSCOPY (EGD) WITH PROPOFOL N/A 01/18/2015   Procedure: ESOPHAGOGASTRODUODENOSCOPY (EGD) WITH PROPOFOL;  Surgeon: Georganna Skeans, MD;  Location: [place];  Service: General;  Laterality: N/A;  . FEMUR IM NAIL Bilateral 01/06/2015   Procedure: IRRIGATION AND DEBRIDEMENT BILATERAL LEGS WITH APPLICATION EXTERNAL FIXATOR RIGHT  TIBIA AND APPLICATION EXTERNAL FIXATORS TO LEFT FEMUR  AND LEFT TIBIA ;  Surgeon: Marchia Bond, MD;  Location: [place];  Service: Orthopedics;  Laterality: Bilateral;  . FEMUR IM NAIL Left 01/15/2015   Procedure: INTRAMEDULLARY (IM) RETROGRADE FEMORAL NAILING;  Surgeon: Altamese Inglis, MD;  Location: [place];  Service: Orthopedics;  Laterality: Left;  . I & D EXTREMITY Bilateral 01/15/2015   Procedure: IRRIGATION AND DEBRIDEMENT BILATERAL EXTREMITY;  Surgeon: Altamese Deale, MD;  Location: [place];  Service: Orthopedics;  Laterality: Bilateral;   . ORIF PELVIC FRACTURE Bilateral 01/15/2015   Procedure: orif pelvis bilateral iliac screws percantaneous fixation left tavern, im nail bilateral tibia, retrograde im nail left femur, removal of external fixation ;  Surgeon: Altamese Reading, MD;  Location: [place];  Service: Orthopedics;  Laterality: Bilateral;  . PEG PLACEMENT N/A 01/18/2015   Procedure: PERCUTANEOUS ENDOSCOPIC GASTROSTOMY (PEG) PLACEMENT;  Surgeon: Georganna Skeans, MD;  Location: [place];  Service: General;  Laterality: N/A;  bedside  . PERCUTANEOUS TRACHEOSTOMY N/A 01/18/2015   Procedure: PERCUTANEOUS TRACHEOSTOMY (BEDSIDE);  Surgeon: Georganna Skeans, MD;  Location: [place];  Service: General;  Laterality: N/A;  . TIBIA IM NAIL INSERTION Bilateral 01/15/2015   Procedure: INTRAMEDULLARY (IM) NAIL TIBIAL;  Surgeon: Altamese Danube, MD;  Location: [place];  Service: Orthopedics;  Laterality: Bilateral;     Family History  Problem Relation Age of Onset  . Diabetes Father   . Hypertension Mother   . Cancer Maternal Grandfather   . Colon cancer Neg Hx   . Gastric cancer Neg Hx   . Esophageal cancer Neg Hx     Social History   Social History Narrative    Lives with wife,married on August 2020-been together 7 years.Lives with wife and 1 son.Works at [employer].   Social History   Tobacco Use  . Smoking status: Former Smoker    Types: Cigarettes  . Smokeless tobacco: Former Systems developer  . Tobacco comment: socially smokes now, typically 3 times a month "only when I'm drinking"  Substance Use Topics  . Alcohol use: Yes    Comment:  occasionally     Current Meds  Medication Sig  . benzonatate (TESSALON) 100 MG capsule Take 1 capsule (100 mg total) by mouth every 8 (eight) hours.  . Cholecalciferol (VITAMIN D-3) 125 MCG (5000 UT) TABS Take 2 tablets by mouth daily.   . cyclobenzaprine (FLEXERIL) 5 MG tablet Take 1 tablet (5 mg total) by mouth 3 (three) times daily as needed for muscle spasms. (Patient taking differently: Take by  mouth as needed for muscle spasms.)  . loratadine (CLARITIN) 10 MG tablet Take 1 tablet (10 mg total) by mouth daily. (Patient taking differently: Take 10 mg by mouth daily. As needed)  . methocarbamol (ROBAXIN) 500 MG tablet Take 500 mg by mouth 3 (three) times daily.  . NP THYROID 90 MG tablet Take 1 tablet (90 mg total) by mouth daily.  . [DISCONTINUED] sertraline (ZOLOFT) 25 MG tablet Take 25 mg by mouth daily.      Depression screen [facility] 2/9 02/14/2020 10/04/2019  Decreased Interest 0 0  Down, Depressed, Hopeless 0 0  PHQ - 2 Score 0 0  Some encounter information is confidential and restricted. Go to Review Flowsheets activity to see all data.     Objective:   Today's Vitals: BP 132/78 (BP Location: Left Arm, Patient Position: Sitting, Cuff Size: Normal)   Pulse 88   Temp 97.9 F (36.6 C) (Temporal)   Resp 18   Ht 5' 7" (1.702 m)   Wt 185 lb (83.9 kg)   SpO2 96%   BMI 28.98 kg/m  Vitals with BMI 05/24/2020 04/08/2020 02/14/2020  Height 5' 7" 5' 7" 5' 6"  Weight 185 lbs 185 lbs 196 lbs  BMI 28.97 36.46 80.32  Systolic 122 482 500  Diastolic 78 83 72  Pulse 88 79 85  Some encounter information is confidential and restricted. Go to Review Flowsheets activity to see all data.     Physical Exam He looks systemically well.  No new physical findings today.      Assessment   1. Malaise and fatigue   2. Vitamin D deficiency disease   3. Anxiety       Tests ordered No orders of the defined types were placed in this encounter.    Plan: 1. I have refilled his Zoloft for his anxiety. 2. He will continue with vitamin D3 supplementation for vitamin D deficiency. 3. I recommended that we try to discontinue NP thyroid to see how he feels.  He will do this. 4. Follow-up in 3 months.  I strongly recommended that he get COVID-19 vaccination.  Influenza vaccination was given today.   Meds ordered this encounter  Medications  . sertraline (ZOLOFT) 25 MG tablet    Sig:  Take 1 tablet (25 mg total) by mouth daily.    Dispense:  90 tablet    Refill:  0    Nimish Luther Parody, MD

## 2020-06-08 ENCOUNTER — Encounter (INDEPENDENT_AMBULATORY_CARE_PROVIDER_SITE_OTHER): Payer: Self-pay | Admitting: Internal Medicine

## 2020-06-12 ENCOUNTER — Encounter: Payer: Self-pay | Admitting: Gastroenterology
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
## 2020-06-13 ENCOUNTER — Encounter (INDEPENDENT_AMBULATORY_CARE_PROVIDER_SITE_OTHER): Payer: Self-pay | Admitting: Internal Medicine

## 2020-06-13 ENCOUNTER — Encounter: Payer: Self-pay | Admitting: Gastroenterology

## 2020-06-13 ENCOUNTER — Encounter: Payer: Self-pay | Admitting: *Deleted

## 2020-06-13 ENCOUNTER — Ambulatory Visit: Payer: Medicare Other | Admitting: Gastroenterology

## 2020-06-13 ENCOUNTER — Other Ambulatory Visit: Payer: Self-pay

## 2020-06-13 VITALS — BP 117/74 | HR 60 | Temp 97.0°F | Ht 66.0 in | Wt 193.8 lb

## 2020-06-13 DIAGNOSIS — K219 Gastro-esophageal reflux disease without esophagitis: Secondary | ICD-10-CM | POA: Insufficient documentation

## 2020-06-13 DIAGNOSIS — R197 Diarrhea, unspecified: Secondary | ICD-10-CM

## 2020-06-13 MED ORDER — PANTOPRAZOLE SODIUM 40 MG PO TBEC: 40.0000 mg | DELAYED_RELEASE_TABLET | Freq: Every day | ORAL | 3 refills | Status: DC

## 2020-06-13 MED ORDER — DICYCLOMINE HCL 10 MG PO CAPS: 10.0000 mg | ORAL_CAPSULE | Freq: Three times a day (TID) | ORAL | 3 refills | Status: DC

## 2020-06-13 NOTE — Patient Instructions (Signed)
We will arrange for you to have a colonoscopy in the near future with Dr. Jena Gauss.  Please have blood work completed to check for celiac disease.  As you are having this completed with primary care, please asked to have lab results faxed to our office.  For now, stop imodium and start dicyclomine 10 mg up to 3 times daily before meals and at bedtime.  You may start with once daily and increase as needed.  Follow a low-fat diet: Avoid fried, fatty, greasy foods. All meats should be lean (poultry or fish) and baked, boiled, broiled, grilled.  Start Protonix 40 mg daily 30 minutes before breakfast to help with your acid reflux symptoms.  We will plan to see you back in the office after your colonoscopy.  Do not hesitate to call if you have questions or concerns prior.  It was nice to meet you today!  Ermalinda Memos, PA-C [facility]

## 2020-06-13 NOTE — Assessment & Plan Note (Addendum)
Chronic history of GERD, not on PPI.  Reports symptoms occurring at least 3 days a week.  Suspect this is likely influenced by dietary habits as he is eating fast foods at the least once daily. Denies dysphagia.   Plan:  Start Protonix 40 mg daily 30 minutes before breakfast. Counseled on GERD diet/lifestyle.  Separate instructions were provided. Discussed the importance of limiting fast foods. Follow-up after colonoscopy for further evaluation of diarrhea discussed below.
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
## 2020-06-14 ENCOUNTER — Telehealth: Payer: Self-pay | Admitting: Gastroenterology

## 2020-06-14 NOTE — Telephone Encounter (Signed)
Juan [name], can we provide patient with a generic work note asking to excuse patient for frequent trips to the bathroom due to medical reasons? Aside from needing access to a bathroom with ease, patient has no restrictions. Please include in the note that we are evaluating his condition further in order to improve/resolve his symptoms.    Please let patient know when note is ready to pick up.

## 2020-06-14 NOTE — Telephone Encounter (Signed)
Noted. Letter is ready for pickup. Lmom, letting pt know letter is ready for pickup.

## 2020-06-15 DIAGNOSIS — R197 Diarrhea, unspecified: Secondary | ICD-10-CM | POA: Diagnosis not present

## 2020-06-18 LAB — IGA: Immunoglobulin A: 173 mg/dL (ref 47–310)

## 2020-06-18 LAB — TISSUE TRANSGLUTAMINASE, IGA: (tTG) Ab, IgA: <1 U/mL

## 2020-06-27 ENCOUNTER — Telehealth: Payer: Self-pay | Admitting: Internal Medicine

## 2020-06-27 ENCOUNTER — Other Ambulatory Visit (INDEPENDENT_AMBULATORY_CARE_PROVIDER_SITE_OTHER): Payer: Self-pay | Admitting: Internal Medicine

## 2020-06-27 DIAGNOSIS — Z20822 Contact with and (suspected) exposure to covid-19: Secondary | ICD-10-CM | POA: Diagnosis not present

## 2020-06-27 NOTE — Telephone Encounter (Signed)
LMOVM for endo scheduler to call office.

## 2020-06-27 NOTE — Telephone Encounter (Signed)
[phone] patient wife called and said he took a covid home test and it is positive.  Does he need to reschedule his procedure on the 18th

## 2020-06-27 NOTE — Telephone Encounter (Signed)
Endo scheduler advised pt would need PCR result. If PCR result is positive no retest for 90 days.  Called pt's wife, he went to [facility] today and was tested. Should get result within 3 days. Advised her if he's positive to bring result to our office or have [facility] fax result to office so it can be scanned into chart. TCS rescheduled to 08/02/20--AM. Endo scheduler informed. New instructions mailed.

## 2020-07-31 ENCOUNTER — Telehealth: Payer: Self-pay

## 2020-07-31 ENCOUNTER — Encounter (HOSPITAL_COMMUNITY): Payer: Self-pay | Admitting: Internal Medicine

## 2020-07-31 NOTE — Telephone Encounter (Signed)
Melanie at [facility] endo called office, they have been unable to reach pt to give arrival time for 08/02/20 procedure. Arrival time is 8:30am. Have also not received covid test result.  Spoke to pt's wife, he tested positive for covid at [employer]. She will contact [facility] for result. Gave her office fax#. Informed wife for him to arrive at 8:30am 08/02/20 for procedure.

## 2020-08-01 NOTE — Telephone Encounter (Signed)
Melanie at endo called office, covid test result hasn't been received. If result not received by 12:00pm today, procedure will be cancelled.  Called pt's wife, she said he didn't go to [facility] yesterday to get paper but will go today. Advised her if result isn't received today by 12:00pm the procedure will be cancelled.   Melanie informed.

## 2020-08-01 NOTE — Telephone Encounter (Signed)
Received positive covid test result 06/28/20 from [facility]. Result emailed to [facility] at endo. Result to be scanned into chart.  Melanie informed.   Called and informed wife result was received and may proceed with procedure for tomorrow.

## 2020-08-02 ENCOUNTER — Ambulatory Visit (HOSPITAL_COMMUNITY): Payer: Medicare Other | Admitting: Anesthesiology

## 2020-08-02 ENCOUNTER — Encounter (HOSPITAL_COMMUNITY)
Admission: RE | Disposition: A | Discharge status: Home / Self Care | Payer: Self-pay | Source: Home / Self Care | Attending: Internal Medicine

## 2020-08-02 ENCOUNTER — Ambulatory Visit (HOSPITAL_COMMUNITY)
Admission: RE | Admit: 2020-08-02 | Discharge: 2020-08-02 | Disposition: A | Discharge status: Home / Self Care | Payer: Medicare Other | Attending: Internal Medicine | Admitting: Internal Medicine

## 2020-08-02 ENCOUNTER — Encounter (HOSPITAL_COMMUNITY): Payer: Self-pay | Admitting: Internal Medicine

## 2020-08-02 ENCOUNTER — Other Ambulatory Visit: Payer: Self-pay

## 2020-08-02 DIAGNOSIS — E039 Hypothyroidism, unspecified: Secondary | ICD-10-CM | POA: Diagnosis not present

## 2020-08-02 DIAGNOSIS — R197 Diarrhea, unspecified: Secondary | ICD-10-CM | POA: Diagnosis not present

## 2020-08-02 DIAGNOSIS — Z560 Unemployment, unspecified: Secondary | ICD-10-CM | POA: Insufficient documentation

## 2020-08-02 DIAGNOSIS — Z8249 Family history of ischemic heart disease and other diseases of the circulatory system: Secondary | ICD-10-CM | POA: Diagnosis not present

## 2020-08-02 DIAGNOSIS — Z809 Family history of malignant neoplasm, unspecified: Secondary | ICD-10-CM | POA: Diagnosis not present

## 2020-08-02 DIAGNOSIS — Z79899 Other long term (current) drug therapy: Secondary | ICD-10-CM | POA: Insufficient documentation

## 2020-08-02 DIAGNOSIS — Z87891 Personal history of nicotine dependence: Secondary | ICD-10-CM | POA: Insufficient documentation

## 2020-08-02 DIAGNOSIS — K529 Noninfective gastroenteritis and colitis, unspecified: Secondary | ICD-10-CM | POA: Diagnosis not present

## 2020-08-02 DIAGNOSIS — Z8669 Personal history of other diseases of the nervous system and sense organs: Secondary | ICD-10-CM | POA: Diagnosis not present

## 2020-08-02 HISTORY — PX: BIOPSY: SHX5522

## 2020-08-02 HISTORY — PX: COLONOSCOPY WITH PROPOFOL: SHX5780

## 2020-08-02 SURGERY — COLONOSCOPY WITH PROPOFOL
Anesthesia: General

## 2020-08-02 MED ORDER — STERILE WATER FOR IRRIGATION IR SOLN
Status: DC | PRN
  Administered 2020-08-02: 100 mL

## 2020-08-02 MED ORDER — LIDOCAINE HCL (CARDIAC) PF 100 MG/5ML IV SOSY
PREFILLED_SYRINGE | INTRAVENOUS | Status: DC | PRN
  Administered 2020-08-02: 50 mg via INTRAVENOUS

## 2020-08-02 MED ORDER — MIDAZOLAM HCL 2 MG/2ML IJ SOLN
INTRAMUSCULAR | Status: DC | PRN
  Administered 2020-08-02: 2 mg via INTRAVENOUS

## 2020-08-02 MED ORDER — MIDAZOLAM HCL 2 MG/2ML IJ SOLN
INTRAMUSCULAR | Status: AC
  Filled 2020-08-02: qty 2

## 2020-08-02 MED ORDER — PROPOFOL 10 MG/ML IV BOLUS
INTRAVENOUS | Status: DC | PRN
  Administered 2020-08-02 (×2): 100 mg via INTRAVENOUS

## 2020-08-02 MED ORDER — PROPOFOL 500 MG/50ML IV EMUL
INTRAVENOUS | Status: DC | PRN
  Administered 2020-08-02: 150 ug/kg/min via INTRAVENOUS

## 2020-08-02 MED ORDER — LACTATED RINGERS IV SOLN: INTRAVENOUS | Status: DC
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
## 2020-08-02 NOTE — Transfer of Care (Signed)
Immediate Anesthesia Transfer of Care Note  Patient: Juan [name]  Procedure(s) Performed: COLONOSCOPY WITH PROPOFOL (N/A ) BIOPSY  Patient Location: Endoscopy Unit  Anesthesia Type:General  Level of Consciousness: drowsy  Airway & Oxygen Therapy: Patient Spontanous Breathing  Post-op Assessment: Report given to RN and Post -op Vital signs reviewed and stable  Post vital signs: Reviewed and stable  Last Vitals:  Vitals Value Taken Time  BP    Temp    Pulse    Resp    SpO2      Last Pain:  Vitals:   08/02/20 0948  TempSrc:   PainSc: 0-No pain      Patients Stated Pain Goal: 3 (08/02/20 0845)  Complications: No complications documented.

## 2020-08-02 NOTE — Discharge Instructions (Signed)
Colonoscopy Discharge Instructions  Read the instructions outlined below and refer to this sheet in the next few weeks. These discharge instructions provide you with general information on caring for yourself after you leave the hospital. Your doctor may also give you specific instructions. While your treatment has been planned according to the most current medical practices available, unavoidable complications occasionally occur. If you have any problems or questions after discharge, call Dr. Jena Gauss at [phone]. ACTIVITY  You may resume your regular activity, but move at a slower pace for the next 24 hours.   Take frequent rest periods for the next 24 hours.   Walking will help get rid of the air and reduce the bloated feeling in your belly (abdomen).   No driving for 24 hours (because of the medicine (anesthesia) used during the test).    Do not sign any important legal documents or operate any machinery for 24 hours (because of the anesthesia used during the test).  NUTRITION  Drink plenty of fluids.   You may resume your normal diet as instructed by your doctor.   Begin with a light meal and progress to your normal diet. Heavy or fried foods are harder to digest and may make you feel sick to your stomach (nauseated).   Avoid alcoholic beverages for 24 hours or as instructed.  MEDICATIONS  You may resume your normal medications unless your doctor tells you otherwise.  WHAT YOU CAN EXPECT TODAY  Some feelings of bloating in the abdomen.   Passage of more gas than usual.   Spotting of blood in your stool or on the toilet paper.  IF YOU HAD POLYPS REMOVED DURING THE COLONOSCOPY:  No aspirin products for 7 days or as instructed.   No alcohol for 7 days or as instructed.   Eat a soft diet for the next 24 hours.  FINDING OUT THE RESULTS OF YOUR TEST Not all test results are available during your visit. If your test results are not back during the visit, make an appointment  with your caregiver to find out the results. Do not assume everything is normal if you have not heard from your caregiver or the medical facility. It is important for you to follow up on all of your test results.  SEEK IMMEDIATE MEDICAL ATTENTION IF:  You have more than a spotting of blood in your stool.   Your belly is swollen (abdominal distention).   You are nauseated or vomiting.   You have a temperature over 101.   You have abdominal pain or discomfort that is severe or gets worse throughout the day.   Your colon appeared normal today.  Biopsies were taken.  The recommendations to follow pending review of pathology report  Office visit with Ermalinda Memos in 4 weeks  At patient request, I called Sonia Side at [phone] -reviewed findings and recommendations  PATIENT INSTRUCTIONS POST-ANESTHESIA  IMMEDIATELY FOLLOWING SURGERY:  Do not drive or operate machinery for the first twenty four hours after surgery.  Do not make any important decisions for twenty four hours after surgery or while taking narcotic pain medications or sedatives.  If you develop intractable nausea and vomiting or a severe headache please notify your doctor immediately.  FOLLOW-UP:  Please make an appointment with your surgeon as instructed. You do not need to follow up with anesthesia unless specifically instructed to do so.  WOUND CARE INSTRUCTIONS (if applicable):  Keep a dry clean dressing on the anesthesia/puncture wound site if there is drainage.  Once the wound has quit draining you may leave it open to air.  Generally you should leave the bandage intact for twenty four hours unless there is drainage.  If the epidural site drains for more than 36-48 hours please call the anesthesia department.  QUESTIONS?:  Please feel free to call your physician or the hospital operator if you have any questions, and they will be happy to assist you.

## 2020-08-02 NOTE — Op Note (Signed)
[facility] Patient Name: Juan [name] Procedure Date: 08/02/2020 9:25 AM MRN: [id number] Date of Birth: 1991-01-16 Attending MD: Gennette Pac , MD CSN: [id number] Age: 30 Admit Type: Outpatient Procedure:                Colonoscopy Indications:              Chronic diarrhea Providers:                Gennette Pac, MD, Sheran Fava,                            Pandora Leiter, Technician Referring MD:              Medicines:                Propofol per Anesthesia Complications:            No immediate complications. Estimated Blood Loss:     Estimated blood loss was minimal. Procedure:                Pre-Anesthesia Assessment:                           - Prior to the procedure, a History and Physical                            was performed, and patient medications and                            allergies were reviewed. The patient's tolerance of                            previous anesthesia was also reviewed. The risks                            and benefits of the procedure and the sedation                            options and risks were discussed with the patient.                            All questions were answered, and informed consent                            was obtained. Prior Anticoagulants: The patient has                            taken no previous anticoagulant or antiplatelet                            agents. ASA Grade Assessment: II - A patient with                            mild systemic disease. After reviewing the risks  and benefits, the patient was deemed in                            satisfactory condition to undergo the procedure.                           After obtaining informed consent, the colonoscope                            was passed under direct vision. Throughout the                            procedure, the patient's blood pressure, pulse, and                            oxygen saturations were  monitored continuously. The                            CF-HQ190L ([id number]) scope was introduced through                            the anus and advanced to the the cecum, identified                            by appendiceal orifice and ileocecal valve. The                            colonoscopy was performed without difficulty. The                            patient tolerated the procedure well. The quality                            of the bowel preparation was adequate. Scope In: 9:51:46 AM Scope Out: 10:02:53 AM Scope Withdrawal Time: 0 hours 8 minutes 21 seconds  Total Procedure Duration: 0 hours 11 minutes 7 seconds  Findings:      The perianal and digital rectal examinations were normal.      The colon (entire examined portion) appeared normal. Distal 25 cm of TI       appeared normal.      The retroflexed view of the distal rectum and anal verge was normal and       showed no anal or rectal abnormalities. Segmental biopsies of the right       and left colon taken for histologic study Impression:               - The entire examined colon is normal. We will                            appearing terminal ileum.                           - The distal rectum and anal verge are normal on  retroflexion view.                           Status post segmental biopsy. Moderate Sedation:      Moderate (conscious) sedation was personally administered by an       anesthesia professional. The following parameters were monitored: oxygen       saturation, heart rate, blood pressure, respiratory rate, EKG, adequacy       of pulmonary ventilation, and response to care. Recommendation:           - Patient has a contact number available for                            emergencies. The signs and symptoms of potential                            delayed complications were discussed with the                            patient. Return to normal activities tomorrow.                             Written discharge instructions were provided to the                            patient.                           - Resume previous diet.                           - Continue present medications.                           - Repeat colonoscopy date to be determined after                            pending pathology results are reviewed for                            surveillance based on pathology results.                           - Return to GI office in 1 month. Might consider a                            CTE as an outpatient to rule out occult small bowel                            fistula related to prior PEG placement as a                            contributing factor to area. Procedure Code(s):        --- Professional ---  [id number], Colonoscopy, flexible; diagnostic, including                            collection of specimen(s) by brushing or washing,                            when performed (separate procedure) Diagnosis Code(s):        --- Professional ---                           K52.9, Noninfective gastroenteritis and colitis,                            unspecified CPT copyright 2019 American Medical Association. All rights reserved. The codes documented in this report are preliminary and upon coder review may  be revised to meet current compliance requirements. Gerrit Friendsobert M. Tremane Spurgeon, MD Gennette Pacobert Michael Albertina Leise, MD 08/02/2020 10:35:01 AM This report has been signed electronically. Number of Addenda: 0

## 2020-08-02 NOTE — Anesthesia Procedure Notes (Signed)
Date/Time: 08/02/2020 9:58 AM Performed by: Julian Reil, CRNA Pre-anesthesia Checklist: Patient identified, Emergency Drugs available, Suction available and Patient being monitored Patient Re-evaluated:Patient Re-evaluated prior to induction Oxygen Delivery Method: Nasal cannula Induction Type: IV induction Placement Confirmation: positive ETCO2

## 2020-08-02 NOTE — Progress Notes (Signed)
Juan [name] had a medical procedure on 08/02/2020 requiring anesthesia.  He may return to work on 08/03/2020 after 11am.  Virgie Dad RN [facility]

## 2020-08-02 NOTE — Anesthesia Postprocedure Evaluation (Signed)
Anesthesia Post Note  Patient: Juan [name]  Procedure(s) Performed: COLONOSCOPY WITH PROPOFOL (N/A ) BIOPSY  Patient location during evaluation: Endoscopy Anesthesia Type: General Level of consciousness: awake and alert and oriented Pain management: pain level controlled Vital Signs Assessment: post-procedure vital signs reviewed and stable Respiratory status: spontaneous breathing, nonlabored ventilation and respiratory function stable Cardiovascular status: blood pressure returned to baseline and stable Postop Assessment: no apparent nausea or vomiting Anesthetic complications: no   No complications documented.   Last Vitals:  Vitals:   08/02/20 0856 08/02/20 1009  BP: 117/88 (!) 98/49  Pulse: 79 76  Resp: 20 20  Temp: 36.8 C   SpO2: 99% 96%    Last Pain:  Vitals:   08/02/20 1009  TempSrc: Oral  PainSc: 0-No pain                 Julian Reil
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
## 2020-08-03 ENCOUNTER — Encounter: Payer: Self-pay | Admitting: Internal Medicine

## 2020-08-03 LAB — SURGICAL PATHOLOGY

## 2020-08-10 ENCOUNTER — Encounter (HOSPITAL_COMMUNITY): Payer: Self-pay | Admitting: Internal Medicine

## 2020-08-23 ENCOUNTER — Other Ambulatory Visit (INDEPENDENT_AMBULATORY_CARE_PROVIDER_SITE_OTHER): Payer: Self-pay | Admitting: Internal Medicine

## 2020-08-23 ENCOUNTER — Ambulatory Visit (INDEPENDENT_AMBULATORY_CARE_PROVIDER_SITE_OTHER): Payer: Medicare Other | Admitting: Internal Medicine

## 2020-09-13 ENCOUNTER — Ambulatory Visit: Payer: Medicare Other | Admitting: Gastroenterology
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
## 2020-12-05 ENCOUNTER — Ambulatory Visit: Payer: Medicare Other | Admitting: Gastroenterology

## 2020-12-05 ENCOUNTER — Encounter: Payer: Self-pay | Admitting: Internal Medicine

## 2021-01-10 ENCOUNTER — Encounter: Payer: Self-pay | Admitting: Internal Medicine

## 2021-01-30 DIAGNOSIS — Z0000 Encounter for general adult medical examination without abnormal findings: Secondary | ICD-10-CM | POA: Diagnosis not present

## 2021-01-30 DIAGNOSIS — Z8639 Personal history of other endocrine, nutritional and metabolic disease: Secondary | ICD-10-CM | POA: Diagnosis not present

## 2021-01-30 DIAGNOSIS — Z87820 Personal history of traumatic brain injury: Secondary | ICD-10-CM | POA: Diagnosis not present

## 2021-02-25 ENCOUNTER — Encounter (INDEPENDENT_AMBULATORY_CARE_PROVIDER_SITE_OTHER): Payer: Medicare Other | Admitting: Nurse Practitioner

## 2021-03-11 DIAGNOSIS — Z13220 Encounter for screening for lipoid disorders: Secondary | ICD-10-CM | POA: Diagnosis not present

## 2021-03-11 DIAGNOSIS — Z87820 Personal history of traumatic brain injury: Secondary | ICD-10-CM | POA: Diagnosis not present

## 2021-03-11 DIAGNOSIS — E559 Vitamin D deficiency, unspecified: Secondary | ICD-10-CM | POA: Diagnosis not present

## 2021-03-11 DIAGNOSIS — Z8639 Personal history of other endocrine, nutritional and metabolic disease: Secondary | ICD-10-CM | POA: Diagnosis not present

## 2021-03-11 DIAGNOSIS — Z131 Encounter for screening for diabetes mellitus: Secondary | ICD-10-CM | POA: Diagnosis not present

## 2021-03-23 DIAGNOSIS — U071 COVID-19: Secondary | ICD-10-CM | POA: Diagnosis not present

## 2021-04-16 DIAGNOSIS — M791 Myalgia, unspecified site: Secondary | ICD-10-CM | POA: Diagnosis not present

## 2021-05-23 DIAGNOSIS — K529 Noninfective gastroenteritis and colitis, unspecified: Secondary | ICD-10-CM | POA: Diagnosis not present

## 2021-06-18 DIAGNOSIS — S46819A Strain of other muscles, fascia and tendons at shoulder and upper arm level, unspecified arm, initial encounter: Secondary | ICD-10-CM | POA: Diagnosis not present

## 2021-09-02 DIAGNOSIS — I10 Essential (primary) hypertension: Secondary | ICD-10-CM | POA: Diagnosis not present

## 2021-09-02 DIAGNOSIS — R112 Nausea with vomiting, unspecified: Secondary | ICD-10-CM | POA: Diagnosis not present

## 2021-09-02 DIAGNOSIS — R109 Unspecified abdominal pain: Secondary | ICD-10-CM | POA: Diagnosis not present

## 2021-09-02 DIAGNOSIS — R197 Diarrhea, unspecified: Secondary | ICD-10-CM | POA: Diagnosis not present

## 2021-09-06 DIAGNOSIS — R109 Unspecified abdominal pain: Secondary | ICD-10-CM | POA: Diagnosis not present

## 2021-09-06 DIAGNOSIS — R197 Diarrhea, unspecified: Secondary | ICD-10-CM | POA: Diagnosis not present

## 2021-09-06 DIAGNOSIS — R1110 Vomiting, unspecified: Secondary | ICD-10-CM | POA: Diagnosis not present

## 2021-09-06 DIAGNOSIS — R112 Nausea with vomiting, unspecified: Secondary | ICD-10-CM | POA: Diagnosis not present

## 2021-09-13 DIAGNOSIS — K859 Acute pancreatitis without necrosis or infection, unspecified: Secondary | ICD-10-CM | POA: Diagnosis not present

## 2021-09-13 DIAGNOSIS — R112 Nausea with vomiting, unspecified: Secondary | ICD-10-CM | POA: Diagnosis not present

## 2021-09-13 DIAGNOSIS — R109 Unspecified abdominal pain: Secondary | ICD-10-CM | POA: Diagnosis not present

## 2021-09-13 DIAGNOSIS — I10 Essential (primary) hypertension: Secondary | ICD-10-CM | POA: Diagnosis not present

## 2021-09-13 DIAGNOSIS — R197 Diarrhea, unspecified: Secondary | ICD-10-CM | POA: Diagnosis not present

## 2021-09-25 DIAGNOSIS — S72352D Displaced comminuted fracture of shaft of left femur, subsequent encounter for closed fracture with routine healing: Secondary | ICD-10-CM | POA: Diagnosis not present

## 2021-09-25 DIAGNOSIS — S82251F Displaced comminuted fracture of shaft of right tibia, subsequent encounter for open fracture type IIIA, IIIB, or IIIC with routine healing: Secondary | ICD-10-CM | POA: Diagnosis not present

## 2021-09-25 DIAGNOSIS — T8484XA Pain due to internal orthopedic prosthetic devices, implants and grafts, initial encounter: Secondary | ICD-10-CM | POA: Diagnosis not present

## 2021-10-17 MED ORDER — DIPHENHYDRAMINE HCL 50 MG/ML IJ SOLN: 50.0000 mg | Freq: Once | INTRAMUSCULAR | Status: DC | PRN

## 2021-10-17 MED ORDER — SOTROVIMAB 500 MG/8ML IV SOLN: 500.0000 mg | Freq: Once | INTRAVENOUS | Status: DC

## 2021-10-17 MED ORDER — ALBUTEROL SULFATE HFA 108 (90 BASE) MCG/ACT IN AERS: 2.0000 | INHALATION_SPRAY | Freq: Once | RESPIRATORY_TRACT | Status: DC | PRN

## 2021-10-17 MED ORDER — FAMOTIDINE IN NACL 20-0.9 MG/50ML-% IV SOLN: 20.0000 mg | Freq: Once | INTRAVENOUS | Status: DC | PRN

## 2021-10-17 MED ORDER — SODIUM CHLORIDE 0.9 % IV SOLN: INTRAVENOUS | Status: DC | PRN

## 2021-10-17 MED ORDER — METHYLPREDNISOLONE SODIUM SUCC 125 MG IJ SOLR: 125.0000 mg | Freq: Once | INTRAMUSCULAR | Status: DC | PRN

## 2021-10-17 MED ORDER — EPINEPHRINE 0.3 MG/0.3ML IJ SOAJ: 0.3000 mg | Freq: Once | INTRAMUSCULAR | Status: DC | PRN

## 2021-10-18 ENCOUNTER — Encounter (HOSPITAL_COMMUNITY): Payer: Self-pay | Admitting: Orthopedic Surgery

## 2021-10-18 ENCOUNTER — Other Ambulatory Visit: Payer: Self-pay

## 2021-10-18 NOTE — Progress Notes (Signed)
Mr. Juan [name] denies chest pain or shortness of breath.  Patient denies having any s/s of Covid in his household.  Patient denies any known exposure to Covid.    Mr. Juan [name] PCP is Eyvonne Left.  I instructed Mr. Whittington to not take Testerone supplement starting today.
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
## 2021-10-22 ENCOUNTER — Ambulatory Visit (HOSPITAL_COMMUNITY)
Admission: RE | Admit: 2021-10-22 | Discharge: 2021-10-22 | Disposition: A | Discharge status: Home / Self Care | Payer: Medicare Other | Attending: Orthopedic Surgery | Admitting: Orthopedic Surgery

## 2021-10-22 ENCOUNTER — Ambulatory Visit (HOSPITAL_BASED_OUTPATIENT_CLINIC_OR_DEPARTMENT_OTHER): Payer: Medicare Other | Admitting: Certified Registered"

## 2021-10-22 ENCOUNTER — Encounter (HOSPITAL_COMMUNITY)
Admission: RE | Disposition: A | Discharge status: Home / Self Care | Payer: Self-pay | Source: Home / Self Care | Attending: Orthopedic Surgery

## 2021-10-22 ENCOUNTER — Ambulatory Visit (HOSPITAL_COMMUNITY): Payer: Medicare Other

## 2021-10-22 ENCOUNTER — Other Ambulatory Visit: Payer: Self-pay

## 2021-10-22 ENCOUNTER — Ambulatory Visit (HOSPITAL_COMMUNITY): Payer: Medicare Other | Admitting: Certified Registered"

## 2021-10-22 ENCOUNTER — Encounter (HOSPITAL_COMMUNITY): Payer: Self-pay | Admitting: Orthopedic Surgery

## 2021-10-22 DIAGNOSIS — F1290 Cannabis use, unspecified, uncomplicated: Secondary | ICD-10-CM | POA: Diagnosis not present

## 2021-10-22 DIAGNOSIS — K219 Gastro-esophageal reflux disease without esophagitis: Secondary | ICD-10-CM | POA: Diagnosis not present

## 2021-10-22 DIAGNOSIS — Z87891 Personal history of nicotine dependence: Secondary | ICD-10-CM | POA: Diagnosis not present

## 2021-10-22 DIAGNOSIS — E039 Hypothyroidism, unspecified: Secondary | ICD-10-CM | POA: Insufficient documentation

## 2021-10-22 DIAGNOSIS — Y831 Surgical operation with implant of artificial internal device as the cause of abnormal reaction of the patient, or of later complication, without mention of misadventure at the time of the procedure: Secondary | ICD-10-CM | POA: Diagnosis not present

## 2021-10-22 DIAGNOSIS — U071 COVID-19: Secondary | ICD-10-CM

## 2021-10-22 DIAGNOSIS — T8484XA Pain due to internal orthopedic prosthetic devices, implants and grafts, initial encounter: Secondary | ICD-10-CM | POA: Insufficient documentation

## 2021-10-22 DIAGNOSIS — M25561 Pain in right knee: Secondary | ICD-10-CM | POA: Diagnosis not present

## 2021-10-22 DIAGNOSIS — M25572 Pain in left ankle and joints of left foot: Secondary | ICD-10-CM | POA: Diagnosis not present

## 2021-10-22 DIAGNOSIS — Z472 Encounter for removal of internal fixation device: Secondary | ICD-10-CM | POA: Diagnosis not present

## 2021-10-22 DIAGNOSIS — M25562 Pain in left knee: Secondary | ICD-10-CM | POA: Diagnosis not present

## 2021-10-22 DIAGNOSIS — M79652 Pain in left thigh: Secondary | ICD-10-CM | POA: Diagnosis not present

## 2021-10-22 HISTORY — DX: Personal history of other medical treatment: Z92.89

## 2021-10-22 HISTORY — PX: HARDWARE REMOVAL: SHX979

## 2021-10-22 LAB — CBC
HCT: 40.7 % (ref 39.0–52.0)
Hemoglobin: 13.5 g/dL (ref 13.0–17.0)
MCH: 29.5 pg (ref 26.0–34.0)
MCHC: 33.2 g/dL (ref 30.0–36.0)
MCV: 89.1 fL (ref 80.0–100.0)
Platelets: 204 10*3/uL (ref 150–400)
RBC: 4.57 MIL/uL (ref 4.22–5.81)
RDW: 12.7 % (ref 11.5–15.5)
WBC: 5.7 10*3/uL (ref 4.0–10.5)
nRBC: 0 % (ref 0.0–0.2)

## 2021-10-22 LAB — C-REACTIVE PROTEIN: CRP: 0.6 mg/dL (ref <1.0)

## 2021-10-22 LAB — SEDIMENTATION RATE: Sed Rate: 2 mm/hr (ref 0–16)

## 2021-10-22 SURGERY — REMOVAL, HARDWARE
Anesthesia: General | Laterality: Bilateral

## 2021-10-22 MED ORDER — ACETAMINOPHEN 10 MG/ML IV SOLN
INTRAVENOUS | Status: AC
  Filled 2021-10-22: qty 100

## 2021-10-22 MED ORDER — METHOCARBAMOL 500 MG PO TABS: 500.0000 mg | ORAL_TABLET | Freq: Four times a day (QID) | ORAL | 0 refills | Status: DC | PRN

## 2021-10-22 MED ORDER — CHLORHEXIDINE GLUCONATE 0.12 % MT SOLN: 15.0000 mL | Freq: Once | OROMUCOSAL | Status: AC

## 2021-10-22 MED ORDER — LACTATED RINGERS IV SOLN: INTRAVENOUS | Status: DC

## 2021-10-22 MED ORDER — HYDROMORPHONE HCL 1 MG/ML IJ SOLN
INTRAMUSCULAR | Status: AC
  Filled 2021-10-22: qty 0.5

## 2021-10-22 MED ORDER — CEFAZOLIN SODIUM-DEXTROSE 2-4 GM/100ML-% IV SOLN
2.0000 g | INTRAVENOUS | Status: AC
  Administered 2021-10-22: 2 g via INTRAVENOUS
  Filled 2021-10-22: qty 100

## 2021-10-22 MED ORDER — OXYCODONE-ACETAMINOPHEN 5-325 MG PO TABS: 1.0000 | ORAL_TABLET | Freq: Four times a day (QID) | ORAL | 0 refills | Status: DC | PRN

## 2021-10-22 MED ORDER — ACETAMINOPHEN 160 MG/5ML PO SOLN: 325.0000 mg | Freq: Once | ORAL | Status: DC | PRN

## 2021-10-22 MED ORDER — AMISULPRIDE (ANTIEMETIC) 5 MG/2ML IV SOLN: 10.0000 mg | Freq: Once | INTRAVENOUS | Status: DC | PRN

## 2021-10-22 MED ORDER — MIDAZOLAM HCL 2 MG/2ML IJ SOLN
INTRAMUSCULAR | Status: AC
  Filled 2021-10-22: qty 2

## 2021-10-22 MED ORDER — FENTANYL CITRATE (PF) 250 MCG/5ML IJ SOLN
INTRAMUSCULAR | Status: DC | PRN
  Administered 2021-10-22 (×2): 50 ug via INTRAVENOUS
  Administered 2021-10-22: 25 ug via INTRAVENOUS
  Administered 2021-10-22 (×2): 50 ug via INTRAVENOUS

## 2021-10-22 MED ORDER — ACETAMINOPHEN 10 MG/ML IV SOLN: 1000.0000 mg | Freq: Once | INTRAVENOUS | Status: DC | PRN

## 2021-10-22 MED ORDER — DEXAMETHASONE SODIUM PHOSPHATE 10 MG/ML IJ SOLN
INTRAMUSCULAR | Status: DC | PRN
  Administered 2021-10-22: 10 mg via INTRAVENOUS

## 2021-10-22 MED ORDER — 0.9 % SODIUM CHLORIDE (POUR BTL) OPTIME
TOPICAL | Status: DC | PRN
  Administered 2021-10-22: 1000 mL

## 2021-10-22 MED ORDER — HYDROMORPHONE HCL 1 MG/ML IJ SOLN: 0.2500 mg | INTRAMUSCULAR | Status: DC | PRN

## 2021-10-22 MED ORDER — MIDAZOLAM HCL 2 MG/2ML IJ SOLN
INTRAMUSCULAR | Status: DC | PRN
  Administered 2021-10-22: 2 mg via INTRAVENOUS

## 2021-10-22 MED ORDER — ACETAMINOPHEN 500 MG PO TABS: 1000.0000 mg | ORAL_TABLET | Freq: Three times a day (TID) | ORAL | 0 refills | Status: DC | PRN

## 2021-10-22 MED ORDER — ACETAMINOPHEN 10 MG/ML IV SOLN
INTRAVENOUS | Status: DC | PRN
  Administered 2021-10-22: 1000 mg via INTRAVENOUS

## 2021-10-22 MED ORDER — HYDROMORPHONE HCL 1 MG/ML IJ SOLN
0.2500 mg | INTRAMUSCULAR | Status: DC | PRN
  Administered 2021-10-22 (×2): 0.5 mg via INTRAVENOUS

## 2021-10-22 MED ORDER — ONDANSETRON HCL 4 MG/2ML IJ SOLN
INTRAMUSCULAR | Status: DC | PRN
  Administered 2021-10-22: 4 mg via INTRAVENOUS

## 2021-10-22 MED ORDER — PROPOFOL 10 MG/ML IV BOLUS
INTRAVENOUS | Status: DC | PRN
  Administered 2021-10-22: 20 mg via INTRAVENOUS
  Administered 2021-10-22: 50 mg via INTRAVENOUS
  Administered 2021-10-22: 200 mg via INTRAVENOUS
  Administered 2021-10-22: 20 mg via INTRAVENOUS

## 2021-10-22 MED ORDER — MEPERIDINE HCL 25 MG/ML IJ SOLN: 6.2500 mg | INTRAMUSCULAR | Status: DC | PRN

## 2021-10-22 MED ORDER — LIDOCAINE 2% (20 MG/ML) 5 ML SYRINGE
INTRAMUSCULAR | Status: DC | PRN
  Administered 2021-10-22: 40 mg via INTRAVENOUS

## 2021-10-22 MED ORDER — HYDROMORPHONE HCL 1 MG/ML IJ SOLN
INTRAMUSCULAR | Status: DC | PRN
  Administered 2021-10-22: .5 mg via INTRAVENOUS

## 2021-10-22 MED ORDER — ORAL CARE MOUTH RINSE: 15.0000 mL | Freq: Once | OROMUCOSAL | Status: AC

## 2021-10-22 MED ORDER — ETODOLAC 400 MG PO TABS: 400.0000 mg | ORAL_TABLET | Freq: Two times a day (BID) | ORAL | 0 refills | Status: AC

## 2021-10-22 MED ORDER — ROCURONIUM BROMIDE 10 MG/ML (PF) SYRINGE
PREFILLED_SYRINGE | INTRAVENOUS | Status: DC | PRN
  Administered 2021-10-22: 70 mg via INTRAVENOUS
  Administered 2021-10-22: 10 mg via INTRAVENOUS
  Administered 2021-10-22: 20 mg via INTRAVENOUS

## 2021-10-22 MED ORDER — ONDANSETRON 4 MG PO TBDP: 4.0000 mg | ORAL_TABLET | Freq: Three times a day (TID) | ORAL | 0 refills | Status: DC | PRN

## 2021-10-22 MED ORDER — KETOROLAC TROMETHAMINE 10 MG PO TABS: 10.0000 mg | ORAL_TABLET | Freq: Four times a day (QID) | ORAL | 0 refills | Status: DC | PRN

## 2021-10-22 MED ORDER — SUGAMMADEX SODIUM 200 MG/2ML IV SOLN
INTRAVENOUS | Status: DC | PRN
  Administered 2021-10-22: 200 mg via INTRAVENOUS

## 2021-10-22 MED ORDER — CHLORHEXIDINE GLUCONATE 0.12 % MT SOLN
OROMUCOSAL | Status: AC
  Administered 2021-10-22: 15 mL via OROMUCOSAL
  Filled 2021-10-22: qty 15

## 2021-10-22 MED ORDER — ACETAMINOPHEN 325 MG PO TABS: 325.0000 mg | ORAL_TABLET | Freq: Once | ORAL | Status: DC | PRN

## 2021-10-22 MED ORDER — HYDROMORPHONE HCL 1 MG/ML IJ SOLN
INTRAMUSCULAR | Status: AC
  Filled 2021-10-22: qty 1

## 2021-10-22 MED ORDER — KETOROLAC TROMETHAMINE 15 MG/ML IJ SOLN
INTRAMUSCULAR | Status: DC | PRN
  Administered 2021-10-22: 15 mg via INTRAVENOUS

## 2021-10-22 MED ORDER — FENTANYL CITRATE (PF) 250 MCG/5ML IJ SOLN
INTRAMUSCULAR | Status: AC
  Filled 2021-10-22: qty 5

## 2021-10-22 SURGICAL SUPPLY — 63 items
BAG COUNTER SPONGE SURGICOUNT (BAG) ×2 IMPLANT
BAG SPNG CNTER NS LX DISP (BAG) ×1
BNDG ELASTIC 4X5.8 VLCR STR LF (GAUZE/BANDAGES/DRESSINGS) ×3 IMPLANT
BNDG ELASTIC 6X5.8 VLCR STR LF (GAUZE/BANDAGES/DRESSINGS) ×3 IMPLANT
BNDG GAUZE ELAST 4 BULKY (GAUZE/BANDAGES/DRESSINGS) ×2 IMPLANT
BRUSH SCRUB EZ PLAIN DRY (MISCELLANEOUS) ×4 IMPLANT
COVER SURGICAL LIGHT HANDLE (MISCELLANEOUS) ×4 IMPLANT
CUFF TOURN SGL QUICK 18X4 (TOURNIQUET CUFF) IMPLANT
CUFF TOURN SGL QUICK 24 (TOURNIQUET CUFF)
CUFF TOURN SGL QUICK 34 (TOURNIQUET CUFF)
CUFF TRNQT CYL 24X4X16.5-23 (TOURNIQUET CUFF) IMPLANT
CUFF TRNQT CYL 34X4.125X (TOURNIQUET CUFF) IMPLANT
DRAPE C-ARM 42X72 X-RAY (DRAPES) IMPLANT
DRAPE C-ARMOR (DRAPES) ×2 IMPLANT
DRAPE U-SHAPE 47X51 STRL (DRAPES) ×2 IMPLANT
DRESSING MEPILEX FLEX 4X4 (GAUZE/BANDAGES/DRESSINGS) IMPLANT
DRSG MEPILEX BORDER 4X8 (GAUZE/BANDAGES/DRESSINGS) ×1 IMPLANT
DRSG MEPILEX FLEX 4X4 (GAUZE/BANDAGES/DRESSINGS) ×4
DRSG MEPITEL 4X7.2 (GAUZE/BANDAGES/DRESSINGS) ×2 IMPLANT
ELECT REM PT RETURN 9FT ADLT (ELECTROSURGICAL) ×2
ELECTRODE REM PT RTRN 9FT ADLT (ELECTROSURGICAL) ×1 IMPLANT
GAUZE PAD ABD 8X10 STRL (GAUZE/BANDAGES/DRESSINGS) ×5 IMPLANT
GAUZE SPONGE 4X4 12PLY STRL (GAUZE/BANDAGES/DRESSINGS) ×2 IMPLANT
GLOVE BIO SURGEON STRL SZ7.5 (GLOVE) ×2 IMPLANT
GLOVE BIO SURGEON STRL SZ8 (GLOVE) ×2 IMPLANT
GLOVE BIOGEL PI IND STRL 7.5 (GLOVE) ×1 IMPLANT
GLOVE BIOGEL PI IND STRL 8 (GLOVE) ×1 IMPLANT
GLOVE BIOGEL PI INDICATOR 7.5 (GLOVE) ×1
GLOVE BIOGEL PI INDICATOR 8 (GLOVE) ×1
GLOVE SURG ORTHO LTX SZ7.5 (GLOVE) ×4 IMPLANT
GOWN STRL REUS W/ TWL LRG LVL3 (GOWN DISPOSABLE) ×2 IMPLANT
GOWN STRL REUS W/ TWL XL LVL3 (GOWN DISPOSABLE) ×1 IMPLANT
GOWN STRL REUS W/TWL LRG LVL3 (GOWN DISPOSABLE) ×4
GOWN STRL REUS W/TWL XL LVL3 (GOWN DISPOSABLE) ×2
K-WIRE 3X285 (WIRE) ×4
KIT BASIN OR (CUSTOM PROCEDURE TRAY) ×2 IMPLANT
KIT TURNOVER KIT B (KITS) ×2 IMPLANT
KWIRE 3X285 (WIRE) IMPLANT
MANIFOLD NEPTUNE II (INSTRUMENTS) ×2 IMPLANT
NEEDLE 22X1 1/2 (OR ONLY) (NEEDLE) IMPLANT
NS IRRIG 1000ML POUR BTL (IV SOLUTION) ×2 IMPLANT
PACK ORTHO EXTREMITY (CUSTOM PROCEDURE TRAY) ×2 IMPLANT
PAD ARMBOARD 7.5X6 YLW CONV (MISCELLANEOUS) ×4 IMPLANT
PAD CAST 4YDX4 CTTN HI CHSV (CAST SUPPLIES) IMPLANT
PADDING CAST COTTON 4X4 STRL (CAST SUPPLIES) ×4
PADDING CAST COTTON 6X4 STRL (CAST SUPPLIES) ×5 IMPLANT
SPONGE T-LAP 18X18 ~~LOC~~+RFID (SPONGE) ×2 IMPLANT
STAPLER VISISTAT 35W (STAPLE) IMPLANT
STOCKINETTE IMPERVIOUS LG (DRAPES) ×2 IMPLANT
SUCTION FRAZIER HANDLE 10FR (MISCELLANEOUS)
SUCTION TUBE FRAZIER 10FR DISP (MISCELLANEOUS) IMPLANT
SUT ETHILON 2 0 FS 18 (SUTURE) ×3 IMPLANT
SUT PDS AB 2-0 CT1 27 (SUTURE) IMPLANT
SUT VIC AB 0 CT1 27 (SUTURE)
SUT VIC AB 0 CT1 27XBRD ANBCTR (SUTURE) IMPLANT
SUT VIC AB 2-0 CT1 27 (SUTURE)
SUT VIC AB 2-0 CT1 TAPERPNT 27 (SUTURE) IMPLANT
SYR CONTROL 10ML LL (SYRINGE) IMPLANT
TOWEL GREEN STERILE (TOWEL DISPOSABLE) ×4 IMPLANT
TOWEL GREEN STERILE FF (TOWEL DISPOSABLE) ×4 IMPLANT
TUBE CONNECTING 12X1/4 (SUCTIONS) ×2 IMPLANT
UNDERPAD 30X36 HEAVY ABSORB (UNDERPADS AND DIAPERS) ×2 IMPLANT
YANKAUER SUCT BULB TIP NO VENT (SUCTIONS) ×2 IMPLANT

## 2021-10-22 NOTE — Anesthesia Preprocedure Evaluation (Addendum)
Anesthesia Evaluation  Patient identified by MRN, date of birth, ID band Patient awake    Reviewed: Allergy & Precautions, NPO status , Patient's Chart, lab work & pertinent test results  Airway Mallampati: I  TM Distance: >3 FB Neck ROM: Full    Dental  (+) Teeth Intact, Dental Advisory Given   Pulmonary Patient abstained from smoking., former smoker,    breath sounds clear to auscultation       Cardiovascular negative cardio ROS   Rhythm:Regular Rate:Normal     Neuro/Psych Seizures -, Well Controlled,  negative psych ROS   GI/Hepatic Neg liver ROS, GERD  ,  Endo/Other  Hypothyroidism   Renal/GU negative Renal ROS     Musculoskeletal  (+) Arthritis ,   Abdominal Normal abdominal exam  (+)   Peds  Hematology negative hematology ROS (+)   Anesthesia Other Findings   Reproductive/Obstetrics                            Anesthesia Physical Anesthesia Plan  ASA: 2  Anesthesia Plan: General   Post-op Pain Management:    Induction: Intravenous  PONV Risk Score and Plan: 3 and Ondansetron, Dexamethasone and Midazolam  Airway Management Planned: Oral ETT  Additional Equipment: None  Intra-op Plan:   Post-operative Plan: Extubation in OR  Informed Consent: I have reviewed the patients History and Physical, chart, labs and discussed the procedure including the risks, benefits and alternatives for the proposed anesthesia with the patient or authorized representative who has indicated his/her understanding and acceptance.     Dental advisory given  Plan Discussed with: CRNA  Anesthesia Plan Comments:        Anesthesia Quick Evaluation

## 2021-10-22 NOTE — Discharge Instructions (Addendum)
[facility] Discharge Instructions   General Discharge Instructions  WEIGHT BEARING STATUS: Weightbearing as tolerated bilateral lower extremities. Use walker or crutches for several days to a week as you will be sore   RANGE OF MOTION/ACTIVITY: slowly increase activity level.  Unrestricted motion of knees and ankles   Wound Care: daily wound care starting on 10/24/2021.  Do not be surprised if there is a lot of bleeding. This is normal for hardware removal.  Reinforce dressings as needed, ice and elevate extremities as well    Discharge Wound Care Instructions  Do NOT apply any ointments, solutions or lotions to pin sites or surgical wounds.  These prevent needed drainage and even though solutions like hydrogen peroxide kill bacteria, they also damage cells lining the pin sites that help fight infection.  Applying lotions or ointments can keep the wounds moist and can cause them to breakdown and open up as well. This can increase the risk for infection. When in doubt call the office.  Surgical incisions should be dressed daily.  If any drainage is noted, use one layer of adaptic or Mepitel, then gauze, Kerlix, and an ace wrap.  [email or web address] [email or web address]  [email or web address]  These dressing supplies should be available at local medical supply stores ([facility], [facility], etc). They are not usually carried at places like [facility], [facility], [facility], etc  Once the incision is completely dry and without drainage, it may be left open to air out.  Showering may begin 36-48 hours later.  Cleaning gently with soap and water.     Diet: as you were eating previously.  Can use over the counter stool softeners and bowel preparations, such as  Miralax, to help with bowel movements.  Narcotics can be constipating.  Be sure to drink plenty of fluids  PAIN MEDICATION USE AND EXPECTATIONS  You have likely been given narcotic medications to help control your pain.  After a traumatic event that results in an fracture (broken bone) with or without surgery, it is ok to use narcotic pain medications to help control one's pain.  We understand that everyone responds to pain differently and each individual patient will be evaluated on a regular basis for the continued need for narcotic medications. Ideally, narcotic medication use should last no more than 6-8 weeks (coinciding with fracture healing).   As a patient it is your responsibility as well to monitor narcotic medication use and report the amount and frequency you use these medications when you come to your office visit.   We would also advise that if you are using narcotic medications, you should take a dose prior to therapy to maximize you participation.  IF YOU ARE ON NARCOTIC MEDICATIONS IT IS NOT PERMISSIBLE TO OPERATE A MOTOR VEHICLE (MOTORCYCLE/CAR/TRUCK/MOPED) OR HEAVY MACHINERY DO NOT MIX NARCOTICS WITH OTHER CNS (CENTRAL NERVOUS SYSTEM) DEPRESSANTS SUCH AS ALCOHOL   POST-OPERATIVE OPIOID TAPER INSTRUCTIONS: It is important to wean off of your opioid medication as soon as possible. If you do not need pain medication after your surgery it is ok to stop day one. Opioids include: Codeine, Hydrocodone(Norco, Vicodin), Oxycodone(Percocet, oxycontin) and hydromorphone amongst others.  Long term and even short term use of opiods can cause: Increased pain response Dependence Constipation Depression Respiratory depression And more.  Withdrawal symptoms can include Flu like symptoms Nausea, vomiting And more Techniques to manage these symptoms Hydrate well Eat regular healthy meals Stay active Use relaxation techniques(deep breathing, meditating, yoga) Do Not substitute Alcohol  to help with  tapering If you have been on opioids for less than two weeks and do not have pain than it is ok to stop all together.  Plan to wean off of opioids This plan should start within one week post op of your fracture surgery  Maintain the same interval or time between taking each dose and first decrease the dose.  Cut the total daily intake of opioids by one tablet each day Next start to increase the time between doses. The last dose that should be eliminated is the evening dose.    STOP SMOKING OR USING NICOTINE PRODUCTS!!!!  As discussed nicotine severely impairs your body's ability to heal surgical and traumatic wounds but also impairs bone healing.  Wounds and bone heal by forming microscopic blood vessels (angiogenesis) and nicotine is a vasoconstrictor (essentially, shrinks blood vessels).  Therefore, if vasoconstriction occurs to these microscopic blood vessels they essentially disappear and are unable to deliver necessary nutrients to the healing tissue.  This is one modifiable factor that you can do to dramatically increase your chances of healing your injury.    (This means no smoking, no nicotine gum, patches, etc)  DO NOT USE NONSTEROIDAL ANTI-INFLAMMATORY DRUGS (NSAID'S)  Using products such as Advil (ibuprofen), Aleve (naproxen), Motrin (ibuprofen) for additional pain control during fracture healing can delay and/or prevent the healing response.  If you would like to take over the counter (OTC) medication, Tylenol (acetaminophen) is ok.  However, some narcotic medications that are given for pain control contain acetaminophen as well. Therefore, you should not exceed more than 4000 mg of tylenol in a day if you do not have liver disease.  Also note that there are may OTC medicines, such as cold medicines and allergy medicines that my contain tylenol as well.  If you have any questions about medications and/or interactions please ask your doctor/PA or your pharmacist.      ICE  AND ELEVATE INJURED/OPERATIVE EXTREMITY  Using ice and elevating the injured extremity above your heart can help with swelling and pain control.  Icing in a pulsatile fashion, such as 20 minutes on and 20 minutes off, can be followed.    Do not place ice directly on skin. Make sure there is a barrier between to skin and the ice pack.    Using frozen items such as frozen peas works well as the conform nicely to the are that needs to be iced.  USE AN ACE WRAP OR TED HOSE FOR SWELLING CONTROL  In addition to icing and elevation, Ace wraps or TED hose are used to help limit and resolve swelling.  It is recommended to use Ace wraps or TED hose until you are informed to stop.    When using Ace Wraps start the wrapping distally (farthest away from the body) and wrap proximally (closer to the body)   Example: If you had surgery on your leg or thing and you do not have a splint on, start the ace wrap at the toes and work your way up to the thigh        If you had surgery on your upper extremity and do not have a splint on, start the ace wrap at your fingers and work your way up to the upper arm  IF YOU ARE IN A SPLINT OR CAST DO NOT Looking[place]   If your splint gets wet for any reason please contact the office immediately. You may shower in your splint or cast as long  as you keep it dry.  This can be done by wrapping in a cast cover or garbage back (or similar)  Do Not stick any thing down your splint or cast such as pencils, money, or hangers to try and scratch yourself with.  If you feel itchy take benadryl as prescribed on the bottle for itching  IF YOU ARE IN A CAM BOOT (BLACK BOOT)  You may remove boot periodically. Perform daily dressing changes as noted below.  Wash the liner of the boot regularly and wear a sock when wearing the boot. It is recommended that you sleep in the boot until told otherwise    Call office for the following: Temperature greater than 101F Persistent nausea  and vomiting Severe uncontrolled pain Redness, tenderness, or signs of infection (pain, swelling, redness, odor or green/yellow discharge around the site) Difficulty breathing, headache or visual disturbances Hives Persistent dizziness or light-headedness Extreme fatigue Any other questions or concerns you may have after discharge  In an emergency, call 911 or go to an Emergency Department at a nearby hospital  HELPFUL INFORMATION  If you had a block, it will wear off between 8-24 hrs postop typically.  This is period when your pain may go from nearly zero to the pain you would have had postop without the block.  This is an abrupt transition but nothing dangerous is happening.  You may take an extra dose of narcotic when this happens.  You should wean off your narcotic medicines as soon as you are able.  Most patients will be off or using minimal narcotics before their first postop appointment.   We suggest you use the pain medication the first night prior to going to bed, in order to ease any pain when the anesthesia wears off. You should avoid taking pain medications on an empty stomach as it will make you nauseous.  Do not drink alcoholic beverages or take illicit drugs when taking pain medications.  In most states it is against the law to drive while you are in a splint or sling.  And certainly against the law to drive while taking narcotics.  You may return to work/school in the next couple of days when you feel up to it.   Pain medication may make you constipated.  Below are a few solutions to try in this order: Decrease the amount of pain medication if you aren't having pain. Drink lots of decaffeinated fluids. Drink prune juice and/or each dried prunes  If the first 3 don't work start with additional solutions Take Colace - an over-the-counter stool softener Take Senokot - an over-the-counter laxative Take Miralax - a stronger over-the-counter laxative     CALL THE  OFFICE WITH ANY QUESTIONS OR CONCERNS: [phone]   VISIT OUR WEBSITE FOR ADDITIONAL INFORMATION: [email or web address]

## 2021-10-22 NOTE — Anesthesia Procedure Notes (Signed)
Procedure Name: Intubation Date/Time: 10/22/2021 8:39 AM Performed by: Stanton Kidney, CRNA Pre-anesthesia Checklist: Patient identified, Patient being monitored, Timeout performed, Emergency Drugs available and Suction available Patient Re-evaluated:Patient Re-evaluated prior to induction Oxygen Delivery Method: Circle system utilized Preoxygenation: Pre-oxygenation with 100% oxygen Induction Type: IV induction Ventilation: Mask ventilation without difficulty Laryngoscope Size: 3 and Miller Grade View: Grade I Tube type: Oral Tube size: 7.0 mm Number of attempts: 1 Airway Equipment and Method: Stylet Placement Confirmation: ETT inserted through vocal cords under direct vision, positive ETCO2 and breath sounds checked- equal and bilateral Secured at: 21 cm Tube secured with: Tape Dental Injury: Teeth and Oropharynx as per pre-operative assessment

## 2021-10-22 NOTE — Transfer of Care (Signed)
Immediate Anesthesia Transfer of Care Note  Patient: Juan [name]  Procedure(s) Performed: HARDWARE REMOVAL (Bilateral)  Patient Location: PACU  Anesthesia Type:General  Level of Consciousness: awake, alert  and oriented  Airway & Oxygen Therapy: Patient Spontanous Breathing and Patient connected to face mask oxygen  Post-op Assessment: Report given to RN, Post -op Vital signs reviewed and stable, Patient moving all extremities X 4 and Patient able to stick tongue midline  Post vital signs: Reviewed  Last Vitals:  Vitals Value Taken Time  BP 122/86 10/22/21 1200  Temp 98.6   Pulse 76 10/22/21 1159  Resp 13 10/22/21 1201  SpO2 100 % 10/22/21 1159  Vitals shown include unvalidated device data.  Last Pain:  Vitals:   10/22/21 0627  TempSrc:   PainSc: 0-No pain         Complications: No notable events documented.

## 2021-10-22 NOTE — Progress Notes (Signed)
Orthopedic Tech Progress Note Patient Details:  Juan [name] 1990-12-03 [id number]  PACU RN called requesting a pair of crutches Ortho Devices Type of Ortho Device: Crutches Ortho Device/Splint Interventions: Ordered, Application, Adjustment   Post Interventions Patient Tolerated: Well, Ambulated well, Difficulty with ambulation Instructions Provided: Care of device, Poper ambulation with device  Donald Pore 10/22/2021, 12:56 PM

## 2021-10-22 NOTE — Progress Notes (Signed)
PT Cancellation Note  Patient Details Name: Skylon Means MRN: [id number] DOB: May 09, 1991   Cancelled Treatment:    Reason Eval/Treat Not Completed: PT screened, no needs identified, will sign off Per RN, no acute PT needs at this time. If needs change, please re-consult.   Lou Miner, DPT  Acute Rehabilitation Services  Office: [phone]    Rudean Hitt 10/22/2021, 12:58 PM

## 2021-10-22 NOTE — Anesthesia Postprocedure Evaluation (Signed)
Anesthesia Post Note  Patient: Juan [name]  Procedure(s) Performed: HARDWARE REMOVAL (Bilateral)     Patient location during evaluation: PACU Anesthesia Type: General Level of consciousness: awake and alert Pain management: pain level controlled Vital Signs Assessment: post-procedure vital signs reviewed and stable Respiratory status: spontaneous breathing, nonlabored ventilation, respiratory function stable and patient connected to nasal cannula oxygen Cardiovascular status: blood pressure returned to baseline and stable Postop Assessment: no apparent nausea or vomiting Anesthetic complications: no   No notable events documented.  Last Vitals:  Vitals:   10/22/21 1230 10/22/21 1245  BP: 126/78 132/87  Pulse: 75 84  Resp: 13 18  Temp: 36.5 C   SpO2: 99% 100%    Last Pain:  Vitals:   10/22/21 1200  TempSrc:   PainSc: 0-No pain                 Shelton Silvas

## 2021-10-23 ENCOUNTER — Encounter (HOSPITAL_COMMUNITY): Payer: Self-pay | Admitting: Orthopedic Surgery

## 2021-11-23 NOTE — Op Note (Signed)
10/22/2021  10:26 PM  PATIENT:  Juan [name]  31 y.o. male  PRE-OPERATIVE DIAGNOSIS:  SYMPTOMATIC HARDWARE LEFT FEMUR, LEFT TIBIA, RIGHT TIBIA  POST-OPERATIVE DIAGNOSIS:  SYMPTOMATIC HARDWARE LEFT FEMUR, LEFT TIBIA, RIGHT TIBIA  PROCEDURE:  Procedure(s): 1. DEEP IMPLANT REMOVAL LEFT FEMORAL NAIL AND SCREWS 2. DEEP IMPLANT REMOVAL LEFT TIBIAL NAIL AND SCREWS 3. DEEP IMPLANT REMOVAL RIGHT TIBIAL NAIL AND SCREWS  SURGEON:  Surgeon(s) and Role:    Myrene Galas, MD - Primary  PHYSICIAN ASSISTANT: Montez Morita, PA-C  ANESTHESIA:   general  I/O:  No intake/output data recorded.  SPECIMEN:  No Specimen  TOURNIQUET:  * Missing tourniquet times found for documented tourniquets in log: [id number] *  COMPLICATIONS: NONE  DICTATION: .Note written in EPIC  DISPOSITION: TO PACU  CONDITION: STABLE  DELAY START OF DVT PROPHYLAXIS BECAUSE OF BLEEDING RISK: NO  BRIEF SUMMARY OF INDICATION FOR PROCEDURE:  Juan [name] is a very pleasant 31 y.o. who sustained bilateral open tibia fractures and severely comminuted left femoral subtroch fracture.  The patient went on to unite and now presents for elective removal of the hardware because of continued tenderness about the implants that did not resolve with observation or conservative measures. The patient and I discussed the risks and benefits of surgery including the possibility of failure to alleviate symptoms, need for further surgery, DVT, PE, heart attack, stroke, anesthetic complications, infection, bleeding and others. The patient wished to proceed and provided consent.  BRIEF SUMMARY OF PROCEDURE:  After administration of preoperative antibiotics, the patient was taken to the operating room where general anesthesia was induced.  The left and right lower extremities were prepped and draped in usual sterile fashion.  Time-out was held.  C-arm was brought in to identify the correct starting point on the left leg.  Here, I remade the old incisions  used for screw and nail insertion at the thigh and knee. I cleared the head of each screw with a small clamp tip, then used the screwdriver to withdraw each locking bolt. At the anterior aspect of the knee, I remade the medial parapatellar retinacular incision.  A curette was initially advanced into the center of the nail and then the extraction bolt inserted, engaging it while placing a curette in the distal locking bolt site to prevent rotation. The nail was extracted but with some difficulty, being careful to avoid injury to the surrounding bone and soft tissues.   C-arm was brought in to identify the proximal locking bolts at the left knee and ankle. I cleared the head of each screw with a small clamp tip, then used the screwdriver to withdraw each locking bolt, but extraction was extremely difficult and concern about the hardware breaking off was considerable. As a result I did confer with the patient's father, suggesting that we extract the entirety of the right tibial nail also rather than just the locking bolts as we had earlier thought. I was able to engage and remove the nail but again with considerable difficulty because of bone ingrowth.    C-arm was then brought in to identify the correct starting point on the right leg.  Here, I remade the old incisions used for screw and nail insertion at the knee and ankle. I cleared the head of each screw with a small clamp tip, then used the screwdriver to withdraw each locking bolt. At the anterior aspect of the knee, I remade the medial parapatellar retinacular incision.  A curette was initially advanced into the center  of the nail and then the extraction bolt inserted, engaging it while placing a curette in the distal locking bolt site to prevent rotation. The nail was extracted with difficulty because of the bone ingrowth, but we were able to avoid injury to the surrounding bone and soft tissues.   The wounds were irrigated thoroughly, closed in standard  layered fashion with 0 Vicryl for the retinaculum, 2-0 Vicryl and 2-0 nylon for the skin.  Sterile gently compressive dressing was applied and then Ace wrap from foot to thigh.  The patient was awakened from anesthesia and transported to PACU in stable condition.  Montez Morita, PA- C, did assist me throughout with the implant removal by stabilizing the knee, preventing rotation during engagement of the extraction bolt, and wound closure.  PROGNOSIS:  Juan [name] will be weightbearing as tolerated.  Ok to shower in 2 days. Oozing from the bone is anticipated. We will plan to see back for removal of sutures in 10 days. Formal DVT prophylaxis has not been recommended.     Doralee Albino. Carola Frost, M.D.

## 2021-12-18 ENCOUNTER — Ambulatory Visit: Payer: Self-pay | Admitting: Gastroenterology
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
## 2022-02-05 ENCOUNTER — Ambulatory Visit: Payer: Self-pay | Admitting: Gastroenterology

## 2022-03-18 ENCOUNTER — Telehealth (INDEPENDENT_AMBULATORY_CARE_PROVIDER_SITE_OTHER): Payer: Self-pay | Admitting: Gastroenterology

## 2022-03-18 ENCOUNTER — Telehealth: Payer: Self-pay | Admitting: *Deleted

## 2022-03-18 ENCOUNTER — Telehealth: Payer: Self-pay

## 2022-03-18 ENCOUNTER — Encounter: Payer: Self-pay | Admitting: Gastroenterology

## 2022-03-18 VITALS — Ht 66.0 in | Wt 170.0 lb

## 2022-03-18 DIAGNOSIS — K859 Acute pancreatitis without necrosis or infection, unspecified: Secondary | ICD-10-CM

## 2022-03-18 DIAGNOSIS — R1032 Left lower quadrant pain: Secondary | ICD-10-CM

## 2022-03-18 DIAGNOSIS — K219 Gastro-esophageal reflux disease without esophagitis: Secondary | ICD-10-CM

## 2022-03-18 DIAGNOSIS — R197 Diarrhea, unspecified: Secondary | ICD-10-CM

## 2022-03-18 DIAGNOSIS — R112 Nausea with vomiting, unspecified: Secondary | ICD-10-CM

## 2022-03-18 NOTE — Telephone Encounter (Signed)
Pt informed us is scheduled for 03/24/22 at 9am, arrive at 8:45 am to check in, nothing to eat or drink after midnight.

## 2022-03-18 NOTE — Patient Instructions (Signed)
Ultrasound of your abdomen to be scheduled to look at your gallbladder as a possible source of pancreatitis.  With your next episode of vomiting, abdominal pain, please have your labs done.  Recommend you stop marijuana use as this may be contributing to your symptoms due to hyperemesis cannabinoid syndrome. I have included the following for informational purposes.   Cannabinoid Hyperemesis Syndrome Cannabinoid hyperemesis syndrome (CHS) is a condition that causes repeated nausea, vomiting, and abdominal pain after long-term use of marijuana (cannabis). People with CHS typically use marijuana 3-5 times a day for many years before they have symptoms, although it is possible to develop CHS with far less daily use. Symptoms of CHS may be mild at first but can get worse and more frequent. In some cases, CHS may cause severe daily vomiting, which can lead to weight loss and dehydration. What are the causes? The exact cause of CHS is not known. Long-term use of marijuana may overstimulate certain proteins in the brain and digestive tract that react with chemicals in marijuana (cannabinoid receptors). This overstimulation may cause CHS. What are the signs or symptoms? Symptoms of CHS are often mild during the first few episodes, but they can get worse over time. Symptoms may include: Frequent nausea, especially early in the morning. Vomiting. This can become severe. Abdominal pain. Feeling very tired (lethargic). Headaches. CHS may go away and come back many times (recur). People may not have symptoms or may otherwise be healthy in between [facility] episodes. Taking hot showers can relieve the symptoms of CHS, so feeling the need to take several hot showers throughout the day can be a sign of this condition. How is this diagnosed? CHS may be diagnosed based on: Your symptoms and medical history, including any drug use. A physical exam. You may have tests done to rule out other problems that could cause your  symptoms. These tests may include: Blood tests. Urine tests. Imaging tests, such as an X-ray or a CT scan. How is this treated? Treatment for this condition involves stopping marijuana use. Treatment may include: A drug rehab program, if you have trouble stopping marijuana use. Medicines for nausea. These may be given at the hospital through an IV inserted into one of your veins, or they may be medicines that you take by mouth (orally). Certain creams that contain a substance called capsaicin. These may improve symptoms when applied to the abdomen. Hot showers to help relieve symptoms. In severe cases, you may need treatment at a hospital. You may be given IV fluids to prevent or treat dehydration as well as medicines to treat nausea, vomiting, and pain. Follow these instructions at home: During an episode of CHS  Stay in bed and rest in a dark, quiet room. Take anti-nausea medicine as told by your health care provider. Try taking hot showers to relieve your symptoms. After an episode of CHS Drink small amounts of clear fluids. Slowly add more if you can keep the fluids down without vomiting. Once you are able to eat without vomiting, eat soft foods in small amounts every 3-4 hours. General instructions Do not use any products that contain marijuana.If you need help quitting, ask your health care provider for resources and treatment options. Drink enough fluid to keep your urine pale yellow. Avoid drinking fluids that have a lot of sugar or caffeine, such as coffee and soda. Take and apply over-the-counter and prescription medicines only as told by your health care provider. Ask your health care provider before starting any  new medicines or treatments. Keep all follow-up visits. This includes any recommended programs for substance use disorders. Contact a health care provider if: Your symptoms get worse. You cannot drink fluids without vomiting or severe pain. You have pain and trouble  swallowing after an episode. Get help right away if: You cannot stop vomiting. You have blood in your vomit or your vomit looks like coffee grounds. You have severe abdominal pain. You have stools that are bloody or black, or stools that look like tar. You have symptoms of dehydration, such as: Sunken eyes. Inability to make tears. Cracked lips or dry mouth. Decreased urine production. Weakness. Sleepiness. Dizziness, light-headedness, or fainting. These symptoms may be an emergency. Get help right away. Call 911. Do not wait to see if the symptoms will go away. Do not drive yourself to the hospital. Summary Cannabinoid hyperemesis syndrome (CHS) is a condition that causes repeated nausea, vomiting, and abdominal pain after long-term use of marijuana. Treatment for this condition involves stopping marijuana use. Hot showers and capsaicin creams may also help relieve symptoms. Your health care provider may prescribe medicines to help with nausea. Ask your health care provider before starting any medicines or other treatments. This information is not intended to replace advice given to you by your health care provider. Make sure you discuss any questions you have with your health care provider. Document Revised: 09/02/2021 Document Reviewed: 09/02/2021 Elsevier Patient Education  2023 [employer].

## 2022-03-18 NOTE — Telephone Encounter (Signed)
Juan [name], you are scheduled for a virtual visit with your provider today.  Just as we do with appointments in the office, we must obtain your consent to participate.  Your consent will be active for this visit and any virtual visit you may have with one of our providers in the next 365 days.  If you have a MyChart account, I can also send a copy of this consent to you electronically.  All virtual visits are billed to your insurance company just like a traditional visit in the office.  As this is a virtual visit, video technology does not allow for your provider to perform a traditional examination.  This may limit your provider's ability to fully assess your condition.  If your provider identifies any concerns that need to be evaluated in person or the need to arrange testing such as labs, EKG, etc, we will make arrangements to do so.  Although advances in technology are sophisticated, we cannot ensure that it will always work on either your end or our end.  If the connection with a video visit is poor, we may have to switch to a telephone visit.  With either a video or telephone visit, we are not always able to ensure that we have a secure connection.   I need to obtain your verbal consent now.   Are you willing to proceed with your visit today? Yes.
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
## 2022-03-19 ENCOUNTER — Encounter: Payer: Self-pay | Admitting: Gastroenterology

## 2022-03-24 ENCOUNTER — Ambulatory Visit (HOSPITAL_COMMUNITY): Payer: Medicaid Other | Attending: Gastroenterology

## 2022-06-17 ENCOUNTER — Emergency Department: Payer: Self-pay

## 2022-06-17 ENCOUNTER — Emergency Department (HOSPITAL_COMMUNITY)
Admission: EM | Admit: 2022-06-17 | Discharge: 2022-06-17 | Disposition: A | Discharge status: Home / Self Care | Payer: 59 | Attending: Emergency Medicine | Admitting: Emergency Medicine

## 2022-06-17 ENCOUNTER — Encounter (HOSPITAL_COMMUNITY): Payer: Self-pay | Admitting: *Deleted

## 2022-06-17 ENCOUNTER — Other Ambulatory Visit: Payer: Self-pay

## 2022-06-17 ENCOUNTER — Emergency Department (HOSPITAL_COMMUNITY): Payer: 59

## 2022-06-17 DIAGNOSIS — M25562 Pain in left knee: Secondary | ICD-10-CM | POA: Diagnosis not present

## 2022-06-17 DIAGNOSIS — Y9367 Activity, basketball: Secondary | ICD-10-CM | POA: Diagnosis not present

## 2022-06-17 DIAGNOSIS — X509XXA Other and unspecified overexertion or strenuous movements or postures, initial encounter: Secondary | ICD-10-CM | POA: Insufficient documentation

## 2022-06-17 MED ORDER — IBUPROFEN 600 MG PO TABS: 600.0000 mg | ORAL_TABLET | Freq: Four times a day (QID) | ORAL | 0 refills | Status: DC | PRN

## 2022-06-17 MED ORDER — ACETAMINOPHEN 500 MG PO TABS: 500.0000 mg | ORAL_TABLET | Freq: Four times a day (QID) | ORAL | 0 refills | Status: DC | PRN

## 2022-06-17 NOTE — ED Triage Notes (Signed)
Pt with left knee pain chronic pain, but since Saturday morning was playing basketball and has pain all over knee.  States it "locks up", tried ice and heat at home. Pt was able to ambulate to triage room

## 2022-06-17 NOTE — Discharge Instructions (Addendum)
Your x-ray looks normal today.  I have sent the extra strength Tylenol and ibuprofen to your pharmacy.  Please follow-up with one of the orthopedic doctors attached to these discharge papers.  They can better evaluate your knee and pursue further imaging if needed.  Was a pleasure to meet you and we hope you feel better.  Your work note is attached!
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
## 2022-08-21 ENCOUNTER — Other Ambulatory Visit: Payer: Self-pay

## 2022-08-21 ENCOUNTER — Encounter (HOSPITAL_COMMUNITY): Payer: Self-pay | Admitting: Orthopedic Surgery

## 2022-08-21 NOTE — Pre-Procedure Instructions (Addendum)
PCP - Dr.Nimish Nurse, mental health - denies  PPM/ICD - denies  EKG - 08/02/2012 Stress Test - denies ECHO - denies Cardiac Cath - denies  Sleep Study/CPAP - denies  Diabetic- denies  Blood Thinner Instructions:denies Aspirin Instructions:n/a  NPO after Midnight   COVID TEST- n/a   Anesthesia review: NO   Call completed with patient's wife. Patient at work and unable to answer my call.     -------------  SDW INSTRUCTIONS given to patient's wife Juan [name]:   Your procedure is scheduled on 08/22/2022.             Report to [facility] Main Entrance "A" at  0530  A.M., and check in at the Admitting office.             Call this number if you have problems the morning of surgery:             [phone]               Remember:             Do not eat or drink after midnight the night before your surgery                          Take these medicines the morning of surgery with A SIP OF WATER tylenol prn  As of today, STOP taking any Aspirin (unless otherwise instructed by your surgeon) Aleve, Naproxen, Ibuprofen, Motrin, Advil, Goody's, BC's, all herbal medications, fish oil, and all vitamins. Stop meloxicam and stop Ibuprofen starting today.                       Do not wear jewelry, make up, or nail polish            Do not wear lotions, powders, perfumes/colognes, or deodorant.            Do not shave 48 hours prior to surgery.  Men may shave face and neck.            Do not bring valuables to the hospital.            [facility] is not responsible for any belongings or valuables.   Do NOT Smoke (Tobacco/Vaping) 24 hours prior to your procedure If you use a CPAP at night, you may bring all equipment for your overnight stay.   Contacts, glasses, dentures or partials may not be worn into surgery.      For patients admitted to the hospital, discharge time will be determined by your treatment team.   Patients discharged the day of surgery will not be allowed to drive home,  and someone needs to stay with them for 24 hours.     Special instructions:   [facility]- Preparing For Surgery  Oral Hygiene is also important to reduce your risk of infection.  Remember - BRUSH YOUR TEETH THE MORNING OF SURGERY WITH YOUR REGULAR TOOTHPASTE   Before surgery, you can play an important role. Because skin is not sterile, your skin needs to be as free of germs as possible. You can reduce the number of germs on your skin by washing with Dial (or any antibacterial) Soap before surgery.    Please do not use if you have an allergy to antibacterial soaps. If your skin becomes reddened/irritated stop using the Antibacterial Soap  Do not shave (including legs and underarms) for at least 48 hours prior to surgery.  It is OK to shave your face.   Please follow these instructions carefully.              Shower the NIGHT BEFORE SURGERY and the MORNING OF SURGERY with (DIAL) Antibacterial Soap. Wash your body and hair with your normal shampoo/soap then rinse. Using a clean wash cloth wash from your neck down using the antibacterial soap. Wash thoroughly, paying special attention to the area where your surgery will be performed. Thoroughly rinse your body with warm water from the neck down.   Pat yourself dry with a CLEAN TOWEL.   Wear CLEAN PAJAMAS to bed the night before surgery.   Place CLEAN SHEETS on your bed the night of your surgery and DO NOT SLEEP WITH PETS.     Day of Surgery: Please shower morning of surgery using antibacterial soap Wear Clean/Comfortable clothing the morning of surgery Do not apply any deodorants/lotions.   Remember to brush your teeth WITH YOUR REGULAR TOOTHPASTE.   Questions were answered. Patient verbalized understanding of instructions.

## 2022-08-21 NOTE — Anesthesia Preprocedure Evaluation (Addendum)
Anesthesia Evaluation  Patient identified by MRN, date of birth, ID band Patient awake    Reviewed: Allergy & Precautions, H&P , NPO status , Patient's Chart, lab work & pertinent test results  Airway Mallampati: I  TM Distance: >3 FB Neck ROM: Full    Dental no notable dental hx. (+) Teeth Intact, Dental Advisory Given   Pulmonary neg pulmonary ROS, Patient abstained from smoking., former smoker   Pulmonary exam normal breath sounds clear to auscultation       Cardiovascular Exercise Tolerance: Good negative cardio ROS Normal cardiovascular exam Rhythm:Regular Rate:Normal     Neuro/Psych Seizures -, Well Controlled,  negative neurological ROS  negative psych ROS   GI/Hepatic negative GI ROS, Neg liver ROS,GERD  ,,  Endo/Other  negative endocrine ROSHypothyroidism    Renal/GU negative Renal ROS  negative genitourinary   Musculoskeletal negative musculoskeletal ROS (+) Arthritis ,    Abdominal   Peds negative pediatric ROS (+)  Hematology negative hematology ROS (+) Blood dyscrasia, anemia   Anesthesia Other Findings   Reproductive/Obstetrics negative OB ROS                             Anesthesia Physical Anesthesia Plan  ASA: 3  Anesthesia Plan: General   Post-op Pain Management: Tylenol PO (pre-op)*, Celebrex PO (pre-op)* and Minimal or no pain anticipated   Induction: Intravenous  PONV Risk Score and Plan: 2 and Ondansetron, Dexamethasone and Treatment may vary due to age or medical condition  Airway Management Planned: LMA  Additional Equipment: None  Intra-op Plan:   Post-operative Plan: Extubation in OR  Informed Consent: I have reviewed the patients History and Physical, chart, labs and discussed the procedure including the risks, benefits and alternatives for the proposed anesthesia with the patient or authorized representative who has indicated his/her understanding  and acceptance.       Plan Discussed with: CRNA and Anesthesiologist  Anesthesia Plan Comments: ( )        Anesthesia Quick Evaluation

## 2022-08-21 NOTE — H&P (Signed)
Orthopaedic Trauma Service (OTS) Consult   Patient ID: Juan [name] MRN: [id number] DOB/AGE: 24-Sep-1990 32 y.o.    HPI: Juan [name] is an 32 y.o. male history of polytrauma including left femur fracture and left tibia fracture which both required intramedullary nailing.  He had removal of his hardware performed June 2023.  Around December 2023 or January 2024 he started to have issues with his left knee giving out on him randomly.  Mostly when he was just walking his leg would give out.  This has caused him to fall.  He states that this occurs on a weekly basis.  He recently started working at a [employer] which is quite a labor-intensive job.  He has gotten to the point where he is frustrated and wanted further explanation as to what was going on.  Patient was seen and evaluated in the office on 08/20/2022.  Exam was performed along with imaging of his left knee.  Imaging of his left knee including AP lateral sunrise and notch views of his left knee were concerning for the possibility of loose body.  We did discuss the possibility of obtaining an MRI beforehand however patient is concerned with the cost as he does not have insurance at this current time.  As such based off his clinical exam and x-ray findings we felt that proceeding to the OR for diagnostic arthroscopy was justified.  Patient was in complete agreement with the plan and presents today for left knee arthroscopy.  Past Medical History:  Diagnosis Date   Closed left subtrochanteric femur fracture ([place]) 01/06/2015   History of blood transfusion    Hypothyroidism    Open fracture of right tibia 01/06/2015   Open fracture of shaft of left tibia, type III 01/06/2015   Seizures ([place])    x 1 at age 32   TBI (traumatic brain injury) ([place]) 2016    Past Surgical History:  Procedure Laterality Date   BIOPSY  08/02/2020   Procedure: BIOPSY;  Surgeon: Daneil Dolin, MD;  [facility];  Service: Endoscopy;;    COLONOSCOPY WITH PROPOFOL N/A 08/02/2020   Procedure: COLONOSCOPY WITH PROPOFOL;  Surgeon: Daneil Dolin, MD;  [facility];  Service: Endoscopy;  Laterality: N/A;  am - Positive home covid test 2/9 but also went to [facility] for test. His wife is aware office needs copy of positive result   ESOPHAGOGASTRODUODENOSCOPY (EGD) WITH PROPOFOL N/A 01/18/2015   Procedure: ESOPHAGOGASTRODUODENOSCOPY (EGD) WITH PROPOFOL;  Surgeon: Georganna Skeans, MD;  Location: [facility];  Service: General;  Laterality: N/A;   FEMUR IM NAIL Bilateral 01/06/2015   Procedure: IRRIGATION AND DEBRIDEMENT BILATERAL LEGS WITH APPLICATION EXTERNAL FIXATOR RIGHT  TIBIA AND APPLICATION EXTERNAL FIXATORS TO LEFT FEMUR  AND LEFT TIBIA ;  Surgeon: Marchia Bond, MD;  Location: [facility];  Service: Orthopedics;  Laterality: Bilateral;   FEMUR IM NAIL Left 01/15/2015   Procedure: INTRAMEDULLARY (IM) RETROGRADE FEMORAL NAILING;  Surgeon: Altamese Darwin, MD;  Location: [facility];  Service: Orthopedics;  Laterality: Left;   HARDWARE REMOVAL Bilateral 10/22/2021   Procedure: HARDWARE REMOVAL;  Surgeon: Altamese [place]acks[place], MD;  Location: [facility];  Service: Orthopedics;  Laterality: Bilateral;   I & D EXTREMITY Bilateral 01/15/2015   Procedure: IRRIGATION AND DEBRIDEMENT BILATERAL EXTREMITY;  Surgeon: Altamese , MD;  Location: [place];  Service: Orthopedics;  Laterality: Bilateral;   ORIF PELVIC FRACTURE Bilateral 01/15/2015   Procedure: orif pelvis bilateral iliac screws percantaneous fixation left  tavern, im nail bilateral tibia, retrograde im nail left femur, removal of external fixation ;  Surgeon: Altamese [place]mount, MD;  Location: [facility];  Service: Orthopedics;  Laterality: Bilateral;   PEG PLACEMENT N/A 01/18/2015   Procedure: PERCUTANEOUS ENDOSCOPIC GASTROSTOMY (PEG) PLACEMENT;  Surgeon: Georganna Skeans, MD;  Location: [facility];  Service: General;  Laterality: N/A;  bedside   PERCUTANEOUS TRACHEOSTOMY N/A 01/18/2015   Procedure: PERCUTANEOUS  TRACHEOSTOMY (BEDSIDE);  Surgeon: Georganna Skeans, MD;  Location: [facility];  Service: General;  Laterality: N/A;   TIBIA IM NAIL INSERTION Bilateral 01/15/2015   Procedure: INTRAMEDULLARY (IM) NAIL TIBIAL;  Surgeon: Altamese [place], MD;  Location: [facility];  Service: Orthopedics;  Laterality: Bilateral;    Family History  Problem Relation Age of Onset   Hypertension Mother    Diabetes Father    Cancer Maternal Grandfather    Cirrhosis Paternal Grandfather        no etoh   Colon cancer Neg Hx    Gastric cancer Neg Hx    Esophageal cancer Neg Hx    Inflammatory bowel disease Neg Hx     Social History:  reports that he has quit smoking. His smoking use included cigarettes. He has quit using smokeless tobacco. He reports current alcohol use of about 3.0 standard drinks of alcohol per week. He reports current drug use. Frequency: 7.00 times per week. Drug: Marijuana.  Allergies: No Known Allergies  Medications: I have reviewed the patient's current medications. Current Outpatient Medications  Medication Instructions   acetaminophen (TYLENOL) 1,000 mg, Oral, Every 8 hours PRN, Do not resume until you stop taking the percocet (oxycodone/acetaminophen) as this contains tylenol as well   acetaminophen (TYLENOL) 500 mg, Oral, Every 6 hours PRN   ibuprofen (ADVIL) 600 mg, Oral, Every 6 hours PRN     No results found for this or any previous visit (from the past 48 hour(s)).  No results found.  Intake/Output    None      Review of Systems  Constitutional:  Negative for chills and fever.  Respiratory:  Negative for shortness of breath.   Cardiovascular:  Negative for chest pain and palpitations.  Gastrointestinal:  Negative for abdominal pain, nausea and vomiting.  Musculoskeletal:        Left knee pain which is intermittent in nature  Neurological:  Negative for tingling and sensory change.   There were no vitals taken for this visit. Physical Exam Vitals reviewed.  Constitutional:       General: He is not in acute distress.    Appearance: Normal appearance. He is normal weight.  HENT:     Head: Normocephalic and atraumatic.     Mouth/Throat:     Mouth: Mucous membranes are moist.  Eyes:     Extraocular Movements: Extraocular movements intact.  Cardiovascular:     Rate and Rhythm: Normal rate and regular rhythm.  Pulmonary:     Effort: Pulmonary effort is normal.  Musculoskeletal:     Comments: Left lower extremity Left knee with mild knee effusion Diffuse tenderness left knee is noted Some mild medial joint line pain with McMurray's test but no palpable crepitus Pain with patellar compression but not too severe He does have some mild restriction in knee range of motion compared to contralateral side mostly in terminal flexion No instability with varus valgus stressing No instability with cruciate evaluation + DP pulse Extremity is warm No significant swelling distally + Quad set and patient can perform straight leg raise without difficulty Distal  motor and sensory functions otherwise intact   Skin:    Capillary Refill: Capillary refill takes less than 2 seconds.  Neurological:     General: No focal deficit present.     Mental Status: He is alert and oriented to person, place, and time.  Psychiatric:        Mood and Affect: Mood normal.        Behavior: Behavior normal.        Thought Content: Thought content normal.    Assessment/Plan:  32 year old male history of remote polytrauma including left femur fracture, left tibia fracture s/p removal of hardware with new onset left knee pain and instability with exam and x-rays concerning for loose body  -Suspected loose body left knee  Imaging and clinical exam favor loose body over patellofemoral arthritis as the primary culprit  We did discuss the possibility of obtaining an MRI prior to arthroscopy however patient is concerned about cost as he is uninsured and has elected to proceed directly with knee  arthroscopy.  We feel that this is completely reasonable   Plan for outpatient surgery   Weight-bear as tolerated postoperatively may need to use crutches for a few days   Risks and benefits reviewed with the patient and he wishes to proceed  - Pain management:  Multimodal - - DVT/PE prophylaxis:  Mobilize postoperatively  Do not anticipate need for pharmacologic prophylaxis - ID:   Perioperative antibiotics  - Dispo:  OR for left knee arthroscopy    Jari Pigg, PA-C [phone] (C) 08/21/2022, 12:06 PM  [facility] [place] [address] [phone] Jenetta [phone] (F)    After 5pm and on the weekends please log on to Amion, go to orthopaedics and the look under the [facility] Call for the provider(s) on call. You can also call our office at [phone] and then follow the prompts to be connected to the call team.

## 2022-08-22 ENCOUNTER — Ambulatory Visit (HOSPITAL_COMMUNITY)
Admission: RE | Admit: 2022-08-22 | Discharge: 2022-08-22 | Disposition: A | Discharge status: Home / Self Care | Payer: Self-pay | Attending: Orthopedic Surgery | Admitting: Orthopedic Surgery

## 2022-08-22 ENCOUNTER — Encounter (HOSPITAL_COMMUNITY): Payer: Self-pay | Admitting: Orthopedic Surgery

## 2022-08-22 ENCOUNTER — Other Ambulatory Visit (HOSPITAL_COMMUNITY): Payer: Self-pay

## 2022-08-22 ENCOUNTER — Ambulatory Visit (HOSPITAL_BASED_OUTPATIENT_CLINIC_OR_DEPARTMENT_OTHER): Payer: 59 | Admitting: Anesthesiology

## 2022-08-22 ENCOUNTER — Encounter (HOSPITAL_COMMUNITY)
Admission: RE | Disposition: A | Discharge status: Home / Self Care | Payer: Self-pay | Source: Home / Self Care | Attending: Orthopedic Surgery

## 2022-08-22 ENCOUNTER — Ambulatory Visit (HOSPITAL_COMMUNITY): Payer: 59 | Admitting: Anesthesiology

## 2022-08-22 ENCOUNTER — Other Ambulatory Visit: Payer: Self-pay

## 2022-08-22 DIAGNOSIS — M1732 Unilateral post-traumatic osteoarthritis, left knee: Secondary | ICD-10-CM

## 2022-08-22 DIAGNOSIS — M2342 Loose body in knee, left knee: Secondary | ICD-10-CM

## 2022-08-22 DIAGNOSIS — Z87891 Personal history of nicotine dependence: Secondary | ICD-10-CM | POA: Insufficient documentation

## 2022-08-22 DIAGNOSIS — M6752 Plica syndrome, left knee: Secondary | ICD-10-CM | POA: Insufficient documentation

## 2022-08-22 DIAGNOSIS — D638 Anemia in other chronic diseases classified elsewhere: Secondary | ICD-10-CM

## 2022-08-22 DIAGNOSIS — E039 Hypothyroidism, unspecified: Secondary | ICD-10-CM

## 2022-08-22 DIAGNOSIS — D759 Disease of blood and blood-forming organs, unspecified: Secondary | ICD-10-CM | POA: Insufficient documentation

## 2022-08-22 DIAGNOSIS — Z8781 Personal history of (healed) traumatic fracture: Secondary | ICD-10-CM | POA: Insufficient documentation

## 2022-08-22 DIAGNOSIS — D649 Anemia, unspecified: Secondary | ICD-10-CM | POA: Insufficient documentation

## 2022-08-22 DIAGNOSIS — M2242 Chondromalacia patellae, left knee: Secondary | ICD-10-CM | POA: Insufficient documentation

## 2022-08-22 HISTORY — PX: KNEE ARTHROSCOPY: SHX127

## 2022-08-22 LAB — BASIC METABOLIC PANEL
Anion gap: 7 (ref 5–15)
BUN: 14 mg/dL (ref 6–20)
CO2: 26 mmol/L (ref 22–32)
Calcium: 9.2 mg/dL (ref 8.9–10.3)
Chloride: 107 mmol/L (ref 98–111)
Creatinine, Ser: 0.84 mg/dL (ref 0.61–1.24)
GFR, Estimated: >60 mL/min (ref >60)
Glucose, Bld: 101 mg/dL — ABNORMAL HIGH (ref 70–99)
Potassium: 4.1 mmol/L (ref 3.5–5.1)
Sodium: 140 mmol/L (ref 135–145)

## 2022-08-22 LAB — CBC
HCT: 40.4 % (ref 39.0–52.0)
Hemoglobin: 13.3 g/dL (ref 13.0–17.0)
MCH: 29.4 pg (ref 26.0–34.0)
MCHC: 32.9 g/dL (ref 30.0–36.0)
MCV: 89.2 fL (ref 80.0–100.0)
Platelets: 217 10*3/uL (ref 150–400)
RBC: 4.53 MIL/uL (ref 4.22–5.81)
RDW: 13.2 % (ref 11.5–15.5)
WBC: 4.9 10*3/uL (ref 4.0–10.5)
nRBC: 0 % (ref 0.0–0.2)

## 2022-08-22 SURGERY — ARTHROSCOPY, KNEE
Anesthesia: General | Site: Knee | Laterality: Left

## 2022-08-22 MED ORDER — MIDAZOLAM HCL 2 MG/2ML IJ SOLN
INTRAMUSCULAR | Status: AC
  Filled 2022-08-22: qty 2

## 2022-08-22 MED ORDER — FENTANYL CITRATE (PF) 250 MCG/5ML IJ SOLN
INTRAMUSCULAR | Status: AC
  Filled 2022-08-22: qty 5

## 2022-08-22 MED ORDER — HYDROCODONE-ACETAMINOPHEN 5-325 MG PO TABS
1.0000 | ORAL_TABLET | Freq: Three times a day (TID) | ORAL | 0 refills | Status: DC | PRN
  Filled 2022-08-22: qty 40, 7d supply, fill #0

## 2022-08-22 MED ORDER — ORAL CARE MOUTH RINSE: 15.0000 mL | Freq: Once | OROMUCOSAL | Status: AC

## 2022-08-22 MED ORDER — MIDAZOLAM HCL 2 MG/2ML IJ SOLN
INTRAMUSCULAR | Status: DC | PRN
  Administered 2022-08-22: 2 mg via INTRAVENOUS

## 2022-08-22 MED ORDER — OXYCODONE HCL 5 MG PO TABS
5.0000 mg | ORAL_TABLET | Freq: Once | ORAL | Status: AC | PRN
  Administered 2022-08-22: 5 mg via ORAL

## 2022-08-22 MED ORDER — KETOROLAC TROMETHAMINE 10 MG PO TABS
10.0000 mg | ORAL_TABLET | Freq: Four times a day (QID) | ORAL | 0 refills | Status: DC | PRN
  Filled 2022-08-22: qty 20, 5d supply, fill #0

## 2022-08-22 MED ORDER — ACETAMINOPHEN 325 MG PO TABS: 325.0000 mg | ORAL_TABLET | ORAL | Status: DC | PRN

## 2022-08-22 MED ORDER — EPINEPHRINE PF 1 MG/ML IJ SOLN
INTRAMUSCULAR | Status: DC | PRN
  Administered 2022-08-22: .15 mL

## 2022-08-22 MED ORDER — FENTANYL CITRATE (PF) 100 MCG/2ML IJ SOLN
25.0000 ug | INTRAMUSCULAR | Status: DC | PRN
  Administered 2022-08-22: 50 ug via INTRAVENOUS

## 2022-08-22 MED ORDER — BUPIVACAINE HCL (PF) 0.25 % IJ SOLN
INTRAMUSCULAR | Status: AC
  Filled 2022-08-22: qty 30

## 2022-08-22 MED ORDER — OXYCODONE HCL 5 MG/5ML PO SOLN: 5.0000 mg | Freq: Once | ORAL | Status: AC | PRN

## 2022-08-22 MED ORDER — CHLORHEXIDINE GLUCONATE 0.12 % MT SOLN
15.0000 mL | Freq: Once | OROMUCOSAL | Status: AC
  Administered 2022-08-22: 15 mL via OROMUCOSAL
  Filled 2022-08-22: qty 15

## 2022-08-22 MED ORDER — LIDOCAINE 2% (20 MG/ML) 5 ML SYRINGE
INTRAMUSCULAR | Status: DC | PRN
  Administered 2022-08-22: 60 mg via INTRAVENOUS

## 2022-08-22 MED ORDER — ACETAMINOPHEN 160 MG/5ML PO SOLN: 325.0000 mg | ORAL | Status: DC | PRN

## 2022-08-22 MED ORDER — CEFAZOLIN SODIUM-DEXTROSE 2-4 GM/100ML-% IV SOLN
2.0000 g | INTRAVENOUS | Status: AC
  Administered 2022-08-22: 2 g via INTRAVENOUS
  Filled 2022-08-22: qty 100

## 2022-08-22 MED ORDER — ONDANSETRON HCL 4 MG/2ML IJ SOLN: 4.0000 mg | Freq: Once | INTRAMUSCULAR | Status: DC | PRN

## 2022-08-22 MED ORDER — ONDANSETRON HCL 4 MG/2ML IJ SOLN
INTRAMUSCULAR | Status: AC
  Filled 2022-08-22: qty 2

## 2022-08-22 MED ORDER — ASPIRIN 325 MG PO TBEC
325.0000 mg | DELAYED_RELEASE_TABLET | Freq: Every day | ORAL | 0 refills | Status: AC
  Filled 2022-08-22: qty 21, 21d supply, fill #0

## 2022-08-22 MED ORDER — OXYCODONE HCL 5 MG PO TABS
ORAL_TABLET | ORAL | Status: AC
  Filled 2022-08-22: qty 1

## 2022-08-22 MED ORDER — EPINEPHRINE PF 1 MG/ML IJ SOLN
INTRAMUSCULAR | Status: AC
  Filled 2022-08-22: qty 1

## 2022-08-22 MED ORDER — MEPERIDINE HCL 25 MG/ML IJ SOLN: 6.2500 mg | INTRAMUSCULAR | Status: DC | PRN

## 2022-08-22 MED ORDER — LIDOCAINE 2% (20 MG/ML) 5 ML SYRINGE
INTRAMUSCULAR | Status: AC
  Filled 2022-08-22: qty 5

## 2022-08-22 MED ORDER — DEXAMETHASONE SODIUM PHOSPHATE 10 MG/ML IJ SOLN
INTRAMUSCULAR | Status: AC
  Filled 2022-08-22: qty 1

## 2022-08-22 MED ORDER — LACTATED RINGERS IV SOLN: INTRAVENOUS | Status: DC

## 2022-08-22 MED ORDER — PROPOFOL 10 MG/ML IV BOLUS
INTRAVENOUS | Status: DC | PRN
  Administered 2022-08-22: 50 mg via INTRAVENOUS
  Administered 2022-08-22: 200 mg via INTRAVENOUS

## 2022-08-22 MED ORDER — BUPIVACAINE HCL 0.25 % IJ SOLN
INTRAMUSCULAR | Status: DC | PRN
  Administered 2022-08-22: 20 mL
  Administered 2022-08-22: 10 mL

## 2022-08-22 MED ORDER — ACETAMINOPHEN 500 MG PO TABS
1000.0000 mg | ORAL_TABLET | Freq: Once | ORAL | Status: AC
  Administered 2022-08-22: 1000 mg via ORAL
  Filled 2022-08-22: qty 2

## 2022-08-22 MED ORDER — FENTANYL CITRATE (PF) 250 MCG/5ML IJ SOLN
INTRAMUSCULAR | Status: DC | PRN
  Administered 2022-08-22: 50 ug via INTRAVENOUS

## 2022-08-22 MED ORDER — 0.9 % SODIUM CHLORIDE (POUR BTL) OPTIME
TOPICAL | Status: DC | PRN
  Administered 2022-08-22: 1000 mL

## 2022-08-22 MED ORDER — FENTANYL CITRATE (PF) 100 MCG/2ML IJ SOLN
INTRAMUSCULAR | Status: AC
  Filled 2022-08-22: qty 2

## 2022-08-22 MED ORDER — SODIUM CHLORIDE 0.9 % IR SOLN
Status: DC | PRN
  Administered 2022-08-22 (×4): 3000 mL

## 2022-08-22 MED ORDER — DEXAMETHASONE SODIUM PHOSPHATE 10 MG/ML IJ SOLN
INTRAMUSCULAR | Status: DC | PRN
  Administered 2022-08-22: 5 mg via INTRAVENOUS

## 2022-08-22 MED ORDER — CELECOXIB 200 MG PO CAPS
200.0000 mg | ORAL_CAPSULE | Freq: Once | ORAL | Status: AC
  Administered 2022-08-22: 200 mg via ORAL
  Filled 2022-08-22: qty 1

## 2022-08-22 MED ORDER — PROPOFOL 10 MG/ML IV BOLUS
INTRAVENOUS | Status: AC
  Filled 2022-08-22: qty 20

## 2022-08-22 MED ORDER — ONDANSETRON HCL 4 MG/2ML IJ SOLN
INTRAMUSCULAR | Status: DC | PRN
  Administered 2022-08-22: 4 mg via INTRAVENOUS

## 2022-08-22 MED ORDER — ONDANSETRON 4 MG PO TBDP
4.0000 mg | ORAL_TABLET | Freq: Three times a day (TID) | ORAL | 0 refills | Status: DC | PRN
  Filled 2022-08-22: qty 20, 7d supply, fill #0

## 2022-08-22 SURGICAL SUPPLY — 50 items
BAG COUNTER SPONGE SURGICOUNT (BAG) ×1 IMPLANT
BAG SPNG CNTER NS LX DISP (BAG) ×1
BANDAGE ESMARK 6X9 LF (GAUZE/BANDAGES/DRESSINGS) IMPLANT
BLADE CUDA 5.5 (BLADE) IMPLANT
BLADE EXCALIBUR 4.0X13 (MISCELLANEOUS) ×1 IMPLANT
BLADE SURG 11 STRL SS (BLADE) ×1 IMPLANT
BNDG CMPR 5X62 HK CLSR LF (GAUZE/BANDAGES/DRESSINGS) ×1
BNDG CMPR 9X6 STRL LF SNTH (GAUZE/BANDAGES/DRESSINGS)
BNDG ELASTIC 4X5.8 VLCR STR LF (GAUZE/BANDAGES/DRESSINGS) IMPLANT
BNDG ELASTIC 6INX 5YD STR LF (GAUZE/BANDAGES/DRESSINGS) IMPLANT
BNDG ELASTIC 6X5.8 VLCR STR LF (GAUZE/BANDAGES/DRESSINGS) ×2 IMPLANT
BNDG ESMARK 6X9 LF (GAUZE/BANDAGES/DRESSINGS)
BRUSH SCRUB EZ PLAIN DRY (MISCELLANEOUS) ×2 IMPLANT
COVER SURGICAL LIGHT HANDLE (MISCELLANEOUS) ×2 IMPLANT
CUFF TOURN SGL QUICK 34 (TOURNIQUET CUFF) ×1
CUFF TOURN SGL QUICK 42 (TOURNIQUET CUFF) IMPLANT
CUFF TRNQT CYL 34X4.125X (TOURNIQUET CUFF) IMPLANT
DRAPE ARTHROSCOPY W/POUCH 114 (DRAPES) ×1 IMPLANT
DRAPE U-SHAPE 47X51 STRL (DRAPES) ×1 IMPLANT
DRSG EMULSION OIL 3X3 NADH (GAUZE/BANDAGES/DRESSINGS) ×1 IMPLANT
GAUZE 4X4 16PLY ~~LOC~~+RFID DBL (SPONGE) ×1 IMPLANT
GAUZE PAD ABD 8X10 STRL (GAUZE/BANDAGES/DRESSINGS) ×2 IMPLANT
GAUZE SPONGE 4X4 12PLY STRL (GAUZE/BANDAGES/DRESSINGS) ×1 IMPLANT
GLOVE BIO SURGEON STRL SZ7.5 (GLOVE) ×1 IMPLANT
GLOVE BIO SURGEON STRL SZ8 (GLOVE) ×1 IMPLANT
GLOVE BIOGEL PI IND STRL 7.5 (GLOVE) ×1 IMPLANT
GLOVE BIOGEL PI IND STRL 8 (GLOVE) ×1 IMPLANT
GLOVE SURG ORTHO LTX SZ7.5 (GLOVE) ×2 IMPLANT
GOWN STRL REUS W/ TWL LRG LVL3 (GOWN DISPOSABLE) ×2 IMPLANT
GOWN STRL REUS W/ TWL XL LVL3 (GOWN DISPOSABLE) ×1 IMPLANT
GOWN STRL REUS W/TWL LRG LVL3 (GOWN DISPOSABLE) ×2
GOWN STRL REUS W/TWL XL LVL3 (GOWN DISPOSABLE) ×1
KIT BASIN OR (CUSTOM PROCEDURE TRAY) ×1 IMPLANT
KIT TURNOVER KIT B (KITS) ×1 IMPLANT
MANIFOLD NEPTUNE II (INSTRUMENTS) ×1 IMPLANT
PACK ARTHROSCOPY DSU (CUSTOM PROCEDURE TRAY) ×1 IMPLANT
PAD ARMBOARD 7.5X6 YLW CONV (MISCELLANEOUS) ×2 IMPLANT
PAD CAST 4YDX4 CTTN HI CHSV (CAST SUPPLIES) IMPLANT
PADDING CAST COTTON 4X4 STRL (CAST SUPPLIES) ×1
PADDING CAST COTTON 6X4 STRL (CAST SUPPLIES) ×2 IMPLANT
PORT APPOLLO RF 90DEGREE MULTI (SURGICAL WAND) IMPLANT
SPONGE T-LAP 18X18 ~~LOC~~+RFID (SPONGE) IMPLANT
SUT ETHILON 2 0 FS 18 (SUTURE) IMPLANT
SUT ETHILON 4 0 PS 2 18 (SUTURE) ×1 IMPLANT
SUT VIC AB 2-0 FS1 27 (SUTURE) IMPLANT
SYR TB 1ML LUER SLIP (SYRINGE) IMPLANT
TOWEL GREEN STERILE FF (TOWEL DISPOSABLE) ×2 IMPLANT
TUBE CONNECTING 12X1/4 (SUCTIONS) ×1 IMPLANT
TUBING ARTHROSCOPY IRRIG 16FT (MISCELLANEOUS) ×1 IMPLANT
WATER STERILE IRR 1000ML POUR (IV SOLUTION) ×1 IMPLANT

## 2022-08-22 NOTE — Discharge Instructions (Addendum)
Orthopaedic Trauma Service Discharge Instructions   General Discharge Instructions   WEIGHT BEARING STATUS: Weightbearing as tolerated Left leg. Will need to use crutches for a few days  RANGE OF MOTION/ACTIVITY:unrestricted motion of Left knee. Slowly increase activity level   Wound Care: daily wound care starting on 08/24/2022.  See below  Discharge Wound Care Instructions  Do NOT apply any ointments, solutions or lotions to pin sites or surgical wounds.  These prevent needed drainage and even though solutions like hydrogen peroxide kill bacteria, they also damage cells lining the pin sites that help fight infection.  Applying lotions or ointments can keep the wounds moist and can cause them to breakdown and open up as well. This can increase the risk for infection. When in doubt call the office.  Surgical incisions should be dressed daily.  If any drainage is noted, use one layer of adaptic or Mepitel, then gauze, Kerlix, and an ace wrap.  [email or web address] [email or web address]_[email or web address]&[email or web address]  [email or web address]  These dressing supplies should be available at local medical supply stores ([facility], [facility], etc). They are not usually carried at places like [facility], [facility], [facility], etc  Once the incision is completely dry and without drainage, it may be left open to air out.  Showering may begin 36-48 hours later.  Cleaning gently with soap and water.  Traumatic wounds should be dressed daily as well.    One layer of adaptic, gauze, Kerlix, then ace wrap.  The adaptic can be discontinued once the draining has ceased    If you have a wet to dry dressing: wet the gauze with saline the squeeze as much saline out so the gauze is moist (not soaking wet), place moistened  gauze over wound, then place a dry gauze over the moist one, followed by Kerlix wrap, then ace wrap.  DVT/PE prophylaxis: aspirin 325 mg daily x 21 days   Diet: as you were eating previously.  Can use over the counter stool softeners and bowel preparations, such as Miralax, to help with bowel movements.  Narcotics can be constipating.  Be sure to drink plenty of fluids  PAIN MEDICATION USE AND EXPECTATIONS  You have likely been given narcotic medications to help control your pain.  After a traumatic event that results in an fracture (broken bone) with or without surgery, it is ok to use narcotic pain medications to help control one's pain.  We understand that everyone responds to pain differently and each individual patient will be evaluated on a regular basis for the continued need for narcotic medications. Ideally, narcotic medication use should last no more than 6-8 weeks (coinciding with fracture healing).   As a patient it is your responsibility as well to monitor narcotic medication use and report the amount and frequency you use these medications when you come to your office visit.   We would also advise that if you are using narcotic medications, you should take a dose prior to therapy to maximize you participation.  IF YOU ARE ON NARCOTIC MEDICATIONS IT IS NOT PERMISSIBLE TO OPERATE A MOTOR VEHICLE (MOTORCYCLE/CAR/TRUCK/MOPED) OR HEAVY MACHINERY DO NOT MIX NARCOTICS WITH OTHER CNS (CENTRAL NERVOUS SYSTEM) DEPRESSANTS SUCH AS ALCOHOL   POST-OPERATIVE OPIOID TAPER INSTRUCTIONS: It is important to wean off of your opioid medication as soon as possible. If you do not need pain medication after your surgery it is ok to stop day one. Opioids include: Codeine, Hydrocodone(Norco, Vicodin), Oxycodone(Percocet, oxycontin) and hydromorphone amongst others.  Long term and even short term use  of opiods can cause: Increased pain response Dependence Constipation Depression Respiratory  depression And more.  Withdrawal symptoms can include Flu like symptoms Nausea, vomiting And more Techniques to manage these symptoms Hydrate well Eat regular healthy meals Stay active Use relaxation techniques(deep breathing, meditating, yoga) Do Not substitute Alcohol to help with tapering If you have been on opioids for less than two weeks and do not have pain than it is ok to stop all together.  Plan to wean off of opioids This plan should start within one week post op of your fracture surgery  Maintain the same interval or time between taking each dose and first decrease the dose.  Cut the total daily intake of opioids by one tablet each day Next start to increase the time between doses. The last dose that should be eliminated is the evening dose.    STOP SMOKING OR USING NICOTINE PRODUCTS!!!!  As discussed nicotine severely impairs your body's ability to heal surgical and traumatic wounds but also impairs bone healing.  Wounds and bone heal by forming microscopic blood vessels (angiogenesis) and nicotine is a vasoconstrictor (essentially, shrinks blood vessels).  Therefore, if vasoconstriction occurs to these microscopic blood vessels they essentially disappear and are unable to deliver necessary nutrients to the healing tissue.  This is one modifiable factor that you can do to dramatically increase your chances of healing your injury.    (This means no smoking, no nicotine gum, patches, etc)  DO NOT USE NONSTEROIDAL ANTI-INFLAMMATORY DRUGS (NSAID'S)  Using products such as Advil (ibuprofen), Aleve (naproxen), Motrin (ibuprofen) for additional pain control during fracture healing can delay and/or prevent the healing response.  If you would like to take over the counter (OTC) medication, Tylenol (acetaminophen) is ok.  However, some narcotic medications that are given for pain control contain acetaminophen as well. Therefore, you should not exceed more than 4000 mg of tylenol in a day  if you do not have liver disease.  Also note that there are may OTC medicines, such as cold medicines and allergy medicines that my contain tylenol as well.  If you have any questions about medications and/or interactions please ask your doctor/PA or your pharmacist.      ICE AND ELEVATE INJURED/OPERATIVE EXTREMITY  Using ice and elevating the injured extremity above your heart can help with swelling and pain control.  Icing in a pulsatile fashion, such as 20 minutes on and 20 minutes off, can be followed.    Do not place ice directly on skin. Make sure there is a barrier between to skin and the ice pack.    Using frozen items such as frozen peas works well as the conform nicely to the are that needs to be iced.  USE AN ACE WRAP OR TED HOSE FOR SWELLING CONTROL  In addition to icing and elevation, Ace wraps or TED hose are used to help limit and resolve swelling.  It is recommended to use Ace wraps or TED hose until you are informed to stop.    When using Ace Wraps start the wrapping distally (farthest away from the body) and wrap proximally (closer to the body)   Example: If you had surgery on your leg or thing and you do not have a splint on, start the ace wrap at the toes and work your way up to the thigh        If you had surgery on your upper extremity and do not have a splint on, start the ace wrap  at your fingers and work your way up to the upper arm  IF YOU ARE IN A SPLINT OR CAST DO NOT REMOVE IT FOR ANY REASON   If your splint gets wet for any reason please contact the office immediately. You may shower in your splint or cast as long as you keep it dry.  This can be done by wrapping in a cast cover or garbage back (or similar)  Do Not stick any thing down your splint or cast such as pencils, money, or hangers to try and scratch yourself with.  If you feel itchy take benadryl as prescribed on the bottle for itching  IF YOU ARE IN A CAM BOOT (BLACK BOOT)  You may remove boot periodically.  Perform daily dressing changes as noted below.  Wash the liner of the boot regularly and wear a sock when wearing the boot. It is recommended that you sleep in the boot until told otherwise    Call office for the following: Temperature greater than 101F Persistent nausea and vomiting Severe uncontrolled pain Redness, tenderness, or signs of infection (pain, swelling, redness, odor or green/yellow discharge around the site) Difficulty breathing, headache or visual disturbances Hives Persistent dizziness or light-headedness Extreme fatigue Any other questions or concerns you may have after discharge  In an emergency, call 911 or go to an Emergency Department at a nearby hospital  HELPFUL INFORMATION  If you had a block, it will wear off between 8-24 hrs postop typically.  This is period when your pain may go from nearly zero to the pain you would have had postop without the block.  This is an abrupt transition but nothing dangerous is happening.  You may take an extra dose of narcotic when this happens.  You should wean off your narcotic medicines as soon as you are able.  Most patients will be off or using minimal narcotics before their first postop appointment.   We suggest you use the pain medication the first night prior to going to bed, in order to ease any pain when the anesthesia wears off. You should avoid taking pain medications on an empty stomach as it will make you nauseous.  Do not drink alcoholic beverages or take illicit drugs when taking pain medications.  In most states it is against the law to drive while you are in a splint or sling.  And certainly against the law to drive while taking narcotics.  You may return to work/school in the next couple of days when you feel up to it.   Pain medication may make you constipated.  Below are a few solutions to try in this order: Decrease the amount of pain medication if you aren't having pain. Drink lots of decaffeinated  fluids. Drink prune juice and/or each dried prunes  If the first 3 don't work start with additional solutions Take Colace - an over-the-counter stool softener Take Senokot - an over-the-counter laxative Take Miralax - a stronger over-the-counter laxative     CALL THE OFFICE WITH ANY QUESTIONS OR CONCERNS: [phone]   VISIT OUR WEBSITE FOR ADDITIONAL INFORMATION: [email or web address]

## 2022-08-22 NOTE — Transfer of Care (Signed)
Immediate Anesthesia Transfer of Care Note  Patient: Juan [name]  Procedure(s) Performed: LEFT KNEE ARTHROSCOPY (Left: Knee)  Patient Location: PACU  Anesthesia Type:General  Level of Consciousness: awake, alert , and oriented  Airway & Oxygen Therapy: Patient Spontanous Breathing  Post-op Assessment: Report given to RN and Post -op Vital signs reviewed and stable  Post vital signs: Reviewed and stable  Last Vitals:  Vitals Value Taken Time  BP 114/84 08/22/22 1045  Temp    Pulse 70 08/22/22 1048  Resp 20 08/22/22 1048  SpO2 100 % 08/22/22 1048  Vitals shown include unvalidated device data.  Last Pain:  Vitals:   08/22/22 0634  TempSrc:   PainSc: 0-No pain         Complications: No notable events documented.

## 2022-08-22 NOTE — Anesthesia Procedure Notes (Signed)
Procedure Name: LMA Insertion Date/Time: 08/22/2022 8:27 AM  Performed by: Laruth Bouchard., CRNAPre-anesthesia Checklist: Patient identified, Emergency Drugs available, Suction available, Patient being monitored and Timeout performed Patient Re-evaluated:Patient Re-evaluated prior to induction Oxygen Delivery Method: Circle system utilized Preoxygenation: Pre-oxygenation with 100% oxygen Induction Type: IV induction Ventilation: Mask ventilation without difficulty LMA: LMA inserted LMA Size: 4.0 Number of attempts: 1 Placement Confirmation: positive ETCO2 and breath sounds checked- equal and bilateral Tube secured with: Tape Dental Injury: Teeth and Oropharynx as per pre-operative assessment

## 2022-08-24 NOTE — Anesthesia Postprocedure Evaluation (Signed)
Anesthesia Post Note  Patient: Juan [name]  Procedure(s) Performed: LEFT KNEE ARTHROSCOPY (Left: Knee)     Patient location during evaluation: PACU Anesthesia Type: General Level of consciousness: awake and alert Pain management: pain level controlled Vital Signs Assessment: post-procedure vital signs reviewed and stable Respiratory status: spontaneous breathing, nonlabored ventilation, respiratory function stable and patient connected to nasal cannula oxygen Cardiovascular status: blood pressure returned to baseline and stable Postop Assessment: no apparent nausea or vomiting Anesthetic complications: no  No notable events documented.  Last Vitals:  Vitals:   08/22/22 1115 08/22/22 1130  BP: (!) 130/98 126/84  Pulse: 65 (!) 55  Resp: 14 15  Temp:  36.4 C  SpO2: 98% 98%    Last Pain:  Vitals:   08/22/22 1130  TempSrc:   PainSc: 3                  [name]

## 2022-08-25 ENCOUNTER — Encounter (HOSPITAL_COMMUNITY): Payer: Self-pay | Admitting: Orthopedic Surgery

## 2022-10-06 ENCOUNTER — Emergency Department (HOSPITAL_COMMUNITY): Payer: Medicaid Other

## 2022-10-06 ENCOUNTER — Other Ambulatory Visit: Payer: Self-pay

## 2022-10-06 ENCOUNTER — Emergency Department (HOSPITAL_COMMUNITY)
Admission: EM | Admit: 2022-10-06 | Discharge: 2022-10-06 | Disposition: A | Discharge status: Home / Self Care | Payer: Medicaid Other | Attending: Student | Admitting: Student

## 2022-10-06 ENCOUNTER — Encounter (HOSPITAL_COMMUNITY): Payer: Self-pay | Admitting: *Deleted

## 2022-10-06 DIAGNOSIS — R1011 Right upper quadrant pain: Secondary | ICD-10-CM | POA: Insufficient documentation

## 2022-10-06 DIAGNOSIS — Z79899 Other long term (current) drug therapy: Secondary | ICD-10-CM | POA: Insufficient documentation

## 2022-10-06 DIAGNOSIS — R1013 Epigastric pain: Secondary | ICD-10-CM | POA: Insufficient documentation

## 2022-10-06 DIAGNOSIS — R112 Nausea with vomiting, unspecified: Secondary | ICD-10-CM | POA: Diagnosis not present

## 2022-10-06 DIAGNOSIS — Z87891 Personal history of nicotine dependence: Secondary | ICD-10-CM | POA: Diagnosis not present

## 2022-10-06 DIAGNOSIS — E039 Hypothyroidism, unspecified: Secondary | ICD-10-CM | POA: Insufficient documentation

## 2022-10-06 DIAGNOSIS — R109 Unspecified abdominal pain: Secondary | ICD-10-CM

## 2022-10-06 LAB — CBC WITH DIFFERENTIAL/PLATELET
Abs Immature Granulocytes: 0.01 10*3/uL (ref 0.00–0.07)
Basophils Absolute: 0.1 10*3/uL (ref 0.0–0.1)
Basophils Relative: 1 %
Eosinophils Absolute: 0.1 10*3/uL (ref 0.0–0.5)
Eosinophils Relative: 2 %
HCT: 44 % (ref 39.0–52.0)
Hemoglobin: 14.9 g/dL (ref 13.0–17.0)
Immature Granulocytes: 0 %
Lymphocytes Relative: 42 %
Lymphs Abs: 2.3 10*3/uL (ref 0.7–4.0)
MCH: 30.1 pg (ref 26.0–34.0)
MCHC: 33.9 g/dL (ref 30.0–36.0)
MCV: 88.9 fL (ref 80.0–100.0)
Monocytes Absolute: 0.4 10*3/uL (ref 0.1–1.0)
Monocytes Relative: 7 %
Neutro Abs: 2.7 10*3/uL (ref 1.7–7.7)
Neutrophils Relative %: 48 %
Platelets: 239 10*3/uL (ref 150–400)
RBC: 4.95 MIL/uL (ref 4.22–5.81)
RDW: 13.2 % (ref 11.5–15.5)
WBC: 5.6 10*3/uL (ref 4.0–10.5)
nRBC: 0 % (ref 0.0–0.2)

## 2022-10-06 LAB — COMPREHENSIVE METABOLIC PANEL
ALT: 15 U/L (ref 0–44)
AST: 17 U/L (ref 15–41)
Albumin: 4.6 g/dL (ref 3.5–5.0)
Alkaline Phosphatase: 69 U/L (ref 38–126)
Anion gap: 8 (ref 5–15)
BUN: 12 mg/dL (ref 6–20)
CO2: 28 mmol/L (ref 22–32)
Calcium: 9.7 mg/dL (ref 8.9–10.3)
Chloride: 103 mmol/L (ref 98–111)
Creatinine, Ser: 0.89 mg/dL (ref 0.61–1.24)
GFR, Estimated: >60 mL/min (ref >60)
Glucose, Bld: 98 mg/dL (ref 70–99)
Potassium: 4.1 mmol/L (ref 3.5–5.1)
Sodium: 139 mmol/L (ref 135–145)
Total Bilirubin: 0.9 mg/dL (ref 0.3–1.2)
Total Protein: 8 g/dL (ref 6.5–8.1)

## 2022-10-06 LAB — URINALYSIS, ROUTINE W REFLEX MICROSCOPIC
Bilirubin Urine: NEGATIVE
Glucose, UA: NEGATIVE mg/dL
Hgb urine dipstick: NEGATIVE
Ketones, ur: NEGATIVE mg/dL
Leukocytes,Ua: NEGATIVE
Nitrite: NEGATIVE
Protein, ur: NEGATIVE mg/dL
Specific Gravity, Urine: >1.046 — ABNORMAL HIGH (ref 1.005–1.030)
pH: 7 (ref 5.0–8.0)

## 2022-10-06 LAB — LIPASE, BLOOD: Lipase: 32 U/L (ref 11–51)

## 2022-10-06 MED ORDER — IOHEXOL 300 MG/ML  SOLN
100.0000 mL | Freq: Once | INTRAMUSCULAR | Status: AC | PRN
  Administered 2022-10-06: 100 mL via INTRAVENOUS

## 2022-10-06 NOTE — ED Triage Notes (Signed)
Pt c/o abdominal pain with n/v/d that started this am; pt was told he may have pancreatitis last September but was unable to follow up due to not having insurance  Pt admits to drinking alcohol Friday night at a wedding
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
## 2022-10-18 NOTE — Op Note (Unsigned)
NAME: Juan [name], Juan [name] MEDICAL RECORD NO: [id number] ACCOUNT NO: [id number] DATE OF BIRTH: [birth year] FACILITY: [facility] LOCATION: [facility] PHYSICIAN: Doralee Albino. Carola Frost, MD  Operative Report   DATE OF PROCEDURE: 08/22/2022  LOCATION:  [facility].  PREOPERATIVE DIAGNOSIS:  Loose body, left knee and associated synovitis.  POSTOPERATIVE DIAGNOSES:   1.  Large loose body, left knee. 2.  Chondromalacia of patella.  PROCEDURES:   1.  Arthroscopic removal of loose body, left knee. 2.  Chondroplasty of the patellofemoral compartment.  SURGEON:  Doralee Albino. Carola Frost, MD.  ASSISTANT:  None.  ANESTHESIA:  General.  COMPLICATIONS:  None.  TOURNIQUET:  None.  PATIENT DISPOSITION:  To PACU.  CONDITION:  Stable.    BRIEF SUMMARY OF INDICATIONS FOR PROCEDURE:  The patient is a very pleasant 32 year old male who has had locking, catching and giving way with his left knee.  Plain films showed a loose body, the size of a jawbreaker within the knee, which was new from  prior films.  I discussed with him the risks and benefits of extraction and the possibility of continued chondral injury without doing so particularly given his mechanical symptoms.  He strongly wished to proceed.  Possible risks including recurrence and  failure to alleviate symptoms among the other such as infection, nerve injury, vessel injury, DVT, PE.  BRIEF SUMMARY OF PROCEDURE:  The patient was taken to the operating room where general anesthesia was induced.  The left lower extremity was prepped and draped in the usual sterile fashion using chlorhexidine wash and Betadine scrub and paint.  After  draping, a timeout was held, an inferior lateral and inferomedial portals were established and a full diagnostic arthroscopy performed of the knee.  This revealed extensive synovitis in the anterior compartment and a medial femoral plica with associated  chondral injury to the edge of the medial femoral condyle.  Pictures were taken of  that for documentation.  The articular surfaces were well preserved on the trochlea, medial compartment, both the femoral and tibial surfaces.  The medial meniscus was  intact.  Within the notch, there was a very large 2.5 x 2.5 loose body, which did have some synovial attachment to it, but which could displace into the joint.  I was able to grab this with a grasper and then withdraw it from the joint, but I did have to  make an extensile incision of 2-1/2 cm in order to work it through the soft tissues and out of the knee.  I did perform a chondroplasty of the patella and I used the arthroscopic wand to assist with removal of all the debris.  It was difficult to  identify source of this loose body, it may have come from within the tibia perhaps during nail placement or nail extraction or it could have been what was scraped off of the medial femoral condyle by the plica, which was resected as well using first a  biter and then the arthroscopic shaver.  Wound was irrigated thoroughly.  Portals closed with 2-0 nylon.  Patient taken to the PACU in stable condition.  PROGNOSIS:  The patient should do well following removal of this loose body and a chondroplasty of the patella.    He can perform graduated return to a high revolution low-impact strengthening program.  I will see him back in the office in 10 days for removal of sutures.   CHR D: 10/18/2022 2:30:48 pm T: 10/18/2022 4:47:00 pm  JOB: [id number]/ [id number]

## 2022-11-04 ENCOUNTER — Ambulatory Visit: Payer: Medicaid Other | Admitting: Gastroenterology

## 2022-11-11 ENCOUNTER — Encounter: Payer: Self-pay | Admitting: Gastroenterology
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
## 2022-12-04 ENCOUNTER — Ambulatory Visit: Payer: Medicaid Other | Admitting: Gastroenterology

## 2022-12-08 ENCOUNTER — Encounter: Payer: Self-pay | Admitting: Gastroenterology

## 2023-01-04 ENCOUNTER — Encounter: Payer: Self-pay | Admitting: Nurse Practitioner

## 2023-01-04 ENCOUNTER — Telehealth: Payer: Medicaid Other | Admitting: Nurse Practitioner

## 2023-01-04 DIAGNOSIS — K529 Noninfective gastroenteritis and colitis, unspecified: Secondary | ICD-10-CM

## 2023-01-04 MED ORDER — LOPERAMIDE HCL 2 MG PO TABS
2.0000 mg | ORAL_TABLET | Freq: Three times a day (TID) | ORAL | 0 refills | Status: DC | PRN
Start: 2023-01-04 — End: 2023-01-06

## 2023-01-04 MED ORDER — ONDANSETRON HCL 4 MG PO TABS: 4.0000 mg | ORAL_TABLET | Freq: Three times a day (TID) | ORAL | 0 refills | Status: DC | PRN
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
## 2023-01-04 NOTE — Patient Instructions (Signed)
  Pascal Lux, thank you for joining Claiborne Rigg, NP for today's virtual visit.  While this provider is not your primary care provider (PCP), if your PCP is located in our provider database this encounter information will be shared with them immediately following your visit.   A [facility] MyChart account gives you access to today's visit and all your visits, tests, and labs performed at [facility] " click here if you don't have a [facility] MyChart account or go to [email or web address]  Consent: (Patient) Juan [name] provided verbal consent for this virtual visit at the beginning of the encounter.  Current Medications:  Current Outpatient Medications:    loperamide (IMODIUM A-D) 2 MG tablet, Take 1 tablet (2 mg total) by mouth 3 (three) times daily as needed for diarrhea or loose stools., Disp: 30 tablet, Rfl: 0   ondansetron (ZOFRAN) 4 MG tablet, Take 1 tablet (4 mg total) by mouth every 8 (eight) hours as needed for nausea or vomiting., Disp: 21 tablet, Rfl: 0   Medications ordered in this encounter:  Meds ordered this encounter  Medications   loperamide (IMODIUM A-D) 2 MG tablet    Sig: Take 1 tablet (2 mg total) by mouth 3 (three) times daily as needed for diarrhea or loose stools.    Dispense:  30 tablet    Refill:  0    Order Specific Question:   Supervising Provider    Answer:   LAMPTEY, PHILIP O [[id number]]   ondansetron (ZOFRAN) 4 MG tablet    Sig: Take 1 tablet (4 mg total) by mouth every 8 (eight) hours as needed for nausea or vomiting.    Dispense:  21 tablet    Refill:  0    Order Specific Question:   Supervising Provider    Answer:   Merrilee Jansky [id number]     *If you need refills on other medications prior to your next appointment, please contact your pharmacy*  Follow-Up: Call back or seek an in-person evaluation if the symptoms worsen or if the condition fails to improve as anticipated.  [facility] [phone]  Other  Instructions Avoid all alcohol intake   If you have been instructed to have an in-person evaluation today at a local Urgent Care facility, please use the link below. It will take you to a list of all of our available [facility], including address, phone number and hours of operation. Please do not delay care.  [facility]  If you or a family member do not have a primary care provider, use the link below to schedule a visit and establish care. When you choose a [facility] primary care physician or advanced practice provider, you gain a long-term partner in health. Find a Primary Care Provider  Learn more about [facility] in-office and virtual care options:  - Get Care Now

## 2023-01-05 NOTE — Progress Notes (Signed)
GI Office Note    Referring Provider: [facility] Primary Care Physician:  [facility]  Primary Gastroenterologist: Roetta Sessions, MD   Chief Complaint   Chief Complaint  Patient presents with   Pancreatitis    Was vomiting and diarrhea. Feels better today.     History of Present Illness   Juan [name] is a 32 y.o. male presenting today for follow up. Last seen in 02/2022. History of abdominal pain, vomiting, and diarrhea.   Previous work up negative for [employer] and GI pathogen panel. Celiac serologies negative. Colonoscopy 07/2020 with normal ileocolonoscopy, negative random colon biopsies. Some improvement on bentyl in the past. Lipase 480 in 08/2021. Denies history of heavy etoh use, rarely consumes etoh. H/o marijuana use daily. Did not complete RUQ U/S as planned after last ov.  Seen in the ED back in May 2024 with similar symptoms.  Labs and CT negative at that time.  Patient presents for recent onset nausea/vomiting/abdominal pain and diarrhea.  Completed virtual visit Sunday with Bertram Denver, NP. He reported consuming few beers the night before and had been experiencing vomiting and diarrhea. Reported that he does not drink daily. Given loperamide and zofran.  Patient states that he continues to have ongoing episodes a few times per month, some months he does not have any episodes.  He generally gets woken up from sleep with acute onset projectile vomiting and diarrhea that lasts throughout the morning.  Typically better by the afternoon.  This last episode occurred after drinking 2 beers (12 ounce size) over the weekend.  States he does not consume alcohol on a regular basis and typically does not drink heavily.  This last episode lasted a little bit longer.  At this point his vomiting has subsided.  He had brown emesis but does not describe hematemesis or coffee-ground emesis.  Denies any melena or rectal bleeding.  Complains of abdominal pain,  generalized in the upper abdomen.  He is feeling better today but not back 100%.  When the episodes occur he generally takes hot baths but he states he really does not help.  He is unable to determine triggering cause.  Other episodes have occurred without alcohol consumption.  This was his first alcohol consumption in the past 6 months although while in the ED 09/2022 he reported he had consumed small amt of etoh at a wedding prior to that episode. He does have frequent heartburn which he takes over-the-counter Tums and sometimes Prilosec OTC.  Denies any dysphagia.  He takes ibuprofen 400 mg 3-4 times per week but no aspirin use.  Smokes marijuana almost every day.    Medications   Current Outpatient Medications  Medication Sig Dispense Refill   cholecalciferol (VITAMIN D3) 25 MCG (1000 UNIT) tablet Take 1,000 Units by mouth daily.     dicyclomine (BENTYL) 10 MG capsule Take 10 mg by mouth 2 (two) times daily.     sertraline (ZOLOFT) 25 MG tablet Take 25 mg by mouth daily.     No current facility-administered medications for this visit.    Allergies   Allergies as of 01/06/2023   (No Known Allergies)       Review of Systems   General: Negative for anorexia, weight loss, fever, chills, fatigue, weakness. ENT: Negative for hoarseness, difficulty swallowing , nasal congestion. CV: Negative for chest pain, angina, palpitations, dyspnea on exertion, peripheral edema.  Respiratory: Negative for dyspnea at rest, dyspnea on exertion, cough, sputum, wheezing.  GI: See  history of present illness. GU:  Negative for dysuria, hematuria, urinary incontinence, urinary frequency, nocturnal urination.  Endo: Negative for unusual weight change.     Physical Exam   BP 118/80 (BP Location: Right Arm, Patient Position: Sitting, Cuff Size: Normal)   Pulse 65   Temp 97.7 F (36.5 C) (Temporal)   Ht 5\' 6"  (1.676 m)   Wt 184 lb 12.8 oz (83.8 kg)   SpO2 98%   BMI 29.83 kg/m    General:  Well-nourished, well-developed in no acute distress.  Eyes: No icterus. Mouth: Oropharyngeal mucosa moist and pink   Lungs: Clear to auscultation bilaterally.  Heart: Regular rate and rhythm, no murmurs rubs or gallops.  Abdomen: Bowel sounds are normal,  nondistended, no hepatosplenomegaly or masses,  no abdominal bruits or hernia , no rebound or guarding. Mild epigastric tenderness Rectal: not performed  Extremities: No lower extremity edema. No clubbing or deformities. Neuro: Alert and oriented x 4   Skin: Warm and dry, no jaundice.   Psych: Alert and cooperative, normal mood and affect.  Labs   Lab Results  Component Value Date   NA 139 10/06/2022   CL 103 10/06/2022   K 4.1 10/06/2022   CO2 28 10/06/2022   BUN 12 10/06/2022   CREATININE 0.89 10/06/2022   GFRNONAA >60 10/06/2022   CALCIUM 9.7 10/06/2022   PHOS 3.5 01/14/2015   ALBUMIN 4.6 10/06/2022   GLUCOSE 98 10/06/2022   Lab Results  Component Value Date   ALT 15 10/06/2022   AST 17 10/06/2022   ALKPHOS 69 10/06/2022   BILITOT 0.9 10/06/2022   Lab Results  Component Value Date   WBC 5.6 10/06/2022   HGB 14.9 10/06/2022   HCT 44.0 10/06/2022   MCV 88.9 10/06/2022   PLT 239 10/06/2022   Lab Results  Component Value Date   LIPASE 32 10/06/2022    Imaging Studies   No results found.  Assessment   *GERD *N/V *Diarrhea *Upper abdominal pain   Etiology unclear. Cannot rule out episode of pancreatitis in the past, lipase in the 400s. He has had several of these flares of abdominal pain with N/V/D occurring after reported modest etoh use. ?possibility of biliary etiology,  GERD/gastritis/PUD, and Cannabinoid hyperemesis syndrome remains possibility as well. Would recommend gallbladder work up. If negative, then consider upper endoscopy.   PLAN   Start pantoprazole 40mg  daily before breakfast. With next episode of abdominal pain, he will go to lab 6-8 hours after onset of pain. Ruq u/s.  Recommend  avoid etoh and marijuana at this time.   Leanna Battles. Melvyn Neth, MHS, PA-C [facility]

## 2023-01-06 ENCOUNTER — Ambulatory Visit (INDEPENDENT_AMBULATORY_CARE_PROVIDER_SITE_OTHER): Payer: Medicaid Other | Admitting: Gastroenterology

## 2023-01-06 ENCOUNTER — Encounter: Payer: Self-pay | Admitting: *Deleted

## 2023-01-06 ENCOUNTER — Encounter: Payer: Self-pay | Admitting: Gastroenterology

## 2023-01-06 VITALS — BP 118/80 | HR 65 | Temp 97.7°F | Ht 66.0 in | Wt 184.8 lb

## 2023-01-06 DIAGNOSIS — R1010 Upper abdominal pain, unspecified: Secondary | ICD-10-CM | POA: Diagnosis not present

## 2023-01-06 DIAGNOSIS — K219 Gastro-esophageal reflux disease without esophagitis: Secondary | ICD-10-CM | POA: Diagnosis not present

## 2023-01-06 DIAGNOSIS — R112 Nausea with vomiting, unspecified: Secondary | ICD-10-CM

## 2023-01-06 DIAGNOSIS — R197 Diarrhea, unspecified: Secondary | ICD-10-CM | POA: Diagnosis not present

## 2023-01-06 HISTORY — DX: Nausea with vomiting, unspecified: R11.2

## 2023-01-06 MED ORDER — PANTOPRAZOLE SODIUM 40 MG PO TBEC: 40.0000 mg | DELAYED_RELEASE_TABLET | Freq: Every day | ORAL | 5 refills | Status: AC

## 2023-01-06 NOTE — Patient Instructions (Signed)
Start pantoprazole 40mg  daily before breakfast. You can still use TUMS if needed. With next episode of abdominal pain, go to [facility] around 6-8 hours after onset of symptoms.  Gallbladder ultrasound as scheduled.  Recommend alcohol avoidance for now. Recommend reducing marijuana use due to risk of cannabinoid hyperemesis syndrome. See handout.

## 2023-01-13 ENCOUNTER — Ambulatory Visit (HOSPITAL_COMMUNITY): Admission: RE | Admit: 2023-01-13 | Payer: Medicaid Other | Source: Ambulatory Visit

## 2023-09-15 LAB — HEPATIC FUNCTION PANEL
ALT: 16 IU/L (ref 0–44)
AST: 14 IU/L (ref 0–40)
Albumin: 4.5 g/dL (ref 4.1–5.1)
Alkaline Phosphatase: 73 IU/L (ref 44–121)
Bilirubin Total: 0.2 mg/dL (ref 0.0–1.2)
Bilirubin, Direct: 0.09 mg/dL (ref 0.00–0.40)
Total Protein: 6.8 g/dL (ref 6.0–8.5)

## 2023-09-15 LAB — LIPASE: Lipase: 46 U/L (ref 13–78)

## 2023-09-16 ENCOUNTER — Other Ambulatory Visit: Payer: Self-pay | Admitting: *Deleted

## 2023-09-16 DIAGNOSIS — R1010 Upper abdominal pain, unspecified: Secondary | ICD-10-CM

## 2023-09-16 DIAGNOSIS — R112 Nausea with vomiting, unspecified: Secondary | ICD-10-CM

## 2023-09-26 ENCOUNTER — Ambulatory Visit (HOSPITAL_COMMUNITY)
Admission: RE | Admit: 2023-09-26 | Discharge: 2023-09-26 | Disposition: A | Discharge status: Home / Self Care | Source: Ambulatory Visit | Attending: Gastroenterology | Admitting: Gastroenterology

## 2023-09-26 DIAGNOSIS — R112 Nausea with vomiting, unspecified: Secondary | ICD-10-CM | POA: Diagnosis present

## 2023-09-26 DIAGNOSIS — R1010 Upper abdominal pain, unspecified: Secondary | ICD-10-CM | POA: Diagnosis present

## 2023-09-29 ENCOUNTER — Ambulatory Visit (HOSPITAL_COMMUNITY)

## 2024-01-09 ENCOUNTER — Other Ambulatory Visit: Payer: Self-pay | Admitting: Nurse Practitioner

## 2024-01-09 DIAGNOSIS — K529 Noninfective gastroenteritis and colitis, unspecified: Secondary | ICD-10-CM

## 2024-01-11 NOTE — Telephone Encounter (Signed)
 Requested medication (s) are due for refill today: routing for review  Requested medication (s) are on the active medication list: yes  Last refill:  01/04/23  Future visit scheduled: no  Notes to clinic:  Unable to refill per protocol, cannot delegate.      Requested Prescriptions  Pending Prescriptions Disp Refills   ondansetron  (ZOFRAN ) 4 MG tablet [Pharmacy Med Name: ONDANSETRON  4MG  TABLETS] 21 tablet 0    Sig: TAKE 1 TABLET(4 MG) BY MOUTH EVERY 8 HOURS AS NEEDED FOR NAUSEA OR VOMITING     Not Delegated - Gastroenterology: Antiemetics - ondansetron  Failed - 01/11/2024  3:35 PM      Failed - This refill cannot be delegated      Failed - Valid encounter within last 6 months    Recent Outpatient Visits   None            Passed - AST in normal range and within 360 days    AST  Date Value Ref Range Status  09/14/2023 14 0 - 40 IU/L Final         Passed - ALT in normal range and within 360 days    ALT  Date Value Ref Range Status  09/14/2023 16 0 - 44 IU/L Final
